# Patient Record
Sex: Female | Born: 1975
Health system: Southern US, Community
[De-identification: ages and names within clinical notes are randomized; demographics above are authoritative.]

## PROBLEM LIST (undated history)

## (undated) DIAGNOSIS — H539 Unspecified visual disturbance: Secondary | ICD-10-CM

## (undated) DIAGNOSIS — F419 Anxiety disorder, unspecified: Secondary | ICD-10-CM

## (undated) DIAGNOSIS — R519 Headache, unspecified: Secondary | ICD-10-CM

## (undated) DIAGNOSIS — G032 Benign recurrent meningitis [Mollaret]: Secondary | ICD-10-CM

## (undated) DIAGNOSIS — T7840XA Allergy, unspecified, initial encounter: Secondary | ICD-10-CM

## (undated) DIAGNOSIS — Z87898 Personal history of other specified conditions: Secondary | ICD-10-CM

## (undated) DIAGNOSIS — G8929 Other chronic pain: Secondary | ICD-10-CM

## (undated) DIAGNOSIS — S022XXA Fracture of nasal bones, initial encounter for closed fracture: Secondary | ICD-10-CM

## (undated) DIAGNOSIS — R51 Headache: Principal | ICD-10-CM

## (undated) DIAGNOSIS — D649 Anemia, unspecified: Secondary | ICD-10-CM

## (undated) DIAGNOSIS — A879 Viral meningitis, unspecified: Secondary | ICD-10-CM

## (undated) DIAGNOSIS — N83209 Unspecified ovarian cyst, unspecified side: Secondary | ICD-10-CM

## (undated) DIAGNOSIS — M1990 Unspecified osteoarthritis, unspecified site: Secondary | ICD-10-CM

## (undated) HISTORY — DX: Headache, unspecified: R51.9

## (undated) HISTORY — DX: Fracture of nasal bones, initial encounter for closed fracture: S02.2XXA

## (undated) HISTORY — DX: Unspecified osteoarthritis, unspecified site: M19.90

## (undated) HISTORY — DX: Unspecified ovarian cyst, unspecified side: N83.209

## (undated) HISTORY — PX: MOUTH SURGERY: SHX715

## (undated) HISTORY — PX: UTERINE ARTERY EMBOLIZATION: SHX2629

## (undated) HISTORY — DX: Anemia, unspecified: D64.9

## (undated) HISTORY — DX: Viral meningitis, unspecified: A87.9

## (undated) HISTORY — DX: Other chronic pain: G89.29

## (undated) HISTORY — PX: FOOT SURGERY: SHX648

## (undated) HISTORY — DX: Allergy, unspecified, initial encounter: T78.40XA

## (undated) HISTORY — DX: Anxiety disorder, unspecified: F41.9

## (undated) HISTORY — DX: Personal history of other specified conditions: Z87.898

## (undated) HISTORY — PX: SPINE SURGERY: SHX786

## (undated) HISTORY — DX: Headache: R51

## (undated) HISTORY — DX: Unspecified visual disturbance: H53.9

---
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
## 1996-04-03 HISTORY — PX: LAPAROSCOPY FOR ECTOPIC PREGNANCY: SUR765

## 1998-09-06 ENCOUNTER — Emergency Department (HOSPITAL_COMMUNITY): Admission: EM | Admit: 1998-09-06 | Discharge: 1998-09-06 | Payer: Self-pay | Admitting: Emergency Medicine

## 1998-12-08 ENCOUNTER — Emergency Department (HOSPITAL_COMMUNITY): Admission: EM | Admit: 1998-12-08 | Discharge: 1998-12-08 | Payer: Self-pay | Admitting: Emergency Medicine

## 1998-12-08 ENCOUNTER — Encounter: Payer: Self-pay | Admitting: Emergency Medicine

## 2000-01-18 ENCOUNTER — Other Ambulatory Visit: Admission: RE | Admit: 2000-01-18 | Discharge: 2000-01-18 | Payer: Self-pay | Admitting: *Deleted

## 2000-01-18 ENCOUNTER — Other Ambulatory Visit: Admission: RE | Admit: 2000-01-18 | Discharge: 2000-01-18 | Payer: Self-pay | Admitting: Family Medicine

## 2000-02-10 ENCOUNTER — Other Ambulatory Visit: Admission: RE | Admit: 2000-02-10 | Discharge: 2000-02-10 | Payer: Self-pay | Admitting: Family Medicine

## 2000-09-28 ENCOUNTER — Inpatient Hospital Stay (HOSPITAL_COMMUNITY): Admission: EM | Admit: 2000-09-28 | Discharge: 2000-10-03 | Payer: Self-pay | Admitting: Emergency Medicine

## 2000-09-28 ENCOUNTER — Encounter: Payer: Self-pay | Admitting: Emergency Medicine

## 2001-01-29 ENCOUNTER — Other Ambulatory Visit: Admission: RE | Admit: 2001-01-29 | Discharge: 2001-01-29 | Payer: Self-pay | Admitting: *Deleted

## 2001-05-26 ENCOUNTER — Emergency Department (HOSPITAL_COMMUNITY): Admission: EM | Admit: 2001-05-26 | Discharge: 2001-05-26 | Payer: Self-pay | Admitting: Emergency Medicine

## 2001-05-26 ENCOUNTER — Encounter: Payer: Self-pay | Admitting: Emergency Medicine

## 2002-09-02 ENCOUNTER — Other Ambulatory Visit: Admission: RE | Admit: 2002-09-02 | Discharge: 2002-09-02 | Payer: Self-pay | Admitting: *Deleted

## 2003-11-22 ENCOUNTER — Emergency Department (HOSPITAL_COMMUNITY): Admission: EM | Admit: 2003-11-22 | Discharge: 2003-11-22 | Payer: Self-pay | Admitting: Emergency Medicine

## 2003-12-15 ENCOUNTER — Other Ambulatory Visit: Admission: RE | Admit: 2003-12-15 | Discharge: 2003-12-15 | Payer: Self-pay | Admitting: *Deleted

## 2004-03-21 ENCOUNTER — Other Ambulatory Visit: Admission: RE | Admit: 2004-03-21 | Discharge: 2004-03-21 | Payer: Self-pay | Admitting: *Deleted

## 2004-05-26 ENCOUNTER — Emergency Department (HOSPITAL_COMMUNITY): Admission: EM | Admit: 2004-05-26 | Discharge: 2004-05-26 | Payer: Self-pay | Admitting: Emergency Medicine

## 2004-10-24 ENCOUNTER — Other Ambulatory Visit: Admission: RE | Admit: 2004-10-24 | Discharge: 2004-10-24 | Payer: Self-pay | Admitting: *Deleted

## 2006-11-07 ENCOUNTER — Other Ambulatory Visit: Admission: RE | Admit: 2006-11-07 | Discharge: 2006-11-07 | Payer: Self-pay | Admitting: *Deleted

## 2006-12-06 ENCOUNTER — Inpatient Hospital Stay (HOSPITAL_COMMUNITY): Admission: AD | Admit: 2006-12-06 | Discharge: 2006-12-06 | Payer: Self-pay | Admitting: *Deleted

## 2006-12-09 ENCOUNTER — Inpatient Hospital Stay (HOSPITAL_COMMUNITY): Admission: AD | Admit: 2006-12-09 | Discharge: 2006-12-09 | Payer: Self-pay | Admitting: *Deleted

## 2006-12-12 ENCOUNTER — Inpatient Hospital Stay (HOSPITAL_COMMUNITY): Admission: AD | Admit: 2006-12-12 | Discharge: 2006-12-12 | Payer: Self-pay | Admitting: *Deleted

## 2006-12-18 ENCOUNTER — Inpatient Hospital Stay (HOSPITAL_COMMUNITY): Admission: AD | Admit: 2006-12-18 | Discharge: 2006-12-18 | Payer: Self-pay | Admitting: *Deleted

## 2006-12-25 ENCOUNTER — Inpatient Hospital Stay (HOSPITAL_COMMUNITY): Admission: AD | Admit: 2006-12-25 | Discharge: 2006-12-25 | Payer: Self-pay | Admitting: *Deleted

## 2007-01-01 ENCOUNTER — Inpatient Hospital Stay (HOSPITAL_COMMUNITY): Admission: AD | Admit: 2007-01-01 | Discharge: 2007-01-01 | Payer: Self-pay | Admitting: *Deleted

## 2007-01-08 ENCOUNTER — Inpatient Hospital Stay (HOSPITAL_COMMUNITY): Admission: AD | Admit: 2007-01-08 | Discharge: 2007-01-08 | Payer: Self-pay | Admitting: *Deleted

## 2007-01-15 ENCOUNTER — Inpatient Hospital Stay (HOSPITAL_COMMUNITY): Admission: AD | Admit: 2007-01-15 | Discharge: 2007-01-15 | Payer: Self-pay | Admitting: *Deleted

## 2007-11-25 ENCOUNTER — Other Ambulatory Visit: Admission: RE | Admit: 2007-11-25 | Discharge: 2007-11-25 | Payer: Self-pay | Admitting: Gynecology

## 2008-02-04 ENCOUNTER — Encounter: Admission: RE | Admit: 2008-02-04 | Discharge: 2008-02-04 | Payer: Self-pay | Admitting: Obstetrics and Gynecology

## 2008-02-07 ENCOUNTER — Encounter: Admission: RE | Admit: 2008-02-07 | Discharge: 2008-02-07 | Payer: Self-pay | Admitting: Interventional Radiology

## 2008-02-24 ENCOUNTER — Ambulatory Visit (HOSPITAL_COMMUNITY): Admission: RE | Admit: 2008-02-24 | Discharge: 2008-02-25 | Payer: Self-pay | Admitting: Interventional Radiology

## 2008-03-03 ENCOUNTER — Encounter: Admission: RE | Admit: 2008-03-03 | Discharge: 2008-03-03 | Payer: Self-pay | Admitting: Interventional Radiology

## 2008-07-24 ENCOUNTER — Encounter: Admission: RE | Admit: 2008-07-24 | Discharge: 2008-07-24 | Payer: Self-pay | Admitting: Gynecology

## 2008-09-01 ENCOUNTER — Encounter: Admission: RE | Admit: 2008-09-01 | Discharge: 2008-09-01 | Payer: Self-pay | Admitting: Obstetrics and Gynecology

## 2008-09-01 ENCOUNTER — Encounter: Admission: RE | Admit: 2008-09-01 | Discharge: 2008-09-01 | Payer: Self-pay | Admitting: Interventional Radiology

## 2008-09-27 ENCOUNTER — Inpatient Hospital Stay (HOSPITAL_COMMUNITY): Admission: EM | Admit: 2008-09-27 | Discharge: 2008-09-30 | Payer: Self-pay | Admitting: Emergency Medicine

## 2008-09-28 ENCOUNTER — Ambulatory Visit: Payer: Self-pay | Admitting: Infectious Disease

## 2008-10-09 ENCOUNTER — Encounter: Admission: RE | Admit: 2008-10-09 | Discharge: 2008-10-09 | Payer: Self-pay | Admitting: Family Medicine

## 2009-01-01 ENCOUNTER — Encounter: Admission: RE | Admit: 2009-01-01 | Discharge: 2009-01-01 | Payer: Self-pay | Admitting: Gynecology

## 2009-04-03 DIAGNOSIS — S022XXA Fracture of nasal bones, initial encounter for closed fracture: Secondary | ICD-10-CM

## 2009-04-03 HISTORY — DX: Fracture of nasal bones, initial encounter for closed fracture: S02.2XXA

## 2009-09-08 ENCOUNTER — Ambulatory Visit (HOSPITAL_COMMUNITY): Admission: RE | Admit: 2009-09-08 | Discharge: 2009-09-08 | Payer: Self-pay | Admitting: *Deleted

## 2009-09-17 ENCOUNTER — Encounter: Admission: RE | Admit: 2009-09-17 | Discharge: 2009-09-17 | Payer: Self-pay | Admitting: Family Medicine

## 2010-04-24 ENCOUNTER — Encounter: Payer: Self-pay | Admitting: Interventional Radiology

## 2010-05-13 IMAGING — CT CT ABDOMEN W/ CM
2 of 4 series · 17 of 46 positions shown, 19 images · IV contrast (CONTRAST)
Comparison: None

CT ABDOMEN

CLINICAL DATA: Right lower quadrant pain for 4 days, fever

CT ABDOMEN AND PELVIS WITH CONTRAST
TECHNIQUE: Multidetector CT imaging of the abdomen and pelvis was
performed using the standard protocol following bolus
administration of intravenous contrast.
Contrast: 100 ml Smnipaque-RBB

[Series 2: portal · axial · portal-venous · 0.68mm/px · z∈[+1070,+1424]mm · 14 of 79 slices shown, 16 images]
[im 4/79  soft-tissue]
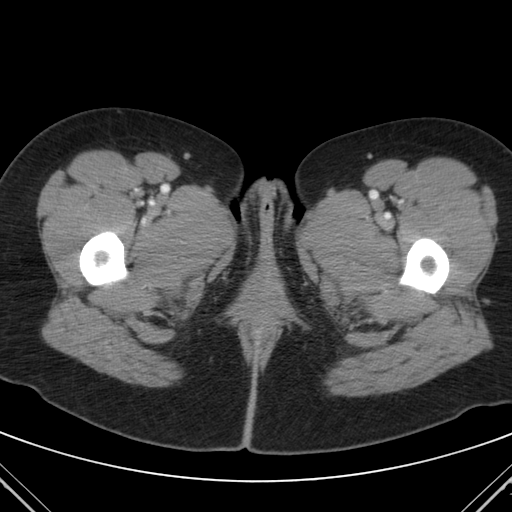
[im 4/79  bone]
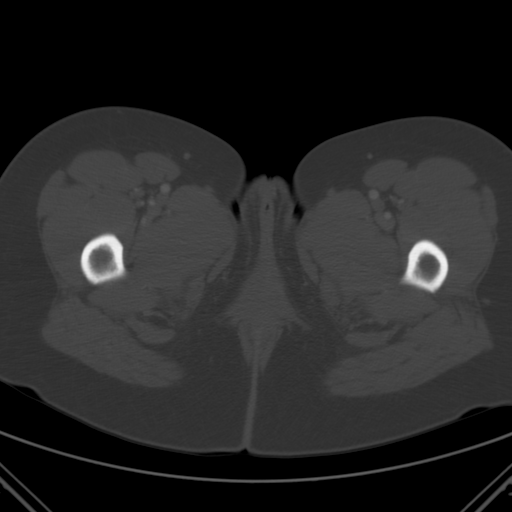
[im 10/79  soft-tissue]
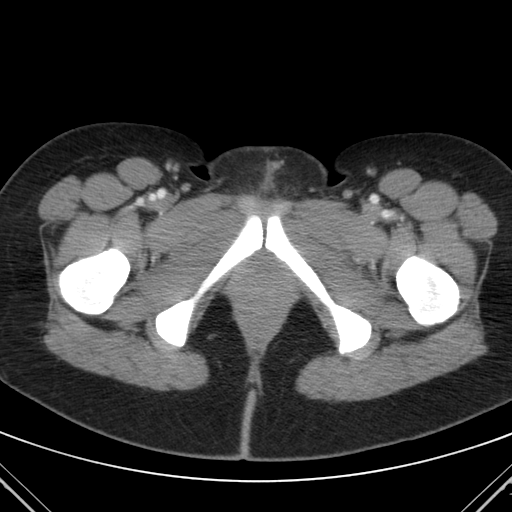
[im 16/79  soft-tissue]
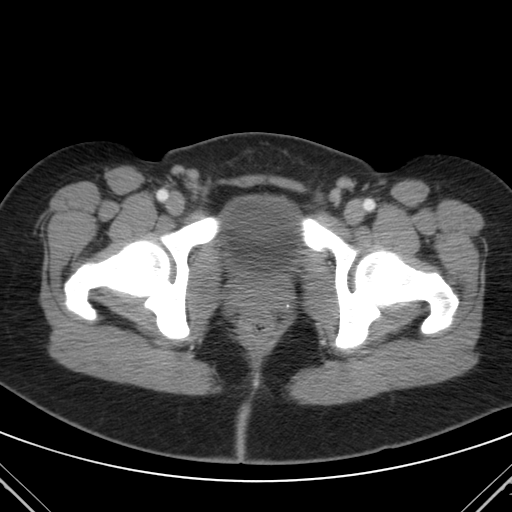
[im 22/79  soft-tissue]
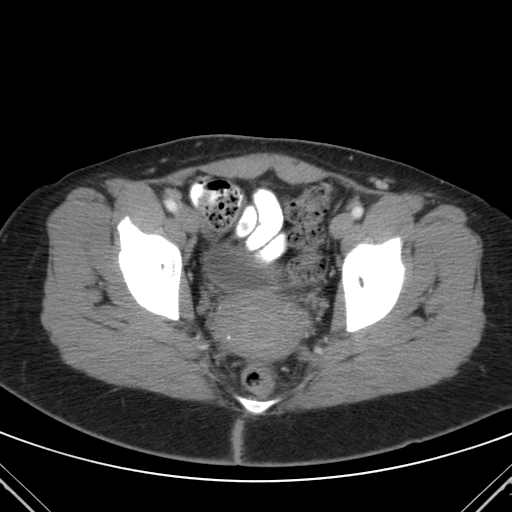
[im 25/79  soft-tissue]
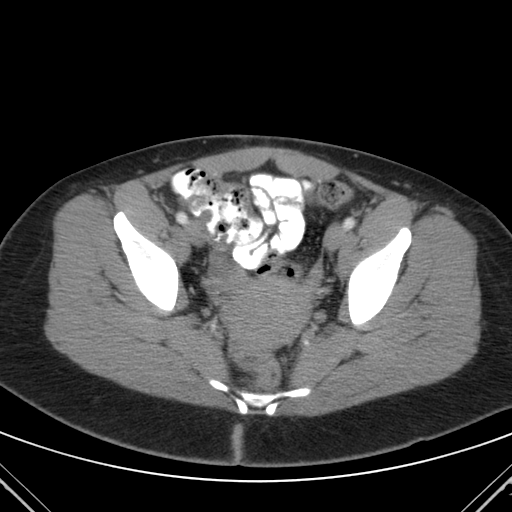
[im 32/79  soft-tissue]
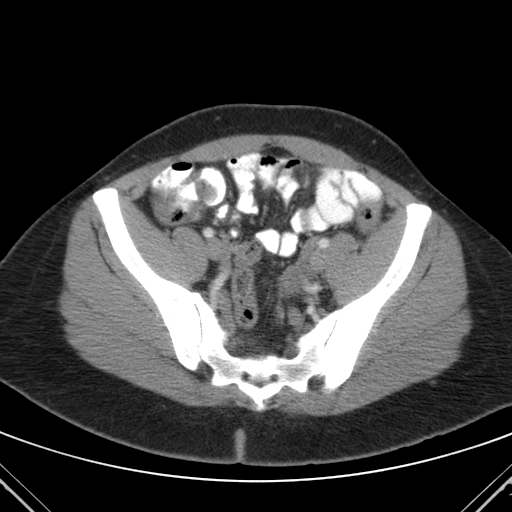
[im 38/79  soft-tissue]
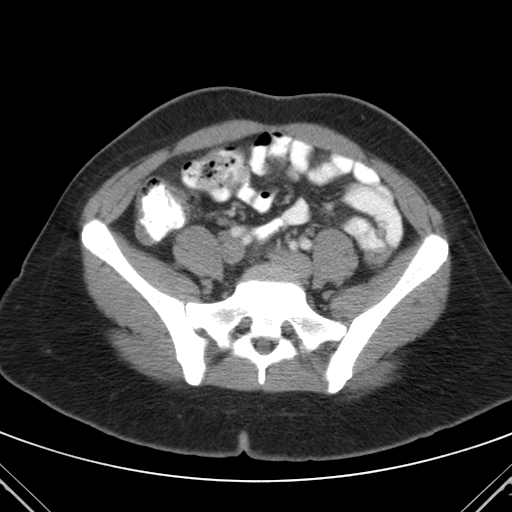
[im 41/79  soft-tissue]
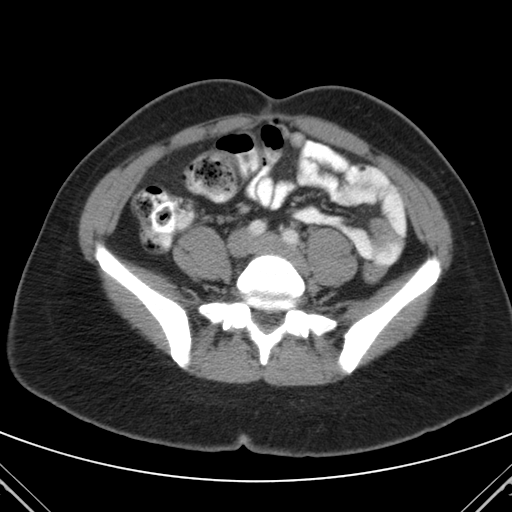
[im 47/79  soft-tissue]
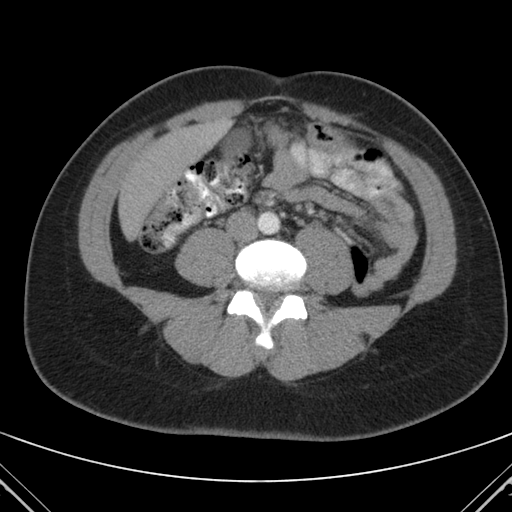
[im 47/79  bone]
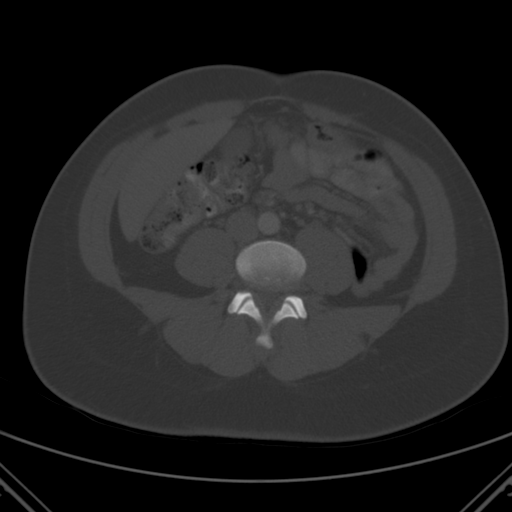
[im 54/79  soft-tissue]
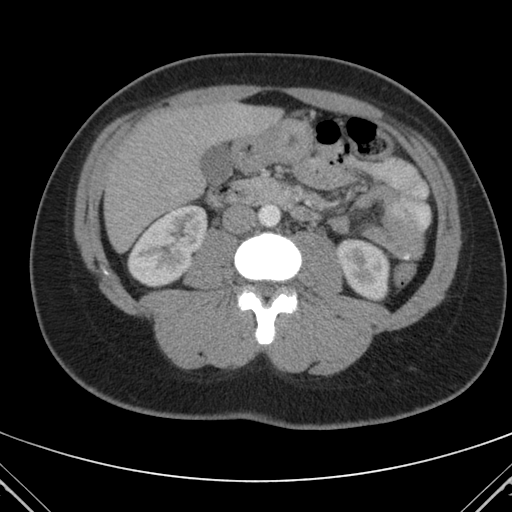
[im 60/79  soft-tissue]
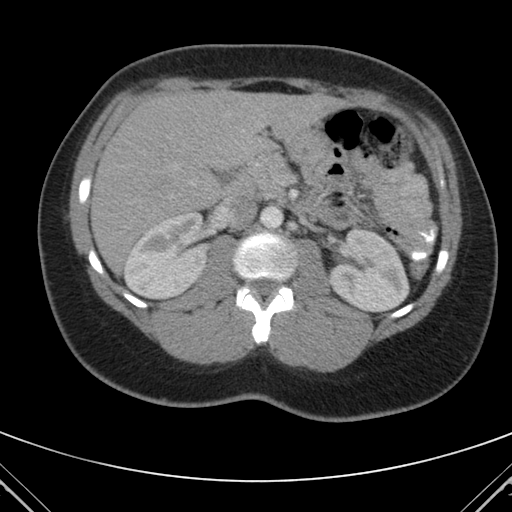
[im 63/79  soft-tissue]
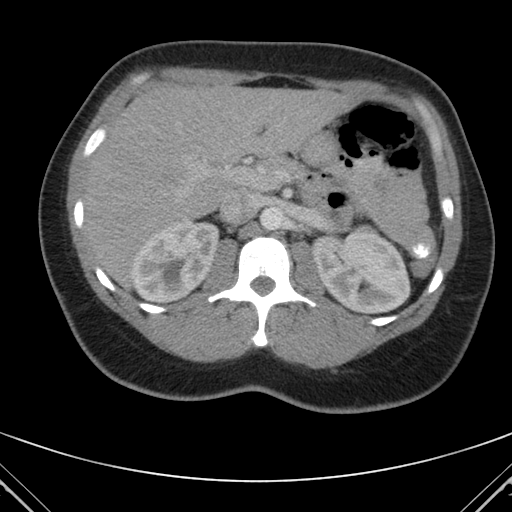
[im 69/79  soft-tissue]
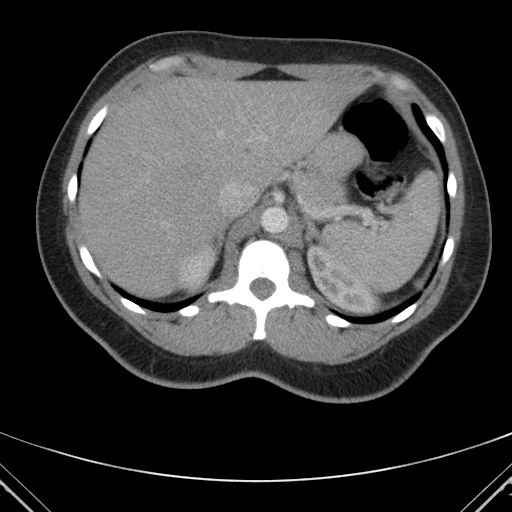
[im 75/79  soft-tissue]
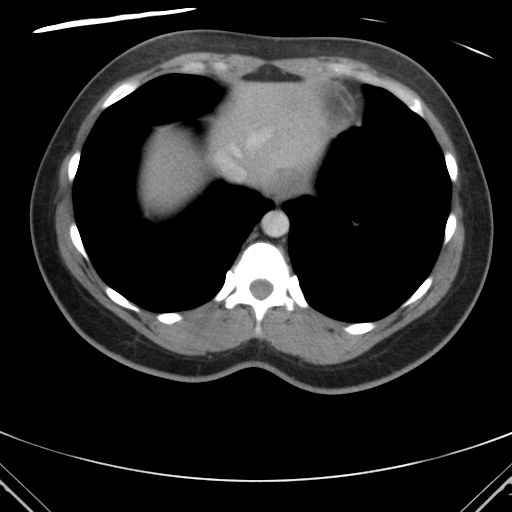

[coronals · coronal · 0.76mm/px · 3 of 83 slices shown]
[im 28/83  soft-tissue]
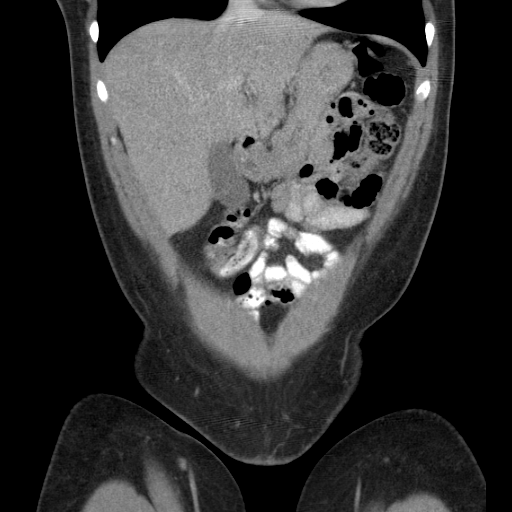
[im 37/83  soft-tissue]
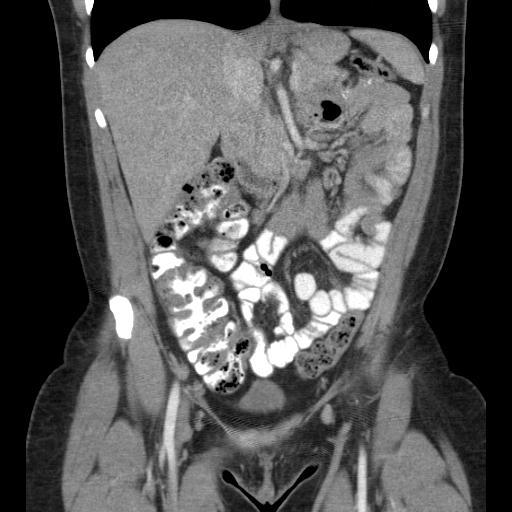
[im 46/83  soft-tissue]
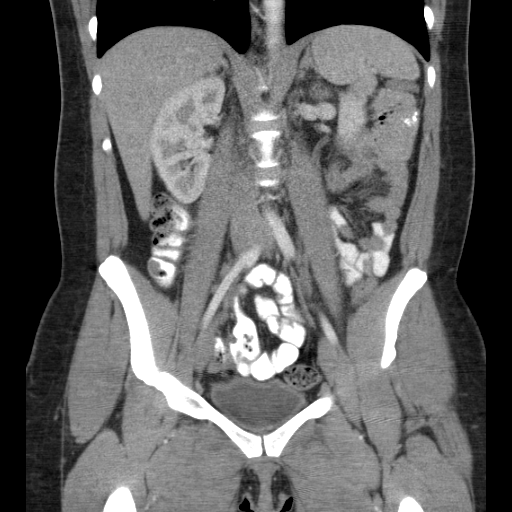

[17 of 46 positions shown; findings below may reference images not displayed]

FINDINGS: The lung bases are clear.  The liver enhances with no
focal abnormality and no ductal dilatation is seen.  No calcified
gallstones are noted.  The pancreas is normal in size and the
pancreatic duct is not dilated.  The adrenal glands and spleen
appear normal.  The kidneys enhance with no evidence of calculi,
mass, or hydronephrosis.  The abdominal aorta is normal in caliber.
The stomach is not distended and cannot be evaluated.
IMPRESSION: No significant abnormality on CT of the abdomen.

CT PELVIS
FINDINGS: The urinary bladder is unremarkable.  The uterus is
normal in size.  Small ovarian cysts are noted.  No free fluid is
seen within the pelvis.  The appendix fills with air and some
contrast and there is no CT evidence of appendicitis.  The terminal
ileum appears grossly normal. No bony abnormality is noted.
IMPRESSION: The appendix is well seen and appears normal.  The terminal ileum
is normal.  No acute abnormality is seen.  There are bilateral
ovarian cysts of no larger than 3 cm.

## 2010-07-11 LAB — URINE CULTURE
Colony Count: NO GROWTH
Culture: NO GROWTH

## 2010-07-11 LAB — CBC
HCT: 37.9 % (ref 36.0–46.0)
Hemoglobin: 11.8 g/dL — ABNORMAL LOW (ref 12.0–15.0)
Hemoglobin: 12.6 g/dL (ref 12.0–15.0)
MCHC: 33.2 g/dL (ref 30.0–36.0)
MCHC: 33.3 g/dL (ref 30.0–36.0)
MCV: 89.4 fL (ref 78.0–100.0)
MCV: 89.5 fL (ref 78.0–100.0)
Platelets: 286 10*3/uL (ref 150–400)
RBC: 3.97 MIL/uL (ref 3.87–5.11)
RBC: 4.23 MIL/uL (ref 3.87–5.11)
RDW: 12.9 % (ref 11.5–15.5)
RDW: 13 % (ref 11.5–15.5)
WBC: 6 10*3/uL (ref 4.0–10.5)

## 2010-07-11 LAB — CSF CELL COUNT WITH DIFFERENTIAL
Lymphs, CSF: 86 % — ABNORMAL HIGH (ref 40–80)
Lymphs, CSF: 87 % — ABNORMAL HIGH (ref 40–80)
Monocyte-Macrophage-Spinal Fluid: 12 % — ABNORMAL LOW (ref 15–45)
Monocyte-Macrophage-Spinal Fluid: 13 % — ABNORMAL LOW (ref 15–45)
RBC Count, CSF: 58 /mm3 — ABNORMAL HIGH
Segmented Neutrophils-CSF: 1 % (ref 0–6)
Tube #: 1
Tube #: 4

## 2010-07-11 LAB — POCT I-STAT, CHEM 8
BUN: 8 mg/dL (ref 6–23)
Calcium, Ion: 1.1 mmol/L — ABNORMAL LOW (ref 1.12–1.32)
Chloride: 107 mEq/L (ref 96–112)
Creatinine, Ser: 0.9 mg/dL (ref 0.4–1.2)
Glucose, Bld: 92 mg/dL (ref 70–99)
HCT: 39 % (ref 36.0–46.0)
Hemoglobin: 13.3 g/dL (ref 12.0–15.0)
Potassium: 4.2 mEq/L (ref 3.5–5.1)
Sodium: 138 mEq/L (ref 135–145)
TCO2: 23 mmol/L (ref 0–100)

## 2010-07-11 LAB — URINALYSIS, MICROSCOPIC ONLY
Glucose, UA: NEGATIVE mg/dL
Specific Gravity, Urine: 1.016 (ref 1.005–1.030)
Urobilinogen, UA: 1 mg/dL (ref 0.0–1.0)
pH: 6.5 (ref 5.0–8.0)

## 2010-07-11 LAB — DIFFERENTIAL
Basophils Absolute: 0.1 10*3/uL (ref 0.0–0.1)
Basophils Relative: 1 % (ref 0–1)
Eosinophils Absolute: 0 10*3/uL (ref 0.0–0.7)
Lymphocytes Relative: 12 % (ref 12–46)
Monocytes Absolute: 0.1 10*3/uL (ref 0.1–1.0)
Neutro Abs: 5.7 10*3/uL (ref 1.7–7.7)
Neutrophils Relative %: 85 % — ABNORMAL HIGH (ref 43–77)

## 2010-07-11 LAB — CSF CULTURE W GRAM STAIN

## 2010-07-11 LAB — CRYPTOCOCCAL ANTIGEN, CSF: Crypto Ag: NEGATIVE

## 2010-07-11 LAB — COMPREHENSIVE METABOLIC PANEL
ALT: 17 U/L (ref 0–35)
AST: 21 U/L (ref 0–37)
Albumin: 3.5 g/dL (ref 3.5–5.2)
Alkaline Phosphatase: 47 U/L (ref 39–117)
BUN: 5 mg/dL — ABNORMAL LOW (ref 6–23)
CO2: 23 mEq/L (ref 19–32)
Calcium: 8.5 mg/dL (ref 8.4–10.5)
Chloride: 108 mEq/L (ref 96–112)
Creatinine, Ser: 0.6 mg/dL (ref 0.4–1.2)
GFR calc Af Amer: >60 mL/min (ref >60)
GFR calc non Af Amer: >60 mL/min (ref >60)
Glucose, Bld: 135 mg/dL — ABNORMAL HIGH (ref 70–99)
Potassium: 4.1 mEq/L (ref 3.5–5.1)
Sodium: 137 mEq/L (ref 135–145)
Total Bilirubin: 1.1 mg/dL (ref 0.3–1.2)
Total Protein: 7.3 g/dL (ref 6.0–8.3)

## 2010-07-11 LAB — HSV PCR
HSV 2 , PCR: DETECTED
HSV, PCR: NOT DETECTED

## 2010-07-11 LAB — HIV ANTIBODY (ROUTINE TESTING W REFLEX): HIV: NONREACTIVE

## 2010-07-11 LAB — ROCKY MTN SPOTTED FVR AB, IGG-BLOOD: RMSF IgG: 0.74 IV

## 2010-07-11 LAB — AFB CULTURE WITH SMEAR (NOT AT ARMC): Acid Fast Smear: NONE SEEN

## 2010-07-11 LAB — ROCKY MTN SPOTTED FVR AB, IGM-BLOOD: RMSF IgM: 0.79 IV (ref 0.00–0.89)

## 2010-08-16 NOTE — Discharge Summary (Signed)
Angela Farrell, Angela Farrell               ACCOUNT NO.:  [id number]   MEDICAL RECORD NO.:  [id number]          PATIENT TYPE:  INP   LOCATION:  3039                         FACILITY:  [facility]   PHYSICIAN:  Hind I Elsaid, MD      DATE OF BIRTH:  [birth year]   DATE OF ADMISSION:  09/27/2008  DATE OF DISCHARGE:  09/30/2008                               DISCHARGE SUMMARY   DISCHARGE DIAGNOSES:  1. 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  2. History of fibroid.  3. History of pelvic inflammatory disease in the past.   DISCHARGE MEDICATIONS:  1. Valtrex 1 g p.o. t.i.d. for 11 days to complete 14 days of      treatment.  2. Ibuprofen 800 mg p.o. t.i.d. p.r.n.  3. Darvocet 1-2 tab p.o. q.6 h. p.r.n.   PROCEDURES:  1. CT head negative.  2. Status post lumbar puncture, which did show cell count of white      blood cells 370, RBC 58, lymphocyte of 87.  Total protein in CSF      104, glucose was 48.  Cryptococcal antigen was negative.  HIV      antibody was negative.  Culture of the CSF was negative, and [facility] spotted fever was negative.   HISTORY OF PRESENT ILLNESS:  This 35 year old African American female  [place] this weekend and coming back on  Saturday started to have headache, shaking chills, and eye pain, brought  to the emergency room, felt it could be secondary to meningitis, and the  patient had an LP, which did show lymphocytic meningitis.  The patient  had a history of viral meningitis in 2002.  The patient admitted with  recurrent meningitis, started on acute bacterial meningitis mainly  Rocephin and vancomycin, also started on Valtrex and doxycycline for  tick-borne disease.  Infectious Disease consulted for recurrent  meningitis, we are recommended to continue with Valtrex to cover her for  recurrent herpes meningitis.  At this time, we do not have the herpes  simplex report, and I did call myself the  lab, it will take 5-7 days.  The patient's symptoms improved.  The patient will be discharged with  Valtrex 1 g p.o. t.i.d. for a total of 14 days, and the patient asked to  follow up her report with her primary care.  The patient may need to  have a suppressive dose of Valtrex 500 mg p.o. daily if end up having  positive herpes simplex PCR.  At this time, we felt the patient is  medically stable to be discharged.  Follow with her primary care  physician next week.     Hind Bosie Helper, MD  Electronically Signed     Hind Bosie Helper, MD  Electronically Signed   HIE/MEDQ  D:  09/30/2008  T:  10/01/2008  Job:  [id number]

## 2010-08-16 NOTE — Consult Note (Signed)
Angela Farrell, Angela Farrell               ACCOUNT NO.:  [id number]   MEDICAL RECORD NO.:  [id number]          PATIENT TYPE:  INP   LOCATION:  3113                         FACILITY:  [facility]   PHYSICIAN:  Acey Lav, MD  DATE OF BIRTH:  12-May-1975   DATE OF CONSULTATION:  09/28/2008  DATE OF DISCHARGE:                                 CONSULTATION   REQUESTING PHYSICIAN:  Hind I Elsaid, MD with [facility].   REASON FOR INFECTIOUS DISEASE CONSULT:  Patient with lymphocytic  meningitis.   HISTORY OF PRESENT ILLNESS:  [name] is a 35 year old African  American lady with past medical history of viral meningitis diagnosed  at [facility] a number of years ago.  Of note, she did have a spinal  fluid culture done in February 2003 which failed to grow any organism.  I cannot find further data with regards to herpes simplex type PCRs or  other studies that were done on this spinal fluid back in 2003.  Nonetheless, she presented to the emergency room on the 27th due to  acute onset of fevers, shaking chills, photophobia and severe headaches  with neck stiffness.  In the emergency department apparently she had  been described as being lethargic.  She was however then given Versed  and sedation for her lumbar puncture, so when the hospitalist who  examined her saw her she seemed from the narcotics and anti-anxiety  medications.  She was febrile at 101.7 in the emergency room.  Her  lumbar puncture done before antibiotics were given showed a lymphocytic  pleocytosis with a cell count in tube #1 of 370 and in tube #4 of 565  with 87% lymphocytes in both tubes.  Red blood cells were 58 and 33 in  tubes #1 and #4 respectively.  Protein was 104, glucose was 48.  Her  simultaneous serum glucose was 92.  Her admission labs were notable for  normal cell count differential, platelets.  She had a spinal fluid  cryptococcal antigen done which was negative.  Her spinal fluid cultures  have been  negative to date.  She was placed on dexamethasone,  vancomycin, Rocephin, Acyclovir and doxicycline.  She was admitted to  the Neurosurgery intensive care unit.  She has been afebrile here in the  Neurosurgical ICU with a temperature maximum of 97.6 since admission.   The patient's medical history is significant only for fibroids and  uterine cysts.  She has tested negative for HIV in the past and been  tested for sexually transmitted diseases.  She does not know what the  cause of her aseptic meningitis was in 2003.  She was camping recently  in the mountains which was the reason why she was started on doxicycline  for possible [facility] spotted fever.  She does not note any tick  bites or has not had any rashes (although the latter finding is not  often found in patients with RMSF).   We are asked to assist and work up the patient with apparent aseptic  meningitis.  Of note, she is not known to be immunocompromised.  She has  not been ingesting unpasteurized cheeses or large amounts of hot dogs or  other processed meats (thus not putting herself at extremely high risk  for listerial meningitis).   PAST MEDICAL HISTORY:  1. Fibroids.  2. Prior history of aseptic meningitis.  3. Uterine cysts.  4. Pelvic inflammatory disease in the past.  5. Ectopic pregnancy.   PAST SURGICAL HISTORY:  She had an embolization for her bleeding from  her fibroids.   SOCIAL HISTORY:  No tobacco.  No illicit drug use.  She lives with her  boyfriend, has a child.   FAMILY HISTORY:  Noncontributory.   Also of note, the patient has had a prior laparotomy and bunionectomy.   REVIEW OF SYSTEMS:  As described in history of present illness.  Otherwise 10 point review of systems pertinent for nausea, headache,  photophobia, fevers, chills.  Otherwise negative on 10 point review of  systems.   PHYSICAL EXAMINATION:  Blood pressure 115/65, pulse 77, respirations 19,  pulse oximetry 100% on room  air.  Temperature maximum 97.6 since  admission to neurosurgical ICU.  GENERAL:  Quite pleasant lady in no acute distress.  HEENT:  Normocephalic, atraumatic.  Pupils equal, round, reactive to  light.  Sclerae anicteric.  Oropharynx clear.  CARDIOVASCULAR EXAM:  Regular rate, rhythm.  No murmurs, gallops or  rubs.  LUNGS:  Clear to auscultation bilaterally without wheezes, rhonchi or  rales.  ABDOMEN:  Soft, nondistended, nontender.  EXTREMITIES:  Without edema.  She has a few mosquito bites.  She does have some neck stiffness with flexing the neck.  Otherwise she  has a nonfocal neurological exam.   LABORATORY DATA:  1. CT of the head without contrast:  No bleed or acute intracranial      process.  2. Spinal fluid as described above.  3. CBC with differential today:  White count 6.0, hemoglobin 12.6,      platelets 286.  Comprehensive  metabolic panel:  Sodium 137,      chloride 108, bicarb 23, BUN and creatinine 5 and 0.6.  Glucose      135, bilirubin 1.1.  Transaminases normal.  4. Urinalysis showed a few bacteria, not clear why this was done since      she does not seem to have any symptoms consistent with urinary      tract infection.   IMPRESSION/RECOMMENDATIONS:  This is a 35 year old African American  female with past history of aseptic meningitis now with another  admission to the hospital with lymphocytic meningitis with most likely  cause being herpes simplex type 2.  Lymphocytic meningitis:  Most likely cause for this lymphocytic  meningitis is a viral meningitis with herpes simplex type 2 being the  most common cause in particular given her recurrent meningitis.  She had  meningitis in February 2003.  I would check a herpes simplex PCR on  spinal fluid.  She could have enterovirus although the management of  enterovirus will not be changed by diagnosing this so I would not get  the enterovirus PCR.  West Nile virus certainly could do this as well.  Again, this could  be done for epidemiological reasons but will not  affect her care so there is no reason to do this.  The patient is doing  well without having received antibiotics for Listeria.  I think Listeria  is less likely but it is a bacterial cause of lymphocytic meningitis.  I  do not think she has any evidence  of tuberculosis or fungal causes of  meningitis.  Those presentations are more subacute.  I would check her  for human immunodeficiency virus for healthcare screening purposes.   In the interim I am going to discontinue her Acyclovir and put her on  Valtrex 1 gram 3 times daily.  I would allow her to finish a 14 day  course of Valtrex presuming that this is herpes simplex type 2 or 1.  I  would stop her vancomycin, dexamethasone and Rocephin and as mentioned,  check herpes simplex PCR.  If she doing well, she could go home on  Valtrex 3 times daily as long as her laboratories are being followed up.  One could consider putting her on prophylactic Valtrex at 500 mg daily  to try to prevent recurrent meningitis.  This has not been well studied  or proven, but it certainly could be given to her to try and reduce  recurrent meningitis if in fact this is due to herpes simplex type 2  meningitis, otherwise known as Mollaret's meningitis.   Thank you for this consultation.  I will follow along closely.   ADDENDUM: HER HSV PCR ON CSF DID COME BACK POSITIVE FOR HSV 2      Acey Lav, MD  Electronically Signed     CV/MEDQ  D:  09/28/2008  T:  09/28/2008  Job:  [id number]

## 2010-08-16 NOTE — H&P (Signed)
NAMEAIMIE, WAGMAN NO.:  [id number]   MEDICAL RECORD NO.:  [id number]          PATIENT TYPE:  EMS   LOCATION:  MAJO                         FACILITY:  [facility]   PHYSICIAN:  Vania Rea, M.D. DATE OF BIRTH:  09/29/1975   DATE OF ADMISSION:  09/27/2008  DATE OF DISCHARGE:                              HISTORY & PHYSICAL   PRIMARY CARE PHYSICIAN:  Unassigned.   CHIEF COMPLAINT:  Headache, fever and chills.   HISTORY OF PRESENT ILLNESS:  This is a 35 year old obese African  American lady who denies any chronic medical problems, who went hiking  up into the mountains this weekend and coming back on Saturday started  to have a headache, shaking chills and eye pain.  She was brought to the  emergency room and was described as being lethargic by the emergency  room physician and there was a suspicion of meningitis, so the  hospitalist service was called to assist with management.  Despite the  patient being described as lethargic, the emergency room personnel say  they had the give her Versed to sedate her in order to get the lumbar  puncture done.  Consequently, the patient is currently now too sedated  to give a detailed personal history and the history is taken by  reviewing the records and talking with a friend who she has given  permission to give a history.  However, this limits the history.   The patient does have a past history of meningitis at a much earlier  age.  There is no history of working with young children or the very  elderly.  There is no history of a sore throat.  There is no history of  exposure to anybody with cough or acute or chronic illness.  On doing  mapping, there is no direct history of tick bites.   PAST MEDICAL HISTORY:  1. Past history of meningitis.  2. PID.  3. Ovarian cyst.  4. Anemia.  5. Ectopic pregnancy.   MEDICATIONS:  Iron sulfate and apparently Effexor.   ALLERGIES:  NO KNOWN DRUG ALLERGIES.   SOCIAL HISTORY:   Denies tobacco or illicit drug use.  She is an  occasional drinker.   FAMILY HISTORY:  Unable to obtain.   SURGICAL HISTORY:  1. Colposcopy.  2. Apparently laparotomy.  3. Bunionectomy.   REVIEW OF SYSTEMS:  Other than noted above unable to obtain.   PHYSICAL EXAMINATION:  GENERAL:  A drowsy and lethargic young African  American lady laying in bed.  VITAL SIGNS:  Temperature is 101.7 rectally, pulse is 84, respirations  18, blood pressure 129/72.  She is saturating 100% on 2 liters.  HEENT:  Her pupils are small, about 1-2 mm and they do appear to be  reactive.  She is not dehydrated.  NECK:  She has no cervical  lymphadenopathy or thyromegaly.  She does have neck stiffness.  CHEST:  Clear to auscultation bilaterally.  CARDIOVASCULAR:  Regular rhythm.  ABDOMEN:  Soft and nontender.  EXTREMITIES:  Without edema.  She has no bony joint deformity.  SKIN:  Free of rash or  ulcerations.  CENTRAL NERVOUS SYSTEM:  The exam is markedly limited, but there is no  obvious cranial nerve abnormality and although she is very lethargic and  unable to exert 100% effort, her strength is equal throughout.   LABORATORY DATA:  Her white count is 6.7, hemoglobin 11.8, MCV 89.4,  platelets 223.  She has 85% neutrophils.  Her absolute neutrophil count  is 5.7.  She does have vacuolated neutrophils on the smear.  An I-Stat  was done which showed a sodium of 138, potassium 4.2, chloride 107,  glucose 98, BUN 8, creatinine 0.9.  CF was done and tube #1 showed 370  white cells and 58 red cells.  Tube #4 showed 565 white cells and only 3  red cells, 1% neutrophils, 86% lymphocytes, 13% macrophages.  CSF  glucose was borderline low at 48 and CSF protein was elevated at 104.  No CT scan of the brain has been done.  No imaging studies have been  done.   ASSESSMENT:  1. Acute meningoencephalitis.  2. Chronic anemia.   PLAN:  Because of the acute onset despite lymphocytic pleocytosis, we  will go ahead and  treat for acute bacterial meningitis, but we will also  cover with acyclovir for herpes encephalitis and doxycycline for tick  born diseases.  Will send CSF for HSV-PCR, additionally, we will send  CSF for AFB smear and culture, as well as VDRL.  We will check an HIV  screen and we will check CSF cryptococcal antigen with this assistance  of infectious disease service for assistance with management.  Other  plans as per orders.      Vania Rea, M.D.  Electronically Signed     LC/MEDQ  D:  09/27/2008  T:  09/28/2008  Job:  [id number]   cc:   Fleeta Emmer, MD  Zadie Rhine, MD

## 2010-08-19 NOTE — Discharge Summary (Signed)
[place]  Patient:    [name], [name]                      MRN: [id number] Adm. Date:  [id number] Disc. Date: 10/03/00 Attending:  Jetty Duhamel T CC:         Vikki Ports, M.D.  Charles A. Tenny Craw, M.D.  Clovis Riley, N.P.   Discharge Summary  DATE OF BIRTH:  03-19-1976  DISCHARGE DIAGNOSES: 1. Viral meningitis. 2. Transient mild hydration. 3. Tobacco abuse. 4. Normocytic anemia.    a. Hemoglobin 10.0, mean corpuscular volume 84.    b. Work-up to be followed as an outpatient. 5. History of ectopic pregnancy in 1998.    a. Status post removal of fallopian tube.  DISCHARGE MEDICATIONS: 1. Ibuprofen 800 mg t.i.d. x 3 days and then p.r.n. 2. Darvocet-N 100 one tablet t.i.d. x 3 days and then as needed. 3. Pepcid 20 mg p.o. b.i.d. x 2 weeks.  DISCHARGE FOLLOW-UP AND DISPOSITION:  [name] is being discharged home. She will be followed by Clovis Riley, N.P., on Tuesday, October 09, 2000, in [facility].  On follow-up, smoking cessation is to be stressed and encouraged, so we recommend to decide about anemia work-up if the patients hemoglobin does not come back to normal range within two to three weeks.  CONSULTATIONS:  None.  PROCEDURES:  Lumbar puncture without complications.  DISCHARGE LABORATORY DATA:  Hemoglobin 10.7, MCV 84, WBC 5.4, platelets 285. Sodium 141, potassium 4.2, chloride 107, CO2 39, BUN 4, creatinine 0.7, glucose 93.  CSF analysis consistent with white cell count of 600, 94% neutrophils, and cultures and smears negative.  HISTORY OF PRESENT ILLNESS:  [name] is a very pleasant, 35 year old female admitted on September 28, 2000, in intractable headache, fever worrisome for viral meningitis.  Please see the admission H&P by Jetty Duhamel, M.D., for further details regarding the history of presentation, physical exam, and laboratory data.  HOSPITAL COURSE: #1 - VIRAL MENINGITIS:  Given the  clinical presentation, meningitis was worrisome.  A CT scan of the head was unremarkable.  A lumbar puncture was done under fluoroscopy by radiology.  The CSF analysis was consistent with classic viral meningitis.  The white cell count was elevated at 600 with 94% lymphocytes.  The patient was admitted to the hospital for close monitoring.  Supportive care was provided.  Due to the patients dehydration associated with decreased p.o. intake and fever, IV fluids were given to this patient.  With supportive care, the patients headache has started to improve very slowly, requiring a prolonged hospital stay.  On October 02, 2000, Darvocet was added three times a day around the clock.  By the next day, the patients headache was significantly improved.  By the time of discharge, the patient was afebrile and hemodynamically stable.  No evidence of meningitis was noted on the day of discharge.  #2 - NORMOCYTIC ANEMIA:  The admission hemoglobin was 12.0.  With IV hydration therapy, the patients hemoglobin came down to 10.0.  No evidence of bleeding was noticed.  No menorrhagia was described by Ms. Carmon.  The patient was started on Pepcid.  We recommend to follow the patients anemia as an outpatient before the work-up is required.  The patient was hemodynamically stable.  She did not have any evidence of bleeding throughout this hospital stay.  #3 - DEHYDRATION:  As described in problem #1, IV fluids were given to the patient for  supportive care and help with the patients dehydration.  At the time of discharge, no evidence of dehydration was noticed.  #4 - SMOKING:  Smoking cessation was addressed throughout this hospital stay. The patients primary care Clancy Mullarkey will continue encouraging smoking cessation.  [name] declined nicotine patch to help with withdrawal symptoms.  I spent about 36 minutes in the discharge process of this patient.  MEDICAL CONDITION AT THE TIME OF DISCHARGE:   Improved. DD:  10/03/00 TD:  10/03/00 Job: [id number]

## 2011-01-03 LAB — BASIC METABOLIC PANEL
Chloride: 107
GFR calc non Af Amer: >60
Glucose, Bld: 101 — ABNORMAL HIGH
Potassium: 3.8
Sodium: 137

## 2011-01-03 LAB — CBC
HCT: 34.4 — ABNORMAL LOW
Hemoglobin: 11.2 — ABNORMAL LOW
WBC: 5

## 2011-01-12 ENCOUNTER — Ambulatory Visit
Admission: RE | Admit: 2011-01-12 | Discharge: 2011-01-12 | Disposition: A | Discharge status: Home / Self Care | Payer: Self-pay | Source: Ambulatory Visit

## 2011-01-12 DIAGNOSIS — M25562 Pain in left knee: Secondary | ICD-10-CM

## 2011-01-12 DIAGNOSIS — M25561 Pain in right knee: Secondary | ICD-10-CM

## 2011-01-12 LAB — HCG, QUANTITATIVE, PREGNANCY
hCG, Beta Chain, Quant, S: 5 — ABNORMAL HIGH
hCG, Beta Chain, Quant, S: 137 — ABNORMAL HIGH
hCG, Beta Chain, Quant, S: 692 — ABNORMAL HIGH

## 2011-01-13 LAB — DIFFERENTIAL
Basophils Absolute: 0
Basophils Relative: 0
Eosinophils Absolute: 0.1
Lymphs Abs: 2
Monocytes Absolute: 0.8 — ABNORMAL HIGH
Neutro Abs: 5.4
Neutrophils Relative %: 65

## 2011-01-13 LAB — CREATININE, SERUM
Creatinine, Ser: 0.71
GFR calc Af Amer: >60

## 2011-01-13 LAB — CBC
MCHC: 33.9
Platelets: 336
RDW: 13.5

## 2011-01-13 LAB — HCG, QUANTITATIVE, PREGNANCY: hCG, Beta Chain, Quant, S: 1430 — ABNORMAL HIGH

## 2011-01-13 LAB — AST: AST: 20

## 2011-01-16 ENCOUNTER — Other Ambulatory Visit: Payer: Self-pay | Admitting: Occupational Medicine

## 2011-01-16 DIAGNOSIS — M25561 Pain in right knee: Secondary | ICD-10-CM

## 2011-04-18 ENCOUNTER — Encounter: Payer: Self-pay | Admitting: Family Medicine

## 2011-04-18 ENCOUNTER — Encounter (INDEPENDENT_AMBULATORY_CARE_PROVIDER_SITE_OTHER): Payer: 59 | Admitting: Family Medicine

## 2011-04-18 DIAGNOSIS — R519 Headache, unspecified: Secondary | ICD-10-CM

## 2011-04-18 DIAGNOSIS — Z0000 Encounter for general adult medical examination without abnormal findings: Secondary | ICD-10-CM

## 2011-04-18 DIAGNOSIS — J310 Chronic rhinitis: Secondary | ICD-10-CM | POA: Insufficient documentation

## 2011-04-18 DIAGNOSIS — R51 Headache: Secondary | ICD-10-CM

## 2011-04-18 DIAGNOSIS — E669 Obesity, unspecified: Secondary | ICD-10-CM | POA: Insufficient documentation

## 2011-06-21 ENCOUNTER — Ambulatory Visit: Payer: 59

## 2011-06-21 ENCOUNTER — Ambulatory Visit (INDEPENDENT_AMBULATORY_CARE_PROVIDER_SITE_OTHER): Payer: 59 | Admitting: Family Medicine

## 2011-06-21 VITALS — BP 140/83 | HR 91 | Temp 99.6°F | Resp 20 | Ht 63.5 in | Wt 214.0 lb

## 2011-06-21 DIAGNOSIS — M79645 Pain in left finger(s): Secondary | ICD-10-CM

## 2011-06-21 DIAGNOSIS — J301 Allergic rhinitis due to pollen: Secondary | ICD-10-CM

## 2011-06-21 DIAGNOSIS — M25549 Pain in joints of unspecified hand: Secondary | ICD-10-CM

## 2011-06-21 MED ORDER — AZELASTINE-FLUTICASONE 137-50 MCG/ACT NA SUSP: 1.0000 | Freq: Two times a day (BID) | NASAL | Status: DC | PRN

## 2011-06-21 MED ORDER — HYDROCODONE-ACETAMINOPHEN 5-325 MG PO TABS: 1.0000 | ORAL_TABLET | Freq: Four times a day (QID) | ORAL | Status: AC | PRN

## 2011-06-21 NOTE — Progress Notes (Signed)
  Subjective:    [name], female    DOB: 1975-09-29, 36 y.o.   MRN: [id number]  HPI Angela Farrell is a 36 y.o. female  L hand/thumb pain - over 1 month ago, toy hit knuckle of thumb,  Swelling next day.  Sore ever since. Swells at times Tx: ice. otc tylenol and ibuprofen - min relief.  No prior injury.  Ambidextrous, but write with R hand.  Allergies - chronic, with more congestion recently past few days. Usually clear, occasionally yellow. No fever,  Takes zyrtec otc.  Never used nasal spray.  GIS analyst - computer work/keyboard use.  Quit smoking last June with Chantix.  Review of Systems  HENT: Positive for congestion and rhinorrhea. Negative for hearing loss, ear pain and ear discharge.   Eyes: Positive for discharge.       Watery eyes.  Respiratory: Negative for cough, shortness of breath and wheezing.   Musculoskeletal: Positive for joint swelling and arthralgias.       Objective:   Physical Exam  Constitutional: She appears well-developed and well-nourished.  HENT:  Head: Normocephalic and atraumatic.  Nose: Mucosal edema present. No rhinorrhea or nasal deformity. Right sinus exhibits no maxillary sinus tenderness and no frontal sinus tenderness. Left sinus exhibits no maxillary sinus tenderness and no frontal sinus tenderness.  Eyes: Conjunctivae are normal. Pupils are equal, round, and reactive to light.  Cardiovascular: Normal rate, regular rhythm, normal heart sounds and intact distal pulses.   Pulmonary/Chest: Effort normal and breath sounds normal. She has no wheezes. She has no rales.  Musculoskeletal:       Hands: Skin: Skin is warm, dry and intact.    [facility] reading (PRIMARY) by  Dr. Neva Seat:  Small avulsion voalr thumb ip, with irregular area base of distal phalynx dorsally.    Assessment & Plan:  Angela Farrell is a 36 y.o. female 1. Pain of left thumb  DG Finger Thumb Left  2. Allergic rhinitis     L thumb possible subacute avulsion  injury.  Trial of splint x 1 week and XR overread.  May need hand pt vs ortho eval.  Tylenol otc prn, or if increased pain - lortab q6h prn.  Recheck 1 week.  Allergies - cont zyrtec, flonase ns prn ,and short term addition of Dymista NS - 1 per nost qd.  SED.  RTC precautions. If any fever, or worsening, rtc.

## 2011-06-21 NOTE — Patient Instructions (Signed)
Possible fracture of thumb joint - will splint short term until radiology reads x ray, and recheck in 1 week.  Tylenol as needed or if more severe pain, can take lortab up to every 6 hours.  Continue zyrtec for allergies, add dymista until symptoms improved.  Return to the clinic or go to the nearest emergency room if any of your symptoms worsen or new symptoms occur.

## 2011-06-24 ENCOUNTER — Other Ambulatory Visit: Payer: Self-pay | Admitting: *Deleted

## 2011-06-24 MED ORDER — FLUTICASONE PROPIONATE 50 MCG/ACT NA SUSP: 2.0000 | Freq: Every day | NASAL | Status: DC

## 2011-06-24 MED ORDER — AZELASTINE HCL 0.1 % NA SOLN: 2.0000 | Freq: Two times a day (BID) | NASAL | Status: DC

## 2011-06-27 ENCOUNTER — Encounter: Payer: Self-pay | Admitting: *Deleted

## 2011-06-27 ENCOUNTER — Ambulatory Visit: Payer: 59

## 2011-06-27 ENCOUNTER — Ambulatory Visit (INDEPENDENT_AMBULATORY_CARE_PROVIDER_SITE_OTHER): Payer: 59 | Admitting: Family Medicine

## 2011-06-27 VITALS — BP 136/78 | HR 112 | Temp 102.0°F | Resp 20 | Ht 63.5 in | Wt 213.2 lb

## 2011-06-27 DIAGNOSIS — R059 Cough, unspecified: Secondary | ICD-10-CM

## 2011-06-27 DIAGNOSIS — R05 Cough: Secondary | ICD-10-CM

## 2011-06-27 DIAGNOSIS — D649 Anemia, unspecified: Secondary | ICD-10-CM

## 2011-06-27 DIAGNOSIS — R0902 Hypoxemia: Secondary | ICD-10-CM

## 2011-06-27 DIAGNOSIS — R509 Fever, unspecified: Secondary | ICD-10-CM

## 2011-06-27 DIAGNOSIS — J9801 Acute bronchospasm: Secondary | ICD-10-CM

## 2011-06-27 LAB — POCT CBC
Hemoglobin: 11.6 g/dL — AB (ref 12.2–16.2)
Lymph, poc: 1.2 (ref 0.6–3.4)
MCH, POC: 26.7 pg — AB (ref 27–31.2)
MCHC: 31.3 g/dL — AB (ref 31.8–35.4)
MID (cbc): 0.5 (ref 0–0.9)
MPV: 9.4 fL (ref 0–99.8)
POC Granulocyte: 6.9 (ref 2–6.9)
POC LYMPH PERCENT: 14 %L (ref 10–50)
POC MID %: 5.4 %M (ref 0–12)
RDW, POC: 13.1 %
WBC: 8.6 10*3/uL (ref 4.6–10.2)

## 2011-06-27 MED ORDER — AZITHROMYCIN 500 MG PO TABS: 500.0000 mg | ORAL_TABLET | Freq: Every day | ORAL | Status: AC

## 2011-06-27 MED ORDER — ACETAMINOPHEN 325 MG PO TABS
500.0000 mg | ORAL_TABLET | Freq: Once | ORAL | Status: AC
  Administered 2011-06-27: 487.5 mg via ORAL

## 2011-06-27 MED ORDER — ALBUTEROL SULFATE HFA 108 (90 BASE) MCG/ACT IN AERS: 2.0000 | INHALATION_SPRAY | Freq: Four times a day (QID) | RESPIRATORY_TRACT | Status: DC | PRN

## 2011-06-27 MED ORDER — ALBUTEROL SULFATE (2.5 MG/3ML) 0.083% IN NEBU
2.5000 mg | INHALATION_SOLUTION | Freq: Once | RESPIRATORY_TRACT | Status: AC
  Administered 2011-06-27: 2.5 mg via RESPIRATORY_TRACT

## 2011-06-27 MED ORDER — PROMETHAZINE-DM 6.25-15 MG/5ML PO SYRP: ORAL_SOLUTION | ORAL | Status: DC

## 2011-06-27 NOTE — Progress Notes (Signed)
  Subjective:    [name], female    DOB: 1975-07-29, 36 y.o.   MRN: [id number]  HPI 36 yo AAF c/o 2-3d h/o cough and scratchy throat that became acutely worse today with body aches and fever.  Cough worsened and is painful.  She hasn't recently had any tylenol or advil.  She has felt minimal nausea.  Review of Systems     Objective:   Physical Exam  Nursing note and vitals reviewed. Constitutional: She is oriented to person, place, and time. She appears well-developed and well-nourished.       Covered in blanket.  Ill, but non-acute/non-toxic appearing  HENT:  Head: Normocephalic and atraumatic.  Mouth/Throat: Oropharynx is clear and moist. No oropharyngeal exudate.       TM B with fluid R>L.  Turbinates inflamed.  Neck: Normal range of motion. Neck supple.  Cardiovascular: Normal rate and regular rhythm.   Pulmonary/Chest: Effort normal. She has rales (L base).       Patient responded favorably to albuterol  Neurological: She is alert and oriented to person, place, and time.   [facility] reading (PRIMARY) by  Dr. Hal Hope as increased markings, no definite infiltrate.  Results for orders placed in visit on 06/27/11  POCT CBC      Component Value Range   WBC 8.6  4.6 - 10.2 (K/uL)   Lymph, poc 1.2  0.6 - 3.4    POC LYMPH PERCENT 14.0  10 - 50 (%L)   MID (cbc) 0.5  0 - 0.9    POC MID % 5.4  0 - 12 (%M)   POC Granulocyte 6.9  2 - 6.9    Granulocyte percent 80.6 (*) 37 - 80 (%G)   RBC 4.35  4.04 - 5.48 (M/uL)   Hemoglobin 11.6 (*) 12.2 - 16.2 (g/dL)   HCT, POC 40.9 (*) 81.1 - 47.9 (%)   MCV 85.4  80 - 97 (fL)   MCH, POC 26.7 (*) 27 - 31.2 (pg)   MCHC 31.3 (*) 31.8 - 35.4 (g/dL)   RDW, POC 91.4     Platelet Count, POC 302  142 - 424 (K/uL)   MPV 9.4  0 - 99.8 (fL)  POCT INFLUENZA A/B      Component Value Range   Influenza A, POC Negative     Influenza B, POC Negative          Assessment & Plan:  Cough and fever.  Cover for atypicals.  Zithromax,  albuterol, phenergan dm.  Recheck in 48hrs if not improving, sooner if worse.

## 2011-06-27 NOTE — Patient Instructions (Signed)
Fluids, rest

## 2011-06-29 ENCOUNTER — Telehealth: Payer: Self-pay

## 2011-06-29 NOTE — Telephone Encounter (Signed)
Pt called in for 2nd time he didn't understand why someone hasnt called him back and that it's been a couple hrs I Angela Farrell) advised him that he has to allow nurse or Dr 24-48 to return his call he stated pt is running a fever and there has been no change I Angela Farrell) stated that I was'nt able to give any type of medical info and that our office is open til 7pm and we reopen @ 730am

## 2011-06-29 NOTE — Telephone Encounter (Signed)
[name] STATES PT WAS TO CALL BACK IN 48 HRS, IF SHE HADN'T GOTTEN ANY BETTER AND SHE HASN'T. PLEASE CALL [phone]

## 2011-06-30 NOTE — Telephone Encounter (Signed)
Spoke with husband and he states pt feeling a little better today but I advised her to RTC if no better by tomorrow per Angela's note. The husband understood

## 2011-09-26 ENCOUNTER — Encounter (HOSPITAL_COMMUNITY): Payer: Self-pay | Admitting: Emergency Medicine

## 2011-09-26 ENCOUNTER — Emergency Department (HOSPITAL_COMMUNITY): Payer: 59

## 2011-09-26 ENCOUNTER — Inpatient Hospital Stay (HOSPITAL_COMMUNITY)
Admission: EM | Admit: 2011-09-26 | Discharge: 2011-09-29 | DRG: 076 | Disposition: A | Discharge status: Home / Self Care | Payer: 59 | Attending: Family Medicine | Admitting: Family Medicine

## 2011-09-26 DIAGNOSIS — E669 Obesity, unspecified: Secondary | ICD-10-CM | POA: Diagnosis present

## 2011-09-26 DIAGNOSIS — G8929 Other chronic pain: Secondary | ICD-10-CM | POA: Diagnosis present

## 2011-09-26 DIAGNOSIS — G039 Meningitis, unspecified: Secondary | ICD-10-CM

## 2011-09-26 DIAGNOSIS — G971 Other reaction to spinal and lumbar puncture: Secondary | ICD-10-CM

## 2011-09-26 DIAGNOSIS — B003 Herpesviral meningitis: Secondary | ICD-10-CM | POA: Diagnosis present

## 2011-09-26 DIAGNOSIS — J309 Allergic rhinitis, unspecified: Secondary | ICD-10-CM | POA: Diagnosis present

## 2011-09-26 DIAGNOSIS — A879 Viral meningitis, unspecified: Principal | ICD-10-CM | POA: Diagnosis present

## 2011-09-26 DIAGNOSIS — F411 Generalized anxiety disorder: Secondary | ICD-10-CM | POA: Diagnosis present

## 2011-09-26 DIAGNOSIS — R51 Headache: Secondary | ICD-10-CM | POA: Diagnosis present

## 2011-09-26 DIAGNOSIS — F329 Major depressive disorder, single episode, unspecified: Secondary | ICD-10-CM | POA: Diagnosis present

## 2011-09-26 DIAGNOSIS — F3289 Other specified depressive episodes: Secondary | ICD-10-CM | POA: Diagnosis present

## 2011-09-26 DIAGNOSIS — Z87891 Personal history of nicotine dependence: Secondary | ICD-10-CM

## 2011-09-26 LAB — CSF CELL COUNT WITH DIFFERENTIAL: Tube #: 3

## 2011-09-26 LAB — CBC
HCT: 36.3 % (ref 36.0–46.0)
Hemoglobin: 11.9 g/dL — ABNORMAL LOW (ref 12.0–15.0)
MCH: 27.8 pg (ref 26.0–34.0)
MCH: 28 pg (ref 26.0–34.0)
MCHC: 32.8 g/dL (ref 30.0–36.0)
MCHC: 32.8 g/dL (ref 30.0–36.0)
MCV: 84.8 fL (ref 78.0–100.0)
MCV: 85.4 fL (ref 78.0–100.0)
Platelets: 318 10*3/uL (ref 150–400)
RBC: 4.21 MIL/uL (ref 3.87–5.11)
RBC: 4.25 MIL/uL (ref 3.87–5.11)

## 2011-09-26 LAB — BASIC METABOLIC PANEL
CO2: 23 mEq/L (ref 19–32)
Calcium: 9 mg/dL (ref 8.4–10.5)
Creatinine, Ser: 0.69 mg/dL (ref 0.50–1.10)
GFR calc non Af Amer: >90 mL/min (ref >90)
Glucose, Bld: 110 mg/dL — ABNORMAL HIGH (ref 70–99)

## 2011-09-26 LAB — RPR: RPR Ser Ql: NONREACTIVE

## 2011-09-26 LAB — PROTEIN AND GLUCOSE, CSF: Glucose, CSF: 56 mg/dL (ref 43–76)

## 2011-09-26 LAB — HIV ANTIBODY (ROUTINE TESTING W REFLEX): HIV: NONREACTIVE

## 2011-09-26 MED ORDER — HYDROMORPHONE HCL PF 1 MG/ML IJ SOLN
1.0000 mg | INTRAMUSCULAR | Status: DC | PRN
  Administered 2011-09-26 – 2011-09-27 (×7): 1 mg via INTRAVENOUS
  Filled 2011-09-26 (×7): qty 1

## 2011-09-26 MED ORDER — METOCLOPRAMIDE HCL 5 MG/ML IJ SOLN
10.0000 mg | Freq: Once | INTRAMUSCULAR | Status: AC
  Administered 2011-09-26: 10 mg via INTRAVENOUS
  Filled 2011-09-26: qty 2

## 2011-09-26 MED ORDER — DEXTROSE 5 % IV SOLN
600.0000 mg | Freq: Once | INTRAVENOUS | Status: DC
  Administered 2011-09-26: 600 mg via INTRAVENOUS
  Filled 2011-09-26: qty 12

## 2011-09-26 MED ORDER — SODIUM CHLORIDE 0.9 % IV BOLUS (SEPSIS)
1000.0000 mL | Freq: Once | INTRAVENOUS | Status: AC
  Administered 2011-09-26: 1000 mL via INTRAVENOUS

## 2011-09-26 MED ORDER — DEXTROSE 5 % IV SOLN
2.0000 g | Freq: Two times a day (BID) | INTRAVENOUS | Status: DC
  Administered 2011-09-26 (×2): 2 g via INTRAVENOUS
  Filled 2011-09-26 (×4): qty 2

## 2011-09-26 MED ORDER — ACETAMINOPHEN 650 MG RE SUPP: 650.0000 mg | Freq: Four times a day (QID) | RECTAL | Status: DC | PRN

## 2011-09-26 MED ORDER — ENOXAPARIN SODIUM 40 MG/0.4ML ~~LOC~~ SOLN
40.0000 mg | SUBCUTANEOUS | Status: DC
  Administered 2011-09-26 – 2011-09-29 (×4): 40 mg via SUBCUTANEOUS
  Filled 2011-09-26 (×4): qty 0.4

## 2011-09-26 MED ORDER — ONDANSETRON HCL 4 MG/2ML IJ SOLN
4.0000 mg | Freq: Four times a day (QID) | INTRAMUSCULAR | Status: DC | PRN
  Administered 2011-09-28: 4 mg via INTRAVENOUS
  Filled 2011-09-26: qty 2

## 2011-09-26 MED ORDER — OXYCODONE-ACETAMINOPHEN 5-325 MG PO TABS
1.0000 | ORAL_TABLET | Freq: Four times a day (QID) | ORAL | Status: DC | PRN
  Administered 2011-09-26 – 2011-09-29 (×7): 1 via ORAL
  Filled 2011-09-26 (×7): qty 1

## 2011-09-26 MED ORDER — DIPHENHYDRAMINE HCL 50 MG/ML IJ SOLN
25.0000 mg | Freq: Once | INTRAMUSCULAR | Status: AC
  Administered 2011-09-26: 25 mg via INTRAVENOUS
  Filled 2011-09-26: qty 1

## 2011-09-26 MED ORDER — DEXTROSE 5 % IV SOLN
600.0000 mg | Freq: Three times a day (TID) | INTRAVENOUS | Status: DC
  Filled 2011-09-26 (×2): qty 12

## 2011-09-26 MED ORDER — SODIUM CHLORIDE 0.9 % IV SOLN
INTRAVENOUS | Status: DC
  Administered 2011-09-26: 14:00:00 via INTRAVENOUS

## 2011-09-26 MED ORDER — DEXTROSE 5 % IV SOLN
600.0000 mg | Freq: Three times a day (TID) | INTRAVENOUS | Status: DC
  Administered 2011-09-26 – 2011-09-27 (×2): 600 mg via INTRAVENOUS
  Filled 2011-09-26 (×4): qty 12

## 2011-09-26 MED ORDER — DEXTROSE 5 % IV SOLN
1.0000 g | Freq: Once | INTRAVENOUS | Status: AC
  Administered 2011-09-26: 1 g via INTRAVENOUS
  Filled 2011-09-26: qty 10

## 2011-09-26 MED ORDER — ACETAMINOPHEN 325 MG PO TABS
ORAL_TABLET | ORAL | Status: AC
  Administered 2011-09-26: 650 mg via ORAL
  Filled 2011-09-26: qty 2

## 2011-09-26 MED ORDER — ACETAMINOPHEN 325 MG PO TABS
650.0000 mg | ORAL_TABLET | Freq: Four times a day (QID) | ORAL | Status: DC | PRN
  Administered 2011-09-26: 650 mg via ORAL
  Administered 2011-09-26: 325 mg via ORAL
  Administered 2011-09-26: 650 mg via ORAL
  Administered 2011-09-27: 325 mg via ORAL
  Administered 2011-09-27 – 2011-09-28 (×2): 650 mg via ORAL
  Filled 2011-09-26: qty 1
  Filled 2011-09-26: qty 2
  Filled 2011-09-26: qty 1
  Filled 2011-09-26 (×2): qty 2

## 2011-09-26 MED ORDER — DEXAMETHASONE SODIUM PHOSPHATE 4 MG/ML IJ SOLN
10.0000 mg | Freq: Once | INTRAMUSCULAR | Status: AC
  Administered 2011-09-26: 10 mg via INTRAVENOUS
  Filled 2011-09-26: qty 3

## 2011-09-26 MED ORDER — ONDANSETRON HCL 4 MG PO TABS: 4.0000 mg | ORAL_TABLET | Freq: Four times a day (QID) | ORAL | Status: DC | PRN

## 2011-09-26 MED ORDER — VANCOMYCIN HCL IN DEXTROSE 1-5 GM/200ML-% IV SOLN
1000.0000 mg | Freq: Once | INTRAVENOUS | Status: AC
  Administered 2011-09-26: 1000 mg via INTRAVENOUS
  Filled 2011-09-26: qty 200

## 2011-09-26 NOTE — Consult Note (Addendum)
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 female with history of recurrent viral meningits presents to ED with 1 day history of fever, headache, nuchal rigidity. Underwent LP which found a lymphocytic pleocytosis with CSF WBC count of 590, 76 % lymphocyte, 2% segs,  elevated protein 178 and normal glucose at 56, and 11 RBC. She states that this is not has bad as in the past. She had some photophobia, but mainly pain behind her eyes, some mild nausea, and no vomiting. Her last episode was in 2010, where she had similar CSF profile, CSF + HSV 2 pcr. She was unaware that HSV-2 was identified since she has never had any cold sores, genital sores associated with herpes. She states that after having her LP and starting on medications she is feeling better. She still having some neck pain but improved. Pain medications is helping her headache. No longer having fevers.  She states that she had increase stress last week since her boyfriend suffered a heart attack. Roughly 17 days ago, they were camping in [place] where there were several bugs, unsure if had a tick bite. No noticeable rash.  Past Medical History  Diagnosis Date  . Anxiety   . Meningitis, viral At age 58 and at age 19  . Other and unspecified ovarian cysts 2011- pelvic CT    GYN- Dr. Chevis Pretty  . Anemia   . Depression   . Chronic headache disorder     s/p Meningitis x 2, tension- type plus allergic/sinus problems  . History of abnormal Pap smear   . Nasal fracture 2011    Allergies: No Known Allergies  Current antibiotics: Acyclovir ceftriaxone  MEDICATIONS:    . acyclovir  600 mg Intravenous Q8H  . cefTRIAXone (ROCEPHIN)  IV  1 g Intravenous Once  . cefTRIAXone (ROCEPHIN)  IV  2 g Intravenous Q12H  . dexamethasone  10 mg Intravenous Once  . diphenhydrAMINE  25 mg Intravenous Once  . enoxaparin  40 mg  Subcutaneous Q24H  . metoCLOPramide (REGLAN) injection  10 mg Intravenous Once  . sodium chloride  1,000 mL Intravenous Once  . vancomycin  1,000 mg Intravenous Once  . DISCONTD: acyclovir  600 mg Intravenous Once  . DISCONTD: acyclovir  600 mg Intravenous Q8H    History  Substance Use Topics  . Smoking status: Former Games developer  . Smokeless tobacco: Not on file  . Alcohol Use: Yes    Family History  Problem Relation Age of Onset  . Migraines Father     cluster headache  . Cancer Mother   . Diabetes      paternal relaitve  . Seizures      maternal relative  . Rheum arthritis      maternal relatives     Review of Systems  Constitutional: positive for fever x 1 day. But negative for:, chills, diaphoresis, activity change, appetite change, fatigue and unexpected weight change.  HENT: Negative for congestion, sore throat, rhinorrhea, sneezing, trouble swallowing and sinus pressure.  Eyes: Negative for photophobia and visual disturbance.  Respiratory: Negative for cough, chest tightness, shortness of breath, wheezing and stridor.  Cardiovascular: Negative for chest pain, palpitations and leg swelling.  Gastrointestinal: Negative for nausea, vomiting, abdominal pain, diarrhea, constipation, blood in stool, abdominal distention and anal bleeding.  Genitourinary: Negative for dysuria, hematuria, flank pain and difficulty urinating.  Musculoskeletal: Negative for myalgias, back pain, joint  swelling, arthralgias and gait problem.  Skin: Negative for color change, pallor, rash and wound.  Neurological: per hpi.  Hematological: Negative for adenopathy. Does not bruise/bleed easily.  Psychiatric/Behavioral: Negative for behavioral problems, confusion, sleep disturbance, dysphoric mood, decreased concentration and agitation.     OBJECTIVE: Temp:  [98 F (36.7 C)-98.5 F (36.9 C)] 98.5 F (36.9 C) (06/25 1508) Pulse Rate:  [82-103] 98  (06/25 1508) Resp:  [18] 18  (06/25 1508) BP:  (98-126)/(58-83) 108/63 mmHg (06/25 1508) SpO2:  [95 %-98 %] 97 % (06/25 1508)   General Appearance:    Alert, cooperative, no distress, appears stated age  Head:    Normocephalic, without obvious abnormality, atraumatic  Eyes:    PERRL, conjunctiva/corneas clear, EOM's intact, fundi    benign, both eyes        Throat:   Lips, mucosa, and tongue normal; teeth and gums normal  Neck:   Supple, symmetrical, trachea midline, no adenopathy;  Still mild nuchal rigidity     Lungs:     Clear to auscultation bilaterally, respirations unlabored      Heart:    Regular rate and rhythm, S1 and S2 normal, no murmur, rub   or gallop     Abdomen:     Soft, non-tender, bowel sounds active all four quadrants,    no masses, no organomegaly        Extremities:   Extremities normal, atraumatic, no cyanosis or edema  Pulses:   2+ and symmetric all extremities  Skin:   Skin color, texture, turgor normal, no rashes or lesions  Lymph nodes:   Cervical, supraclavicular, and axillary nodes normal  Neurologic:   CNII-XII intact, normal strength, sensation and reflexes    throughout   LABS: Results for orders placed during the hospital encounter of 09/26/11 (from the past 48 hour(s))  CBC     Status: Abnormal   Collection Time   09/26/11  1:15 AM      Component Value Range Comment   WBC 10.1  4.0 - 10.5 K/uL    RBC 4.21  3.87 - 5.11 MIL/uL    Hemoglobin 11.7 (*) 12.0 - 15.0 g/dL    HCT 09.8 (*) 11.9 - 46.0 %    MCV 84.8  78.0 - 100.0 fL    MCH 27.8  26.0 - 34.0 pg    MCHC 32.8  30.0 - 36.0 g/dL    RDW 14.7  82.9 - 56.2 %    Platelets 318  150 - 400 K/uL   BASIC METABOLIC PANEL     Status: Abnormal   Collection Time   09/26/11  1:15 AM      Component Value Range Comment   Sodium 134 (*) 135 - 145 mEq/L    Potassium 3.8  3.5 - 5.1 mEq/L    Chloride 100  96 - 112 mEq/L    CO2 23  19 - 32 mEq/L    Glucose, Bld 110 (*) 70 - 99 mg/dL    BUN 8  6 - 23 mg/dL    Creatinine, Ser 1.30  0.50 - 1.10 mg/dL     Calcium 9.0  8.4 - 10.5 mg/dL    GFR calc non Af Amer >90  >90 mL/min    GFR calc Af Amer >90  >90 mL/min   CSF CELL COUNT WITH DIFFERENTIAL     Status: Abnormal   Collection Time   09/26/11  3:10 AM      Component Value Range Comment  Tube # 3      Color, CSF COLORLESS  COLORLESS    Appearance, CSF CLEAR  CLEAR    Supernatant NOT INDICATED      RBC Count, CSF 11 (*) 0 /cu mm    WBC, CSF 590 (*) 0 - 5 /cu mm    Segmented Neutrophils-CSF 2  0 - 6 %    Lymphs, CSF 76  40 - 80 %    Monocyte-Macrophage-Spinal Fluid 22  15 - 45 %   CSF CULTURE     Status: Normal (Preliminary result)   Collection Time   09/26/11  3:10 AM      Component Value Range Comment   Specimen Description CSF      Special Requests NONE      Gram Stain        Value: CYTOSPIN WBC PRESENT,BOTH PMN AND MONONUCLEAR     NO ORGANISMS SEEN     Performed at [facility]   Culture NO GROWTH      Report Status PENDING     PROTEIN AND GLUCOSE, CSF     Status: Abnormal   Collection Time   09/26/11  3:10 AM      Component Value Range Comment   Glucose, CSF 56  43 - 76 mg/dL    Total  Protein, CSF 178 (*) 15 - 45 mg/dL   PATHOLOGIST SMEAR REVIEW     Status: Normal   Collection Time   09/26/11  3:10 AM      Component Value Range Comment   Tech Review Increased cells, predominately normal     GRAM STAIN     Status: Normal   Collection Time   09/26/11  3:12 AM      Component Value Range Comment   Specimen Description CSF      Special Requests NONE      Gram Stain        Value: NO ORGANISMS SEEN     WBC PRESENT,BOTH PMN AND MONONUCLEAR     CYTOSPUN   Report Status 09/26/2011 FINAL     HIV ANTIBODY (ROUTINE TESTING)     Status: Normal   Collection Time   09/26/11 10:27 AM      Component Value Range Comment   HIV NON REACTIVE  NON REACTIVE   CBC     Status: Abnormal   Collection Time   09/26/11  1:35 PM      Component Value Range Comment   WBC 9.9  4.0 - 10.5 K/uL    RBC 4.25  3.87 - 5.11 MIL/uL     Hemoglobin 11.9 (*) 12.0 - 15.0 g/dL    HCT 16.1  09.6 - 04.5 %    MCV 85.4  78.0 - 100.0 fL    MCH 28.0  26.0 - 34.0 pg    MCHC 32.8  30.0 - 36.0 g/dL    RDW 40.9  81.1 - 91.4 %    Platelets 201  150 - 400 K/uL DELTA CHECK NOTED  CREATININE, SERUM     Status: Normal   Collection Time   09/26/11  1:35 PM      Component Value Range Comment   Creatinine, Ser 0.58  0.50 - 1.10 mg/dL    GFR calc non Af Amer >90  >90 mL/min    GFR calc Af Amer >90  >90 mL/min     MICRO:  IMAGING: Ct Head Wo Contrast  09/26/2011  *RADIOLOGY REPORT*  Clinical Data: 36 year old female with headache  pain body aches fever.  History of meningitis several years ago.  CT HEAD WITHOUT CONTRAST  Technique:  Contiguous axial images were obtained from the base of the skull through the vertex without contrast.  Comparison: Head CT 09/28/2008.  Findings: Visualized paranasal sinuses and mastoids are clear. Visualized orbits and scalp soft tissues are within normal limits. No acute osseous abnormality identified.  The calf Cerebral volume is within normal limits for age.  No midline shift, ventriculomegaly, mass effect, evidence of mass lesion, intracranial hemorrhage or evidence of cortically based acute infarction.  Gray-white matter differentiation is within normal limits throughout the brain.  No suspicious intracranial vascular hyperdensity.  IMPRESSION: Stable and normal noncontrast CT appearance of the brain.  Original Report Authenticated By: Harley Hallmark, M.D.    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  - will see patient back in ID clinic in 4 wks to discuss if she wants to start antivirals suppressive therapy   Jebadiah Imperato B. Drue Second MD MPH [facility] [phone]

## 2011-09-26 NOTE — ED Provider Notes (Signed)
History     CSN: [id number]  Arrival date & time 09/26/11  0018   First MD Initiated Contact with Patient 09/26/11 0103      Chief Complaint  Patient presents with  . Muscle Pain    generalized body pain  . Headache  . Neck Pain    (Consider location/radiation/quality/duration/timing/severity/associated sxs/prior treatment) HPI History provided by patient. Generalized body aches with fever, headache and neck stiffness started today. Patient has remote history of viral meningitis but states her headache was not this bad. No recent illness otherwise. No tick bites or rash. Patient was camping over the weekend and believes she may have been bit by a mosquito. No syncope or seizures. No change in vision. Some photophobia. Headache is generalized and throbbing in moderate to severe. Symptoms worse with any movement. measured fever prior to arrival. Past Medical History  Diagnosis Date  . Anxiety   . Meningitis, viral At age 56 and at age 17  . Other and unspecified ovarian cysts 2011- pelvic CT    GYN- Dr. Chevis Pretty  . Anemia   . Depression   . Chronic headache disorder     s/p Meningitis x 2, tension- type plus allergic/sinus problems  . History of abnormal Pap smear   . Nasal fracture 2011    Past Surgical History  Procedure Date  . Foot surgery s/p 2 procedures  . Laparoscopy for ectopic pregnancy 1998    Fallopian tube removed  . Uterine artery embolization     Family History  Problem Relation Age of Onset  . Migraines Father     cluster headache  . Cancer Mother   . Diabetes      paternal relaitve  . Seizures      maternal relative  . Rheum arthritis      maternal relatives    History  Substance Use Topics  . Smoking status: Former Games developer  . Smokeless tobacco: Not on file  . Alcohol Use: Yes    OB History    Grav Para Term Preterm Abortions TAB SAB Ect Mult Living                  Review of Systems  Constitutional: Positive for chills. Negative for  fever.  HENT: Positive for neck pain and neck stiffness.   Eyes: Positive for photophobia. Negative for pain.  Respiratory: Negative for shortness of breath.   Cardiovascular: Negative for chest pain.  Gastrointestinal: Negative for abdominal pain.  Genitourinary: Negative for dysuria.  Musculoskeletal: Positive for back pain.  Skin: Negative for rash.  Neurological: Positive for headaches.  All other systems reviewed and are negative.    Allergies  Review of patient's allergies indicates no known allergies.  Home Medications   Current Outpatient Rx  Name Route Sig Dispense Refill  . MULTIVITAMIN PO Oral Take by mouth. Take 1 tablet daily with a meal.    . ORLISTAT 60 MG PO CAPS Oral Take 60 mg by mouth 3 (three) times daily with meals.      BP 100/67  Pulse 100  Temp 98.4 F (36.9 C) (Oral)  Resp 18  SpO2 97%  Physical Exam  Constitutional: She is oriented to person, place, and time. She appears well-developed and well-nourished.  HENT:  Head: Normocephalic and atraumatic.  Eyes: Conjunctivae and EOM are normal. Pupils are equal, round, and reactive to light.  Neck: Full passive range of motion without pain. No thyromegaly present.       Moderate nuchal rigidity  Cardiovascular: Normal rate, regular rhythm, S1 normal, S2 normal and intact distal pulses.   Pulmonary/Chest: Effort normal and breath sounds normal.  Abdominal: Soft. Bowel sounds are normal. There is no tenderness. There is no CVA tenderness.  Musculoskeletal: Normal range of motion.  Neurological: She is alert and oriented to person, place, and time. She has normal strength and normal reflexes. No cranial nerve deficit or sensory deficit. She displays a negative Romberg sign. GCS eye subscore is 4. GCS verbal subscore is 5. GCS motor subscore is 6.       Normal Gait  Skin: Skin is warm and dry. No rash noted. No cyanosis. Nails show no clubbing.  Psychiatric: She has a normal mood and affect. Her speech is  normal and behavior is normal.    ED Course  LUMBAR PUNCTURE Date/Time: 09/26/2011 3:06 AM Performed by: Sunnie Nielsen Authorized by: Sunnie Nielsen Consent: Verbal consent obtained. Risks and benefits: risks, benefits and alternatives were discussed Consent given by: patient Patient understanding: patient states understanding of the procedure being performed Patient consent: the patient's understanding of the procedure matches consent given Procedure consent: procedure consent matches procedure scheduled Required items: required blood products, implants, devices, and special equipment available Time out: Immediately prior to procedure a "time out" was called to verify the correct patient, procedure, equipment, support staff and site/side marked as required. Indications: evaluation for infection Anesthesia: local infiltration Local anesthetic: lidocaine 1% without epinephrine Anesthetic total: 2 ml Preparation: Patient was prepped and draped in the usual sterile fashion. Lumbar space: L4-L5 interspace Patient's position: sitting Needle gauge: 20 Needle type: spinal needle - Quincke tip Needle length: 1.5 in Number of attempts: 2 Fluid appearance: clear Tubes of fluid: 4 Total volume: 4 ml Post-procedure: site cleaned and adhesive bandage applied Patient tolerance: Patient tolerated the procedure well with no immediate complications.   (including critical care time)  Results for orders placed during the hospital encounter of 09/26/11  CBC      Component Value Range   WBC 10.1  4.0 - 10.5 K/uL   RBC 4.21  3.87 - 5.11 MIL/uL   Hemoglobin 11.7 (*) 12.0 - 15.0 g/dL   HCT 95.6 (*) 21.3 - 08.6 %   MCV 84.8  78.0 - 100.0 fL   MCH 27.8  26.0 - 34.0 pg   MCHC 32.8  30.0 - 36.0 g/dL   RDW 57.8  46.9 - 62.9 %   Platelets 318  150 - 400 K/uL  BASIC METABOLIC PANEL      Component Value Range   Sodium 134 (*) 135 - 145 mEq/L   Potassium 3.8  3.5 - 5.1 mEq/L   Chloride 100  96 - 112  mEq/L   CO2 23  19 - 32 mEq/L   Glucose, Bld 110 (*) 70 - 99 mg/dL   BUN 8  6 - 23 mg/dL   Creatinine, Ser 5.28  0.50 - 1.10 mg/dL   Calcium 9.0  8.4 - 41.3 mg/dL   GFR calc non Af Amer >90  >90 mL/min   GFR calc Af Amer >90  >90 mL/min  CSF CELL COUNT WITH DIFFERENTIAL      Component Value Range   Tube # 3     Color, CSF COLORLESS  COLORLESS   Appearance, CSF CLEAR  CLEAR   Supernatant NOT INDICATED     RBC Count, CSF 11 (*) 0 /cu mm   WBC, CSF 590 (*) 0 - 5 /cu mm   Segmented Neutrophils-CSF 2  0 - 6 %   Lymphs, CSF 76  40 - 80 %   Monocyte-Macrophage-Spinal Fluid 22  15 - 45 %  PROTEIN AND GLUCOSE, CSF      Component Value Range   Glucose, CSF 56  43 - 76 mg/dL   Total  Protein, CSF 161 (*) 15 - 45 mg/dL  GRAM STAIN      Component Value Range   Specimen Description CSF     Special Requests NONE     Gram Stain       Value: NO ORGANISMS SEEN     WBC PRESENT,BOTH PMN AND MONONUCLEAR     CYTOSPUN   Report Status 09/26/2011 FINAL     Ct Head Wo Contrast  09/26/2011  *RADIOLOGY REPORT*  Clinical Data: 36 year old female with headache pain body aches fever.  History of meningitis several years ago.  CT HEAD WITHOUT CONTRAST  Technique:  Contiguous axial images were obtained from the base of the skull through the vertex without contrast.  Comparison: Head CT 09/28/2008.  Findings: Visualized paranasal sinuses and mastoids are clear. Visualized orbits and scalp soft tissues are within normal limits. No acute osseous abnormality identified.  The calf Cerebral volume is within normal limits for age.  No midline shift, ventriculomegaly, mass effect, evidence of mass lesion, intracranial hemorrhage or evidence of cortically based acute infarction.  Gray-white matter differentiation is within normal limits throughout the brain.  No suspicious intracranial vascular hyperdensity.  IMPRESSION: Stable and normal noncontrast CT appearance of the brain.  Original Report Authenticated By: Ulla Potash  III, M.D.    IV fluids and IV headache cocktail. After LP, IV ABx initiated.  Medicine consult obtained. Case discussed with [facility] on-call who agrees to admission.  On recheck, headache improving, still has photophobia.    MDM   Fever headache and nuchal rigidity concern for meningitis. Plan medical admission. Labs reviewed as above. CSF culture pending.        Sunnie Nielsen, MD 09/26/11 [phone]

## 2011-09-26 NOTE — ED Notes (Signed)
Pt and friend reports she is complaining of generalized body aches and pain with headache and neck pain and fever 100.7 this PM

## 2011-09-26 NOTE — Progress Notes (Signed)
Initial review for inpatient status completed. 

## 2011-09-26 NOTE — ED Notes (Signed)
Dr Dierdre Highman aware of csf wbc count

## 2011-09-26 NOTE — ED Notes (Signed)
Patient transported to X-ray 

## 2011-09-26 NOTE — H&P (Signed)
Family Medicine Teaching [facility] Admission History and Physical  Patient name: Angela Farrell Medical record number: [id number] Date of birth: 1975/04/29 Age: 36 y.o. Gender: female  Primary Care Provider: No primary provider on file.  Chief Complaint: fever and headache History of Present Illness: Angela Farrell is a 37 y.o. year old female with history of viral meningitis x2 presenting with 1 day history of fever, headache, and neck pain.  Patient was in her usual state of good health until yesterday morning.  When she woke up she started experiencing diffuse body pain, worse in her hips and thighs.  She also developed a prominent headache located diffusely, worse with bending over.  Associated with photophobia. Reports neck pain, particularly with flexion.  Reports fever at home to 100.7.  Mild nausea and abdominal pain.  Some subjective weakness.  No rashes, shortness of breath, chest pain, vomiting, diarrhea, numbness/tingling.  Friend in the ED denies any confusion or disorientation.  Reports that these symptoms are the same as when she had viral meningitis in the past.  Did go camping recently.  No exposure to contaminated water, no reported tick bites.  ED Course: LP done.  Received ceftriaxone 1g x1, Vanc 1g x1, acyclovir 600mg  x1.  Also received headache cocktail.  Patient Active Problem List  Diagnosis  . Chronic headache disorder  . Obesity  . Allergic rhinitis  . Viral meningitis   Past Medical History: Past Medical History  Diagnosis Date  . Anxiety   . Meningitis, viral At age 60 and at age 56  . Other and unspecified ovarian cysts 2011- pelvic CT    GYN- Dr. Chevis Pretty  . Anemia   . Depression   . Chronic headache disorder     s/p Meningitis x 2, tension- type plus allergic/sinus problems  . History of abnormal Pap smear   . Nasal fracture 2011    Past Surgical History: Past Surgical History  Procedure Date  . Foot surgery s/p 2 procedures  . Laparoscopy for  ectopic pregnancy 1998    Fallopian tube removed  . Uterine artery embolization     Social History: History   Social History  . Marital Status: Single    Spouse Name: N/A    Number of Children: N/A  . Years of Education: N/A   Social History Main Topics  . Smoking status: Former Smoker; quit 1 year ago  . Smokeless tobacco: None  . Alcohol Use: Yes; occasional  . Drug Use: No  . Sexually Active: None   Other Topics Concern  . None   Social History Narrative  . None  Lives at home with 13 year old daughter who is currently at a summer camp.  Family History: Family History  Problem Relation Age of Onset  . Migraines Father     cluster headache  . Cancer Mother   . Diabetes      paternal relaitve  . Seizures      maternal relative  . Rheum arthritis      maternal relatives    Allergies: No Known Allergies  Current Facility-Administered Medications  Medication Dose Route Frequency Provider Last Rate Last Dose  . acyclovir (ZOVIRAX) 600 mg in dextrose 5 % 100 mL IVPB  600 mg Intravenous Once Sunnie Nielsen, MD      . cefTRIAXone (ROCEPHIN) 1 g in dextrose 5 % 50 mL IVPB  1 g Intravenous Once Sunnie Nielsen, MD   1 g at 09/26/11 0326  . dexamethasone (DECADRON) injection  10 mg  10 mg Intravenous Once Sunnie Nielsen, MD   10 mg at 09/26/11 0129  . diphenhydrAMINE (BENADRYL) injection 25 mg  25 mg Intravenous Once Sunnie Nielsen, MD   25 mg at 09/26/11 0129  . metoCLOPramide (REGLAN) injection 10 mg  10 mg Intravenous Once Sunnie Nielsen, MD   10 mg at 09/26/11 0128  . sodium chloride 0.9 % bolus 1,000 mL  1,000 mL Intravenous Once Sunnie Nielsen, MD   1,000 mL at 09/26/11 0129  . vancomycin (VANCOCIN) IVPB 1000 mg/200 mL premix  1,000 mg Intravenous Once Sunnie Nielsen, MD   1,000 mg at 09/26/11 1610   Current Outpatient Prescriptions  Medication Sig Dispense Refill  . Multiple Vitamins-Minerals (MULTIVITAMIN PO) Take by mouth. Take 1 tablet daily with a meal.      . orlistat (ALLI) 60  MG capsule Take 60 mg by mouth 3 (three) times daily with meals.       Review Of Systems: Per HPI with the following additions: none Otherwise 12 point review of systems was performed and was unremarkable.  Physical Exam: Pulse: 102  Blood Pressure: 121/69 RR: 18   O2: 97 on RA Temp: 98.4  General: alert, cooperative, appears stated age, mild distress and moderately obese HEENT: PERRLA, extra ocular movement intact, sclera clear, anicteric, oropharynx clear, no lesions and nuchal rigidity, particularly with flexion Heart: S1, S2 normal, no murmur, rub or gallop, regular rate and rhythm Lungs: clear to auscultation, no wheezes or rales and unlabored breathing Abdomen: +BS, mild diffuse tenderness., no rebound tenderness, no guarding or rigidity Extremities: extremities normal, atraumatic, no cyanosis or edema Skin:no rashes, no ecchymoses, no petechiae Neurology: normal without focal findings, mental status, speech normal, alert and oriented x3, PERLA and cranial nerves 2-12 intact  Labs and Imaging: Lab Results  Component Value Date/Time   NA 134* 09/26/2011  1:15 AM   K 3.8 09/26/2011  1:15 AM   CL 100 09/26/2011  1:15 AM   CO2 23 09/26/2011  1:15 AM   BUN 8 09/26/2011  1:15 AM   CREATININE 0.69 09/26/2011  1:15 AM   GLUCOSE 110* 09/26/2011  1:15 AM   Lab Results  Component Value Date   WBC 10.1 09/26/2011   HGB 11.7* 09/26/2011   HCT 35.7* 09/26/2011   MCV 84.8 09/26/2011   PLT 318 09/26/2011   CSF studies Glucose: 56 Protein: 178 RBC: 11 WBC: 590 Lymphs: 76 Neutrophils: 2 Mono: 22 Gram stain: no organisms Culture: pending  CT Head Stable and normal noncontrast CT appearance of the brain.  Assessment and Plan: Angela Farrell is a 36 y.o. year old female with history of viral meningitis x2 presenting with fever, headache, neck pain and CSF consistent with viral meningitis.  # Meningitis: History and exam consistent with meningitis.  Likely viral given CSF studies.  With  recent camping trip do need to be concerned about tick-bourne disease; however this is unlikely given no report of tick bite.  - f/u CSF culture and HSV pcr - continue meningitic doses of vanc and ceftriaxone until CSF cultures negative x48 hours - continue acyclovir to cover HSV - zofran prn for nausea - dilaudid 1mg  q3hr prn for pain    FEN/GI: regular diet; NS at 125cc/hr Prophylaxis: SQ lovenox Disposition: admit to inpatient  BOOTH, ERIN 09/26/2011, 5:43 AM   UPPER LEVEL ADDENDUM  I have seen and examined Basilio Cairo with Dr. Elwyn Reach and I agree with the above assessment/plan. I have reviewed all  available data and have made any necessary changes to the above H&P.  Jayant Kriz 09/26/2011, 7:11 AM

## 2011-09-26 NOTE — ED Notes (Signed)
Report from Kristi, RN

## 2011-09-26 NOTE — H&P (Signed)
I interviewed and examined this patient and discussed the care plan with Dr. Fara Boros and the [facility] team and agree with assessment and plan as documented in the admission note for today. She denies signs of CSF leak.  I believe we can stop the Vancomycin, lacking indicators of bacterial meningitis. The Ceftriaxone can be continued for the possibility of Lyme Disease. Consider an ID consult due to her recurrences of meningeal symptoms.     Shyrl Obi A. Sheffield Slider, MD Family Medicine Teaching Service Attending  09/26/2011 5:33 PM

## 2011-09-26 NOTE — ED Notes (Signed)
Antibiotics started, nad noted, abc intact.

## 2011-09-27 ENCOUNTER — Encounter (HOSPITAL_COMMUNITY): Payer: Self-pay | Admitting: *Deleted

## 2011-09-27 LAB — BASIC METABOLIC PANEL
BUN: 7 mg/dL (ref 6–23)
CO2: 20 mEq/L (ref 19–32)
Chloride: 104 mEq/L (ref 96–112)
GFR calc non Af Amer: >90 mL/min (ref >90)
Glucose, Bld: 115 mg/dL — ABNORMAL HIGH (ref 70–99)
Potassium: 4 mEq/L (ref 3.5–5.1)
Sodium: 135 mEq/L (ref 135–145)

## 2011-09-27 LAB — CBC
HCT: 33.1 % — ABNORMAL LOW (ref 36.0–46.0)
Hemoglobin: 10.8 g/dL — ABNORMAL LOW (ref 12.0–15.0)
MCH: 27.6 pg (ref 26.0–34.0)
MCV: 84.4 fL (ref 78.0–100.0)
RBC: 3.92 MIL/uL (ref 3.87–5.11)

## 2011-09-27 LAB — HERPES SIMPLEX VIRUS(HSV) DNA BY PCR
HSV 1 DNA: NOT DETECTED
HSV 2 DNA: DETECTED

## 2011-09-27 MED ORDER — KETOROLAC TROMETHAMINE 30 MG/ML IJ SOLN
30.0000 mg | Freq: Three times a day (TID) | INTRAMUSCULAR | Status: DC
  Administered 2011-09-27 – 2011-09-28 (×4): 30 mg via INTRAVENOUS
  Filled 2011-09-27 (×8): qty 1

## 2011-09-27 MED ORDER — CYCLOBENZAPRINE HCL 10 MG PO TABS
5.0000 mg | ORAL_TABLET | Freq: Three times a day (TID) | ORAL | Status: DC | PRN
  Administered 2011-09-27 – 2011-09-29 (×3): 5 mg via ORAL
  Filled 2011-09-27: qty 1
  Filled 2011-09-27 (×2): qty 2

## 2011-09-27 MED ORDER — VALACYCLOVIR HCL 500 MG PO TABS
1000.0000 mg | ORAL_TABLET | Freq: Three times a day (TID) | ORAL | Status: DC
  Administered 2011-09-27 – 2011-09-29 (×7): 1000 mg via ORAL
  Filled 2011-09-27 (×9): qty 2

## 2011-09-27 MED ORDER — HYDROMORPHONE HCL PF 1 MG/ML IJ SOLN
1.0000 mg | INTRAMUSCULAR | Status: DC | PRN
  Administered 2011-09-27 – 2011-09-29 (×9): 1 mg via INTRAVENOUS
  Filled 2011-09-27 (×10): qty 1

## 2011-09-27 NOTE — Care Management Note (Signed)
    Page 1 of 1   09/27/2011     2:54:38 PM   CARE MANAGEMENT NOTE 09/27/2011  Patient:  Angela Farrell, Angela Farrell   Account Number:  [id number]  Date Initiated:  09/27/2011  Documentation initiated by:  Onnie Boer  Subjective/Objective Assessment:   PT WAS ADMITTED WITH VIRAL MENEGITIS     Action/Plan:   PROGRESSION OF CARE AND DISCHARGE PLANNING   Anticipated DC Date:  09/29/2011   Anticipated DC Plan:  HOME/SELF CARE      DC Planning Services  CM consult      Choice offered to / List presented to:             Status of service:  In process, will continue to follow Medicare Important Message given?   (If response is "NO", the following Medicare IM given date fields will be blank) Date Medicare IM given:   Date Additional Medicare IM given:    Discharge Disposition:    Per UR Regulation:  Reviewed for med. necessity/level of care/duration of stay  If discussed at Long Length of Stay Meetings, dates discussed:    Comments:

## 2011-09-27 NOTE — Progress Notes (Signed)
I discussed the care plan with Dr. Konrad Dolores and the [facility] team and agree with assessment and plan as documented in the progress note for today.    Jeffrey Graefe A. Sheffield Slider, MD Family Medicine Teaching Service Attending  09/27/2011 5:25 PM

## 2011-09-27 NOTE — Progress Notes (Signed)
Family Medicine Teaching Service [facility] Progress Note  Patient name: Angela Farrell Medical record number: [id number] Date of birth: Apr 25, 1975 Age: 36 y.o. Gender: female    LOS: 1 day   Primary Care Provider: No primary provider on file.  Overnight Events:  Still w/ HA today but improving overall. Tolerating PO w/o N/V. Ambulating w/o difficulty  Objective: Vital signs in last 24 hours: Temp:  [97.8 F (36.6 C)-98.5 F (36.9 C)] 97.8 F (36.6 C) (06/26 0600) Pulse Rate:  [86-99] 86  (06/26 0600) Resp:  [18] 18  (06/26 0600) BP: (108-118)/(63-71) 118/67 mmHg (06/26 0600) SpO2:  [97 %-98 %] 98 % (06/26 0600) Weight:  [213 lb 8 oz (96.843 kg)-215 lb 9.8 oz (97.8 kg)] 215 lb 9.8 oz (97.8 kg) (06/26 0600)  Wt Readings from Last 3 Encounters:  09/27/11 215 lb 9.8 oz (97.8 kg)  06/27/11 213 lb 3.2 oz (96.707 kg)  06/21/11 214 lb (97.07 kg)     Current Facility-Administered Medications  Medication Dose Route Frequency Provider Last Rate Last Dose  . 0.9 %  sodium chloride infusion   Intravenous Continuous Phebe Colla, MD 125 mL/hr at 09/26/11 1344    . acetaminophen (TYLENOL) tablet 650 mg  650 mg Oral Q6H PRN Phebe Colla, MD   650 mg at 09/27/11 0053   Or  . acetaminophen (TYLENOL) suppository 650 mg  650 mg Rectal Q6H PRN Phebe Colla, MD      . enoxaparin (LOVENOX) injection 40 mg  40 mg Subcutaneous Q24H Phebe Colla, MD   40 mg at 09/26/11 1200  . HYDROmorphone (DILAUDID) injection 1 mg  1 mg Intravenous Q3H PRN Everrett Coombe, DO   1 mg at 09/27/11 4098  . ondansetron (ZOFRAN) tablet 4 mg  4 mg Oral Q6H PRN Phebe Colla, MD       Or  . ondansetron [facility]) injection 4 mg  4 mg Intravenous Q6H PRN Phebe Colla, MD      . oxyCODONE-acetaminophen (PERCOCET) 5-325 MG per tablet 1 tablet  1 tablet Oral Q6H PRN Everrett Coombe, DO   1 tablet at 09/27/11 [phone]  . valACYclovir (VALTREX) tablet 1,000 mg  1,000 mg Oral TID Judyann Munson, MD      . DISCONTD: acyclovir (ZOVIRAX) 600  mg in dextrose 5 % 100 mL IVPB  600 mg Intravenous Once Sunnie Nielsen, MD   600 mg at 09/26/11 1118  . DISCONTD: acyclovir (ZOVIRAX) 600 mg in dextrose 5 % 100 mL IVPB  600 mg Intravenous Q8H Phebe Colla, MD      . DISCONTD: acyclovir (ZOVIRAX) 600 mg in dextrose 5 % 100 mL IVPB  600 mg Intravenous Q8H Tobin Chad, MD   600 mg at 09/27/11 4782  . DISCONTD: cefTRIAXone (ROCEPHIN) 2 g in dextrose 5 % 50 mL IVPB  2 g Intravenous Q12H Phebe Colla, MD   2 g at 09/26/11 2245     PE: Gen: Mild Distress CV: RRR REs: normal effort Musc: nuchal rigidity w/ flexion of neck, side to side movement w/o pain Ext: 2+ pulses throughout, no edema Neuro: CN grossly intact, mentation clear, AAO3    Labs/Studies:   Lab 09/27/11 0655 09/26/11 1335 09/26/11 0115  NA 135 -- 134*  K 4.0 -- 3.8  CL 104 -- 100  CO2 20 -- 23  BUN 7 -- 8  CREATININE 0.61 0.58 0.69  LABGLOM -- -- --  GLUCOSE 115* -- --  CALCIUM 8.2* --  9.0     Lab 09/27/11 0655 09/26/11 1335 09/26/11 0115  WBC 12.0* 9.9 10.1  HGB 10.8* 11.9* 11.7*  HCT 33.1* 36.3 35.7*  PLT 226 201 318   RPR - neg HIV - neg    Assessment/Plan: SENG FOUTS is a 36 y.o. year old female with history of viral meningitis x2 presenting with fever, headache, neck pain and CSF consistent with viral meningitis.   Meningitis: Neck pain/rigidity/HA improving today. Some of HA symptoms possibly from LP. ID following, and appreciate recommendations. Likely viral given previous CSF studies and previous HSV-2 infection.  On acyclovir day 3/7 today - f/u CSF culture and HSV pcr  - Will add enterovirus panel to CSF - Will switch IV acyclovir to valtrex 1gm PO TID.  - DC CTX - zofran prn for nausea  - continue dilaudid 1mg  q3hr prn and percocet for pain. Will wean as pain improves.   FEN/GI: denies n/v. Tolerating PO - Continue NS at 125cc/hr   Prophylaxis: SQ lovenox   Disposition: Pending improvement. Potentially tomorrow  LOS  1  Signed: Shelly Flatten, MD Family Medicine Resident PGY-1 [phone] 09/27/2011 9:29 AM

## 2011-09-28 LAB — CBC WITH DIFFERENTIAL/PLATELET
Basophils Absolute: 0 10*3/uL (ref 0.0–0.1)
HCT: 31.8 % — ABNORMAL LOW (ref 36.0–46.0)
Lymphocytes Relative: 40 % (ref 12–46)
Lymphs Abs: 3.1 10*3/uL (ref 0.7–4.0)
Monocytes Absolute: 0.5 10*3/uL (ref 0.1–1.0)
Neutro Abs: 4 10*3/uL (ref 1.7–7.7)
RBC: 3.74 MIL/uL — ABNORMAL LOW (ref 3.87–5.11)
RDW: 13 % (ref 11.5–15.5)
WBC: 7.7 10*3/uL (ref 4.0–10.5)

## 2011-09-28 LAB — EHRLICHIA ANTIBODY PANEL: E chaffeensis (HGE) Ab, IgM: NEGATIVE

## 2011-09-28 LAB — ROCKY MTN SPOTTED FVR AB, IGG-BLOOD: RMSF IgG: 0.88 IV — ABNORMAL HIGH

## 2011-09-28 MED ORDER — CAFFEINE-SODIUM BENZOATE 125-125 MG/ML IJ SOLN: 500.0000 mg | Freq: Once | INTRAMUSCULAR | Status: DC

## 2011-09-28 NOTE — Progress Notes (Signed)
I interviewed and examined this patient and discussed the care plan with Dr. Konrad Dolores and the [facility] team and agree with assessment and plan as documented in the progress note for today. She is convinced that the headache is the same as she's had with the meningitis, and worsens with neck flexion rather than sitting up. We will try medication, and defer the blood patch for CSF leak.     Majour Frei A. Sheffield Slider, MD Family Medicine Teaching Service Attending  09/28/2011 4:05 PM

## 2011-09-28 NOTE — Progress Notes (Signed)
Family Medicine Teaching Service [facility] Progress Note  Patient name: Angela Farrell Medical record number: [id number] Date of birth: November 07, 1975 Age: 36 y.o. Gender: female    LOS: 2 days   Primary Care Provider: No primary provider on file.  Overnight Events:  NAEO. No change in HA today. Some improvement in neck mobility. Some nausea w/ HA  Objective: Vital signs in last 24 hours: Temp:  [97.8 F (36.6 C)-98.3 F (36.8 C)] 98.3 F (36.8 C) (06/27 0600) Pulse Rate:  [74-89] 74  (06/27 0600) Resp:  [18] 18  (06/27 0600) BP: (119-137)/(68-91) 124/79 mmHg (06/27 0600) SpO2:  [97 %-100 %] 97 % (06/27 0600) Weight:  [217 lb 6 oz (98.6 kg)] 217 lb 6 oz (98.6 kg) (06/27 0600)  Wt Readings from Last 3 Encounters:  09/28/11 217 lb 6 oz (98.6 kg)  06/27/11 213 lb 3.2 oz (96.707 kg)  06/21/11 214 lb (97.07 kg)     Current Facility-Administered Medications  Medication Dose Route Frequency Provider Last Rate Last Dose  . 0.9 %  sodium chloride infusion   Intravenous Continuous Phebe Colla, MD 125 mL/hr at 09/26/11 1344    . acetaminophen (TYLENOL) tablet 650 mg  650 mg Oral Q6H PRN Phebe Colla, MD   325 mg at 09/27/11 1305   Or  . acetaminophen (TYLENOL) suppository 650 mg  650 mg Rectal Q6H PRN Phebe Colla, MD      . cyclobenzaprine (FLEXERIL) tablet 5 mg  5 mg Oral TID PRN Ozella Rocks, MD   5 mg at 09/27/11 1500  . enoxaparin (LOVENOX) injection 40 mg  40 mg Subcutaneous Q24H Phebe Colla, MD   40 mg at 09/27/11 0930  . HYDROmorphone (DILAUDID) injection 1 mg  1 mg Intravenous Q2H PRN Ozella Rocks, MD   1 mg at 09/28/11 0443  . ketorolac (TORADOL) 30 MG/ML injection 30 mg  30 mg Intravenous Q8H Ozella Rocks, MD   30 mg at 09/27/11 2231  . ondansetron (ZOFRAN) tablet 4 mg  4 mg Oral Q6H PRN Phebe Colla, MD       Or  . ondansetron [facility]) injection 4 mg  4 mg Intravenous Q6H PRN Phebe Colla, MD      . oxyCODONE-acetaminophen (PERCOCET) 5-325 MG per tablet 1 tablet  1  tablet Oral Q6H PRN Everrett Coombe, DO   1 tablet at 09/27/11 2034  . valACYclovir (VALTREX) tablet 1,000 mg  1,000 mg Oral TID Judyann Munson, MD   1,000 mg at 09/27/11 2231  . DISCONTD: acyclovir (ZOVIRAX) 600 mg in dextrose 5 % 100 mL IVPB  600 mg Intravenous Q8H Tobin Chad, MD   600 mg at 09/27/11 4098  . DISCONTD: cefTRIAXone (ROCEPHIN) 2 g in dextrose 5 % 50 mL IVPB  2 g Intravenous Q12H Phebe Colla, MD   2 g at 09/26/11 2245  . DISCONTD: HYDROmorphone (DILAUDID) injection 1 mg  1 mg Intravenous Q3H PRN Everrett Coombe, DO   1 mg at 09/27/11 1305     PE: Gen: Mild Distress  CV: RRR, no mr/g/ REs: normal effort  CTAB Musc: nuchal rigidity w/ flexion of neck, side to side movement w/o pain  Ext: 2+ pulses throughout, no edema  Neuro: CN grossly intact, mentation clear, AAO3    Labs/Studies:   Lab 09/27/11 0655 09/26/11 1335 09/26/11 0115  NA 135 -- 134*  K 4.0 -- 3.8  CL 104 -- 100  CO2 20 -- 23  BUN 7 -- 8  CREATININE 0.61 0.58 0.69  LABGLOM -- -- --  GLUCOSE 115* -- --  CALCIUM 8.2* -- 9.0     Lab 09/27/11 0655 09/26/11 1335 09/26/11 0115  WBC 12.0* 9.9 10.1  HGB 10.8* 11.9* 11.7*  HCT 33.1* 36.3 35.7*  PLT 226 201 318    CSF CX: HSV2  RPR, HIV: NR   Assessment/Plan:  TRINITY HAUN is a 36 y.o. year old female with history of viral meningitis x2 presenting with fever, headache, neck pain and CSF consistent with viral meningitis, now confirmed as her 3rd bout of meningitis and confirmed second w/ HSV2.    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 Neck pain/rigidity/ improving today, but w/ persistent HA. Persistent HA likely from meningitis complicated w/ possible post LP HA. HA improves when prone.  ID following, and appreciate recommendations. On acyclovir day 4/7 today  - F./u enterovirus panel to CSF  - Continue Valtrex 1gm PO TID.  - zofran prn for nausea  - continue dilaudid 1mg  q2hr prn, toradol, and percocet for pain. Will  wean as pain improves.  - Continue Flexeril TID - Pt needs to rest in prone position as much as possible.  - Pt to stay prone for 24 hrs  - Potential blood patch if HA does not improve by tomorrow - Benzoate Caffeine infusion today for HA  FEN/GI: denies v. Tolerating PO, IV infiltrated. - Rinsert and SLIV   Prophylaxis: SQ lovenox   Disposition: Pending improvement. Potentially tomorrow     LOS 2  Signed: Shelly Flatten, MD Family Medicine Resident PGY-1 [phone] 09/28/2011 7:31 AM

## 2011-09-29 LAB — CSF CULTURE W GRAM STAIN: Culture: NO GROWTH

## 2011-09-29 MED ORDER — OXYCODONE-ACETAMINOPHEN 5-325 MG PO TABS: 1.0000 | ORAL_TABLET | Freq: Four times a day (QID) | ORAL | Status: AC | PRN

## 2011-09-29 MED ORDER — VALACYCLOVIR HCL 1 G PO TABS: 1000.0000 mg | ORAL_TABLET | Freq: Three times a day (TID) | ORAL | Status: DC

## 2011-09-29 MED ORDER — CYCLOBENZAPRINE HCL 5 MG PO TABS: 5.0000 mg | ORAL_TABLET | Freq: Three times a day (TID) | ORAL | Status: AC | PRN

## 2011-09-29 NOTE — Progress Notes (Signed)
Pt d/c instructions provided. Pt iv dc intact. Pt prescription provided. Pt under no s/s distress.

## 2011-09-29 NOTE — Discharge Summary (Signed)
Family Medicine Resident Discharge Summary  [name] [id number] 36 y.o. 1975-08-22  Admit date: 09/26/2011  Discharge date and time: 09/29/2011 12:41 PM  Admitting Physician: Tobin Chad, MD   Discharge Physician: Zachery Dauer MD  Admission Diagnoses: Meningitis [322.9] pain  Discharge Diagnoses: Mollaret's Meningitis  Admission Condition: poor  Discharged Condition: good  Indication for Admission: Meningitis  Hospital Course:  Angela Farrell is a 36 y.o. year old female with history of viral meningitis x2 presenting with fever, headache, neck pain and CSF consistent with viral meningitis  Meningitis: Pt w/ significant nuchal rigidity and HA on admission. Started on vancomycin, CTX, acyclovir. Further investigation and LP results led team to pursue a viral meningitis workup. ID consulted as this was pts third viral meningitis. Pt vanc and CTX was stopped and acyclovir was changed to PO Valtrex TID. Pt placed on IVF and given percocet and dilaudid for pain. Initially improved, however pt developed worsening HA approximately 18hrs after LP that was improved w/ being prone. Concern for LP HA and possible CSF leak. Pt given caffeine and ordered to rest prone. HA improved on 09/28/11 and pt changed to PO pain meds on 6/28 w/ good control of HA. Pt w/o any neurological deficits or AMS during admission or on DC. Pt to follow up with ID as outpt. Pt DC on oral valtrex for a total 7 day course.   Consults: ID  Significant Diagnostic Studies:   Lab 09/27/11 0655 09/26/11 1335 09/26/11 0115  NA 135 -- 134*  K 4.0 -- 3.8  CL 104 -- 100  CO2 20 -- 23  BUN 7 -- 8  CREATININE 0.61 0.58 0.69  LABGLOM -- -- --  GLUCOSE 115* -- --  CALCIUM 8.2* -- 9.0    Lab Results   Component  Value  Date    WBC  10.1  09/26/2011    HGB  11.7*  09/26/2011    HCT  35.7*  09/26/2011    MCV  84.8  09/26/2011    PLT  318  09/26/2011      Lab 09/28/11 1010 09/27/11 0655 09/26/11  1335  WBC 7.7 12.0* 9.9  HGB 10.4* 10.8* 11.9*  HCT 31.8* 33.1* 36.3  PLT 287 226 201  NEUTOPHILPCT 52 -- --  MONOPCT 6 -- --    CSF studies  Glucose: 56  Protein: 178  RBC: 11  WBC: 590  Lymphs: 76  Neutrophils: 2  Mono: 22  Gram stain: no organisms  Culture: HSV2  Ct Head Wo Contrast 09/26/2011  *RADIOLOGY REPORT*  Clinical Data: 36 year old female with headache pain body aches fever.  History of meningitis several years ago.  CT HEAD WITHOUT CONTRAST  Technique:  Contiguous axial images were obtained from the base of the skull through the vertex without contrast.  Comparison: Head CT 09/28/2008.  Findings: Visualized paranasal sinuses and mastoids are clear. Visualized orbits and scalp soft tissues are within normal limits. No acute osseous abnormality identified.  The calf Cerebral volume is within normal limits for age.  No midline shift, ventriculomegaly, mass effect, evidence of mass lesion, intracranial hemorrhage or evidence of cortically based acute infarction.  Gray-white matter differentiation is within normal limits throughout the brain.  No suspicious intracranial vascular hyperdensity.  IMPRESSION: Stable and normal noncontrast CT appearance of the brain.  Original Report Authenticated By: Harley Hallmark, M.D.   Discharge Exam: Gen: Mild Distress  CV: RRR, no mr/g/  REs: normal effort CTAB  Musc:  minimal nuchal rigidity w/ flexion of neck, side to side movement w/o pain  Ext: 2+ pulses throughout, no edema  Neuro: CN grossly intact, mentation clear, AAO3   Disposition: home  Patient Instructions: Medication List  As of 09/29/2011  1:09 PM   TAKE these medications         cyclobenzaprine 5 MG tablet   Commonly known as: FLEXERIL   Take 1 tablet (5 mg total) by mouth 3 (three) times daily as needed for muscle spasms.      MULTIVITAMIN PO   Take by mouth. Take 1 tablet daily with a meal.      orlistat 60 MG capsule   Commonly known as: ALLI   Take 60 mg by  mouth 3 (three) times daily with meals.      oxyCODONE-acetaminophen 5-325 MG per tablet   Commonly known as: PERCOCET   Take 1 tablet by mouth every 6 (six) hours as needed.      valACYclovir 1000 MG tablet   Commonly known as: VALTREX   Take 1 tablet (1,000 mg total) by mouth 3 (three) times daily.            Activity: activity as tolerated Diet: regular diet Wound Care: none needed  Follow-up with [facility] Follow-up with Dr. Drue Second of ID on 10/25/11 at 11:15  Follow-up Items: 1. Mollaret's meningitis 2. +/- antiviral suppressive therapy  Signed: Shelly Flatten, MD Family Medicine Resident PGY-1 [phone] 09/29/2011 1:09 PM

## 2011-09-29 NOTE — Discharge Instructions (Signed)
You were admitted due to a viral meningitis. 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 Please take all medications as prescribed. Please follow up with Dr. Drue Second on July 24th at 11:15. Please follow up with your doctor at [facility] sometime in the next week.      Viral Meningitis Meningitis is an inflammation of the fluid and membranes surrounding the spinal cord and brain. Viral meningitis is caused by a viral infection. It is important to know whether a virus or bacterium is causing your meningitis. Viral meningitis is generally less severe than bacterial meningitis. Aseptic meningitis is any meningitis not caused by a bacterial infection, including viral meningitis. Meningitis can also be caused by fungi, parasites, certain medicines, cancer, and autoimmune disorders. Uncomplicated meningitis usually resolves on its own in 7 to 10 days.However, there can be complicated cases that last much longer. People with lowered immune systems are at greater risk for poor outcomes from meningitis.It is important to get treatment as soon as possible to minimize the impact of a meningitis infection. Long-term complications could include seizures, hearing loss, weakness, paralysis, blindness, or cognitive impairment. CAUSES Most cases of meningitis are due to viruses. Viruses that cause meningitis include enteroviruses (such as polio and coxsackie viruses), herpes simplex virus, varicella zoster virus, mumps, and HIV. SYMPTOMS  Symptoms can develop over many hours. They may even take a few days to develop. Common symptoms of meningitis in people over the age of 2 years include:  High fever.   Headache.   Stiff neck.   Irritability.   Nausea and vomiting.   Fatigue.   Discomfort from exposure to light.   Discomfort from exposure to loud noise.   Trouble walking.   Altered mental status.   Seizures.  DIAGNOSIS  Early diagnosis and treatment are very important.  If you have symptoms, you should see your caregiver right away. Your evaluation may include lab tests of your blood and spinal fluid. A spinal fluid sample is taken through a procedure called a lumbar puncture or spinal tap. During the procedure, a needle is inserted into an area in the lower back. You may also have a CT scan of your brain as part of your evaluation.If there is suspicion of an infection or inflammation of the brain (encephalitis), an MRI scan may be done. TREATMENT   Antibiotics are not effective against a virus. Antiviral medicines may be used depending on the cause of your meningitis.   Treatment for viral meningitis focuses on reducing your symptoms. This may include rest, hydration, and medicines to reduce fever and pain.   Steroids may also be used if there is significant swelling of the brain.  PREVENTION  Vaccines can help prevent meningitis caused by polio, measles, and mumps.   Strict hand washing is effective in controlling the spread of many causes of meningitis.   Using barrier protection during sexual intercourse can prevent the spread of meningitis caused by viruses such as the herpes virus or HIV.   Protection from mosquitoes, especially for the very young and very old, can prevent some causes of meningitis.  HOME CARE INSTRUCTIONS  Rest.   Drink enough water and fluids to keep your urine clear or pale yellow.   Take all medicine as prescribed.   Practice good hygiene to prevent others from getting sick.   Follow up with your caregiver as directed.  SEEK IMMEDIATE MEDICAL CARE IF:  You develop dizziness.   You have a very fast heartbeat.  You have difficulty breathing.   You develop confusion.   You have seizures.   You have unstoppable nausea and vomiting.   You develop any worsening symptoms.  MAKE SURE YOU:  Understand these instructions.   Will watch your condition.   Will get help right away if you are not doing well or get  worse.  Document Released: 03/10/2002 Document Revised: 03/09/2011 Document Reviewed: 07/05/2010 [facility] Patient Information 2012 [place], [place].

## 2011-09-29 NOTE — Discharge Summary (Signed)
I interviewed and examined this patient and discussed the care plan with Dr. Konrad Dolores and the [facility] team and agree with assessment and plan as documented in the discharge note for today.    Aldora Perman A. Sheffield Slider, MD Family Medicine Teaching Service Attending  09/29/2011 1:50 PM

## 2011-10-12 ENCOUNTER — Ambulatory Visit (INDEPENDENT_AMBULATORY_CARE_PROVIDER_SITE_OTHER): Payer: 59 | Admitting: Family Medicine

## 2011-10-12 VITALS — BP 128/80 | HR 105 | Temp 97.1°F | Resp 18 | Ht 64.0 in | Wt 209.0 lb

## 2011-10-12 DIAGNOSIS — G4700 Insomnia, unspecified: Secondary | ICD-10-CM

## 2011-10-12 DIAGNOSIS — B003 Herpesviral meningitis: Secondary | ICD-10-CM

## 2011-10-12 MED ORDER — VALACYCLOVIR HCL 1 G PO TABS: 1000.0000 mg | ORAL_TABLET | Freq: Three times a day (TID) | ORAL | Status: DC

## 2011-10-12 MED ORDER — ALPRAZOLAM 0.5 MG PO TBDP: ORAL_TABLET | ORAL | Status: DC

## 2011-10-12 NOTE — Patient Instructions (Addendum)
Continue taking Valtrex as prescribed 3 times a day until you see the Infectious Disease Specialist.   Get OTC Vitamin D3  1000 IU and take 1 daily in addition to your multivitamin.   Insomnia Insomnia is frequent trouble falling and/or staying asleep. Insomnia can be a long term problem or a short term problem. Both are common. Insomnia can be a short term problem when the wakefulness is related to a certain stress or worry. Long term insomnia is often related to ongoing stress during waking hours and/or poor sleeping habits. Overtime, sleep deprivation itself can make the problem worse. Every little thing feels more severe because you are overtired and your ability to cope is decreased. CAUSES   Stress, anxiety, and depression.   Poor sleeping habits.   Distractions such as TV in the bedroom.   Naps close to bedtime.   Engaging in emotionally charged conversations before bed.   Technical reading before sleep.   Alcohol and other sedatives. They may make the problem worse. They can hurt normal sleep patterns and normal dream activity.   Stimulants such as caffeine for several hours prior to bedtime.   Pain syndromes and shortness of breath can cause insomnia.   Exercise late at night.   Changing time zones may cause sleeping problems (jet lag).  It is sometimes helpful to have someone observe your sleeping patterns. They should look for periods of not breathing during the night (sleep apnea). They should also look to see how long those periods last. If you live alone or observers are uncertain, you can also be observed at a sleep clinic where your sleep patterns will be professionally monitored. Sleep apnea requires a checkup and treatment. Give your caregivers your medical history. Give your caregivers observations your family has made about your sleep.  SYMPTOMS   Not feeling rested in the morning.   Anxiety and restlessness at bedtime.   Difficulty falling and staying asleep.    TREATMENT   Your caregiver may prescribe treatment for an underlying medical disorders. Your caregiver can give advice or help if you are using alcohol or other drugs for self-medication. Treatment of underlying problems will usually eliminate insomnia problems.   Medications can be prescribed for short time use. They are generally not recommended for lengthy use.   Over-the-counter sleep medicines are not recommended for lengthy use. They can be habit forming.   You can promote easier sleeping by making lifestyle changes such as:   Using relaxation techniques that help with breathing and reduce muscle tension.   Exercising earlier in the day.   Changing your diet and the time of your last meal. No night time snacks.   Establish a regular time to go to bed.   Counseling can help with stressful problems and worry.   Soothing music and white noise may be helpful if there are background noises you cannot remove.   Stop tedious detailed work at least one hour before bedtime.  HOME CARE INSTRUCTIONS   Keep a diary. Inform your caregiver about your progress. This includes any medication side effects. See your caregiver regularly. Take note of:   Times when you are asleep.   Times when you are awake during the night.   The quality of your sleep.   How you feel the next day.  This information will help your caregiver care for you.  Get out of bed if you are still awake after 15 minutes. Read or do some quiet activity. Keep the lights  down. Wait until you feel sleepy and go back to bed.   Keep regular sleeping and waking hours. Avoid naps.   Exercise regularly.   Avoid distractions at bedtime. Distractions include watching television or engaging in any intense or detailed activity like attempting to balance the household checkbook.   Develop a bedtime ritual. Keep a familiar routine of bathing, brushing your teeth, climbing into bed at the same time each night, listening to  soothing music. Routines increase the success of falling to sleep faster.   Use relaxation techniques. This can be using breathing and muscle tension release routines. It can also include visualizing peaceful scenes. You can also help control troubling or intruding thoughts by keeping your mind occupied with boring or repetitive thoughts like the old concept of counting sheep. You can make it more creative like imagining planting one beautiful flower after another in your backyard garden.   During your day, work to eliminate stress. When this is not possible use some of the previous suggestions to help reduce the anxiety that accompanies stressful situations.  MAKE SURE YOU:   Understand these instructions.   Will watch your condition.   Will get help right away if you are not doing well or get worse.  Document Released: 03/17/2000 Document Revised: 03/09/2011 Document Reviewed: 04/17/2007 [facility] Patient Information 2012 [place], [place].

## 2011-10-12 NOTE — Progress Notes (Signed)
  Subjective:    [name], female    DOB: 09/03/1975, 36 y.o.   MRN: [id number]  HPI   This 36 y.o. AA female was hospitalized late in June with HSV meningitis (her 3rd bout with meningitis);  1st time at age 77 and again in June 2010. She was discharged on Valtrex 1000 mg tid and has an  appointment with an Infectious Disease specialist before the end of June. She has finished taking the Valtrex  and is not certain why she was advised to follow-up with Primary Care. Her main concern is that she will  relapse; she has persistent right-sided HA and fatigue. She has chronic anemia ("since childhood") that  does not respond to iron and her daughter has Thalassemia.   Review of Systems  Constitutional: Positive for fatigue. Negative for fever and appetite change.  Eyes: Positive for photophobia. Negative for visual disturbance.  Neurological: Positive for headaches.       Right frontal- mild but constant  Psychiatric/Behavioral: Positive for disturbed wake/sleep cycle.       Objective:   Physical Exam  Nursing note and vitals reviewed. Constitutional: She is oriented to person, place, and time. She appears well-developed and well-nourished. No distress.  HENT:  Head: Normocephalic and atraumatic.  Right Ear: Hearing, tympanic membrane, external ear and ear canal normal. Tympanic membrane is not injected. No middle ear effusion.  Left Ear: Hearing, tympanic membrane, external ear and ear canal normal. Tympanic membrane is not injected.  No middle ear effusion.  Nose: Mucosal edema present. No rhinorrhea, sinus tenderness or nasal deformity. Right sinus exhibits no maxillary sinus tenderness and no frontal sinus tenderness. Left sinus exhibits no maxillary sinus tenderness and no frontal sinus tenderness.  Mouth/Throat: Uvula is midline and mucous membranes are normal. No uvula swelling. Posterior oropharyngeal erythema present. No oropharyngeal exudate.  Eyes: EOM are  normal. Pupils are equal, round, and reactive to light. Right conjunctiva is injected. Left conjunctiva is injected. No scleral icterus.  Neck: Normal range of motion. No thyromegaly present.  Cardiovascular: Normal heart sounds.  Exam reveals no gallop and no friction rub.   No murmur heard.      Sinus tachycardia  Pulmonary/Chest: Effort normal and breath sounds normal. No respiratory distress.  Lymphadenopathy:    She has no cervical adenopathy.  Neurological: She is alert and oriented to person, place, and time. No cranial nerve deficit. Coordination normal.  Skin: Skin is warm and dry. No rash noted.  Psychiatric: She has a normal mood and affect. Her behavior is normal. Thought content normal.          Assessment & Plan:   1. 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 Insomnia - pt is having difficulty with getting to sleep ans well as staying asleep due to anxiety RX: Alprazolam 0.5 mg  1/2 -1 tablet at bedtime for restful sleep.

## 2011-10-14 ENCOUNTER — Encounter: Payer: Self-pay | Admitting: Family Medicine

## 2011-10-14 DIAGNOSIS — D649 Anemia, unspecified: Secondary | ICD-10-CM | POA: Insufficient documentation

## 2011-10-25 ENCOUNTER — Ambulatory Visit (INDEPENDENT_AMBULATORY_CARE_PROVIDER_SITE_OTHER): Payer: 59 | Admitting: Internal Medicine

## 2011-10-25 ENCOUNTER — Encounter: Payer: Self-pay | Admitting: Internal Medicine

## 2011-10-25 VITALS — BP 130/81 | HR 97 | Temp 98.1°F | Wt 209.0 lb

## 2011-10-25 DIAGNOSIS — A779 Spotted fever, unspecified: Secondary | ICD-10-CM

## 2011-10-25 DIAGNOSIS — A770 Spotted fever due to Rickettsia rickettsii: Secondary | ICD-10-CM

## 2011-10-25 NOTE — Progress Notes (Signed)
INFECTIOUS DISEASES CLINIC  RFV: hospital follow up to mollaret's meningitis  Subjective:    [name], female    DOB: 1976/01/14, 36 y.o.   MRN: [id number]  HPI  Angela Farrell is a 36 y.o. female with history of recurrent viral meningits presents to ED with 1 day history of fever, headache, nuchal rigidity on 09/26/2011. Underwent LP which found a lymphocytic pleocytosis with CSF WBC count of 590, 76 % lymphocyte, 2% segs, elevated protein 178 and normal glucose at 56, and 11 RBC. She states that this is not has bad as in the past. She had some photophobia, but mainly pain behind her eyes, some mild nausea, and no vomiting.  Her csf culture was NGTD, but HSV 2 was detected in her CSF.   Her last episode was in 2010, where she had similar CSF profile, CSF + HSV 2 pcr. She was unaware that HSV-2 was identified since she has never had any cold sores, genital sores associated with herpes.    Roughly 17 days ago, they were camping in [place] where there were several bugs, unsure if had a tick bite. No noticeable rash. Her RMSF titers were checked where IgM negative, but IgG slightly elevated/equivocle range.  She states that she is fatigued, poor sleep. Takes small dose of xanax at bedtime, but does not work. Feels wiped out in the morning when she wakes up. She has occ. Headaches where she wonders if she is having meningitis again. Most of these headaches do have migraine component but she denies having nuchal rigidity or fevers.   She states that she does get anxious about a lot of things but does not feel that stressors are worse at this time.  Her pcp continued her on valtrex 1gm TID for suppression.  Current Outpatient Prescriptions on File Prior to Visit  Medication Sig Dispense Refill  . ALPRAZolam (NIRAVAM) 0.5 MG dissolvable tablet Take 1/2 to 1 tablet at bedtime for restful sleep.  20 tablet  0  . cyclobenzaprine (FLEXERIL) 5 MG tablet Take 5 mg by mouth 3 (three) times  daily as needed.      . Multiple Vitamins-Minerals (MULTIVITAMIN PO) Take by mouth. Take 1 tablet daily with a meal.      . orlistat (ALLI) 60 MG capsule Take 60 mg by mouth 3 (three) times daily with meals.      Marland Kitchen oxyCODONE-acetaminophen (PERCOCET) 10-325 MG per tablet Take 1 tablet by mouth every 4 (four) hours as needed.      . valACYclovir (VALTREX) 1000 MG tablet Take 1 tablet (1,000 mg total) by mouth 3 (three) times daily.  60 tablet  0   Active Ambulatory Problems    Diagnosis Date Noted  . Chronic headache disorder 04/18/2011  . Obesity 04/18/2011  . Allergic rhinitis 04/18/2011  . Viral meningitis 09/26/2011  . Chronic anemia 10/14/2011   Resolved Ambulatory Problems    Diagnosis Date Noted  . No Resolved Ambulatory Problems   Past Medical History  Diagnosis Date  . Anxiety   . Meningitis, viral At age 44 and at age 70  . Other and unspecified ovarian cysts 2011- pelvic CT  . Anemia   . Depression   . History of abnormal Pap smear   . Nasal fracture 2011   History  Substance Use Topics  . Smoking status: Former Games developer  . Smokeless tobacco: Not on file  . Alcohol Use: Yes  family history includes Cancer in her mother; Diabetes in  an unspecified family member; Migraines in her father; Rheum arthritis in an unspecified family member; and Seizures in an unspecified family member.  Review of Systems  10 point review of systems have been reviewed. Positive pertinents in hpi. Otherwse, negative    Objective:   Physical Exam BP 130/81  Pulse 97  Temp 98.1 F (36.7 C) (Oral)  Wt 209 lb (94.802 kg) Gen= z x o by 3 in NAD HEENT= [place]/AT, PERRLA, EOMI, no scleral icterus Neck = no nuchal rigiditiy Skin= no rash Psych = slight dysphoria     Assessment & Plan:  HSV-2 recurrent meningitis -can stop valtrex, no need for suppression at this time, not having occurrence frequent enough for need of daily suppression  Abn RMSF titers= will re-check IgG RMSF for  clarification. Patient does not appear ill to suggest active infection  dysthemia/ anxiety/depression = defer to pcp for evaluation and treatment

## 2011-10-26 ENCOUNTER — Other Ambulatory Visit: Payer: 59

## 2011-10-26 DIAGNOSIS — A770 Spotted fever due to Rickettsia rickettsii: Secondary | ICD-10-CM

## 2011-10-27 LAB — ROCKY MTN SPOTTED FVR AB, IGG-BLOOD: RMSF IgG: 0.81 IV — ABNORMAL HIGH

## 2011-11-01 ENCOUNTER — Ambulatory Visit (INDEPENDENT_AMBULATORY_CARE_PROVIDER_SITE_OTHER): Payer: 59 | Admitting: Emergency Medicine

## 2011-11-01 VITALS — BP 114/74 | HR 82 | Temp 98.1°F | Resp 18 | Ht 63.5 in | Wt 206.0 lb

## 2011-11-01 DIAGNOSIS — A879 Viral meningitis, unspecified: Secondary | ICD-10-CM

## 2011-11-01 DIAGNOSIS — G032 Benign recurrent meningitis [Mollaret]: Secondary | ICD-10-CM

## 2011-11-01 DIAGNOSIS — R51 Headache: Secondary | ICD-10-CM

## 2011-11-01 MED ORDER — OXYCODONE-ACETAMINOPHEN 10-325 MG PO TABS: 1.0000 | ORAL_TABLET | ORAL | Status: DC | PRN

## 2011-11-01 MED ORDER — VALACYCLOVIR HCL 1 G PO TABS: 1000.0000 mg | ORAL_TABLET | Freq: Three times a day (TID) | ORAL | Status: DC

## 2011-11-01 NOTE — Progress Notes (Signed)
Date:  11/01/2011   Name:  Angela Farrell   DOB:  04/21/1975   MRN:  [id number]  PCP:  No primary provider on file.    Chief Complaint: Headache   History of Present Illness:  Angela Farrell is a 36 y.o. very pleasant female patient who presents with the following:  History of mollaret's meningitis and treated in June for same with valtrex.  Today developed right frontoparietal headache that is associated with "some" stiff neck.  Says no fever or chills but otherwise this headache is more like her mollaret's headache symptoms.  Has photophobia.  Pain is not as severe as usual.  No neuro or visual symptoms.     Patient Active Problem List  Diagnosis  . Chronic headache disorder  . Obesity  . Allergic rhinitis  . Viral meningitis  . Chronic anemia    Past Medical History  Diagnosis Date  . Anxiety   . Meningitis, viral At age 42 and at age 28  . Other and unspecified ovarian cysts 2011- pelvic CT    GYN- Dr. Chevis Pretty  . Anemia   . Depression   . Chronic headache disorder     s/p Meningitis x 2, tension- type plus allergic/sinus problems  . History of abnormal Pap smear   . Nasal fracture 2011    Past Surgical History  Procedure Date  . Foot surgery s/p 2 procedures  . Laparoscopy for ectopic pregnancy 1998    Fallopian tube removed  . Uterine artery embolization     History  Substance Use Topics  . Smoking status: Former Games developer  . Smokeless tobacco: Not on file  . Alcohol Use: Yes    Family History  Problem Relation Age of Onset  . Migraines Father     cluster headache  . Cancer Mother   . Diabetes      paternal relaitve  . Seizures      maternal relative  . Rheum arthritis      maternal relatives    No Known Allergies  Medication list has been reviewed and updated.  Current Outpatient Prescriptions on File Prior to Visit  Medication Sig Dispense Refill  . Multiple Vitamins-Minerals (MULTIVITAMIN PO) Take by mouth. Take 1 tablet daily with a  meal.      . orlistat (ALLI) 60 MG capsule Take 60 mg by mouth 3 (three) times daily with meals.      . ALPRAZolam (NIRAVAM) 0.5 MG dissolvable tablet Take 1/2 to 1 tablet at bedtime for restful sleep.  20 tablet  0  . cyclobenzaprine (FLEXERIL) 5 MG tablet Take 5 mg by mouth 3 (three) times daily as needed.      Marland Kitchen oxyCODONE-acetaminophen (PERCOCET) 10-325 MG per tablet Take 1 tablet by mouth every 4 (four) hours as needed.      . valACYclovir (VALTREX) 1000 MG tablet Take 1 tablet (1,000 mg total) by mouth 3 (three) times daily.  60 tablet  0    Review of Systems:  As per HPI, otherwise negative.  I have reviewed the patient's medical history in detail and updated the computerized patient record.   Physical Examination: Filed Vitals:   11/01/11 1536  BP: 114/74  Pulse: 82  Temp: 98.1 F (36.7 C)  Resp: 18   Filed Vitals:   11/01/11 1536  Height: 5' 3.5" (1.613 m)  Weight: 206 lb (93.441 kg)   Body mass index is 35.92 kg/(m^2). Ideal Body Weight: Weight in (lb) to have BMI = 25: 143.1  GEN: WDWN, moderate distress, Non-toxic, A & O x 3 HEENT: Atraumatic, Normocephalic. Neck supple. No masses, No LAD. Ears and Nose: No external deformity. CV: RRR, No M/G/R. No JVD. No thrill. No extra heart sounds. PULM: CTA B, no wheezes, crackles, rhonchi. No retractions. No resp. distress. No accessory muscle use. ABD: S, NT, ND, +BS. No rebound. No HSM. EXTR: No c/c/e NEURO Normal gait.  PSYCH: Normally interactive. Conversant. Not depressed or anxious appearing.  Calm demeanor.    Assessment and Plan: Headache Possible recurrence viral meningitis Started on valtrex To follow up with ID tomorrow ER if worse tonight;  Carmelina Dane, MD

## 2012-01-18 ENCOUNTER — Ambulatory Visit (INDEPENDENT_AMBULATORY_CARE_PROVIDER_SITE_OTHER): Payer: 59 | Admitting: Family Medicine

## 2012-01-18 ENCOUNTER — Encounter: Payer: Self-pay | Admitting: Family Medicine

## 2012-01-18 VITALS — BP 114/80 | HR 77 | Temp 98.1°F | Resp 16 | Ht 63.75 in | Wt 204.0 lb

## 2012-01-18 DIAGNOSIS — F419 Anxiety disorder, unspecified: Secondary | ICD-10-CM

## 2012-01-18 DIAGNOSIS — R7309 Other abnormal glucose: Secondary | ICD-10-CM

## 2012-01-18 DIAGNOSIS — F411 Generalized anxiety disorder: Secondary | ICD-10-CM

## 2012-01-18 LAB — GLUCOSE, POCT (MANUAL RESULT ENTRY): POC Glucose: 112 mg/dl — AB (ref 70–99)

## 2012-01-18 MED ORDER — CITALOPRAM HYDROBROMIDE 20 MG PO TABS: ORAL_TABLET | ORAL | Status: DC

## 2012-01-18 MED ORDER — ALPRAZOLAM 0.5 MG PO TBDP: ORAL_TABLET | ORAL | Status: DC

## 2012-01-18 NOTE — Patient Instructions (Addendum)
Anxiety and Panic Attacks Your caregiver has informed you that you are having an anxiety or panic attack. There may be many forms of this. Most of the time these attacks come suddenly and without warning. They come at any time of day, including periods of sleep, and at any time of life. They may be strong and unexplained. Although panic attacks are very scary, they are physically harmless. Sometimes the cause of your anxiety is not known. Anxiety is a protective mechanism of the body in its fight or flight mechanism. Most of these perceived danger situations are actually nonphysical situations (such as anxiety over losing a job). CAUSES  The causes of an anxiety or panic attack are many. Panic attacks may occur in otherwise healthy people given a certain set of circumstances. There may be a genetic cause for panic attacks. Some medications may also have anxiety as a side effect. SYMPTOMS  Some of the most common feelings are:  Intense terror.  Dizziness, feeling faint.  Hot and cold flashes.  Fear of going crazy.  Feelings that nothing is real.  Sweating.  Shaking.  Chest pain or a fast heartbeat (palpitations).  Smothering, choking sensations.  Feelings of impending doom and that death is near.  Tingling of extremities, this may be from over-breathing.  Altered reality (derealization).  Being detached from yourself (depersonalization). Several symptoms can be present to make up anxiety or panic attacks. DIAGNOSIS  The evaluation by your caregiver will depend on the type of symptoms you are experiencing. The diagnosis of anxiety or panic attack is made when no physical illness can be determined to be a cause of the symptoms. TREATMENT  Treatment to prevent anxiety and panic attacks may include:  Avoidance of circumstances that cause anxiety.  Reassurance and relaxation.  Regular exercise.  Relaxation therapies, such as yoga.  Psychotherapy with a psychiatrist or  therapist.  Avoidance of caffeine, alcohol and illegal drugs.  Prescribed medication. SEEK IMMEDIATE MEDICAL CARE IF:   You experience panic attack symptoms that are different than your usual symptoms.  You have any worsening or concerning symptoms. Document Released: 03/20/2005 Document Revised: 06/12/2011 Document Reviewed: 07/22/2009 [facility] Patient Information 2013 [place], [place].    Depression You have signs of depression. This is a common problem. It can occur at any age. It is often hard to recognize. People can suffer from depression and still have moments of enjoyment. Depression interferes with your basic ability to function in life. It upsets your relationships, sleep, eating, and work habits. CAUSES  Depression is believed to be caused by an imbalance in brain chemicals. It may be triggered by an unpleasant event. Relationship crises, a death in the family, financial worries, retirement, or other stressors are normal causes of depression. Depression may also start for no known reason. Other factors that may play a part include medical illnesses, some medicines, genetics, and alcohol or drug abuse. SYMPTOMS   Feeling unhappy or worthless.   Long-lasting (chronic) tiredness or worn-out feeling.   Self-destructive thoughts and actions.   Not being able to sleep or sleeping too much.   Eating more than usual or not eating at all.   Headaches or feeling anxious.   Trouble concentrating or making decisions.   Unexplained physical problems and substance abuse.  TREATMENT  Depression usually gets better with treatment. This can include:  Antidepressant medicines. It can take weeks before the proper dose is achieved and benefits are reached.   Talking with a therapist, clergyperson, counselor, or friend.  These people can help you gain insight into your problem and regain control of your life.   Eating a good diet.   Getting regular physical exercise, such as walking  for 30 minutes every day.   Not abusing alcohol or drugs.  Treating depression often takes 6 months or longer. This length of treatment is needed to keep symptoms from returning. Call your caregiver and arrange for follow-up care as suggested. SEEK IMMEDIATE MEDICAL CARE IF:   You start to have thoughts of hurting yourself or others.   Call your local emergency services (911 in [place]   Go to your local medical emergency department.   Call the National Suicide Prevention Lifeline: 1-800-273-TALK 651-203-6443).  Document Released: 03/20/2005 Document Revised: 03/09/2011 Document Reviewed: 08/20/2009 [facility] Patient Information 2012 [place], [place].

## 2012-01-19 NOTE — Progress Notes (Signed)
S:  This  36 y.o. AA female is here to have form completed for work that indicates she has had labs completed in 2013. She reports ongoing anxiety and insomnia with work-elated stress; she works for the [employer] if [place] in [place]. She is engaged to be married to a man 20 years her senior; it is stable relationship. She has a teenage daughter; their relationship is good. She reports long hx of anxiety and depression ("crying spells for no reason"). She was treated with Effexor years ago but does not think it was effective. Alprazolam for sleep helps a little but it is hard for her to quiet her brain at bedtime.      She has a hx of HSV meningitis and has occasional headaches; she takes Valtrex and this gives her relief.     She is making time for regular physical activity.  ROS: As per HPI; no agitation, behavior problems, SI/HI or thoughts of self-harm.  O:  Filed Vitals:   01/18/12 0951  BP: 114/80  Pulse: 77  Temp: 98.1 F (36.7 C)  Resp: 16   GEN: In NAD; WN,WD. HEENT: [place]/AT; EOMI with injected conj and clear sclerae. Otherwise unremarkable. COR: RRR. LUNGS: Normal resp rate and effort. NEURO: A&O x 3; CNs intact. Mentation- Flat affect and difficulty sitting quietly (jiggles leg).   Results for orders placed in visit on 01/18/12  GLUCOSE, POCT (MANUAL RESULT ENTRY)      Component Value Range   POC Glucose 112 (*) 70 - 99 mg/dl     A/P: 1. Anxiety disorder associated with some depressive symptoms  Insomnia  citalopram (CELEXA) 20 MG tablet   1/2 tablet daily for 1st week or two then increase dose to 1 tablet daily   ALPRAZolam (NIRAVAM) 0.5 MG dissolvable tablet to be taken at bedtime to help with sleep  Hyperglycemia noted on past labs; pt advised about nutrition changes and encouraged to maintain regular exercise habits.  [employer] form completed for pt.

## 2012-02-13 ENCOUNTER — Ambulatory Visit (INDEPENDENT_AMBULATORY_CARE_PROVIDER_SITE_OTHER): Payer: 59 | Admitting: Internal Medicine

## 2012-02-13 VITALS — BP 125/81 | HR 89 | Temp 98.1°F | Resp 18 | Ht 65.0 in | Wt 199.0 lb

## 2012-02-13 DIAGNOSIS — J209 Acute bronchitis, unspecified: Secondary | ICD-10-CM

## 2012-02-13 DIAGNOSIS — J40 Bronchitis, not specified as acute or chronic: Secondary | ICD-10-CM

## 2012-02-13 DIAGNOSIS — R5381 Other malaise: Secondary | ICD-10-CM

## 2012-02-13 MED ORDER — AZITHROMYCIN 500 MG PO TABS: 500.0000 mg | ORAL_TABLET | Freq: Every day | ORAL | Status: DC

## 2012-02-13 MED ORDER — HYDROCODONE-ACETAMINOPHEN 7.5-325 MG/15ML PO SOLN: 15.0000 mL | Freq: Four times a day (QID) | ORAL | Status: DC | PRN

## 2012-02-13 NOTE — Patient Instructions (Addendum)

## 2012-02-13 NOTE — Progress Notes (Signed)
  Subjective:    [name], female    DOB: [birth year], 36 y.o.   MRN: [id number]  HPI Cough, fatigue, green sputum, chest titeness, wheezy. No hx of asthma, smoking. No sob   Review of Systems     Objective:   Physical Exam  Vitals reviewed. Constitutional: She is oriented to person, place, and time. No distress.  HENT:  Right Ear: External ear normal.  Left Ear: External ear normal.  Nose: Mucosal edema, rhinorrhea and sinus tenderness present. Right sinus exhibits no maxillary sinus tenderness and no frontal sinus tenderness. Left sinus exhibits no maxillary sinus tenderness and no frontal sinus tenderness.  Mouth/Throat: Oropharynx is clear and moist.  Eyes: EOM are normal.  Neck: Neck supple.  Cardiovascular: Normal rate, regular rhythm and normal heart sounds.   Pulmonary/Chest: Effort normal. She has wheezes.  Lymphadenopathy:    She has no cervical adenopathy.  Neurological: She is alert and oriented to person, place, and time. Coordination normal.  Psychiatric: She has a normal mood and affect.          Assessment & Plan:  Bronchitis

## 2012-03-19 ENCOUNTER — Encounter: Payer: Self-pay | Admitting: Family Medicine

## 2012-03-19 ENCOUNTER — Ambulatory Visit (INDEPENDENT_AMBULATORY_CARE_PROVIDER_SITE_OTHER): Payer: 59 | Admitting: Family Medicine

## 2012-03-19 VITALS — BP 120/86 | HR 77 | Temp 98.8°F | Resp 16 | Ht 64.0 in | Wt 198.8 lb

## 2012-03-19 DIAGNOSIS — F419 Anxiety disorder, unspecified: Secondary | ICD-10-CM

## 2012-03-19 DIAGNOSIS — R1084 Generalized abdominal pain: Secondary | ICD-10-CM

## 2012-03-19 DIAGNOSIS — R739 Hyperglycemia, unspecified: Secondary | ICD-10-CM

## 2012-03-19 DIAGNOSIS — R7309 Other abnormal glucose: Secondary | ICD-10-CM

## 2012-03-19 DIAGNOSIS — F411 Generalized anxiety disorder: Secondary | ICD-10-CM

## 2012-03-19 MED ORDER — OMEPRAZOLE 20 MG PO CPDR: 20.0000 mg | DELAYED_RELEASE_CAPSULE | Freq: Every day | ORAL | Status: DC

## 2012-03-19 MED ORDER — CITALOPRAM HYDROBROMIDE 20 MG PO TABS: ORAL_TABLET | ORAL | Status: DC

## 2012-03-19 NOTE — Progress Notes (Signed)
S: This 36 y.o. AA female has been treated for anxiety and depression; she is taking Citalopram 20 mg daily and feels better. She feels less anxious and able to cope with everyday stressors. Pt c/o intermittent, crampy flank pain and low back pain. Pt denies heartburn but has had some nausea. BMs seem normal. She has 1 episode of nocturnal pain. She has had GYN eval and was told that these symptoms are not related to her reproductive system. She denies food intolerances. Symptoms have not been accompanied by fever/ chills. She has not tried any OTC medication to alleviate symptoms.  ROS: As per HPI.  O:  Filed Vitals:   03/19/12 1541  BP: 120/86  Pulse: 77  Temp: 98.8 F (37.1 C)  Resp: 16   GEN: In NAD; WN,WD. HEENT: [place]/AT. EOMI w/ clear conj and nonicteric sclerae. Oroph clear w/ normal mucosa (no lesions). NECK: Supple w/o LAN or TMG. COR: RRR w/o m/g/r. No edema. LUNGS: CTA. No wheezes, rales or rhonchi.  ABD: Normal appearance; no distention. Epig and LUQ tenderness. No HSM or masses. No guarding or rebound. NEURO: A&O x 3; CNs intact. Nonfocal.  Results for orders placed in visit on 03/19/12  GLUCOSE, POCT (MANUAL RESULT ENTRY)      Component Value Range   POC Glucose 121 (*) 70 - 99 mg/dl  POCT GLYCOSYLATED HEMOGLOBIN (HGB A1C)      Component Value Range   Hemoglobin A1C 4.8      A/P:  1. Anxiety disorder - stable and improved on current medication citalopram (CELEXA) 20 MG tablet  2. Abdominal pain, generalized - suspect dyspepsia Trial Omeprazole 20 mg 1 tablet every AM before meal.  3. Hyperglycemia  Pt will work on improving nutrition and staying active. Better sleep hygiene may improve her overall well-being.   If no improvement, will check H.pylori IgG antibody. Have also advised minimizing gluten intake.

## 2012-03-19 NOTE — Patient Instructions (Addendum)
Indigestion  Indigestion is discomfort in the upper abdomen that is caused by underlying problems such as gastroesophageal reflux disease (GERD), ulcers, or gallbladder problems.   CAUSES   Indigestion can be caused by many things. Possible causes include:   Stomach acid in the esophagus.   Stomach infections, usually caused by the bacteria H. pylori.   Being overweight.   Hiatal hernia. This means part of the stomach pushes up through the diaphragm.   Overeating.   Emotional problems, such as stress, anxiety, or depression.   Poor nutrition.   Consuming too much alcohol, tobacco, or caffeine.   Consuming spicy foods, fats, peppermint, chocolate, tomato products, citrus, or fruit juices.   Medicines such as aspirin and other anti-inflammatory drugs, hormones, steroids, and thyroid medicines.   Gastroparesis. This is a condition in which the stomach does not empty properly.   Stomach cancer.   Pregnancy, due to an increase in hormone levels, a relaxation of muscles in the digestive tract, and pressure on the stomach from the growing fetus.  SYMPTOMS    Uncomfortable feeling of fullness after eating.   Pain or burning sensation in the upper abdomen.   Bloating.   Belching and gas.   Nausea and vomiting.   Acidic taste in the mouth.   Burning sensation in the chest (heartburn).  DIAGNOSIS   Your caregiver will review your medical history and perform a physical exam. Other tests, such as blood tests, stool tests, X-rays, and other imaging scans, may be done to check for more serious problems.  TREATMENT   Liquid antacids and other drugs may be given to block stomach acid secretion. Medicines that increase esophageal muscle tone may also be given to help reduce symptoms. If an infection is found, antibiotic medicine may be given.  HOME CARE INSTRUCTIONS   Avoid foods and drinks that make your symptoms worse, such as:   Caffeine or alcoholic drinks.   Chocolate.   Peppermint or mint  flavorings.   Garlic and onions.   Spicy foods.   Citrus fruits, such as oranges, lemons, or limes.   Tomato-based foods such as sauce, chili, salsa, and pizza.   Fried and fatty foods.   Avoid eating for the 3 hours prior to your bedtime.   Eat small, frequent meals instead of large meals.   Stop smoking if you smoke.   Maintain a healthy weight.   Wear loose-fitting clothing. Do not wear anything tight around your waist that causes pressure on your stomach.   Raise the head of your bed 4 to 8 inches with wood blocks to help you sleep. Extra pillows will not help.   Only take over-the-counter or prescription medicines as directed by your caregiver.   Do not take aspirin, ibuprofen, or other nonsteroidal anti-inflammatory drugs (NSAIDs).  SEEK IMMEDIATE MEDICAL CARE IF:    You are not better after 2 days.   You have chest pressure or pain that radiates up into your neck, arms, back, jaw, or upper abdomen.   You have difficulty swallowing.   You keep vomiting.   You have black or bloody stools.   You have a fever.   You have dizziness, fainting, difficulty breathing, or heavy sweating.   You have severe abdominal pain.   You lose weight without trying.  MAKE SURE YOU:   Understand these instructions.   Will watch your condition.   Will get help right away if you are not doing well or get worse.  Document Released: 04/27/2004 

## 2012-05-20 ENCOUNTER — Other Ambulatory Visit: Payer: Self-pay | Admitting: Family Medicine

## 2012-07-18 ENCOUNTER — Encounter: Payer: Self-pay | Admitting: Family Medicine

## 2012-09-26 ENCOUNTER — Ambulatory Visit (INDEPENDENT_AMBULATORY_CARE_PROVIDER_SITE_OTHER): Payer: 59 | Admitting: Family Medicine

## 2012-09-26 ENCOUNTER — Encounter: Payer: Self-pay | Admitting: Family Medicine

## 2012-09-26 VITALS — BP 116/76 | HR 86 | Temp 98.1°F | Resp 16 | Ht 63.5 in | Wt 210.0 lb

## 2012-09-26 DIAGNOSIS — R5383 Other fatigue: Secondary | ICD-10-CM

## 2012-09-26 DIAGNOSIS — K3189 Other diseases of stomach and duodenum: Secondary | ICD-10-CM

## 2012-09-26 DIAGNOSIS — F419 Anxiety disorder, unspecified: Secondary | ICD-10-CM

## 2012-09-26 DIAGNOSIS — F411 Generalized anxiety disorder: Secondary | ICD-10-CM

## 2012-09-26 DIAGNOSIS — J309 Allergic rhinitis, unspecified: Secondary | ICD-10-CM

## 2012-09-26 DIAGNOSIS — Z0000 Encounter for general adult medical examination without abnormal findings: Secondary | ICD-10-CM

## 2012-09-26 DIAGNOSIS — R1013 Epigastric pain: Secondary | ICD-10-CM

## 2012-09-26 DIAGNOSIS — K319 Disease of stomach and duodenum, unspecified: Secondary | ICD-10-CM

## 2012-09-26 DIAGNOSIS — R5381 Other malaise: Secondary | ICD-10-CM

## 2012-09-26 LAB — CBC WITH DIFFERENTIAL/PLATELET
Eosinophils Relative: 1 % (ref 0–5)
Hemoglobin: 12 g/dL (ref 12.0–15.0)
Lymphocytes Relative: 38 % (ref 12–46)
Lymphs Abs: 1.8 10*3/uL (ref 0.7–4.0)
MCV: 84.8 fL (ref 78.0–100.0)
Monocytes Relative: 6 % (ref 3–12)
Neutrophils Relative %: 55 % (ref 43–77)
Platelets: 345 10*3/uL (ref 150–400)
RBC: 4.15 MIL/uL (ref 3.87–5.11)
WBC: 4.9 10*3/uL (ref 4.0–10.5)

## 2012-09-26 LAB — COMPREHENSIVE METABOLIC PANEL
ALT: 19 U/L (ref 0–35)
Albumin: 4.7 g/dL (ref 3.5–5.2)
CO2: 23 mEq/L (ref 19–32)
Calcium: 9.1 mg/dL (ref 8.4–10.5)
Chloride: 105 mEq/L (ref 96–112)
Sodium: 135 mEq/L (ref 135–145)
Total Protein: 7.4 g/dL (ref 6.0–8.3)

## 2012-09-26 MED ORDER — CITALOPRAM HYDROBROMIDE 20 MG PO TABS: ORAL_TABLET | ORAL | Status: DC

## 2012-09-26 MED ORDER — ALPRAZOLAM 0.5 MG PO TBDP: ORAL_TABLET | ORAL | Status: DC

## 2012-09-26 MED ORDER — VALACYCLOVIR HCL 1 G PO TABS: 1000.0000 mg | ORAL_TABLET | Freq: Three times a day (TID) | ORAL | Status: DC

## 2012-09-26 NOTE — Patient Instructions (Addendum)
Keeping You Healthy  Get These Tests 1. Blood Pressure- Have your blood pressure checked once a year by your health care provider.  Normal blood pressure is 120/80. 2. Weight- Have your body mass index (BMI) calculated to screen for obesity.  BMI is measure of body fat based on height and weight.  You can also calculate your own BMI at https://www.west-esparza.com/. 3. Cholesterol- Have your cholesterol checked every 5 years starting at age 39 then yearly starting at age 39. 4. Chlamydia, HIV, and other sexually transmitted diseases- Get screened every year until age 39, then within three months of each new sexual provider. 5. Pap Smear- Every 1-3 years; discuss with your health care provider. 6. Mammogram- Every year starting at age 39  Take these medicines  Calcium with Vitamin D-Your body needs 1200 mg of Calcium each day and [phone] IU of Vitamin D daily.  Your body can only absorb 500 mg of Calcium at a time so Calcium must be taken in 2 or 3 divided doses throughout the day.  Multivitamin with folic acid- Once daily if it is possible for you to become pregnant.  Get these Immunizations  Gardasil-Series of three doses; prevents HPV related illness such as genital warts and cervical cancer.  Menactra-Single dose; prevents meningitis.  Tetanus shot- Every 10 years.  Flu shot-Every year.  Take these steps 1. Do not smoke-Your healthcare provider can help you quit.  For tips on how to quit go to www.smokefree.gov or call 1-800 QUITNOW. 2. Be physically active- Exercise 5 days a week for at least 30 minutes.  If you are not already physically active, start slow and gradually work up to 30 minutes of moderate physical activity.  Examples of moderate activity include walking briskly, dancing, swimming, bicycling, etc. 3. Breast Cancer- A self breast exam every month is important for early detection of breast cancer.  For more information and instruction on self breast exams, ask your  healthcare provider or [email or web address]. 4. Eat a healthy diet- Eat a variety of healthy foods such as fruits, vegetables, whole grains, low fat milk, low fat cheeses, yogurt, lean meats, poultry and fish, beans, nuts, tofu, etc.  For more information go to www. Thenutritionsource.org 5. Drink alcohol in moderation- Limit alcohol intake to one drink or less per day. Never drink and drive. 6. Depression- Your emotional health is as important as your physical health.  If you're feeling down or losing interest in things you normally enjoy please talk to your healthcare provider about being screened for depression. 7. Dental visit- Brush and floss your teeth twice daily; visit your dentist twice a year. 8. Eye doctor- Get an eye exam at least every 2 years. 9. Helmet use- Always wear a helmet when riding a bicycle, motorcycle, rollerblading or skateboarding. 10. Safe sex- If you may be exposed to sexually transmitted infections, use a condom. 11. Seat belts- Seat belts can save your live; always wear one. 12. Smoke/Carbon Monoxide detectors- These detectors need to be installed on the appropriate level of your home. Replace batteries at least once a year. 13. Skin cancer- When out in the sun please cover up and use sunscreen 15 SPF or higher. 14. Violence- If anyone is threatening or hurting you, please tell your healthcare provider.   Irritable Bowel Syndrome Irritable Bowel Syndrome (IBS) is caused by a disturbance of normal bowel function. Other terms used are spastic colon, mucous colitis, and irritable colon. It does not require surgery, nor does it lead to cancer.  There is no cure for IBS. But with proper diet, stress reduction, and medication, you will find that your problems (symptoms) will gradually disappear or improve. IBS is a common digestive disorder. It usually appears in late adolescence or early adulthood. Women develop it twice as often as men. CAUSES   After food has been digested and absorbed in the small intestine, waste material is moved into the colon (large intestine). In the colon, water and salts are absorbed from the undigested products coming from the small intestine. The remaining residue, or fecal material, is held for elimination. Under normal circumstances, gentle, rhythmic contractions on the bowel walls push the fecal material along the colon towards the rectum. In IBS, however, these contractions are irregular and poorly coordinated. The fecal material is either retained too long, resulting in constipation, or expelled too soon, producing diarrhea. SYMPTOMS  The most common symptom of IBS is pain. It is typically in the lower left side of the belly (abdomen). But it may occur anywhere in the abdomen. It can be felt as heartburn, backache, or even as a dull pain in the arms or shoulders. The pain comes from excessive bowel-muscle spasms and from the buildup of gas and fecal material in the colon. This pain:  Can range from sharp belly (abdominal) cramps to a dull, continuous ache.  Usually worsens soon after eating.  Is typically relieved by having a bowel movement or passing gas. Abdominal pain is usually accompanied by constipation. But it may also produce diarrhea. The diarrhea typically occurs right after a meal or upon arising in the morning. The stools are typically soft and watery. They are often flecked with secretions (mucus). Other symptoms of IBS include:  Bloating.  Loss of appetite.  Heartburn.  Feeling sick to your stomach (nausea).  Belching  Vomiting  Gas. IBS may also cause a number of symptoms that are unrelated to the digestive system:  Fatigue.  Headaches.  Anxiety  Shortness of breath  Difficulty in concentrating.  Dizziness. These symptoms tend to come and go. DIAGNOSIS  The symptoms of IBS closely mimic the symptoms of other, more serious digestive disorders. So your caregiver may wish  to perform a variety of additional tests to exclude these disorders. He/she wants to be certain of learning what is wrong (diagnosis). The nature and purpose of each test will be explained to you. TREATMENT A number of medications are available to help correct bowel function and/or relieve bowel spasms and abdominal pain. Among the drugs available are:  Mild, non-irritating laxatives for severe constipation and to help restore normal bowel habits.  Specific anti-diarrheal medications to treat severe or prolonged diarrhea.  Anti-spasmodic agents to relieve intestinal cramps.  Your caregiver may also decide to treat you with a mild tranquilizer or sedative during unusually stressful periods in your life. The important thing to remember is that if any drug is prescribed for you, make sure that you take it exactly as directed. Make sure that your caregiver knows how well it worked for you. HOME CARE INSTRUCTIONS   Avoid foods that are high in fat or oils. Some examples [id number] cream, butter, frankfurters, sausage, and other fatty meats.  Avoid foods that have a laxative effect, such as fruit, fruit juice, and dairy products.  Cut out carbonated drinks, chewing gum, and "gassy" foods, such as beans and cabbage. This may help relieve bloating and belching.  Bran taken with plenty of liquids may help relieve constipation.  Keep track of what foods seem  to trigger your symptoms.  Avoid emotionally charged situations or circumstances that produce anxiety.  Start or continue exercising.  Get plenty of rest and sleep. MAKE SURE YOU:   Understand these instructions.  Will watch your condition.  Will get help right away if you are not doing well or get worse. Document Released: 03/20/2005 Document Revised: 06/12/2011 Document Reviewed: 11/08/2007 [facility] Patient Information 2014 [place], [place].   Helicobacter Pylori Disease Often patients with stomach or duodenal ulcers not caused by  irritants, are infected with a germ. The germ is called helicobacter pylori (H. pylori). This bacterium lives on the surface of stomach and small bowel. It can cause redness, soreness and ulcers. Ulcers are a hole in the lining of your stomach or small bowel. Blood and special breath tests can detect if you are infected with H. pylori. Tests can be done on samples taken from the stomach if you have endoscopy. After treatment you may have tests to prove you are cured. These can be done about a month after you finish the treatment or as your caregiver suggests. Most infections can be cured with a combination of antibiotics. Antibiotics are medications which kill germs such as H. pylori. Anti-ulcer medicines which block stomach acid secretion may also be used. Treatment will be continued for the time your caregiver suggests. Call your caregiver if you need more information about H. pylori. Call also if your symptoms get worse during or after treatment. You will not need a special diet. Avoid:  Smoking.  Aspirin.  Ibuprofen.  Other anti-inflammatory drugs. Alcohol and spicy foods may also make your symptoms worse. The best advice is to avoid anything you find upsetting to your stomach. SEEK IMMEDIATE MEDICAL CARE IF:  You develop sharp, sudden, lasting stomach pain.  You have bloody vomit or vomit that looks like coffee grounds.  You have bloody or black stools.  You develop a lightheaded feeling, fainting, or become weak and sweaty. Document Released: 03/20/2005 Document Revised: 06/12/2011 Document Reviewed: 09/05/2006 [facility] Patient Information 2013 [place], [place].

## 2012-09-26 NOTE — Progress Notes (Signed)
Subjective:    [name], female    DOB: Aug 07, 1975, 37 y.o.   MRN: [id number]  HPI  This 37 y.o. AA female is here for CPE; PAP performed 09/19/12 at GYN office.  MMG 2013- normal; pt does do BSE.  Weight loss plan includes taking Hydroxycut 2x/day; she  reports weight loss= 6 pounds. She walks daily for 30-40 minutes.   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  This medication is effective for aborting most HAs.     Review of Systems  Constitutional: Positive for diaphoresis, activity change, appetite change, fatigue and unexpected weight change.       Pt has started weight loss program and OTC supplement.  HENT: Positive for congestion, neck pain, neck stiffness, postnasal drip and sinus pressure.        Takes OTC Claritin.  Eyes: Positive for itching and visual disturbance.  Respiratory: Negative.   Cardiovascular: Negative.   Gastrointestinal: Positive for abdominal pain, constipation and abdominal distention.       With all meals; has occasional diarrhea. Takes Prilosec daily which is mildly effective.  Endocrine: Negative.   Musculoskeletal: Positive for back pain, joint swelling and arthralgias.  Neurological: Positive for headaches. Negative for syncope, facial asymmetry, speech difficulty, weakness and numbness.  Psychiatric/Behavioral: Negative.        Objective:   Physical Exam  Nursing note and vitals reviewed. Constitutional: She is oriented to person, place, and time. Vital signs are normal. She appears well-developed and well-nourished. No distress.  HENT:  Head: Normocephalic and atraumatic.  Right Ear: Hearing and external ear normal. Tympanic membrane is scarred. Tympanic membrane is not injected, not erythematous and not bulging. No middle ear effusion.  Left Ear: Hearing, external ear and ear canal normal. Tympanic membrane is scarred. Tympanic membrane is not injected, not erythematous and not  bulging. A middle ear effusion is present.  Nose: Mucosal edema present. No rhinorrhea, nasal deformity or septal deviation. Right sinus exhibits no maxillary sinus tenderness and no frontal sinus tenderness. Left sinus exhibits no maxillary sinus tenderness and no frontal sinus tenderness.  Mouth/Throat: Uvula is midline and mucous membranes are normal. No oral lesions. Normal dentition. No dental caries. Posterior oropharyngeal erythema present. No oropharyngeal exudate.  Eyes: Conjunctivae, EOM and lids are normal. Pupils are equal, round, and reactive to light. No scleral icterus.  Fundoscopic exam:      The right eye shows no arteriolar narrowing, no AV nicking and no papilledema. The right eye shows red reflex.       The left eye shows no arteriolar narrowing, no AV nicking and no papilledema. The left eye shows red reflex.  Neck: Normal range of motion. Neck supple. No thyromegaly present.  Cardiovascular: Normal rate, regular rhythm and normal heart sounds.  Exam reveals no gallop and no friction rub.   No murmur heard. Pulmonary/Chest: Effort normal and breath sounds normal. No respiratory distress. She has no wheezes.  Abdominal: Soft. Normal appearance and bowel sounds are normal. She exhibits no distension and no mass. There is no hepatosplenomegaly. There is no tenderness. There is no guarding and no CVA tenderness.  Musculoskeletal: Normal range of motion. She exhibits no edema and no tenderness.  Lymphadenopathy:    She has no cervical adenopathy.  Neurological: She is alert and oriented to person, place, and time. She has normal strength and normal reflexes. She displays no atrophy. No cranial nerve deficit or sensory deficit. She exhibits  normal muscle tone. Coordination and gait normal.  Skin: Skin is warm and dry. No rash noted. No cyanosis or erythema. Nails show no clubbing.  Psychiatric: She has a normal mood and affect. Her behavior is normal. Judgment and thought content  normal.        Assessment & Plan:  Routine general medical examination at a health care facility - Plan: TSH, T4, Free  Other malaise and fatigue - Plan: CBC with Differential, Comprehensive metabolic panel, TSH, T4, Free  Dyspepsia and disorder of function of stomach - Plan: H. pylori antibody, IgG; continue Prilosec.  Allergic rhinitis- Take OTC antihistamine daily; get OTC Similisan allergy eye drops and use daily.  Anxiety disorder - Plan: citalopram (CELEXA) 20 MG tablet, ALPRAZolam (NIRAVAM) 0.5 MG dissolvable tablet, TSH, T4, Free, CANCELED: TSH + free T4   Meds ordered this encounter  Medications  . DICLOFENAC PO    Sig: Take by mouth.  . citalopram (CELEXA) 20 MG tablet    Sig: Take 1 tablet every AM for anxiety.    Dispense:  30 tablet    Refill:  5  . valACYclovir (VALTREX) 1000 MG tablet    Sig: Take 1 tablet (1,000 mg total) by mouth 3 (three) times daily.    Dispense:  60 tablet    Refill:  5    First dose at 4pm tonight  . ALPRAZolam (NIRAVAM) 0.5 MG dissolvable tablet    Sig: Take 1 tablet at bedtime for restful sleep.    Dispense:  30 tablet    Refill:  0

## 2012-09-27 LAB — H. PYLORI ANTIBODY, IGG: H Pylori IgG: 0.6 {ISR}

## 2012-10-01 NOTE — Progress Notes (Signed)
Quick Note:  Please notify pt that results are normal.   Provide pt with copy of labs. ______ 

## 2012-11-09 ENCOUNTER — Other Ambulatory Visit: Payer: Self-pay | Admitting: Family Medicine

## 2012-11-26 ENCOUNTER — Ambulatory Visit (INDEPENDENT_AMBULATORY_CARE_PROVIDER_SITE_OTHER): Payer: 59 | Admitting: Family Medicine

## 2012-11-26 ENCOUNTER — Encounter: Payer: Self-pay | Admitting: Family Medicine

## 2012-11-26 VITALS — BP 129/87 | HR 81 | Temp 98.0°F | Resp 16 | Ht 64.0 in | Wt 217.0 lb

## 2012-11-26 DIAGNOSIS — R5381 Other malaise: Secondary | ICD-10-CM

## 2012-11-26 DIAGNOSIS — R1013 Epigastric pain: Secondary | ICD-10-CM

## 2012-11-26 DIAGNOSIS — K3189 Other diseases of stomach and duodenum: Secondary | ICD-10-CM

## 2012-11-26 NOTE — Patient Instructions (Addendum)
Citalopram is associated with increased sleepiness; you may try taking this medication at bedtime or reducing the dose to 1 tablet alternating with 1/2 tablet every other day to see if that helps at all. I will contact you in a few days with the results of your labs.

## 2012-11-26 NOTE — Progress Notes (Signed)
S:  This 37 y.o. AA female returns for evaluation of chronic fatigue; she feels like she is not getting enough sleep. She sleeps well and has no problem going to sleep. Upon awakening in the morning, she goes through her routine and still feels like she could sleep a few more hours. Some mornings, she does lay down on the bed before getting dressed. On the weekends, she sleeps a lot. She still has some mild stomach discomfort but has not been febrile, had n/v/d or muscle weakness or pain. She has been going to Yoga classes twice a week and has some muscle soreness associated w/ that fitness activity.  Patient Active Problem List   Diagnosis Date Noted  . Chronic headache disorder 04/18/2011    Priority: Medium  . Anxiety disorder 01/18/2012  . Chronic anemia 10/14/2011  . Viral meningitis 09/26/2011  . Obesity 04/18/2011  . Allergic rhinitis 04/18/2011    Class: Chronic   PMHx, Soc Hx and Fam Hx reviewed. Medications reconciled.  ROS: As per HPI; otherwise noncontributory.  O: Filed Vitals:   11/26/12 1536  BP: 129/87  Pulse: 81  Temp: 98 F (36.7 C)  Resp: 16   GEN: In NAD; WN,WD. Not diaphoretic. HENT: [place]/AT; EOMI w/ injected conj and clear sclerae. Otherwise unremarkable. COR: RRR. No edema. LUNGS: Normal resp rate and effort. SKIN: W&D. Intact. No rashes, cyanosis or clubbing. MS: MAEs; no deformities or muscle atrophy. NEURO: A&O x 3; CNs intact. Motor function- 4+/5. Sensory intact. Gait normal.  A/P: Other malaise and fatigue - Consider medication side effect. Advised pt to take Citalopram at bedtime and try dose reduction by alternating 1 tab w/ 1/2 tab every other day. Plan: Vitamin D, 25-hydroxy, ANA  Dyspepsia

## 2012-11-28 ENCOUNTER — Ambulatory Visit: Payer: 59 | Admitting: Family Medicine

## 2012-11-28 NOTE — Progress Notes (Signed)
Quick Note:  Please contact pt and advise that the following labs are abnormal... Vitamin D level is still below normal. Daily dose needs to be 2000 IU daily in addition to sun exposure and Vitamin D-rich foods.  Copy to pt. ______

## 2012-11-29 ENCOUNTER — Encounter: Payer: Self-pay | Admitting: Radiology

## 2012-12-07 ENCOUNTER — Other Ambulatory Visit: Payer: Self-pay | Admitting: Family Medicine

## 2012-12-20 ENCOUNTER — Ambulatory Visit (INDEPENDENT_AMBULATORY_CARE_PROVIDER_SITE_OTHER): Payer: 59 | Admitting: Internal Medicine

## 2012-12-20 ENCOUNTER — Telehealth: Payer: Self-pay

## 2012-12-20 VITALS — BP 132/94 | HR 112 | Temp 98.4°F | Resp 19 | Ht 64.0 in | Wt 216.0 lb

## 2012-12-20 DIAGNOSIS — R1110 Vomiting, unspecified: Secondary | ICD-10-CM

## 2012-12-20 DIAGNOSIS — J329 Chronic sinusitis, unspecified: Secondary | ICD-10-CM

## 2012-12-20 DIAGNOSIS — J9801 Acute bronchospasm: Secondary | ICD-10-CM

## 2012-12-20 DIAGNOSIS — J209 Acute bronchitis, unspecified: Secondary | ICD-10-CM

## 2012-12-20 MED ORDER — ONDANSETRON HCL 8 MG PO TABS: 8.0000 mg | ORAL_TABLET | Freq: Three times a day (TID) | ORAL | Status: DC | PRN

## 2012-12-20 MED ORDER — ALBUTEROL SULFATE HFA 108 (90 BASE) MCG/ACT IN AERS: 2.0000 | INHALATION_SPRAY | Freq: Four times a day (QID) | RESPIRATORY_TRACT | Status: DC | PRN

## 2012-12-20 MED ORDER — AMOXICILLIN 500 MG PO CAPS: 1000.0000 mg | ORAL_CAPSULE | Freq: Two times a day (BID) | ORAL | Status: DC

## 2012-12-20 MED ORDER — HYDROCODONE-ACETAMINOPHEN 7.5-325 MG/15ML PO SOLN: 5.0000 mL | Freq: Four times a day (QID) | ORAL | Status: DC | PRN

## 2012-12-20 NOTE — Telephone Encounter (Signed)
Patient was seen today (12/20/2012) and states that she was supposed to have an inhaler sent to [facility] on [address] [phone]

## 2012-12-20 NOTE — Patient Instructions (Addendum)
Nausea and Vomiting Nausea means you feel sick to your stomach. Throwing up (vomiting) is a reflex where stomach contents come out of your mouth. HOME CARE   Take medicine as told by your doctor.  Do not force yourself to eat. However, you do need to drink fluids.  If you feel like eating, eat a normal diet as told by your doctor.  Eat rice, wheat, potatoes, bread, lean meats, yogurt, fruits, and vegetables.  Avoid high-fat foods.  Drink enough fluids to keep your pee (urine) clear or pale yellow.  Ask your doctor how to replace body fluid losses (rehydrate). Signs of body fluid loss (dehydration) include:  Feeling very thirsty.  Dry lips and mouth.  Feeling dizzy.  Dark pee.  Peeing less than normal.  Feeling confused.  Fast breathing or heart rate. GET HELP RIGHT AWAY IF:   You have blood in your throw up.  You have black or bloody poop (stool).  You have a bad headache or stiff neck.  You feel confused.  You have bad belly (abdominal) pain.  You have chest pain or trouble breathing.  You do not pee at least once every 8 hours.  You have cold, clammy skin.  You keep throwing up after 24 to 48 hours.  You have a fever. MAKE SURE YOU:   Understand these instructions.  Will watch your condition.  Will get help right away if you are not doing well or get worse. Document Released: 09/06/2007 Document Revised: 06/12/2011 Document Reviewed: 08/19/2010 [facility] Patient Information 2014 [place], [place]. Bronchitis Bronchitis is the body's way of reacting to injury and/or infection (inflammation) of the bronchi. Bronchi are the air tubes that extend from the windpipe into the lungs. If the inflammation becomes severe, it may cause shortness of breath. CAUSES  Inflammation may be caused by:  A virus.  Germs (bacteria).  Dust.  Allergens.  Pollutants and many other irritants. The cells lining the bronchial tree are covered with tiny hairs (cilia).  These constantly beat upward, away from the lungs, toward the mouth. This keeps the lungs free of pollutants. When these cells become too irritated and are unable to do their job, mucus begins to develop. This causes the characteristic cough of bronchitis. The cough clears the lungs when the cilia are unable to do their job. Without either of these protective mechanisms, the mucus would settle in the lungs. Then you would develop pneumonia. Smoking is a common cause of bronchitis and can contribute to pneumonia. Stopping this habit is the single most important thing you can do to help yourself. TREATMENT   Your caregiver may prescribe an antibiotic if the cough is caused by bacteria. Also, medicines that open up your airways make it easier to breathe. Your caregiver may also recommend or prescribe an expectorant. It will loosen the mucus to be coughed up. Only take over-the-counter or prescription medicines for pain, discomfort, or fever as directed by your caregiver.  Removing whatever causes the problem (smoking, for example) is critical to preventing the problem from getting worse.  Cough suppressants may be prescribed for relief of cough symptoms.  Inhaled medicines may be prescribed to help with symptoms now and to help prevent problems from returning.  For those with recurrent (chronic) bronchitis, there may be a need for steroid medicines. SEEK IMMEDIATE MEDICAL CARE IF:   During treatment, you develop more pus-like mucus (purulent sputum).  You have a fever.  Your baby is older than 3 months with a rectal temperature  of 102 F (38.9 C) or higher.  Your baby is 73 months old or younger with a rectal temperature of 100.4 F (38 C) or higher.  You become progressively more ill.  You have increased difficulty breathing, wheezing, or shortness of breath. It is necessary to seek immediate medical care if you are elderly or sick from any other disease. MAKE SURE YOU:   Understand these  instructions.  Will watch your condition.  Will get help right away if you are not doing well or get worse. Document Released: 03/20/2005 Document Revised: 06/12/2011 Document Reviewed: 01/28/2008 [facility] Patient Information 2014 [place], [place]. Sinusitis Sinusitis is redness, soreness, and swelling (inflammation) of the paranasal sinuses. Paranasal sinuses are air pockets within the bones of your face (beneath the eyes, the middle of the forehead, or above the eyes). In healthy paranasal sinuses, mucus is able to drain out, and air is able to circulate through them by way of your nose. However, when your paranasal sinuses are inflamed, mucus and air can become trapped. This can allow bacteria and other germs to grow and cause infection. Sinusitis can develop quickly and last only a short time (acute) or continue over a long period (chronic). Sinusitis that lasts for more than 12 weeks is considered chronic.  CAUSES  Causes of sinusitis include:  Allergies.  Structural abnormalities, such as displacement of the cartilage that separates your nostrils (deviated septum), which can decrease the air flow through your nose and sinuses and affect sinus drainage.  Functional abnormalities, such as when the small hairs (cilia) that line your sinuses and help remove mucus do not work properly or are not present. SYMPTOMS  Symptoms of acute and chronic sinusitis are the same. The primary symptoms are pain and pressure around the affected sinuses. Other symptoms include:  Upper toothache.  Earache.  Headache.  Bad breath.  Decreased sense of smell and taste.  A cough, which worsens when you are lying flat.  Fatigue.  Fever.  Thick drainage from your nose, which often is green and may contain pus (purulent).  Swelling and warmth over the affected sinuses. DIAGNOSIS  Your caregiver will perform a physical exam. During the exam, your caregiver may:  Look in your nose for signs of  abnormal growths in your nostrils (nasal polyps).  Tap over the affected sinus to check for signs of infection.  View the inside of your sinuses (endoscopy) with a special imaging device with a light attached (endoscope), which is inserted into your sinuses. If your caregiver suspects that you have chronic sinusitis, one or more of the following tests may be recommended:  Allergy tests.  Nasal culture A sample of mucus is taken from your nose and sent to a lab and screened for bacteria.  Nasal cytology A sample of mucus is taken from your nose and examined by your caregiver to determine if your sinusitis is related to an allergy. TREATMENT  Most cases of acute sinusitis are related to a viral infection and will resolve on their own within 10 days. Sometimes medicines are prescribed to help relieve symptoms (pain medicine, decongestants, nasal steroid sprays, or saline sprays).  However, for sinusitis related to a bacterial infection, your caregiver will prescribe antibiotic medicines. These are medicines that will help kill the bacteria causing the infection.  Rarely, sinusitis is caused by a fungal infection. In theses cases, your caregiver will prescribe antifungal medicine. For some cases of chronic sinusitis, surgery is needed. Generally, these are cases in which sinusitis recurs  more than 3 times per year, despite other treatments. HOME CARE INSTRUCTIONS   Drink plenty of water. Water helps thin the mucus so your sinuses can drain more easily.  Use a humidifier.  Inhale steam 3 to 4 times a day (for example, sit in the bathroom with the shower running).  Apply a warm, moist washcloth to your face 3 to 4 times a day, or as directed by your caregiver.  Use saline nasal sprays to help moisten and clean your sinuses.  Take over-the-counter or prescription medicines for pain, discomfort, or fever only as directed by your caregiver. SEEK IMMEDIATE MEDICAL CARE IF:  You have increasing  pain or severe headaches.  You have nausea, vomiting, or drowsiness.  You have swelling around your face.  You have vision problems.  You have a stiff neck.  You have difficulty breathing. MAKE SURE YOU:   Understand these instructions.  Will watch your condition.  Will get help right away if you are not doing well or get worse. Document Released: 03/20/2005 Document Revised: 06/12/2011 Document Reviewed: 04/04/2011 [facility] Patient Information 2014 [place], [place].

## 2012-12-20 NOTE — Progress Notes (Signed)
  Subjective:    [name], female    DOB: [birth year], 37 y.o.   MRN: [id number]  HPI Daughter sick with same illness. Cough, congestion, sinus pain, vomiting. Not dehydrated.   Review of Systems     Objective:   Physical Exam  Constitutional: She is oriented to person, place, and time. She appears well-developed and well-nourished.  HENT:  Right Ear: External ear normal.  Left Ear: External ear normal.  Nose: Mucosal edema, rhinorrhea and sinus tenderness present. Right sinus exhibits maxillary sinus tenderness and frontal sinus tenderness. Left sinus exhibits no maxillary sinus tenderness and no frontal sinus tenderness.  Mouth/Throat: Oropharynx is clear and moist.  Eyes: EOM are normal.  Neck: Normal range of motion. Neck supple. No thyromegaly present.  Cardiovascular: Normal rate.   Pulmonary/Chest: Effort normal. No respiratory distress. She has wheezes. She has no rales.  Musculoskeletal: Normal range of motion.  Lymphadenopathy:    She has no cervical adenopathy.  Neurological: She is alert and oriented to person, place, and time. She exhibits normal muscle tone. Coordination normal.  Psychiatric: She has a normal mood and affect.          Assessment & Plan:  Sinusitis/bronchitis/Vomiting

## 2013-03-19 ENCOUNTER — Telehealth: Payer: Self-pay

## 2013-03-19 NOTE — Telephone Encounter (Signed)
Called patient. She states she has viral meningitis which comes and goes, she indicates she is having headaches, started last Thursday, wants something for pain.

## 2013-03-19 NOTE — Telephone Encounter (Signed)
PT WOULD LIKE TO SPEAK WITH DR [facility] ABOUT VIRAL MENINGITIS. PLEASE CALL [phone]

## 2013-03-20 MED ORDER — HYDROCODONE-ACETAMINOPHEN 5-325 MG PO TABS: ORAL_TABLET | ORAL | Status: DC

## 2013-03-20 NOTE — Telephone Encounter (Signed)
I am familiar with this pt; I will print out Hydrocodone- APAP 5-325 mg tabs #30  1 tablet every 6-8 hours prn HA.  She can pick it up at [address] [facility].

## 2013-03-20 NOTE — Telephone Encounter (Signed)
Thanks patient advised.  

## 2013-05-07 ENCOUNTER — Other Ambulatory Visit: Payer: Self-pay | Admitting: Family Medicine

## 2013-05-07 NOTE — Telephone Encounter (Signed)
Pt needs OV for further refills.  

## 2013-06-08 ENCOUNTER — Other Ambulatory Visit: Payer: Self-pay | Admitting: Family Medicine

## 2013-06-09 ENCOUNTER — Other Ambulatory Visit: Payer: Self-pay | Admitting: Physician Assistant

## 2013-07-08 ENCOUNTER — Other Ambulatory Visit: Payer: Self-pay | Admitting: Physician Assistant

## 2013-07-09 ENCOUNTER — Other Ambulatory Visit: Payer: Self-pay | Admitting: Emergency Medicine

## 2013-07-09 ENCOUNTER — Emergency Department (HOSPITAL_COMMUNITY)
Admission: EM | Admit: 2013-07-09 | Discharge: 2013-07-09 | Disposition: A | Discharge status: Home / Self Care | Payer: 59 | Attending: Emergency Medicine | Admitting: Emergency Medicine

## 2013-07-09 ENCOUNTER — Ambulatory Visit (INDEPENDENT_AMBULATORY_CARE_PROVIDER_SITE_OTHER): Payer: 59 | Admitting: Emergency Medicine

## 2013-07-09 ENCOUNTER — Encounter (HOSPITAL_COMMUNITY): Payer: Self-pay | Admitting: Emergency Medicine

## 2013-07-09 VITALS — BP 132/68 | HR 119 | Temp 98.5°F | Resp 18 | Ht 63.0 in | Wt 215.0 lb

## 2013-07-09 DIAGNOSIS — Z862 Personal history of diseases of the blood and blood-forming organs and certain disorders involving the immune mechanism: Secondary | ICD-10-CM | POA: Insufficient documentation

## 2013-07-09 DIAGNOSIS — Z8619 Personal history of other infectious and parasitic diseases: Secondary | ICD-10-CM | POA: Insufficient documentation

## 2013-07-09 DIAGNOSIS — F3289 Other specified depressive episodes: Secondary | ICD-10-CM | POA: Insufficient documentation

## 2013-07-09 DIAGNOSIS — R112 Nausea with vomiting, unspecified: Secondary | ICD-10-CM | POA: Insufficient documentation

## 2013-07-09 DIAGNOSIS — R197 Diarrhea, unspecified: Secondary | ICD-10-CM | POA: Insufficient documentation

## 2013-07-09 DIAGNOSIS — F329 Major depressive disorder, single episode, unspecified: Secondary | ICD-10-CM | POA: Insufficient documentation

## 2013-07-09 DIAGNOSIS — R51 Headache: Secondary | ICD-10-CM | POA: Insufficient documentation

## 2013-07-09 DIAGNOSIS — IMO0001 Reserved for inherently not codable concepts without codable children: Secondary | ICD-10-CM | POA: Insufficient documentation

## 2013-07-09 DIAGNOSIS — R1084 Generalized abdominal pain: Secondary | ICD-10-CM | POA: Insufficient documentation

## 2013-07-09 DIAGNOSIS — G8929 Other chronic pain: Secondary | ICD-10-CM | POA: Insufficient documentation

## 2013-07-09 DIAGNOSIS — R000 Tachycardia, unspecified: Secondary | ICD-10-CM | POA: Insufficient documentation

## 2013-07-09 DIAGNOSIS — Z79899 Other long term (current) drug therapy: Secondary | ICD-10-CM | POA: Insufficient documentation

## 2013-07-09 DIAGNOSIS — M542 Cervicalgia: Secondary | ICD-10-CM | POA: Insufficient documentation

## 2013-07-09 DIAGNOSIS — M791 Myalgia, unspecified site: Secondary | ICD-10-CM

## 2013-07-09 DIAGNOSIS — R509 Fever, unspecified: Secondary | ICD-10-CM | POA: Insufficient documentation

## 2013-07-09 DIAGNOSIS — H53149 Visual discomfort, unspecified: Secondary | ICD-10-CM | POA: Insufficient documentation

## 2013-07-09 DIAGNOSIS — Z8742 Personal history of other diseases of the female genital tract: Secondary | ICD-10-CM | POA: Insufficient documentation

## 2013-07-09 DIAGNOSIS — Z87891 Personal history of nicotine dependence: Secondary | ICD-10-CM | POA: Insufficient documentation

## 2013-07-09 DIAGNOSIS — M2550 Pain in unspecified joint: Secondary | ICD-10-CM | POA: Insufficient documentation

## 2013-07-09 DIAGNOSIS — Z3202 Encounter for pregnancy test, result negative: Secondary | ICD-10-CM | POA: Insufficient documentation

## 2013-07-09 DIAGNOSIS — Z8781 Personal history of (healed) traumatic fracture: Secondary | ICD-10-CM | POA: Insufficient documentation

## 2013-07-09 DIAGNOSIS — F411 Generalized anxiety disorder: Secondary | ICD-10-CM | POA: Insufficient documentation

## 2013-07-09 DIAGNOSIS — Z8739 Personal history of other diseases of the musculoskeletal system and connective tissue: Secondary | ICD-10-CM | POA: Insufficient documentation

## 2013-07-09 DIAGNOSIS — R1110 Vomiting, unspecified: Secondary | ICD-10-CM

## 2013-07-09 DIAGNOSIS — H5710 Ocular pain, unspecified eye: Secondary | ICD-10-CM | POA: Insufficient documentation

## 2013-07-09 LAB — COMPREHENSIVE METABOLIC PANEL
ALK PHOS: 49 U/L (ref 39–117)
ALT: 30 U/L (ref 0–35)
ALT: 30 U/L (ref 0–35)
AST: 22 U/L (ref 0–37)
AST: 21 U/L (ref 0–37)
Albumin: 4.3 g/dL (ref 3.5–5.2)
Albumin: 3.9 g/dL (ref 3.5–5.2)
Alkaline Phosphatase: 43 U/L (ref 39–117)
BILIRUBIN TOTAL: 1.7 mg/dL — AB (ref 0.2–1.2)
BILIRUBIN TOTAL: 1.5 mg/dL — AB (ref 0.3–1.2)
BUN: 12 mg/dL (ref 6–23)
BUN: 11 mg/dL (ref 6–23)
CALCIUM: 8.8 mg/dL (ref 8.4–10.5)
CHLORIDE: 103 meq/L (ref 96–112)
CHLORIDE: 99 meq/L (ref 96–112)
CO2: 23 mEq/L (ref 19–32)
CO2: 22 mEq/L (ref 19–32)
Calcium: 8.6 mg/dL (ref 8.4–10.5)
Creat: 0.64 mg/dL (ref 0.50–1.10)
Creatinine, Ser: 0.72 mg/dL (ref 0.50–1.10)
GFR calc non Af Amer: >90 mL/min (ref >90)
GLUCOSE: 103 mg/dL — AB (ref 70–99)
Glucose, Bld: 109 mg/dL — ABNORMAL HIGH (ref 70–99)
POTASSIUM: 3.7 meq/L (ref 3.7–5.3)
Potassium: 4 mEq/L (ref 3.5–5.3)
Sodium: 136 mEq/L (ref 135–145)
Sodium: 135 mEq/L — ABNORMAL LOW (ref 137–147)
Total Protein: 6.9 g/dL (ref 6.0–8.3)
Total Protein: 7.4 g/dL (ref 6.0–8.3)

## 2013-07-09 LAB — URINALYSIS, ROUTINE W REFLEX MICROSCOPIC
Bilirubin Urine: NEGATIVE
GLUCOSE, UA: NEGATIVE mg/dL
Hgb urine dipstick: NEGATIVE
Ketones, ur: NEGATIVE mg/dL
Leukocytes, UA: NEGATIVE
NITRITE: NEGATIVE
PH: 7 (ref 5.0–8.0)
Protein, ur: NEGATIVE mg/dL
Specific Gravity, Urine: 1.025 (ref 1.005–1.030)
Urobilinogen, UA: 1 mg/dL (ref 0.0–1.0)

## 2013-07-09 LAB — CBC WITH DIFFERENTIAL/PLATELET
BASOS PCT: 0 % (ref 0–1)
Basophils Absolute: 0 10*3/uL (ref 0.0–0.1)
Eosinophils Absolute: 0 10*3/uL (ref 0.0–0.7)
Eosinophils Relative: 0 % (ref 0–5)
HCT: 32.4 % — ABNORMAL LOW (ref 36.0–46.0)
HEMOGLOBIN: 11 g/dL — AB (ref 12.0–15.0)
LYMPHS ABS: 0.5 10*3/uL — AB (ref 0.7–4.0)
Lymphocytes Relative: 12 % (ref 12–46)
MCH: 29.3 pg (ref 26.0–34.0)
MCHC: 34 g/dL (ref 30.0–36.0)
MCV: 86.2 fL (ref 78.0–100.0)
MONOS PCT: 5 % (ref 3–12)
Monocytes Absolute: 0.2 10*3/uL (ref 0.1–1.0)
NEUTROS PCT: 83 % — AB (ref 43–77)
Neutro Abs: 3.3 10*3/uL (ref 1.7–7.7)
PLATELETS: 264 10*3/uL (ref 150–400)
RBC: 3.76 MIL/uL — ABNORMAL LOW (ref 3.87–5.11)
RDW: 13.4 % (ref 11.5–15.5)
WBC: 4 10*3/uL (ref 4.0–10.5)

## 2013-07-09 LAB — POCT CBC
Granulocyte percent: 82.8 %G — AB (ref 37–80)
HCT, POC: 32.7 % — AB (ref 37.7–47.9)
Hemoglobin: 11.9 g/dL — AB (ref 12.2–16.2)
Lymph, poc: 0.6 (ref 0.6–3.4)
MCH, POC: 28.5 pg (ref 27–31.2)
MCHC: 31.2 g/dL — AB (ref 31.8–35.4)
MCV: 91.4 fL (ref 80–97)
MID (cbc): 0.2 (ref 0–0.9)
MPV: 9 fL (ref 0–99.8)
PLATELET COUNT, POC: 304 10*3/uL (ref 142–424)
POC Granulocyte: 3.9 (ref 2–6.9)
POC LYMPH %: 13.4 % (ref 10–50)
POC MID %: 3.8 % (ref 0–12)
RBC: 4.17 M/uL (ref 4.04–5.48)
RDW, POC: 13.8 %
WBC: 4.7 10*3/uL (ref 4.6–10.2)

## 2013-07-09 LAB — LIPASE, BLOOD: Lipase: 16 U/L (ref 11–59)

## 2013-07-09 LAB — POC URINE PREG, ED: PREG TEST UR: NEGATIVE

## 2013-07-09 MED ORDER — ONDANSETRON 4 MG PO TBDP
4.0000 mg | ORAL_TABLET | Freq: Once | ORAL | Status: AC
  Administered 2013-07-09: 4 mg via ORAL

## 2013-07-09 MED ORDER — HYDROMORPHONE HCL PF 1 MG/ML IJ SOLN
1.0000 mg | Freq: Once | INTRAMUSCULAR | Status: AC
  Administered 2013-07-09: 1 mg via INTRAVENOUS
  Filled 2013-07-09: qty 1

## 2013-07-09 MED ORDER — ONDANSETRON HCL 4 MG PO TABS: 4.0000 mg | ORAL_TABLET | Freq: Four times a day (QID) | ORAL | Status: DC

## 2013-07-09 MED ORDER — ONDANSETRON HCL 4 MG/2ML IJ SOLN
4.0000 mg | Freq: Once | INTRAMUSCULAR | Status: AC
  Administered 2013-07-09: 4 mg via INTRAVENOUS
  Filled 2013-07-09: qty 2

## 2013-07-09 MED ORDER — OXYCODONE-ACETAMINOPHEN 5-325 MG PO TABS: 1.0000 | ORAL_TABLET | Freq: Four times a day (QID) | ORAL | Status: DC | PRN

## 2013-07-09 NOTE — ED Notes (Signed)
Per EMS: pt from urgent care, c/o n/v/d started last night. Pt has slight fever 100.8 tympanic. Pt has 22 in right hand 8 mg zofran, nausea is better. 800 ml NS.

## 2013-07-09 NOTE — ED Provider Notes (Signed)
CSN: [id number]     Arrival date & time 07/09/13  1342 History   First MD Initiated Contact with Patient 07/09/13 1504     Chief Complaint  Patient presents with  . Nausea  . Emesis  . Diarrhea     (Consider location/radiation/quality/duration/timing/severity/associated sxs/prior Treatment) HPI Comments: Patient is a 38 year old female with history of viral meningitis, anemia, depression, headaches, arthritis who presents today with myalgias, arthralgias, nausea, vomiting, abdominal pain, and diarrhea. She reports all her symptoms began yesterday and have waxed and waned. Her skin is a burning sensation and is sensitive to touch. She has a sharp pain in her abdomen diffusely. Her headache has been gradually worsening and is worse in the front of her head. She has associated pain with EOMs and photophobia. She has been febrile. Last took tylenol this morning. She denies any sick contacts or suspicious food intake, but a family member reports they were [place] a [place] where he found a tick on him.  The history is provided by the patient. No language interpreter was used.    Past Medical History  Diagnosis Date  . Anxiety   . Meningitis, viral At age 4 and at age 79  . Other and unspecified ovarian cysts 2011- pelvic CT    GYN- Dr. Chevis Pretty  . Anemia   . Depression   . Chronic headache disorder     s/p Meningitis x 2, tension- type plus allergic/sinus problems  . History of abnormal Pap smear   . Nasal fracture 2011  . Arthritis    Past Surgical History  Procedure Laterality Date  . Foot surgery  s/p 2 procedures  . Laparoscopy for ectopic pregnancy  1998    Fallopian tube removed  . Uterine artery embolization     Family History  Problem Relation Age of Onset  . Migraines Father     cluster headache  . Cancer Mother   . Diabetes      paternal relaitve  . Seizures      maternal relative  . Rheum arthritis      maternal relatives  . Stroke Maternal Grandmother   . Diabetes  Paternal Grandmother    History  Substance Use Topics  . Smoking status: Former Games developer  . Smokeless tobacco: Not on file  . Alcohol Use: Yes   OB History   Grav Para Term Preterm Abortions TAB SAB Ect Mult Living                 Review of Systems  Constitutional: Positive for fever and chills.  Eyes: Positive for photophobia and pain.  Respiratory: Negative for shortness of breath.   Cardiovascular: Negative for chest pain.  Gastrointestinal: Positive for nausea, vomiting, abdominal pain and diarrhea.  Musculoskeletal: Positive for arthralgias, myalgias and neck pain.  Skin: Negative for rash.  Neurological: Positive for headaches.  All other systems reviewed and are negative.     Allergies  Review of patient's allergies indicates no known allergies.  Home Medications   Current Outpatient Rx  Name  Route  Sig  Dispense  Refill  . etonogestrel (NEXPLANON) 68 MG IMPL implant   Subcutaneous   Inject 1 each into the skin once.         . valACYclovir (VALTREX) 500 MG tablet   Oral   Take 500 mg by mouth 2 (two) times daily.         . citalopram (CELEXA) 20 MG tablet   Oral   Take  1 tablet (20 mg total) by mouth daily. PATIENT NEEDS MED REFILL CHECK UP FOR ADDITIONAL REFILLS   30 tablet   0   . omeprazole (PRILOSEC) 20 MG capsule   Oral   Take 1 capsule (20 mg total) by mouth daily. PATIENT NEEDS MED REFILL CHECK UP FOR ADDITIONAL REFILLS   30 capsule   0    BP 159/90  Pulse 99  Temp(Src) 98.7 F (37.1 C) (Oral)  Resp 20  SpO2 97% Physical Exam  Nursing note and vitals reviewed. Constitutional: She is oriented to person, place, and time. She appears well-developed and well-nourished. She appears distressed.  Patient rolling around on bed in pain  HENT:  Head: Normocephalic and atraumatic.  Right Ear: Tympanic membrane, external ear and ear canal normal.  Left Ear: Tympanic membrane, external ear and ear canal normal.  Nose: Nose normal.   Mouth/Throat: Uvula is midline and oropharynx is clear and moist.  Eyes: Conjunctivae and EOM are normal. Pupils are equal, round, and reactive to light.  Neck: Normal range of motion. No rigidity.  I do not appreciated any nuchal rigidity or meningeal signs as per urgent care note  Cardiovascular: Regular rhythm, normal heart sounds, intact distal pulses and normal pulses.  Tachycardia present.   Pulmonary/Chest: Effort normal and breath sounds normal. No stridor. No respiratory distress. She has no wheezes. She has no rales.  Abdominal: Soft. She exhibits no distension. There is generalized tenderness. There is no rigidity, no rebound and no guarding.  Musculoskeletal: Normal range of motion.  Neurological: She is alert and oriented to person, place, and time. She has normal strength. Coordination and gait normal.  Finger nose finger normal. Rapid alternating movements normal. Grip strength 5/5 bilaterally.   Skin: Skin is warm and dry. No rash noted. She is not diaphoretic. No erythema.  Psychiatric: She has a normal mood and affect. Her behavior is normal.    ED Course  Procedures (including critical care time) Labs Review Labs Reviewed  COMPREHENSIVE METABOLIC PANEL - Abnormal; Notable for the following:    Sodium 135 (*)    Glucose, Bld 103 (*)    Total Bilirubin 1.5 (*)    All other components within normal limits  CBC WITH DIFFERENTIAL - Abnormal; Notable for the following:    RBC 3.76 (*)    Hemoglobin 11.0 (*)    HCT 32.4 (*)    Neutrophils Relative % 83 (*)    Lymphs Abs 0.5 (*)    All other components within normal limits  URINE CULTURE  LIPASE, BLOOD  URINALYSIS, ROUTINE W REFLEX MICROSCOPIC  CBC WITH DIFFERENTIAL  ROCKY MTN SPOTTED FVR AB, IGM-BLOOD  POC URINE PREG, ED   Imaging Review No results found.   EKG Interpretation None      MDM   Final diagnoses:  Generalized muscle ache  Nausea vomiting and diarrhea   Patient presents to ED with  generalized pain, nausea, vomiting, and diarrhea since yesterday. N/v/d have seemed to improve. Pain is better after IVF and pain medications. Labs are unremarkable. RMSF antigen was sent given recent tick exposure, but my clinical suspicion for RMSF is low and I do not feel patient needs to be started on treatment currently. Additionally the patient has no nuchal rigidity or meningeal signs that I can appreciate on exam as [facility]. I do not feel the patient has or is developing a meningitis. Dr. Rubin PayorPickering evaluated patient and agrees with plan. Return instructions given. Vital signs stable  for discharge. Patient / Family / Caregiver informed of clinical course, understand medical decision-making process, and agree with plan.    Mora Bellman, PA-C 07/10/13 1152

## 2013-07-09 NOTE — Progress Notes (Signed)
  This chart was scribed for Angela Farrell by Tana ConchStephen Methvin, ED Scribe. This patient was seen in room 1 and the patient's care was started at 11:50 AM .  Subjective:    Patient ID: Angela CairoSheila R Farrell, female    DOB: 1976-02-14, 38 y.o.   MRN: [id number]  HPI  HPI Comments: Angela CairoSheila R Farrell is a 38 y.o. female who [facility] with constant n/v/d, "it comes in waves" and began last night at around 11:30 PM. She reports going to lunch yesterday and feeling really "bloated" after eating a [place] burger. Pt reports associated cramping and neck pain. Pt reports 4 episodes of vomiting and around 12 episodes of diarrhea since last night. She reports her last episode of vomiting occurred before dawn". Pt reports chills. Pt denies hematochezia or melena. Pt reports that "this does not feel like meningitis", her neck pain is due to vomitting. Pt has not been around anyone with similar symptoms.    Pt has NKA  Pt reports several episodes of meningitis      Review of Systems  Constitutional: Positive for chills.  Gastrointestinal: Positive for nausea, vomiting, abdominal pain and diarrhea. Negative for blood in stool.  Musculoskeletal: Positive for neck pain and neck stiffness.       Objective:   Physical Exam  Constitutional: She is oriented to person, place, and time. She appears ill.  Neck: Rigidity present.  Abdominal: There is tenderness.  Tenderness right upper abdomen and mid abdomen  Musculoskeletal:  nucal rigidity   Neurological: She is alert and oriented to person, place, and time.          Assessment & Plan:   Filed Vitals:   07/09/13 1136  BP: 132/68  Pulse: 119  Temp:   Resp:     Results for orders placed in visit on 07/09/13  POCT CBC      Result Value Ref Range   WBC 4.7  4.6 - 10.2 K/uL   Lymph, poc 0.6  0.6 - 3.4   POC LYMPH PERCENT 13.4  10 - 50 %L   MID (cbc) 0.2  0 - 0.9   POC MID % 3.8  0 - 12 %M   POC Granulocyte 3.9  2 - 6.9   Granulocyte  percent 82.8 (*) 37 - 80 %G   RBC 4.17  4.04 - 5.48 M/uL   Hemoglobin 11.9 (*) 12.2 - 16.2 g/dL   HCT, POC 40.932.7 (*) 81.137.7 - 47.9 %   MCV 91.4  80 - 97 fL   MCH, POC 28.5  27 - 31.2 pg   MCHC 31.2 (*) 31.8 - 35.4 g/dL   RDW, POC 91.413.8     Platelet Count, POC 304  142 - 424 K/uL   MPV 9.0  0 - 99.8 fL       11:59 AM-Discussed treatment plan which includes labs with pt at bedside and pt agreed to plan.  Patient continues to have episodes of nausea associated with crampy abdominal pain. Patient sent to the ER for further evaluation I personally performed the services described in this documentation, which was scribed in my presence. The recorded information has been reviewed and is accurate.

## 2013-07-09 NOTE — ED Notes (Signed)
Bed: WA07 Expected date:  Expected time:  Means of arrival:  Comments: EMS 

## 2013-07-09 NOTE — Discharge Instructions (Signed)
Viral Infections °A virus is a type of germ. Viruses can cause: °· Minor sore throats. °· Aches and pains. °· Headaches. °· Runny nose. °· Rashes. °· Watery eyes. °· Tiredness. °· Coughs. °· Loss of appetite. °· Feeling sick to your stomach (nausea). °· Throwing up (vomiting). °· Watery poop (diarrhea). °HOME CARE  °· Only take medicines as told by your doctor. °· Drink enough water and fluids to keep your pee (urine) clear or pale yellow. Sports drinks are a good choice. °· Get plenty of rest and eat healthy. Soups and broths with crackers or rice are fine. °GET HELP RIGHT AWAY IF:  °· You have a very bad headache. °· You have shortness of breath. °· You have chest pain or neck pain. °· You have an unusual rash. °· You cannot stop throwing up. °· You have watery poop that does not stop. °· You cannot keep fluids down. °· You or your child has a temperature by mouth above 102° F (38.9° C), not controlled by medicine. °· Your baby is older than 3 months with a rectal temperature of 102° F (38.9° C) or higher. °· Your baby is 3 months old or younger with a rectal temperature of 100.4° F (38° C) or higher. °MAKE SURE YOU:  °· Understand these instructions. °· Will watch this condition. °· Will get help right away if you are not doing well or get worse. °Document Released: 03/02/2008 Document Revised: 06/12/2011 Document Reviewed: 07/26/2010 °[employer]® Patient Information ©2014 [employer]. ° °

## 2013-07-10 LAB — URINE CULTURE
Colony Count: NO GROWTH
Culture: NO GROWTH

## 2013-07-10 LAB — BILIRUBIN, FRACTIONATED(TOT/DIR/INDIR)
BILIRUBIN DIRECT: 0.3 mg/dL (ref 0.0–0.3)
BILIRUBIN TOTAL: 1.7 mg/dL — AB (ref 0.2–1.2)
Indirect Bilirubin: 1.4 mg/dL — ABNORMAL HIGH (ref 0.2–1.2)

## 2013-07-11 LAB — ROCKY MTN SPOTTED FVR AB, IGM-BLOOD: RMSF IGM: 0.38 IV (ref 0.00–0.89)

## 2013-07-14 NOTE — ED Provider Notes (Signed)
Medical screening examination/treatment/procedure(s) were conducted as a shared visit with non-physician practitioner(s) and myself.  I personally evaluated the patient during the encounter.   EKG Interpretation None     Patient with headache, nausea vomiting diarrhea. History of viral meningitis, but no meningeal signs. Has recent for the fever no nausea vomiting diarrhea. Feels somewhat better after treatment will be discharged home.  Juliet RudeNathan R. Rubin PayorPickering, MD 07/14/13 [id number]

## 2013-08-01 ENCOUNTER — Ambulatory Visit (INDEPENDENT_AMBULATORY_CARE_PROVIDER_SITE_OTHER): Payer: 59 | Admitting: Family Medicine

## 2013-08-01 VITALS — BP 126/70 | HR 97 | Temp 98.4°F | Resp 18 | Ht 64.5 in | Wt 221.0 lb

## 2013-08-01 DIAGNOSIS — J069 Acute upper respiratory infection, unspecified: Secondary | ICD-10-CM

## 2013-08-01 DIAGNOSIS — J029 Acute pharyngitis, unspecified: Secondary | ICD-10-CM

## 2013-08-01 MED ORDER — MAGIC MOUTHWASH W/LIDOCAINE: 5.0000 mL | Freq: Four times a day (QID) | ORAL | Status: DC | PRN

## 2013-08-01 NOTE — Progress Notes (Addendum)
Subjective:    [name], female    DOB: April 22, 1975, 38 y.o.   MRN: [id number]  Cough Associated symptoms include ear pain, rhinorrhea and a sore throat. Pertinent negatives include no chest pain, chills, fever, rash, shortness of breath or wheezing.  Sore Throat  Associated symptoms include congestion, coughing, ear pain and trouble swallowing. Pertinent negatives include no diarrhea, shortness of breath or vomiting.  Otalgia  Associated symptoms include coughing, rhinorrhea and a sore throat. Pertinent negatives include no diarrhea, rash or vomiting.   Chief Complaint  Patient presents with  . Cough    x2 days yellowish mucous  . Sore Throat  . Otalgia   This chart was scribed for Trinna PostJeffery Anahlia Iseminger, MD by Andrew Auaven Small, ED Scribe. This patient was seen in room 8 and the patient's care was started at 4:57 PM.  HPI Comments: Angela Farrell is a 38 y.o. female who presents to the [facility] complaining of constant worsening sore throat onset 2 days with associated painful productive cough consisting of yellow sputum, nasal congestion, rhinorrhea, trouble swallowing and bilateral ear pain behind the ears. She reports symptoms began with sore dry throat which progressed to cough, ear pain and congestion within 2 days. She reports taking tylenol cold, alka seltzer plus, cough drops and throat spray without relief to symptoms. Pt denies fever, chills, CP,  and SOB. Pt denies rash.  Pt reports sick contact of sick 40 y.o daughter who had cold symptoms 1 week ago. She reports daughter was not taken to the doctor. She reports being able to keep fluid in system.   Past Medical History  Diagnosis Date  . Anxiety   . Meningitis, viral At age 38 and at age 38  . Other and unspecified ovarian cysts 2011- pelvic CT    GYN- Dr. Chevis PrettyMezer  . Anemia   . Depression   . Chronic headache disorder     s/p Meningitis x 2, tension- type plus allergic/sinus problems  .  History of abnormal Pap smear   . Nasal fracture 2011  . Arthritis    No Known Allergies Prior to Admission medications   Medication Sig Start Date End Date Taking? Authorizing Provider  citalopram (CELEXA) 20 MG tablet Take 1 tablet (20 mg total) by mouth daily. PATIENT NEEDS MED REFILL CHECK UP FOR ADDITIONAL REFILLS   Yes Heather M Marte, PA-C  etonogestrel (NEXPLANON) 68 MG IMPL implant Inject 1 each into the skin once.   Yes Historical Provider, MD  omeprazole (PRILOSEC) 20 MG capsule Take 1 capsule (20 mg total) by mouth daily. PATIENT NEEDS MED REFILL CHECK UP FOR ADDITIONAL REFILLS   Yes Heather M Marte, PA-C  ondansetron (ZOFRAN) 4 MG tablet Take 1 tablet (4 mg total) by mouth every 6 (six) hours. 07/09/13   Mora BellmanHannah S Merrell, PA-C  oxyCODONE-acetaminophen (PERCOCET/ROXICET) 5-325 MG per tablet Take 1-2 tablets by mouth every 6 (six) hours as needed for severe pain. 07/09/13   Mora BellmanHannah S Merrell, PA-C  valACYclovir (VALTREX) 500 MG tablet Take 500 mg by mouth 2 (two) times daily.    Historical Provider, MD   Review of Systems  Constitutional: Negative for fever, chills and appetite change.  HENT: Positive for congestion, ear pain, rhinorrhea, sore throat and trouble swallowing.   Respiratory: Positive for cough. Negative for chest tightness, shortness of breath and wheezing.   Cardiovascular: Negative for chest pain.  Gastrointestinal: Negative for nausea, vomiting and diarrhea.  Skin: Negative for  rash.      Objective:   Physical Exam  Vitals reviewed. Constitutional: She is oriented to person, place, and time. She appears well-developed and well-nourished. No distress.  HENT:  Head: Normocephalic and atraumatic.  Right Ear: Hearing, tympanic membrane, external ear and ear canal normal.  Left Ear: Hearing, tympanic membrane, external ear and ear canal normal.  Nose: Nose normal.  Mouth/Throat: Oropharynx is clear and moist. No oropharyngeal exudate, posterior oropharyngeal edema,  posterior oropharyngeal erythema or tonsillar abscesses.  Eyes: Conjunctivae and EOM are normal. Pupils are equal, round, and reactive to light.  Neck: Neck supple.  Cardiovascular: Normal rate, regular rhythm, normal heart sounds and intact distal pulses.   No murmur heard. Pulmonary/Chest: Effort normal and breath sounds normal. No respiratory distress. She has no wheezes. She has no rhonchi.  Lymphadenopathy:    She has no cervical adenopathy.  Neurological: She is alert and oriented to person, place, and time.  Skin: Skin is warm and dry. No rash noted.  Psychiatric: She has a normal mood and affect. Her behavior is normal.      Assessment & Plan:    1. Acute upper respiratory infections of unspecified site   2. Sore throat    Sx care discussed for likely viral syndrome. Deferred strep testing based on [facility] criteria, but if any worsening of sx's, or fever - rtc. Sample - 15ml of Delsym given, magic mouthwash discussed. Other sx care discussed and rtc precautions.    Meds ordered this encounter  Medications  . Alum & Mag Hydroxide-Simeth (MAGIC MOUTHWASH W/LIDOCAINE) SOLN    Sig: Take 5 mLs by mouth 4 (four) times daily as needed for mouth pain.    Dispense:  120 mL    Refill:  0    Ok to substitute ingredients per pharmacy usual "magic mouthwash" prep.   Patient Instructions  Saline nasal spray if needed, throat lozenges and fluids as discussed. Magic mouthwash if needed and Delsym OR mucinex DM over the counter.  Return to the clinic or go to the nearest emergency room if any of your symptoms worsen or new symptoms occur.  Upper Respiratory Infection, Adult An upper respiratory infection (URI) is also sometimes known as the common cold. The upper respiratory tract includes the nose, sinuses, throat, trachea, and bronchi. Bronchi are the airways leading to the lungs. Most people improve within 1 week, but symptoms can last up to 2 weeks. A residual cough may last even longer.    CAUSES Many different viruses can infect the tissues lining the upper respiratory tract. The tissues become irritated and inflamed and often become very moist. Mucus production is also common. A cold is contagious. You can easily spread the virus to others by oral contact. This includes kissing, sharing a glass, coughing, or sneezing. Touching your mouth or nose and then touching a surface, which is then touched by another person, can also spread the virus. SYMPTOMS  Symptoms typically develop 1 to 3 days after you come in contact with a cold virus. Symptoms vary from person to person. They may include:  Runny nose.  Sneezing.  Nasal congestion.  Sinus irritation.  Sore throat.  Loss of voice (laryngitis).  Cough.  Fatigue.  Muscle aches.  Loss of appetite.  Headache.  Low-grade fever. DIAGNOSIS  You might diagnose your own cold based on familiar symptoms, since most people get a cold 2 to 3 times a year. Your caregiver can confirm this based on your exam. Most importantly, your  caregiver can check that your symptoms are not due to another disease such as strep throat, sinusitis, pneumonia, asthma, or epiglottitis. Blood tests, throat tests, and X-rays are not necessary to diagnose a common cold, but they may sometimes be helpful in excluding other more serious diseases. Your caregiver will decide if any further tests are required. RISKS AND COMPLICATIONS  You may be at risk for a more severe case of the common cold if you smoke cigarettes, have chronic heart disease (such as heart failure) or lung disease (such as asthma), or if you have a weakened immune system. The very young and very old are also at risk for more serious infections. Bacterial sinusitis, middle ear infections, and bacterial pneumonia can complicate the common cold. The common cold can worsen asthma and chronic obstructive pulmonary disease (COPD). Sometimes, these complications can require emergency medical care  and may be life-threatening. PREVENTION  The best way to protect against getting a cold is to practice good hygiene. Avoid oral or hand contact with people with cold symptoms. Wash your hands often if contact occurs. There is no clear evidence that vitamin C, vitamin E, echinacea, or exercise reduces the chance of developing a cold. However, it is always recommended to get plenty of rest and practice good nutrition. TREATMENT  Treatment is directed at relieving symptoms. There is no cure. Antibiotics are not effective, because the infection is caused by a virus, not by bacteria. Treatment may include:  Increased fluid intake. Sports drinks offer valuable electrolytes, sugars, and fluids.  Breathing heated mist or steam (vaporizer or shower).  Eating chicken soup or other clear broths, and maintaining good nutrition.  Getting plenty of rest.  Using gargles or lozenges for comfort.  Controlling fevers with ibuprofen or acetaminophen as directed by your caregiver.  Increasing usage of your inhaler if you have asthma. Zinc gel and zinc lozenges, taken in the first 24 hours of the common cold, can shorten the duration and lessen the severity of symptoms. Pain medicines may help with fever, muscle aches, and throat pain. A variety of non-prescription medicines are available to treat congestion and runny nose. Your caregiver can make recommendations and may suggest nasal or lung inhalers for other symptoms.  HOME CARE INSTRUCTIONS   Only take over-the-counter or prescription medicines for pain, discomfort, or fever as directed by your caregiver.  Use a warm mist humidifier or inhale steam from a shower to increase air moisture. This may keep secretions moist and make it easier to breathe.  Drink enough water and fluids to keep your urine clear or pale yellow.  Rest as needed.  Return to work when your temperature has returned to normal or as your caregiver advises. You may need to stay home  longer to avoid infecting others. You can also use a face mask and careful hand washing to prevent spread of the virus. SEEK MEDICAL CARE IF:   After the first few days, you feel you are getting worse rather than better.  You need your caregiver's advice about medicines to control symptoms.  You develop chills, worsening shortness of breath, or brown or red sputum. These may be signs of pneumonia.  You develop yellow or brown nasal discharge or pain in the face, especially when you bend forward. These may be signs of sinusitis.  You develop a fever, swollen neck glands, pain with swallowing, or white areas in the back of your throat. These may be signs of strep throat. SEEK IMMEDIATE MEDICAL CARE IF:  You have a fever.  You develop severe or persistent headache, ear pain, sinus pain, or chest pain.  You develop wheezing, a prolonged cough, cough up blood, or have a change in your usual mucus (if you have chronic lung disease).  You develop sore muscles or a stiff neck. Document Released: 09/13/2000 Document Revised: 06/12/2011 Document Reviewed: 07/22/2010 [facility] Patient Information 2014 [place], [place]. Sore Throat A sore throat is pain, burning, irritation, or scratchiness of the throat. There is often pain or tenderness when swallowing or talking. A sore throat may be accompanied by other symptoms, such as coughing, sneezing, fever, and swollen neck glands. A sore throat is often the first sign of another sickness, such as a cold, flu, strep throat, or mononucleosis (commonly known as mono). Most sore throats go away without medical treatment. CAUSES  The most common causes of a sore throat include:  A viral infection, such as a cold, flu, or mono.  A bacterial infection, such as strep throat, tonsillitis, or whooping cough.  Seasonal allergies.  Dryness in the air.  Irritants, such as smoke or pollution.  Gastroesophageal reflux disease (GERD). HOME CARE INSTRUCTIONS    Only take over-the-counter medicines as directed by your caregiver.  Drink enough fluids to keep your urine clear or pale yellow.  Rest as needed.  Try using throat sprays, lozenges, or sucking on hard candy to ease any pain (if older than 4 years or as directed).  Sip warm liquids, such as broth, herbal tea, or warm water with honey to relieve pain temporarily. You may also eat or drink cold or frozen liquids such as frozen ice pops.  Gargle with salt water (mix 1 tsp salt with 8 oz of water).  Do not smoke and avoid secondhand smoke.  Put a cool-mist humidifier in your bedroom at night to moisten the air. You can also turn on a hot shower and sit in the bathroom with the door closed for 5 10 minutes. SEEK IMMEDIATE MEDICAL CARE IF:  You have difficulty breathing.  You are unable to swallow fluids, soft foods, or your saliva.  You have increased swelling in the throat.  Your sore throat does not get better in 7 days.  You have nausea and vomiting.  You have a fever or persistent symptoms for more than 2 3 days.  You have a fever and your symptoms suddenly get worse. MAKE SURE YOU:   Understand these instructions.  Will watch your condition.  Will get help right away if you are not doing well or get worse. Document Released: 04/27/2004 Document Revised: 03/06/2012 Document Reviewed: 11/26/2011 [facility]esExitCare Patient Information 2014 [place], [place].       I personally performed the services described in this documentation, which was scribed in my presence. The recorded information has been reviewed and considered, and addended by me as needed.

## 2013-08-01 NOTE — Patient Instructions (Signed)
Saline nasal spray if needed, throat lozenges and fluids as discussed. Magic mouthwash if needed and Delsym OR mucinex DM over the counter.  Return to the clinic or go to the nearest emergency room if any of your symptoms worsen or new symptoms occur.  Upper Respiratory Infection, Adult An upper respiratory infection (URI) is also sometimes known as the common cold. The upper respiratory tract includes the nose, sinuses, throat, trachea, and bronchi. Bronchi are the airways leading to the lungs. Most people improve within 1 week, but symptoms can last up to 2 weeks. A residual cough may last even longer.  CAUSES Many different viruses can infect the tissues lining the upper respiratory tract. The tissues become irritated and inflamed and often become very moist. Mucus production is also common. A cold is contagious. You can easily spread the virus to others by oral contact. This includes kissing, sharing a glass, coughing, or sneezing. Touching your mouth or nose and then touching a surface, which is then touched by another person, can also spread the virus. SYMPTOMS  Symptoms typically develop 1 to 3 days after you come in contact with a cold virus. Symptoms vary from person to person. They may include:  Runny nose.  Sneezing.  Nasal congestion.  Sinus irritation.  Sore throat.  Loss of voice (laryngitis).  Cough.  Fatigue.  Muscle aches.  Loss of appetite.  Headache.  Low-grade fever. DIAGNOSIS  You might diagnose your own cold based on familiar symptoms, since most people get a cold 2 to 3 times a year. Your caregiver can confirm this based on your exam. Most importantly, your caregiver can check that your symptoms are not due to another disease such as strep throat, sinusitis, pneumonia, asthma, or epiglottitis. Blood tests, throat tests, and X-rays are not necessary to diagnose a common cold, but they may sometimes be helpful in excluding other more serious diseases. Your  caregiver will decide if any further tests are required. RISKS AND COMPLICATIONS  You may be at risk for a more severe case of the common cold if you smoke cigarettes, have chronic heart disease (such as heart failure) or lung disease (such as asthma), or if you have a weakened immune system. The very young and very old are also at risk for more serious infections. Bacterial sinusitis, middle ear infections, and bacterial pneumonia can complicate the common cold. The common cold can worsen asthma and chronic obstructive pulmonary disease (COPD). Sometimes, these complications can require emergency medical care and may be life-threatening. PREVENTION  The best way to protect against getting a cold is to practice good hygiene. Avoid oral or hand contact with people with cold symptoms. Wash your hands often if contact occurs. There is no clear evidence that vitamin C, vitamin E, echinacea, or exercise reduces the chance of developing a cold. However, it is always recommended to get plenty of rest and practice good nutrition. TREATMENT  Treatment is directed at relieving symptoms. There is no cure. Antibiotics are not effective, because the infection is caused by a virus, not by bacteria. Treatment may include:  Increased fluid intake. Sports drinks offer valuable electrolytes, sugars, and fluids.  Breathing heated mist or steam (vaporizer or shower).  Eating chicken soup or other clear broths, and maintaining good nutrition.  Getting plenty of rest.  Using gargles or lozenges for comfort.  Controlling fevers with ibuprofen or acetaminophen as directed by your caregiver.  Increasing usage of your inhaler if you have asthma. Zinc gel and zinc lozenges,  taken in the first 24 hours of the common cold, can shorten the duration and lessen the severity of symptoms. Pain medicines may help with fever, muscle aches, and throat pain. A variety of non-prescription medicines are available to treat congestion  and runny nose. Your caregiver can make recommendations and may suggest nasal or lung inhalers for other symptoms.  HOME CARE INSTRUCTIONS   Only take over-the-counter or prescription medicines for pain, discomfort, or fever as directed by your caregiver.  Use a warm mist humidifier or inhale steam from a shower to increase air moisture. This may keep secretions moist and make it easier to breathe.  Drink enough water and fluids to keep your urine clear or pale yellow.  Rest as needed.  Return to work when your temperature has returned to normal or as your caregiver advises. You may need to stay home longer to avoid infecting others. You can also use a face mask and careful hand washing to prevent spread of the virus. SEEK MEDICAL CARE IF:   After the first few days, you feel you are getting worse rather than better.  You need your caregiver's advice about medicines to control symptoms.  You develop chills, worsening shortness of breath, or brown or red sputum. These may be signs of pneumonia.  You develop yellow or brown nasal discharge or pain in the face, especially when you bend forward. These may be signs of sinusitis.  You develop a fever, swollen neck glands, pain with swallowing, or white areas in the back of your throat. These may be signs of strep throat. SEEK IMMEDIATE MEDICAL CARE IF:   You have a fever.  You develop severe or persistent headache, ear pain, sinus pain, or chest pain.  You develop wheezing, a prolonged cough, cough up blood, or have a change in your usual mucus (if you have chronic lung disease).  You develop sore muscles or a stiff neck. Document Released: 09/13/2000 Document Revised: 06/12/2011 Document Reviewed: 07/22/2010 [facility]Exit[facility] Patient Information 2014 [place]Care, [place]. Sore Throat A sore throat is pain, burning, irritation, or scratchiness of the throat. There is often pain or tenderness when swallowing or talking. A sore throat may be accompanied  by other symptoms, such as coughing, sneezing, fever, and swollen neck glands. A sore throat is often the first sign of another sickness, such as a cold, flu, strep throat, or mononucleosis (commonly known as mono). Most sore throats go away without medical treatment. CAUSES  The most common causes of a sore throat include:  A viral infection, such as a cold, flu, or mono.  A bacterial infection, such as strep throat, tonsillitis, or whooping cough.  Seasonal allergies.  Dryness in the air.  Irritants, such as smoke or pollution.  Gastroesophageal reflux disease (GERD). HOME CARE INSTRUCTIONS   Only take over-the-counter medicines as directed by your caregiver.  Drink enough fluids to keep your urine clear or pale yellow.  Rest as needed.  Try using throat sprays, lozenges, or sucking on hard candy to ease any pain (if older than 4 years or as directed).  Sip warm liquids, such as broth, herbal tea, or warm water with honey to relieve pain temporarily. You may also eat or drink cold or frozen liquids such as frozen ice pops.  Gargle with salt water (mix 1 tsp salt with 8 oz of water).  Do not smoke and avoid secondhand smoke.  Put a cool-mist humidifier in your bedroom at night to moisten the air. You can also turn  on a hot shower and sit in the bathroom with the door closed for 5 10 minutes. SEEK IMMEDIATE MEDICAL CARE IF:  You have difficulty breathing.  You are unable to swallow fluids, soft foods, or your saliva.  You have increased swelling in the throat.  Your sore throat does not get better in 7 days.  You have nausea and vomiting.  You have a fever or persistent symptoms for more than 2 3 days.  You have a fever and your symptoms suddenly get worse. MAKE SURE YOU:   Understand these instructions.  Will watch your condition.  Will get help right away if you are not doing well or get worse. Document Released: 04/27/2004 Document Revised: 03/06/2012 Document  Reviewed: 11/26/2011 [facility] Patient Information 2014 [place], [place].

## 2013-08-11 ENCOUNTER — Other Ambulatory Visit: Payer: Self-pay | Admitting: Physician Assistant

## 2013-09-09 ENCOUNTER — Other Ambulatory Visit: Payer: Self-pay | Admitting: Family Medicine

## 2013-09-17 ENCOUNTER — Other Ambulatory Visit: Payer: Self-pay | Admitting: Physician Assistant

## 2013-10-27 ENCOUNTER — Ambulatory Visit (INDEPENDENT_AMBULATORY_CARE_PROVIDER_SITE_OTHER): Payer: 59 | Admitting: Family Medicine

## 2013-10-27 VITALS — BP 136/90 | HR 92 | Temp 98.1°F | Resp 18 | Ht 64.5 in | Wt 219.0 lb

## 2013-10-27 DIAGNOSIS — R109 Unspecified abdominal pain: Secondary | ICD-10-CM

## 2013-10-27 DIAGNOSIS — M546 Pain in thoracic spine: Secondary | ICD-10-CM

## 2013-10-27 MED ORDER — HYDROCODONE-ACETAMINOPHEN 5-325 MG PO TABS: 1.0000 | ORAL_TABLET | Freq: Four times a day (QID) | ORAL | Status: DC | PRN

## 2013-10-27 NOTE — Patient Instructions (Signed)

## 2013-10-27 NOTE — Progress Notes (Signed)
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 She's had the same pain in the past. Comes on spontaneously after doing even mild work. She's not had this in quite some time.  Yesterday she was watering her lawn when it began. She describes the pain as sharp and radiating along the lower rib from her back around the left upper quadrant. It is not associated with any skin tenderness or rash.  Objective: No acute distress HEENT: Unremarkable Neck: Supple no adenopathy Chest: Clear, mildly tender along the left 11th rib  Heart: Regular no murmur Abdomen: Soft nontender  Left flank pain - Plan: HYDROcodone-acetaminophen (NORCO) 5-325 MG per tablet  Left-sided thoracic back pain - Plan: HYDROcodone-acetaminophen (NORCO) 5-325 MG per tablet  Signed, Elvina SidleKurt Jenisis Harmsen, MD

## 2013-11-04 ENCOUNTER — Telehealth: Payer: Self-pay

## 2013-11-04 NOTE — Telephone Encounter (Signed)
Pt saw Dr. Elbert EwingsL 10/27/13 for back pain and spasms. She was prescribed pain medicine but wants to know if she can be prescribed something specifically for the spasms.

## 2013-11-05 MED ORDER — METHOCARBAMOL 500 MG PO TABS: 500.0000 mg | ORAL_TABLET | Freq: Four times a day (QID) | ORAL | Status: DC | PRN

## 2013-11-05 NOTE — Telephone Encounter (Signed)
Pt would like to try Robaxin. Can we call in a script to her pharmacy for her? Flexeril did not work for her in the past.

## 2013-11-05 NOTE — Telephone Encounter (Signed)
rx for robaxin sent to pharmacy

## 2013-11-05 NOTE — Telephone Encounter (Signed)
Pt advised.

## 2013-11-08 ENCOUNTER — Other Ambulatory Visit: Payer: Self-pay | Admitting: Family Medicine

## 2013-11-17 ENCOUNTER — Other Ambulatory Visit: Payer: Self-pay | Admitting: Physician Assistant

## 2013-11-18 NOTE — Telephone Encounter (Signed)
Dr L, see notes from 11/04/13. Do you want to give pt RFs? Is quantity of #60 ok or do you want #120 to cover max dose?

## 2013-11-28 ENCOUNTER — Telehealth: Payer: Self-pay

## 2013-11-28 ENCOUNTER — Encounter: Payer: Self-pay | Admitting: Family Medicine

## 2013-11-28 ENCOUNTER — Ambulatory Visit (INDEPENDENT_AMBULATORY_CARE_PROVIDER_SITE_OTHER): Payer: 59 | Admitting: Family Medicine

## 2013-11-28 VITALS — BP 130/86 | HR 92 | Temp 98.6°F | Resp 16 | Ht 64.0 in | Wt 219.6 lb

## 2013-11-28 DIAGNOSIS — F411 Generalized anxiety disorder: Secondary | ICD-10-CM

## 2013-11-28 DIAGNOSIS — M549 Dorsalgia, unspecified: Secondary | ICD-10-CM

## 2013-11-28 DIAGNOSIS — Z0000 Encounter for general adult medical examination without abnormal findings: Secondary | ICD-10-CM

## 2013-11-28 DIAGNOSIS — F413 Other mixed anxiety disorders: Secondary | ICD-10-CM

## 2013-11-28 DIAGNOSIS — R51 Headache: Secondary | ICD-10-CM

## 2013-11-28 DIAGNOSIS — R519 Headache, unspecified: Secondary | ICD-10-CM

## 2013-11-28 DIAGNOSIS — M5489 Other dorsalgia: Secondary | ICD-10-CM

## 2013-11-28 LAB — CBC WITH DIFFERENTIAL/PLATELET
Basophils Absolute: 0 10*3/uL (ref 0.0–0.1)
Basophils Relative: 0 % (ref 0–1)
Eosinophils Absolute: 0.1 10*3/uL (ref 0.0–0.7)
Eosinophils Relative: 1 % (ref 0–5)
HEMATOCRIT: 34.7 % — AB (ref 36.0–46.0)
HEMOGLOBIN: 11.6 g/dL — AB (ref 12.0–15.0)
LYMPHS ABS: 2.5 10*3/uL (ref 0.7–4.0)
LYMPHS PCT: 41 % (ref 12–46)
MCH: 27.9 pg (ref 26.0–34.0)
MCHC: 33.4 g/dL (ref 30.0–36.0)
MCV: 83.4 fL (ref 78.0–100.0)
MONO ABS: 0.5 10*3/uL (ref 0.1–1.0)
MONOS PCT: 8 % (ref 3–12)
NEUTROS PCT: 50 % (ref 43–77)
Neutro Abs: 3.1 10*3/uL (ref 1.7–7.7)
Platelets: 372 10*3/uL (ref 150–400)
RBC: 4.16 MIL/uL (ref 3.87–5.11)
RDW: 13.7 % (ref 11.5–15.5)
WBC: 6.1 10*3/uL (ref 4.0–10.5)

## 2013-11-28 LAB — COMPLETE METABOLIC PANEL WITH GFR
ALK PHOS: 55 U/L (ref 39–117)
ALT: 28 U/L (ref 0–35)
AST: 24 U/L (ref 0–37)
Albumin: 4.5 g/dL (ref 3.5–5.2)
BILIRUBIN TOTAL: 1 mg/dL (ref 0.2–1.2)
BUN: 11 mg/dL (ref 6–23)
CO2: 24 mEq/L (ref 19–32)
CREATININE: 0.67 mg/dL (ref 0.50–1.10)
Calcium: 9.4 mg/dL (ref 8.4–10.5)
Chloride: 105 mEq/L (ref 96–112)
GLUCOSE: 91 mg/dL (ref 70–99)
Potassium: 4.4 mEq/L (ref 3.5–5.3)
SODIUM: 137 meq/L (ref 135–145)
TOTAL PROTEIN: 7.5 g/dL (ref 6.0–8.3)

## 2013-11-28 LAB — LIPID PANEL
Cholesterol: 218 mg/dL — ABNORMAL HIGH (ref 0–200)
HDL: 51 mg/dL (ref >39)
LDL CALC: 145 mg/dL — AB (ref 0–99)
TRIGLYCERIDES: 112 mg/dL (ref <150)
Total CHOL/HDL Ratio: 4.3 Ratio
VLDL: 22 mg/dL (ref 0–40)

## 2013-11-28 MED ORDER — VALACYCLOVIR HCL 500 MG PO TABS: 500.0000 mg | ORAL_TABLET | Freq: Two times a day (BID) | ORAL | Status: DC

## 2013-11-28 MED ORDER — HYDROCODONE-ACETAMINOPHEN 10-325 MG PO TABS: ORAL_TABLET | ORAL | Status: DC

## 2013-11-28 MED ORDER — CITALOPRAM HYDROBROMIDE 20 MG PO TABS: 20.0000 mg | ORAL_TABLET | Freq: Every day | ORAL | Status: DC

## 2013-11-28 NOTE — Progress Notes (Signed)
Subjective:    [name], female    DOB: 03-01-1976, 38 y.o.   MRN: [id number]  HPI  This 38 y.o. AA female is here for CPE; she has a chronic HA disorder as a result of recurrent bouts of HSV meningitis. Daily Valtrex and prn hydrocodone-APAP control HAs.  She also has myalgias and chronic back discomfort; muscle relaxant is effective. Pt has depression and anxiety w/ increased symptoms since she ran out of Citalopram > 2 months ago.  HCM: PAP/ MMG- per Dr. Corwin Levins office.           IMM- Current.   Patient Active Problem List   Diagnosis Date Noted  . Chronic headache disorder 04/18/2011    Priority: Medium  . Anxiety disorder 01/18/2012  . Chronic anemia 10/14/2011  . Viral meningitis 09/26/2011  . Obesity 04/18/2011  . Allergic rhinitis 04/18/2011    Class: Chronic    Prior to Admission medications   Medication Sig Start Date End Date Taking? Authorizing Provider  citalopram (CELEXA) 20 MG tablet Take 1 tablet (20 mg total) by mouth daily.   Yes Maurice March, MD  etonogestrel (NEXPLANON) 68 MG IMPL implant Inject 1 each into the skin once.   Yes Historical Provider, MD  methocarbamol (ROBAXIN) 500 MG tablet TAKE 1 TABLET BY MOUTH 4 (FOUR) TIMES DAILY AS NEEDED FOR MUSCLE SPASMS. 11/19/13  Yes Elvina Sidle, MD  omeprazole (PRILOSEC) 20 MG capsule TAKE ONE CAPSULE BY MOUTH EVERY DAY 11/08/13  Yes Heather M Marte, PA-C  valACYclovir (VALTREX) 500 MG tablet Take 1 tablet (500 mg total) by mouth 2 (two) times daily.   Yes Maurice March, MD    History   Social History  . Marital Status: Single    Spouse Name: N/A    Number of Children: N/A  . Years of Education: N/A   Occupational History  . GIS Analyst    Social History Main Topics  . Smoking status: Current Some Day Smoker  . Smokeless tobacco: Not on file  . Alcohol Use: Yes  . Drug Use: No  . Sexual Activity: Not on file   Other Topics Concern  . Not on file   Social History  Narrative  . No narrative on file    Family History  Problem Relation Age of Onset  . Migraines Father     cluster headache  . Hyperlipidemia Father   . Hypertension Father   . Cancer Mother   . Diabetes      paternal relaitve  . Seizures      maternal relative  . Rheum arthritis      maternal relatives  . Stroke Maternal Grandmother   . Diabetes Paternal Grandmother      Review of Systems  Constitutional: Positive for diaphoresis.  HENT: Positive for postnasal drip, rhinorrhea and sinus pressure.   Eyes: Positive for photophobia and redness.  Respiratory: Negative.   Cardiovascular: Negative.   Gastrointestinal: Negative.   Endocrine: Negative.   Genitourinary: Negative.   Musculoskeletal: Positive for arthralgias, back pain, joint swelling, myalgias, neck pain and neck stiffness.  Skin: Negative.   Allergic/Immunologic: Positive for environmental allergies.  Neurological: Positive for headaches.  Hematological: Negative.   Psychiatric/Behavioral: Positive for sleep disturbance and dysphoric mood. Negative for suicidal ideas, confusion, self-injury and agitation. The patient is nervous/anxious.        Depression and anxiety date back to childhood. Counseling tried but pt did not feel a connection w/ counselor.  Objective:   Physical Exam  Nursing note and vitals reviewed. Constitutional: She is oriented to person, place, and time. Vital signs are normal. She appears well-developed and well-nourished. No distress.  HENT:  Head: Normocephalic and atraumatic.  Right Ear: Hearing, tympanic membrane, external ear and ear canal normal.  Left Ear: Hearing, tympanic membrane, external ear and ear canal normal.  Nose: Nose normal. No mucosal edema, nasal deformity or septal deviation.  Mouth/Throat: Uvula is midline, oropharynx is clear and moist and mucous membranes are normal. No oral lesions. Normal dentition. No dental caries.  Eyes: EOM and lids are normal. Pupils  are equal, round, and reactive to light. Right conjunctiva is injected. Right conjunctiva has no hemorrhage. Left conjunctiva is injected. Left conjunctiva has no hemorrhage. No scleral icterus.  Fundoscopic exam:      The right eye shows no papilledema. The right eye shows red reflex.       The left eye shows no papilledema. The left eye shows red reflex.  Neck: Trachea normal, normal range of motion, full passive range of motion without pain and phonation normal. Neck supple. No spinous process tenderness and no muscular tenderness present. No mass and no thyromegaly present.  Cardiovascular: Normal rate, regular rhythm, S1 normal, S2 normal, normal heart sounds and normal pulses.   No extrasystoles are present. PMI is not displaced.  Exam reveals no gallop and no friction rub.   No murmur heard. Pulmonary/Chest: Effort normal and breath sounds normal. No respiratory distress. She has no decreased breath sounds. She has no wheezes.  Breast exam deferred.  Abdominal: Soft. Normal appearance and bowel sounds are normal. She exhibits no distension, no pulsatile midline mass and no mass. There is no hepatosplenomegaly. There is no tenderness. There is no guarding and no CVA tenderness.  Genitourinary:  Deferred.  Musculoskeletal:       Cervical back: Normal.       Thoracic back: She exhibits tenderness and spasm. She exhibits no bony tenderness, no swelling and no deformity.       Lumbar back: She exhibits tenderness and spasm. She exhibits no deformity and no pain.  Remainder of exam unremarkable.  Lymphadenopathy:       Head (right side): No submental, no submandibular, no tonsillar, no preauricular, no posterior auricular and no occipital adenopathy present.       Head (left side): No submental, no submandibular, no tonsillar, no preauricular, no posterior auricular and no occipital adenopathy present.    She has no cervical adenopathy.       Right: No inguinal and no supraclavicular adenopathy  present.       Left: No inguinal and no supraclavicular adenopathy present.  Neurological: She is alert and oriented to person, place, and time. She has normal strength. She displays no atrophy and no tremor. No cranial nerve deficit or sensory deficit. She exhibits normal muscle tone. Coordination and gait normal.  Reflex Scores:      Tricep reflexes are 1+ on the right side and 1+ on the left side.      Bicep reflexes are 1+ on the right side and 1+ on the left side.      Brachioradialis reflexes are 1+ on the right side and 1+ on the left side.      Patellar reflexes are 1+ on the right side and 1+ on the left side. Skin: Skin is warm, dry and intact. No ecchymosis, no lesion and no rash noted. She is not diaphoretic. No cyanosis or  erythema. Nails show no clubbing.  Psychiatric: Her speech is normal and behavior is normal. Judgment and thought content normal. Her mood appears not anxious. Her affect is not angry, not labile and not inappropriate. Cognition and memory are normal. She exhibits a depressed mood.  Flat affect but pt does engage in humorous conversation. PHQ-9 score= 12.       Assessment & Plan:  Routine general medical examination at a health care facility - Plan: Vit D  25 hydroxy (rtn osteoporosis monitoring), COMPLETE METABOLIC PANEL WITH GFR, CBC with Differential, Lipid panel  Other mixed anxiety disorders- Resume Citalopram 20 mg 1 tablet daily; also advised to get OTC supplements (L- methylfolate 7.5 mg  1 tablet daily); Fish Oil or Flax Seed daily can help reduce depressive symptoms.  Chronic nonintractable headache, unspecified headache type - Plan: Vit D  25 hydroxy (rtn osteoporosis monitoring), COMPLETE METABOLIC PANEL WITH GFR, CBC with Differential, Lipid panel  Vertebrogenic pain- Continue muscle relaxant and HC-APAP prn.   Meds ordered this encounter  Medications  . citalopram (CELEXA) 20 MG tablet    Sig: Take 1 tablet (20 mg total) by mouth daily.     Dispense:  30 tablet    Refill:  11  . valACYclovir (VALTREX) 500 MG tablet    Sig: Take 1 tablet (500 mg total) by mouth 2 (two) times daily.    Dispense:  60 tablet    Refill:  11  . HYDROcodone-acetaminophen (NORCO) 10-325 MG per tablet    Sig: Take 1 tablet every 6-8 hours prn headache or pain.    Dispense:  40 tablet    Refill:  0

## 2013-11-28 NOTE — Patient Instructions (Addendum)
FAX number for [facility] [address] is [phone].  The supplement to help with depression: L- methylfolate 7.5 mg  1 tablet daily. See if you can find this at [facility].  Fish Oil 1200 mg or Flax Seed Oil 1000-1200 mg daily can help reduce joint aches and pains as well as decrease depressive symptoms.   Keeping You Healthy  Get These Tests 1. Blood Pressure- Have your blood pressure checked once a year by your health care provider.  Normal blood pressure is 120/80. 2. Weight- Have your body mass index (BMI) calculated to screen for obesity.  BMI is measure of body fat based on height and weight.  You can also calculate your own BMI at https://www.west-esparza.com/. 3. Cholesterol- Have your cholesterol checked every 5 years starting at age 50 then yearly starting at age 78. 4. Chlamydia, HIV, and other sexually transmitted diseases- Get screened every year until age 48, then within three months of each new sexual provider. 5. Pap Smear- Every 1-3 years; discuss with your health care provider. 6. Mammogram- Every year starting at age 36  Take these medicines  Calcium with Vitamin D-Your body needs 1200 mg of Calcium each day and [phone] IU of Vitamin D daily.  Your body can only absorb 500 mg of Calcium at a time so Calcium must be taken in 2 or 3 divided doses throughout the day.  Multivitamin with folic acid- Once daily if it is possible for you to become pregnant.  Get these Immunizations  Gardasil-Series of three doses; prevents HPV related illness such as genital warts and cervical cancer.  Menactra-Single dose; prevents meningitis.  Tetanus shot- Every 10 years.  Flu shot-Every year.  Take these steps 1. Do not smoke-Your healthcare provider can help you quit.  For tips on how to quit go to www.smokefree.gov or call 1-800 QUITNOW. 2. Be physically active- Exercise 5 days a week for at least 30 minutes.  If you are not already physically active, start slow and gradually work  up to 30 minutes of moderate physical activity.  Examples of moderate activity include walking briskly, dancing, swimming, bicycling, etc. 3. Breast Cancer- A self breast exam every month is important for early detection of breast cancer.  For more information and instruction on self breast exams, ask your healthcare provider or [email or web address]. 4. Eat a healthy diet- Eat a variety of healthy foods such as fruits, vegetables, whole grains, low fat milk, low fat cheeses, yogurt, lean meats, poultry and fish, beans, nuts, tofu, etc.  For more information go to www. [employer]utritionsource.org 5. Drink alcohol in moderation- Limit alcohol intake to one drink or less per day. Never drink and drive. 6. Depression- Your emotional health is as important as your physical health.  If you're feeling down or losing interest in things you normally enjoy please talk to your healthcare provider about being screened for depression. 7. Dental visit- Brush and floss your teeth twice daily; visit your dentist twice a year. 8. Eye doctor- Get an eye exam at least every 2 years. 9. Helmet use- Always wear a helmet when riding a bicycle, motorcycle, rollerblading or skateboarding. 10. Safe sex- If you may be exposed to sexually transmitted infections, use a condom. 11. Seat belts- Seat belts can save your live; always wear one. 12. Smoke/Carbon Monoxide detectors- These detectors need to be installed on the appropriate level of your home. Replace batteries at least once a year. 13. Skin cancer- When out in the sun please cover up and use  sunscreen 15 SPF or higher. 14. Violence- If anyone is threatening or hurting you, please tell your healthcare provider.         Mediterranean Diet  Why follow it? Research shows.   Those who follow the Mediterranean diet have a reduced risk of heart disease    The diet is associated with a reduced incidence of Parkinson's and Alzheimer's diseases   People  following the diet may have longer life expectancies and lower rates of chronic diseases    The Dietary Guidelines for Americans recommends the Mediterranean diet as an eating plan to promote health and prevent disease  What Is the Mediterranean Diet?    Healthy eating plan based on typical foods and recipes of Mediterranean-style cooking   The diet is primarily a plant based diet; these foods should make up a majority of meals   Starches - Plant based foods should make up a majority of meals - They are an important sources of vitamins, minerals, energy, antioxidants, and fiber - Choose whole grains, foods high in fiber and minimally processed items  - Typical grain sources include wheat, oats, barley, corn, brown rice, bulgar, farro, millet, polenta, couscous  - Various types of beans include chickpeas, lentils, fava beans, black beans, white beans   Fruits  Veggies - Large quantities of antioxidant rich fruits & veggies; 6 or more servings  - Vegetables can be eaten raw or lightly drizzled with oil and cooked  - Vegetables common to the traditional Mediterranean Diet include: artichokes, arugula, beets, broccoli, brussel sprouts, cabbage, carrots, celery, collard greens, cucumbers, eggplant, kale, leeks, lemons, lettuce, mushrooms, okra, onions, peas, peppers, potatoes, pumpkin, radishes, rutabaga, shallots, spinach, sweet potatoes, turnips, zucchini - Fruits common to the Mediterranean Diet include: apples, apricots, avocados, cherries, clementines, dates, figs, grapefruits, grapes, melons, nectarines, oranges, peaches, pears, pomegranates, strawberries, tangerines  Fats - Replace butter and margarine with healthy oils, such as olive oil, canola oil, and tahini  - Limit nuts to no more than a handful a day  - Nuts include walnuts, almonds, pecans, pistachios, pine nuts  - Limit or avoid candied, honey roasted or heavily salted nuts - Olives are central to the [employer] - can be eaten  whole or used in a variety of dishes   Meats Protein - Limiting red meat: no more than a few times a month - When eating red meat: choose lean cuts and keep the portion to the size of deck of cards - Eggs: approx. 0 to 4 times a week  - Fish and lean poultry: at least 2 a week  - Healthy protein sources include, chicken, [place], lean beef, lamb - Increase intake of seafood such as tuna, salmon, trout, mackerel, shrimp, scallops - Avoid or limit high fat processed meats such as sausage and bacon  Dairy - Include moderate amounts of low fat dairy products  - Focus on healthy dairy such as fat free yogurt, skim milk, low or reduced fat cheese - Limit dairy products higher in fat such as whole or 2% milk, cheese, ice cream  Alcohol - Moderate amounts of red wine is ok  - No more than 5 oz daily for women (all ages) and men older than age 73  - No more than 10 oz of wine daily for men younger than 36  Other - Limit sweets and other desserts  - Use herbs and spices instead of salt to flavor foods  - Herbs and spices common to the traditional Mediterranean Diet  include: basil, bay leaves, chives, cloves, cumin, fennel, garlic, lavender, marjoram, mint, oregano, parsley, pepper, rosemary, sage, savory, sumac, tarragon, thyme   It's not just a diet, it's a lifestyle:    The Mediterranean diet includes lifestyle factors typical of those in the region    Foods, drinks and meals are best eaten with others and savored   Daily physical activity is important for overall good health   This could be strenuous exercise like running and aerobics   This could also be more leisurely activities such as walking, housework, yard-work, or taking the stairs   Moderation is the key; a balanced and healthy diet accommodates most foods and drinks   Consider portion sizes and frequency of consumption of certain foods   Meal Ideas & Options:    Breakfast:  o Whole wheat toast or whole wheat English muffins with peanut  butter & hard boiled egg o Steel cut oats topped with apples & cinnamon and skim milk  o Fresh fruit: banana, strawberries, melon, berries, peaches  o Smoothies: strawberries, bananas, greek yogurt, peanut butter o Low fat greek yogurt with blueberries and granola  o Egg white omelet with spinach and mushrooms o Breakfast couscous: whole wheat couscous, apricots, skim milk, cranberries    Sandwiches:  o Hummus and grilled vegetables (peppers, zucchini, squash) on whole wheat bread   o Grilled chicken on whole wheat pita with lettuce, tomatoes, cucumbers or tzatziki  o Tuna salad on whole wheat bread: tuna salad made with greek yogurt, olives, red peppers, capers, green onions o Garlic rosemary lamb pita: lamb sauted with garlic, rosemary, salt & pepper; add lettuce, cucumber, greek yogurt to pita - flavor with lemon juice and black pepper    Seafood:  o Mediterranean grilled salmon, seasoned with garlic, basil, parsley, lemon juice and black pepper o Shrimp, lemon, and spinach whole-grain pasta salad made with low fat greek yogurt  o Seared scallops with lemon orzo  o Seared tuna steaks seasoned salt, pepper, coriander topped with tomato mixture of olives, tomatoes, olive oil, minced garlic, parsley, green onions and cappers    Meats:  o Herbed greek chicken salad with kalamata olives, cucumber, feta  o Red bell peppers stuffed with spinach, bulgur, lean ground beef (or lentils) & topped with feta   o Kebabs: skewers of chicken, tomatoes, onions, zucchini, squash  o [place] burgers: made with red onions, mint, dill, lemon juice, feta cheese topped with roasted red peppers   Vegetarian o Cucumber salad: cucumbers, artichoke hearts, celery, red onion, feta cheese, tossed in olive oil & lemon juice  o Hummus and whole grain pita points with a greek salad (lettuce, tomato, feta, olives, cucumbers, red onion) o Lentil soup with celery, carrots made with vegetable broth, garlic, salt and pepper   o Tabouli salad: parsley, bulgur, mint, scallions, cucumbers, tomato, radishes, lemon juice, olive oil, salt and pepper. o

## 2013-11-28 NOTE — Telephone Encounter (Signed)
Pt saw dr Audria Nine today and was going to wait on blood work because she had another appt today and is calling back to state that the liver enzyme test was not done at other appt and would like it added to todays labs if possible  Best number [phone]

## 2013-11-29 LAB — VITAMIN D 25 HYDROXY (VIT D DEFICIENCY, FRACTURES): Vit D, 25-Hydroxy: 25 ng/mL — ABNORMAL LOW (ref 30–89)

## 2013-11-29 NOTE — Telephone Encounter (Signed)
Liver enzymes were checked. Dr. Audria Nine please review labs

## 2013-11-30 NOTE — Telephone Encounter (Signed)
Labs have been reviewed and released to pt along with comments. Thanks.

## 2013-12-04 ENCOUNTER — Telehealth: Payer: Self-pay

## 2013-12-04 MED ORDER — L-METHYLFOLATE-B6-B12 3-35-2 MG PO TABS: 1.0000 | ORAL_TABLET | Freq: Two times a day (BID) | ORAL | Status: DC

## 2013-12-04 NOTE — Telephone Encounter (Signed)
I recommended this supplement to pt; I will prescribe it. Thanks.

## 2013-12-04 NOTE — Telephone Encounter (Signed)
Pharm reqs Rx for L-methyl - B6-B12. Dr Audria Nine, I don't see that you have Rxd this for pt before. Do you want to?

## 2013-12-05 ENCOUNTER — Other Ambulatory Visit: Payer: Self-pay

## 2013-12-05 MED ORDER — CITALOPRAM HYDROBROMIDE 20 MG PO TABS: 20.0000 mg | ORAL_TABLET | Freq: Every day | ORAL | Status: DC

## 2013-12-05 MED ORDER — OMEPRAZOLE 20 MG PO CPDR: DELAYED_RELEASE_CAPSULE | ORAL | Status: DC

## 2013-12-05 MED ORDER — VALACYCLOVIR HCL 500 MG PO TABS: 500.0000 mg | ORAL_TABLET | Freq: Two times a day (BID) | ORAL | Status: DC

## 2013-12-05 NOTE — Telephone Encounter (Signed)
reqs from [facility] for valtrex, citalopram and omeprazole. I have recent the Rxs that were sent to local pharm at [facility] on 11/28/13 for first two Rxs. Dr Audria Nine, I don't see GERD discussed at [facility]. OK to RF?

## 2013-12-05 NOTE — Telephone Encounter (Signed)
Omeprazole refilled.

## 2014-01-16 ENCOUNTER — Other Ambulatory Visit: Payer: Self-pay

## 2014-01-27 ENCOUNTER — Ambulatory Visit (INDEPENDENT_AMBULATORY_CARE_PROVIDER_SITE_OTHER): Payer: 59 | Admitting: Family Medicine

## 2014-01-27 VITALS — BP 134/84 | HR 92 | Temp 98.2°F | Resp 18 | Ht 64.0 in | Wt 227.2 lb

## 2014-01-27 DIAGNOSIS — M62830 Muscle spasm of back: Secondary | ICD-10-CM

## 2014-01-27 MED ORDER — KETOROLAC TROMETHAMINE 60 MG/2ML IM SOLN
60.0000 mg | Freq: Once | INTRAMUSCULAR | Status: AC
  Administered 2014-01-27: 60 mg via INTRAMUSCULAR

## 2014-01-27 MED ORDER — PREDNISONE 20 MG PO TABS: 40.0000 mg | ORAL_TABLET | Freq: Every day | ORAL | Status: DC

## 2014-01-27 MED ORDER — HYDROCODONE-ACETAMINOPHEN 10-325 MG PO TABS: ORAL_TABLET | ORAL | Status: DC

## 2014-01-27 NOTE — Progress Notes (Signed)
Patient ID: Angela CairoSheila R Farrell, female   DOB: Dec 19, 1975, 38 y.o.   MRN: [id number]  This chart was scribed for Elvina SidleKurt Jammi Morrissette, MD by Charline BillsEssence Howell, ED Scribe. The patient was seen in room 12. Patient's care was started at 12:39 PM.  Patient ID: Angela CairoSheila R Farrell MRN: [id number], DOB: Dec 19, 1975, 38 y.o. Date of Encounter: 01/27/2014, 12:39 PM  Primary Physician: No PCP Per Patient  Chief Complaint  Patient presents with  . Back Pain    back spasm--last week since wed or thurs--left side of the back--under the shoulder to the waist--    HPI: 38 y.o. year old female with history below presents with intermittent L sided lower back pain onset 4 days ago. She reports associated SOB due to severity of pain. Pain is exacerbated with taking deep breaths and talking. Pt denies injury or heavy lifting. She reports similar spasms in the past that resolved with Flexeril and Vicodin. Pt has tried Norco and Robaxin with temporary relief. She has seen a chiropractor in the past for neck pain with relief.   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   Past Medical History  Diagnosis Date  . Anxiety   . Meningitis, viral At age 38 and at age 38  . Other and unspecified ovarian cysts 2011- pelvic CT    GYN- Dr. Chevis PrettyMezer  . Anemia   . Depression   . Chronic headache disorder     s/p Meningitis x 2, tension- type plus allergic/sinus problems  . History of abnormal Pap smear   . Nasal fracture 2011  . Arthritis   . Allergy      Home Meds: Prior to Admission medications   Medication Sig Start Date End Date Taking? Authorizing Provider  citalopram (CELEXA) 20 MG tablet Take 1 tablet (20 mg total) by mouth daily. 12/05/13  Yes Maurice MarchBarbara B McPherson, MD  etonogestrel (NEXPLANON) 68 MG IMPL implant Inject 1 each into the skin once.   Yes Historical Provider, MD  HYDROcodone-acetaminophen (NORCO) 10-325 MG per tablet Take 1 tablet every 6-8 hours prn headache or pain. 11/28/13  Yes Maurice MarchBarbara B McPherson, MD    methocarbamol (ROBAXIN) 500 MG tablet TAKE 1 TABLET BY MOUTH 4 (FOUR) TIMES DAILY AS NEEDED FOR MUSCLE SPASMS. 11/19/13  Yes Elvina SidleKurt Yadiel Aubry, MD  omeprazole (PRILOSEC) 20 MG capsule TAKE ONE CAPSULE BY MOUTH EVERY DAY 12/05/13  Yes Maurice MarchBarbara B McPherson, MD  valACYclovir (VALTREX) 500 MG tablet Take 1 tablet (500 mg total) by mouth 2 (two) times daily. 12/05/13  Yes Maurice MarchBarbara B McPherson, MD  l-methylfolate-B6-B12 [facility](METANX) 3-35-2 MG TABS Take 1 tablet by mouth 2 (two) times daily. 12/04/13   Maurice MarchBarbara B McPherson, MD    Allergies: No Known Allergies  History   Social History  . Marital Status: Single    Spouse Name: N/A    Number of Children: N/A  . Years of Education: N/A   Occupational History  . GIS Analyst    Social History Main Topics  . Smoking status: Current Some Day Smoker  . Smokeless tobacco: Not on file  . Alcohol Use: Yes  . Drug Use: No  . Sexual Activity: Not on file   Other Topics Concern  . Not on file   Social History Narrative  . No narrative on file     Review of Systems: Constitutional: negative for chills, fever, night sweats, weight changes, or fatigue  HEENT: negative for vision changes, hearing loss, congestion, rhinorrhea, ST, epistaxis, or sinus  pressure Cardiovascular: negative for chest pain or palpitations Respiratory: negative for hemoptysis, wheezing, or cough, +shortness of breath Abdominal: negative for abdominal pain, nausea, vomiting, diarrhea, or constipation Msk: +back pain Dermatological: negative for rash Neurologic: negative for headache, dizziness, or syncope All other systems reviewed and are otherwise negative with the exception to those above and in the HPI.  Physical Exam: Triage Vitals: Blood pressure 134/84, pulse 92, temperature 98.2 F (36.8 C), temperature source Oral, resp. rate 18, height 5\' 4"  (1.626 m), weight 227 lb 3.2 oz (103.057 kg), SpO2 100.00%., Body mass index is 38.98 kg/(m^2). General: Well developed, well nourished,  in no acute distress. Head: Normocephalic, atraumatic, eyes without discharge, sclera non-icteric, nares are without discharge. Bilateral auditory canals clear, TM's are without perforation, pearly grey and translucent with reflective cone of light bilaterally. Oral cavity moist, posterior pharynx without exudate, erythema, peritonsillar abscess, or post nasal drip.  Neck: Supple. No thyromegaly. Full ROM. No lymphadenopathy. Lungs: Clear bilaterally to auscultation without wheezes, rales, or rhonchi. Breathing is unlabored. Heart: RRR with S1 S2. No murmurs, rubs, or gallops appreciated. Abdomen: Soft, non-tender, non-distended with normoactive bowel sounds. No hepatomegaly. No rebound/guarding. No obvious abdominal masses. Msk:  Strength and tone normal for age. Tender over L mid ribs. No scoliosis. Extremities/Skin: Warm and dry. No clubbing or cyanosis. No edema. No rashes or suspicious lesions. Neuro: Alert and oriented X 3. Moves all extremities spontaneously. Gait is normal. CNII-XII grossly in tact. Psych:  Responds to questions appropriately with a normal affect.    ASSESSMENT AND PLAN:  38 y.o. year old female with  1. Back spasm   Back spasm - Plan: ketorolac (TORADOL) injection 60 mg, predniSONE (DELTASONE) 20 MG tablet, HYDROcodone-acetaminophen (NORCO) 10-325 MG per tablet   I personally performed the services described in this documentation, which was scribed in my presence. The recorded information has been reviewed and is accurate.  Signed, Elvina SidleKurt Jaedin Trumbo, MD 01/27/2014 12:39 PM

## 2014-06-15 ENCOUNTER — Ambulatory Visit (INDEPENDENT_AMBULATORY_CARE_PROVIDER_SITE_OTHER): Payer: Self-pay | Admitting: Family Medicine

## 2014-06-15 ENCOUNTER — Encounter: Payer: Self-pay | Admitting: Family Medicine

## 2014-06-15 VITALS — BP 130/80 | HR 91 | Temp 98.7°F | Resp 16 | Ht 64.0 in | Wt 214.8 lb

## 2014-06-15 DIAGNOSIS — M62830 Muscle spasm of back: Secondary | ICD-10-CM | POA: Diagnosis not present

## 2014-06-15 DIAGNOSIS — G8929 Other chronic pain: Secondary | ICD-10-CM

## 2014-06-15 DIAGNOSIS — R51 Headache: Secondary | ICD-10-CM

## 2014-06-15 DIAGNOSIS — L600 Ingrowing nail: Secondary | ICD-10-CM | POA: Diagnosis not present

## 2014-06-15 DIAGNOSIS — M79674 Pain in right toe(s): Secondary | ICD-10-CM

## 2014-06-15 DIAGNOSIS — R519 Headache, unspecified: Secondary | ICD-10-CM

## 2014-06-15 MED ORDER — HYDROCODONE-ACETAMINOPHEN 10-325 MG PO TABS: ORAL_TABLET | ORAL | Status: DC

## 2014-06-15 MED ORDER — CEPHALEXIN 500 MG PO CAPS: 500.0000 mg | ORAL_CAPSULE | Freq: Three times a day (TID) | ORAL | Status: DC

## 2014-06-15 NOTE — Patient Instructions (Signed)
INGROWN TOENAIL . Keep area clean, dry and bandaged for 24 hours. . After 24 hours, remove outer bandage and leave yellow gauze in place. Angela Farrell. Soak toe/foot in warm soapy water for 5-10 minutes, once daily for 5 days. Rebandage toe after each cleaning. . Continue soaks until yellow gauze falls off. . Notify the office if you experience any of the following signs of infection: Swelling, redness, pus drainage, streaking, fever > 101.0 F   Ingrown Toenail An ingrown toenail occurs when the sharp edge of your toenail grows into the skin. Causes of ingrown toenails include toenails clipped too far back or poorly fitting shoes. Activities involving sudden stops (basketball, tennis) causing "toe jamming" may lead to an ingrown nail. HOME CARE INSTRUCTIONS   Soak the whole foot in warm soapy water for 20 minutes, 3 times per day.  You may lift the edge of the nail away from the sore skin by wedging a small piece of cotton under the corner of the nail. Be careful not to dig (traumatize) and cause more injury to the area.  Wear shoes that fit well. While the ingrown nail is causing problems, sandals may be beneficial.  Trim your toenails regularly and carefully. Cut your toenails straight across, not in a curve. This will prevent injury to the skin at the corners of the toenail.  Keep your feet clean and dry.  Crutches may be helpful early in treatment if walking is painful.  Antibiotics, if prescribed, should be taken as directed.  Return for a wound check in 2 days or as directed.  Only take over-the-counter or prescription medicines for pain, discomfort, or fever as directed by your caregiver. SEEK IMMEDIATE MEDICAL CARE IF:   You have a fever.  You have increasing pain, redness, swelling, or heat at the wound site.  Your toe is not better in 7 days. If conservative treatment is not successful, surgical removal of a portion or all of the nail may be necessary. MAKE SURE YOU:    Understand these instructions.  Will watch your condition.  Will get help right away if you are not doing well or get worse. Document Released: 03/17/2000 Document Revised: 06/12/2011 Document Reviewed: 03/11/2008 [facility] Patient Information 2015 [place]. This information is not intended to replace advice given to you by your health care provider. Make sure you discuss any questions you have with your health care provider.

## 2014-06-15 NOTE — Progress Notes (Signed)
Subjective:    [name], female    DOB: [birth year], 39 y.o.   MRN: [id number]  06/15/2014  toe pain and Medication Refill   HPI This 39 y.o. female presents for evaluation of toe pain onset yesterday.  No injury.  Toe is very tender and swollen today.  S/p recent toenail trimming by podiatrist; pt concerned that recent trimming of nails is contributing to current toe pain.  No bruising.  No fever/chills/sweats. Pain with wearing shoe.  No drainage.  No n/t of toe. No similar pain in the past.    Chronic headaches: requesting refill of hydrocodone.  Takes hydrocodone sparingly for chronic headaches since suffering with meningitis at age 44.  No recent worsening of headaches. No new features to headaches.   Review of Systems  Constitutional: Negative for fever, chills, diaphoresis and fatigue.  Musculoskeletal: Positive for arthralgias.  Skin: Positive for color change. Negative for pallor, rash and wound.  Neurological: Positive for headaches. Negative for dizziness, tremors, seizures, syncope, facial asymmetry, speech difficulty, weakness and numbness.    Past Medical History  Diagnosis Date  . Anxiety   . Meningitis, viral At age 47 and at age 54  . Other and unspecified ovarian cysts 2011- pelvic CT    GYN- Dr. Chevis Pretty  . Anemia   . Depression   . Chronic headache disorder     s/p Meningitis x 2, tension- type plus allergic/sinus problems  . History of abnormal Pap smear   . Nasal fracture 2011  . Arthritis   . Allergy    Past Surgical History  Procedure Laterality Date  . Foot surgery  s/p 2 procedures  . Laparoscopy for ectopic pregnancy  1998    Fallopian tube removed  . Uterine artery embolization     No Known Allergies      Objective:    BP 130/80 mmHg  Pulse 91  Temp(Src) 98.7 F (37.1 C) (Oral)  Resp 16  Ht  (1.626 m)  Wt 214 lb 12.8 oz (97.433 kg)  BMI 36.85 kg/m2  SpO2 98% Physical Exam  Constitutional: She is oriented to  person, place, and time. She appears well-developed and well-nourished. No distress.  HENT:  Head: Normocephalic and atraumatic.  Eyes: Conjunctivae are normal. Pupils are equal, round, and reactive to light.  Neck: Normal range of motion. Neck supple.  Cardiovascular: Normal rate, regular rhythm and normal heart sounds.  Exam reveals no gallop and no friction rub.   No murmur heard. Pulmonary/Chest: Effort normal and breath sounds normal. She has no wheezes. She has no rales.  Musculoskeletal:  R first toe: +TTP distal lateral toe; no IP joint swelling or warmth; no pain with movement of first IP joint on R.  Neurological: She is alert and oriented to person, place, and time. No cranial nerve deficit. She exhibits normal muscle tone. Coordination normal.  Skin: She is not diaphoretic.  R first toe:  +swelling and mild erythema with warmth along lateral edige of distal toe with tenderness at distal aspect of lateral nail edge.  No fluctuants; +induration.  No streaking.    Psychiatric: She has a normal mood and affect. Her behavior is normal.  Nursing note and vitals reviewed.  PROCEDURE NOTE:  VERBAL CONSENT OBTAINED; 5 CC OF LIDOCAINE 2% PLAIN ADMINISTERED VIA DIGITAL BLOCK TO R FIRST TOE; STERILE PREP PERFORMED; LATERAL 1/3 OF NAIL RESECTED; PT TOLERATED PROCEDURE WELL; VASELINE GAUZE APPLIED TO NAILBED. TOE BANDAGED.     Assessment &  Plan:   1. Ingrown right big toenail   2. Great toe pain, right   3. Chronic nonintractable headache, unspecified headache type   4. Back spasm     1. R first toe pain: New. Secondary to ingrown toenail.  Rx for Hydrocodone provided. 2.  R first toe ingrown toenail: New.  S/p partial nail resection in office. Rx for Keflex provided; local wound care reviewed.  RTC immediately for worsening pain, development of fever, increasing swelling, or concerns. 3. Chronic headaches after meningitis: stable; refill of Hydrocodone provided.   Meds ordered this  encounter  Medications  . HYDROcodone-acetaminophen (NORCO) 10-325 MG per tablet    Sig: Take 1 tablet every 6-8 hours prn headache or pain.    Dispense:  20 tablet    Refill:  0  . cephALEXin (KEFLEX) 500 MG capsule    Sig: Take 1 capsule (500 mg total) by mouth 3 (three) times daily.    Dispense:  21 capsule    Refill:  0    No Follow-up on file.     Kyonna Frier Paulita FujitaMartin Lorris Carducci, M.D. [facility]  [place] [address] [place], [place]  [id number] [phone] phone [phone] fax

## 2014-08-18 ENCOUNTER — Ambulatory Visit (INDEPENDENT_AMBULATORY_CARE_PROVIDER_SITE_OTHER): Payer: 59 | Admitting: Family Medicine

## 2014-08-18 ENCOUNTER — Other Ambulatory Visit: Payer: Self-pay | Admitting: Family Medicine

## 2014-08-18 ENCOUNTER — Ambulatory Visit (HOSPITAL_BASED_OUTPATIENT_CLINIC_OR_DEPARTMENT_OTHER)
Admission: RE | Admit: 2014-08-18 | Discharge: 2014-08-18 | Disposition: A | Discharge status: Home / Self Care | Payer: 59 | Source: Ambulatory Visit | Attending: Family Medicine | Admitting: Family Medicine

## 2014-08-18 VITALS — BP 144/90 | HR 95 | Temp 98.1°F | Resp 16 | Ht 64.0 in | Wt 214.0 lb

## 2014-08-18 DIAGNOSIS — R1031 Right lower quadrant pain: Secondary | ICD-10-CM | POA: Insufficient documentation

## 2014-08-18 DIAGNOSIS — R509 Fever, unspecified: Secondary | ICD-10-CM | POA: Diagnosis not present

## 2014-08-18 DIAGNOSIS — R102 Pelvic and perineal pain: Secondary | ICD-10-CM | POA: Insufficient documentation

## 2014-08-18 DIAGNOSIS — R05 Cough: Secondary | ICD-10-CM

## 2014-08-18 DIAGNOSIS — R1032 Left lower quadrant pain: Secondary | ICD-10-CM

## 2014-08-18 DIAGNOSIS — R059 Cough, unspecified: Secondary | ICD-10-CM

## 2014-08-18 LAB — POCT URINALYSIS DIPSTICK
Bilirubin, UA: NEGATIVE
Blood, UA: NEGATIVE
Glucose, UA: NEGATIVE
Ketones, UA: NEGATIVE
LEUKOCYTES UA: NEGATIVE
NITRITE UA: NEGATIVE
PH UA: 8
PROTEIN UA: NEGATIVE
Spec Grav, UA: 1.01
UROBILINOGEN UA: 0.2

## 2014-08-18 LAB — COMPREHENSIVE METABOLIC PANEL
ALT: 46 U/L — ABNORMAL HIGH (ref 0–35)
AST: 30 U/L (ref 0–37)
Albumin: 3.9 g/dL (ref 3.5–5.2)
Alkaline Phosphatase: 63 U/L (ref 39–117)
BUN: 5 mg/dL — AB (ref 6–23)
CALCIUM: 8.8 mg/dL (ref 8.4–10.5)
CO2: 22 mEq/L (ref 19–32)
CREATININE: 0.6 mg/dL (ref 0.50–1.10)
Chloride: 103 mEq/L (ref 96–112)
Glucose, Bld: 89 mg/dL (ref 70–99)
Potassium: 4.2 mEq/L (ref 3.5–5.3)
Sodium: 136 mEq/L (ref 135–145)
Total Bilirubin: 0.8 mg/dL (ref 0.2–1.2)
Total Protein: 7 g/dL (ref 6.0–8.3)

## 2014-08-18 LAB — POCT UA - MICROSCOPIC ONLY
Bacteria, U Microscopic: NEGATIVE
Casts, Ur, LPF, POC: NEGATIVE
Crystals, Ur, HPF, POC: NEGATIVE
Epithelial cells, urine per micros: NEGATIVE
Mucus, UA: NEGATIVE
WBC, Ur, HPF, POC: NEGATIVE
Yeast, UA: NEGATIVE

## 2014-08-18 LAB — POCT CBC
GRANULOCYTE PERCENT: 67.3 % (ref 37–80)
HEMATOCRIT: 36.2 % — AB (ref 37.7–47.9)
Hemoglobin: 11.5 g/dL — AB (ref 12.2–16.2)
Lymph, poc: 2.6 (ref 0.6–3.4)
MCH, POC: 27.5 pg (ref 27–31.2)
MCHC: 31.8 g/dL (ref 31.8–35.4)
MCV: 86.5 fL (ref 80–97)
MID (cbc): 0.5 (ref 0–0.9)
MPV: 7.7 fL (ref 0–99.8)
PLATELET COUNT, POC: 373 10*3/uL (ref 142–424)
POC Granulocyte: 6.5 (ref 2–6.9)
POC LYMPH PERCENT: 27.6 %L (ref 10–50)
POC MID %: 5.1 %M (ref 0–12)
RBC: 4.18 M/uL (ref 4.04–5.48)
RDW, POC: 13.5 %
WBC: 9.6 10*3/uL (ref 4.6–10.2)

## 2014-08-18 LAB — POCT INFLUENZA A/B
INFLUENZA B, POC: NEGATIVE
Influenza A, POC: NEGATIVE

## 2014-08-18 LAB — POCT URINE PREGNANCY: Preg Test, Ur: NEGATIVE

## 2014-08-18 MED ORDER — HYDROCODONE-HOMATROPINE 5-1.5 MG/5ML PO SYRP: 5.0000 mL | ORAL_SOLUTION | Freq: Three times a day (TID) | ORAL | Status: DC | PRN

## 2014-08-18 NOTE — Patient Instructions (Signed)
After your ultrasound is done you can go on home and I will give you a call.    Please go to [facility] at 5:30 for your ultrasound- register in the ER as an outpatient (you do not need [facility]  [facility] ?   Address: [address], [place], [place] [address]  [phone]  If your ultrasound does not reveal the reason for your pain and you continue to have pain, please see us first thing in the morning or go to the ER overnight if you are getting worse   I will be in touch with the rest of your labs as well

## 2014-08-18 NOTE — Progress Notes (Signed)
[facility] [address], [place] [address] [phone]  Date:  08/18/2014   Name:  Angela Farrell   DOB:  [birth year]   MRN:  [id number]  PCP:  Dow AdolphMCPHERSON,BARBARA, MD    Chief Complaint: Abdominal Pain; Cough; Nasal Congestion; Fever; and Chills   History of Present Illness:  Angela Farrell is a 39 y.o. very pleasant female patient who presents with the following:  Today is Tuesday. On Friday she became ill rather suddenly.  Thursday night she noted a bit of a cough.  Friday am she noted a dry and scratchy throat, cough, fever and chills. She has noted bodyaches.  She has also noted some RLQ tenderness over the last 2 days- she notes this more with cough or urination.  She last had a fever Friday evening/ Saturday am.  Her temp was about 102.  No more recent fevers She has not noted any other urinary symptoms- no dysuria, frequency or hematuria No vomiting except from cough.   Appetite has been ok- she is still eating normally At home they have tried some cold preps, tylenol She notes a history of viral meningitis and did start her valtrex a couple of days ago in case this had come back.  However this does not seem like viral meningitis sx she had in the past She has had ovarian cysts before  nexplanon placed about 18 months ago Patient Active Problem List   Diagnosis Date Noted  . Anxiety disorder 01/18/2012  . Chronic anemia 10/14/2011  . Viral meningitis 09/26/2011  . Chronic headache disorder 04/18/2011  . Obesity 04/18/2011  . Allergic rhinitis 04/18/2011    Class: Chronic    Past Medical History  Diagnosis Date  . Anxiety   . Meningitis, viral At age 39 and at age 39  . Other and unspecified ovarian cysts 2011- pelvic CT    GYN- Dr. Chevis PrettyMezer  . Anemia   . Depression   . Chronic headache disorder     s/p Meningitis x 2, tension- type plus allergic/sinus problems  . History of abnormal Pap smear   . Nasal fracture 2011  . Arthritis   .  Allergy     Past Surgical History  Procedure Laterality Date  . Foot surgery  s/p 2 procedures  . Laparoscopy for ectopic pregnancy  1998    Fallopian tube removed  . Uterine artery embolization      History  Substance Use Topics  . Smoking status: Current Some Day Smoker  . Smokeless tobacco: Not on file  . Alcohol Use: Yes    Family History  Problem Relation Age of Onset  . Migraines Father     cluster headache  . Hyperlipidemia Father   . Hypertension Father   . Cancer Mother   . Diabetes      paternal relaitve  . Seizures      maternal relative  . Rheum arthritis      maternal relatives  . Stroke Maternal Grandmother   . Diabetes Paternal Grandmother     No Known Allergies  Medication list has been reviewed and updated.  Current Outpatient Prescriptions on File Prior to Visit  Medication Sig Dispense Refill  . etonogestrel (NEXPLANON) 68 MG IMPL implant Inject 1 each into the skin once.    Marland Kitchen. HYDROcodone-acetaminophen (NORCO) 10-325 MG per tablet Take 1 tablet every 6-8 hours prn headache or pain. 20 tablet 0  . l-methylfolate-B6-B12 (METANX) 3-35-2 MG TABS Take 1 tablet  by mouth 2 (two) times daily. 60 tablet 5  . omeprazole (PRILOSEC) 20 MG capsule TAKE ONE CAPSULE BY MOUTH EVERY DAY 90 capsule 3  . valACYclovir (VALTREX) 500 MG tablet Take 1 tablet (500 mg total) by mouth 2 (two) times daily. 180 tablet 3  . cephALEXin (KEFLEX) 500 MG capsule Take 1 capsule (500 mg total) by mouth 3 (three) times daily. (Patient not taking: Reported on 08/18/2014) 21 capsule 0  . citalopram (CELEXA) 20 MG tablet Take 1 tablet (20 mg total) by mouth daily. (Patient not taking: Reported on 08/18/2014) 90 tablet 3  . methocarbamol (ROBAXIN) 500 MG tablet TAKE 1 TABLET BY MOUTH 4 (FOUR) TIMES DAILY AS NEEDED FOR MUSCLE SPASMS. (Patient not taking: Reported on 08/18/2014) 60 tablet 5  . predniSONE (DELTASONE) 20 MG tablet Take 2 tablets (40 mg total) by mouth daily. (Patient not taking:  Reported on 06/15/2014) 10 tablet 0   No current facility-administered medications on file prior to visit.    Review of Systems:  As per HPI- otherwise negative.   Physical Examination: Filed Vitals:   08/18/14 1419  BP: 144/90  Pulse: 95  Temp: 98.1 F (36.7 C)  Resp: 16   Filed Vitals:   08/18/14 1419  Height:  (1.626 m)  Weight: 214 lb (97.07 kg)   Body mass index is 36.72 kg/(m^2). Ideal Body Weight: Weight in (lb) to have BMI = 25: 145.3  GEN: WDWN, NAD, Non-toxic, A & O x 3, lying down in room, does not appear to feel well HEENT: Atraumatic, Normocephalic. Neck supple. No masses, No LAD.  Bilateral TM wnl, oropharynx normal.  PEERL,EOMI.  No meningismus Ears and Nose: No external deformity. CV: RRR, No M/G/R. No JVD. No thrill. No extra heart sounds. PULM: CTA B, no wheezes, crackles, rhonchi. No retractions. No resp. distress. No accessory muscle use. ABD: S, ND, +BS. No rebound. No HSM.  She has mild RLQ tenderness on exam EXTR: No c/c/e NEURO Normal gait.  PSYCH: Normally interactive. Conversant. Not depressed or anxious appearing.  Calm demeanor.  Pelvic: normal, no vaginal lesions or discharge. Uterus normal, no CMT, no adnexal tendereness or masses   Results for orders placed or performed in visit on 08/18/14  POCT UA - Microscopic Only  Result Value Ref Range   WBC, Ur, HPF, POC neg    RBC, urine, microscopic 0-3    Bacteria, U Microscopic neg    Mucus, UA neg    Epithelial cells, urine per micros neg    Crystals, Ur, HPF, POC neg    Casts, Ur, LPF, POC neg    Yeast, UA neg   POCT urinalysis dipstick  Result Value Ref Range   Color, UA yellow    Clarity, UA clear    Glucose, UA neg    Bilirubin, UA neg    Ketones, UA neg    Spec Grav, UA 1.010    Blood, UA neg    pH, UA 8.0    Protein, UA neg    Urobilinogen, UA 0.2    Nitrite, UA neg    Leukocytes, UA Negative   POCT urine pregnancy  Result Value Ref Range   Preg Test, Ur Negative    POCT CBC  Result Value Ref Range   WBC 9.6 4.6 - 10.2 K/uL   Lymph, poc 2.6 0.6 - 3.4   POC LYMPH PERCENT 27.6 10 - 50 %L   MID (cbc) 0.5 0 - 0.9   POC MID % 5.1  0 - 12 %M   POC Granulocyte 6.5 2 - 6.9   Granulocyte percent 67.3 37 - 80 %G   RBC 4.18 4.04 - 5.48 M/uL   Hemoglobin 11.5 (A) 12.2 - 16.2 g/dL   HCT, POC 40.936.2 (A) 81.137.7 - 47.9 %   MCV 86.5 80 - 97 fL   MCH, POC 27.5 27 - 31.2 pg   MCHC 31.8 31.8 - 35.4 g/dL   RDW, POC 91.413.5 %   Platelet Count, POC 373 142 - 424 K/uL   MPV 7.7 0 - 99.8 fL  POCT Influenza A/B  Result Value Ref Range   Influenza A, POC Negative    Influenza B, POC Negative     Assessment and Plan: Other specified fever  Cough - Plan: POCT CBC, POCT Influenza A/B, HYDROcodone-homatropine (HYCODAN) 5-1.5 MG/5ML syrup  Right lower quadrant pain - Plan: POCT UA - Microscopic Only, POCT urinalysis dipstick, POCT urine pregnancy, Urine culture, Comprehensive metabolic panel, US Pelvis Complete  Here today with recent illness and fever, and also RLQ pain of 2 days duration which may be a separate issue.   Reassured that at this time she does not appear to have the flu or other acute dangerous illness.  Cough syrup to use as needed for her cough, symptomatic tx for her URI sx Discussed DDX of RLQ pain with pt and her husband. Appendicitis is always something to consider.  Offered US to look for ovarian cyst vs CT scan.  Explained that untreated appendicitis can lea to rupture and other complications.  At this time she chooses to do an US as she more suspects a recurrent cyst.  Plan for close follow-up if sx persist   Signed Abbe AmsterdamJessica Mandy Fitzwater, MD  Called to let her know that US showed a right ovarian cyst.  This is the likely cause of her pain.  Will give her a call tomorrow with formal report

## 2014-08-20 LAB — URINE CULTURE
COLONY COUNT: NO GROWTH
ORGANISM ID, BACTERIA: NO GROWTH

## 2014-08-25 ENCOUNTER — Encounter: Payer: Self-pay | Admitting: Family Medicine

## 2014-10-13 ENCOUNTER — Other Ambulatory Visit: Payer: Self-pay | Admitting: Family Medicine

## 2014-10-14 ENCOUNTER — Telehealth: Payer: Self-pay | Admitting: Family Medicine

## 2014-10-14 ENCOUNTER — Ambulatory Visit (INDEPENDENT_AMBULATORY_CARE_PROVIDER_SITE_OTHER): Payer: Commercial Managed Care - HMO

## 2014-10-14 ENCOUNTER — Ambulatory Visit (INDEPENDENT_AMBULATORY_CARE_PROVIDER_SITE_OTHER): Payer: Commercial Managed Care - HMO | Admitting: Family Medicine

## 2014-10-14 VITALS — BP 142/84 | HR 100 | Temp 97.8°F | Resp 18 | Ht 63.0 in | Wt 213.8 lb

## 2014-10-14 DIAGNOSIS — M546 Pain in thoracic spine: Secondary | ICD-10-CM | POA: Diagnosis not present

## 2014-10-14 DIAGNOSIS — M549 Dorsalgia, unspecified: Secondary | ICD-10-CM

## 2014-10-14 DIAGNOSIS — Z8661 Personal history of infections of the central nervous system: Secondary | ICD-10-CM

## 2014-10-14 DIAGNOSIS — R109 Unspecified abdominal pain: Secondary | ICD-10-CM | POA: Diagnosis not present

## 2014-10-14 DIAGNOSIS — B3749 Other urogenital candidiasis: Secondary | ICD-10-CM

## 2014-10-14 LAB — COMPLETE METABOLIC PANEL WITH GFR
ALT: 202 U/L — ABNORMAL HIGH (ref 0–35)
AST: 364 U/L — ABNORMAL HIGH (ref 0–37)
Albumin: 4.1 g/dL (ref 3.5–5.2)
Alkaline Phosphatase: 64 U/L (ref 39–117)
BUN: 6 mg/dL (ref 6–23)
Creat: 0.62 mg/dL (ref 0.50–1.10)
GFR, Est African American: >89 mL/min
GFR, Est Non African American: >89 mL/min
Glucose, Bld: 95 mg/dL (ref 70–99)
Sodium: 136 mEq/L (ref 135–145)
Total Bilirubin: 1.5 mg/dL — ABNORMAL HIGH (ref 0.2–1.2)
Total Protein: 6.9 g/dL (ref 6.0–8.3)

## 2014-10-14 LAB — POCT URINALYSIS DIPSTICK
Glucose, UA: NEGATIVE
Ketones, UA: NEGATIVE
Leukocytes, UA: NEGATIVE
Nitrite, UA: NEGATIVE
Protein, UA: NEGATIVE
Spec Grav, UA: 1.015
Urobilinogen, UA: 0.2
pH, UA: 7

## 2014-10-14 LAB — POCT CBC
Granulocyte percent: 74.9 %G (ref 37–80)
HCT, POC: 37 % — AB (ref 37.7–47.9)
Hemoglobin: 12.1 g/dL — AB (ref 12.2–16.2)
Lymph, poc: 1.6 (ref 0.6–3.4)
MCH, POC: 27.8 pg (ref 27–31.2)
MCHC: 32.8 g/dL (ref 31.8–35.4)
MCV: 84.8 fL (ref 80–97)
MID (cbc): 0.2 (ref 0–0.9)
MPV: 7.9 fL (ref 0–99.8)
POC Granulocyte: 5.2 (ref 2–6.9)
POC LYMPH PERCENT: 22.9 % (ref 10–50)
POC MID %: 2.2 %M (ref 0–12)
Platelet Count, POC: 313 10*3/uL (ref 142–424)
RBC: 4.36 M/uL (ref 4.04–5.48)
RDW, POC: 13.5 %
WBC: 7 10*3/uL (ref 4.6–10.2)

## 2014-10-14 LAB — POCT UA - MICROSCOPIC ONLY
Casts, Ur, LPF, POC: NEGATIVE
Crystals, Ur, HPF, POC: NEGATIVE
Mucus, UA: NEGATIVE
Yeast, UA: POSITIVE

## 2014-10-14 LAB — COMPLETE METABOLIC PANEL WITHOUT GFR
CO2: 22 meq/L (ref 19–32)
Calcium: 9 mg/dL (ref 8.4–10.5)
Chloride: 103 meq/L (ref 96–112)
Potassium: 4.3 meq/L (ref 3.5–5.3)

## 2014-10-14 MED ORDER — TIZANIDINE HCL 4 MG PO TABS: 4.0000 mg | ORAL_TABLET | Freq: Four times a day (QID) | ORAL | Status: DC | PRN

## 2014-10-14 MED ORDER — KETOROLAC TROMETHAMINE 60 MG/2ML IM SOLN
60.0000 mg | Freq: Once | INTRAMUSCULAR | Status: AC
  Administered 2014-10-14: 60 mg via INTRAMUSCULAR

## 2014-10-14 MED ORDER — PREDNISONE 20 MG PO TABS: ORAL_TABLET | ORAL | Status: DC

## 2014-10-14 MED ORDER — OXYCODONE-ACETAMINOPHEN 10-325 MG PO TABS: 1.0000 | ORAL_TABLET | Freq: Four times a day (QID) | ORAL | Status: DC | PRN

## 2014-10-14 MED ORDER — OXYCODONE HCL 5 MG PO TABS: 5.0000 mg | ORAL_TABLET | Freq: Four times a day (QID) | ORAL | Status: DC | PRN

## 2014-10-14 MED ORDER — ALPRAZOLAM 0.25 MG PO TABS
0.2500 mg | ORAL_TABLET | Freq: Once | ORAL | Status: AC
Start: 2014-10-14 — End: 2014-10-14
  Administered 2014-10-14: 0.25 mg via ORAL

## 2014-10-14 MED ORDER — DIAZEPAM 5 MG PO TABS: 5.0000 mg | ORAL_TABLET | Freq: Two times a day (BID) | ORAL | Status: DC | PRN

## 2014-10-14 MED ORDER — FLUCONAZOLE 150 MG PO TABS: 150.0000 mg | ORAL_TABLET | Freq: Once | ORAL | Status: DC

## 2014-10-14 NOTE — Progress Notes (Signed)
 Chief Complaint:  Chief Complaint  Patient presents with  . Spasms    in the back area x3 days;   . Shoulder Pain    in the left shoulder blade area as well  . Back Pain    HPI: Angela Farrell is a 39 y.o. female who reports to [facility] today complaining of four-day history of left upper back pain which started about the same time she started doing many some exercises where she was doing some rowing motion. She has a prior history of back pain it feels similar but is more intense and has never hurt this much. It is sharp, 10 out of 10 pain. She's tried over-the-counter medication also older prescription of Robaxin without any relief.  She has not been able to sleep. She does have a history of meningitis in the past. She can't put her neck down because it stretches the back muscles and she hurts. However she denies any headaches, photophobia, nausea vomiting abdominal pain any dysuria any rashes. This does not feel like her meningitis.  Past Medical History  Diagnosis Date  . Anxiety   . Meningitis, viral At age 12 and at age 38  . Other and unspecified ovarian cysts 2011- pelvic CT    GYN- Dr. Chevis Pretty  . Anemia   . Depression   . Chronic headache disorder     s/p Meningitis x 2, tension- type plus allergic/sinus problems  . History of abnormal Pap smear   . Nasal fracture 2011  . Arthritis   . Allergy    Past Surgical History  Procedure Laterality Date  . Foot surgery  s/p 2 procedures  . Laparoscopy for ectopic pregnancy  1998    Fallopian tube removed  . Uterine artery embolization     History   Social History  . Marital Status: Single    Spouse Name: N/A  . Number of Children: N/A  . Years of Education: N/A   Occupational History  . GIS Analyst    Social History Main Topics  . Smoking status: Current Some Day Smoker  . Smokeless tobacco: Not on file  . Alcohol Use: Yes  . Drug Use: No  . Sexual Activity: Not on file   Other Topics Concern  . None    Social History Narrative   Family History  Problem Relation Age of Onset  . Migraines Father     cluster headache  . Hyperlipidemia Father   . Hypertension Father   . Cancer Mother   . Diabetes      paternal relaitve  . Seizures      maternal relative  . Rheum arthritis      maternal relatives  . Stroke Maternal Grandmother   . Diabetes Paternal Grandmother    No Known Allergies Prior to Admission medications   Medication Sig Start Date End Date Taking? Authorizing Provider  etonogestrel (NEXPLANON) 68 MG IMPL implant Inject 1 each into the skin once.   Yes Historical Provider, MD  HYDROcodone-acetaminophen (NORCO) 10-325 MG per tablet Take 1 tablet every 6-8 hours prn headache or pain. Patient not taking: Reported on 10/14/2014 06/15/14   Ethelda Chick, MD  HYDROcodone-homatropine [facility]) 5-1.5 MG/5ML syrup Take 5 mLs by mouth every 8 (eight) hours as needed for cough. Patient not taking: Reported on 10/14/2014 08/18/14   Pearline Cables, MD     ROS: The patient denies fevers, chills, night sweats, unintentional weight loss, chest pain, palpitations, wheezing, dyspnea on  exertion, nausea, vomiting, abdominal pain, dysuria, hematuria, melena, numbness, weakness, or tingling.   All other systems have been reviewed and were otherwise negative with the exception of those mentioned in the HPI and as above.    PHYSICAL EXAM: Filed Vitals:   10/14/14 0908  BP: 142/84  Pulse: 100  Temp: 97.8 F (36.6 C)  Resp: 18   Body mass index is 37.88 kg/(m^2).   General: Alert, no acute distress HEENT:  Normocephalic, atraumatic, oropharynx patent. EOMI, PERRLA, fundoscopic exam normal. TM normal.  Cardiovascular:  Regular rate and rhythm, no rubs murmurs or gallops.  No Carotid bruits, radial pulse intact. No pedal edema.  Respiratory: Clear to auscultation bilaterally.  No wheezes, rales, or rhonchi.  No cyanosis, no use of accessory musculature Abdominal: No organomegaly,  abdomen is soft and non-tender, positive bowel sounds. No masses. Skin: No rashes. Neurologic: Facial musculature symmetric. Psychiatric: Patient acts appropriately throughout our interaction. Lymphatic: No cervical or submandibular lymphadenopathy Musculoskeletal: Gait intact. No edema, tenderness Able to flex her neck but with some pain, tender along left upper thoracic   LABS: Results for orders placed or performed in visit on 10/14/14  POCT CBC  Result Value Ref Range   WBC 7.0 4.6 - 10.2 K/uL   Lymph, poc 1.6 0.6 - 3.4   POC LYMPH PERCENT 22.9 10 - 50 %L   MID (cbc) 0.2 0 - 0.9   POC MID % 2.2 0 - 12 %M   POC Granulocyte 5.2 2 - 6.9   Granulocyte percent 74.9 37 - 80 %G   RBC 4.36 4.04 - 5.48 M/uL   Hemoglobin 12.1 (A) 12.2 - 16.2 g/dL   HCT, POC 16.1 (A) 09.6 - 47.9 %   MCV 84.8 80 - 97 fL   MCH, POC 27.8 27 - 31.2 pg   MCHC 32.8 31.8 - 35.4 g/dL   RDW, POC 04.5 %   Platelet Count, POC 313.0 142 - 424 K/uL   MPV 7.9 0 - 99.8 fL  POCT UA - Microscopic Only  Result Value Ref Range   WBC, Ur, HPF, POC 0-3    RBC, urine, microscopic 2-4    Bacteria, U Microscopic Trace    Mucus, UA Negative    Epithelial cells, urine per micros 0-3    Crystals, Ur, HPF, POC Negative    Casts, Ur, LPF, POC Negative    Yeast, UA positive    Hyphae Yeast    POCT urinalysis dipstick  Result Value Ref Range   Color, UA Amber    Clarity, UA Turbid    Glucose, UA Negative    Bilirubin, UA Small    Ketones, UA Negative    Spec Grav, UA 1.015    Blood, UA Small    pH, UA 7.0    Protein, UA Negative    Urobilinogen, UA 0.2    Nitrite, UA Negative    Leukocytes, UA Negative Negative     EKG/XRAY:   Primary read interpreted by Dr. Conley Rolls at [facility]. No appreciable fractures or dislocation No appreciable obstruction, renal stones   ASSESSMENT/PLAN: Encounter Diagnoses  Name Primary?  Marland Kitchen Upper back pain Yes  . History of meningitis   . Left flank pain   . Candida UTI    She was  given a 60 mg injection of Toradol without relief, also alprazolam 0.25 mg 1 dose. She felt that her pain was the same. Rx percocet, zanaflex, prednisone, if no improvement then may use valium instead of zanaflex  Fu prn , they have an ortho appt tomorrow so will keep it prn  Precautions given for meningitis Rx Diflucan for yeast UTI    Gross sideeffects, risk and benefits, and alternatives of medications d/w patient. Patient is aware that all medications have potential sideeffects and we are unable to predict every sideeffect or drug-drug interaction that may occur.    DO  10/14/2014 12:51 PM

## 2014-10-14 NOTE — Telephone Encounter (Signed)
Spoke with patient regarding labs, she needs to stop tylenol, will give her rx for oxycodone without tylenol. She has been taking 650 mg tylenol 4-6 times a day for last 4 days. Need to recheck in 2 days CMP. Discontinue pecocet

## 2014-10-16 ENCOUNTER — Ambulatory Visit (INDEPENDENT_AMBULATORY_CARE_PROVIDER_SITE_OTHER): Payer: Commercial Managed Care - HMO | Admitting: Physician Assistant

## 2014-10-16 VITALS — BP 132/76 | HR 88 | Temp 98.3°F | Resp 18 | Ht 64.5 in | Wt 216.0 lb

## 2014-10-16 DIAGNOSIS — R7989 Other specified abnormal findings of blood chemistry: Secondary | ICD-10-CM | POA: Diagnosis not present

## 2014-10-16 DIAGNOSIS — R945 Abnormal results of liver function studies: Principal | ICD-10-CM

## 2014-10-16 LAB — POCT URINALYSIS DIPSTICK
Bilirubin, UA: NEGATIVE
Blood, UA: NEGATIVE
GLUCOSE UA: NEGATIVE
KETONES UA: NEGATIVE
LEUKOCYTES UA: NEGATIVE
Nitrite, UA: NEGATIVE
PROTEIN UA: NEGATIVE
Spec Grav, UA: 1.01
Urobilinogen, UA: 0.2
pH, UA: 7

## 2014-10-16 LAB — POCT CBC
Granulocyte percent: 73.2 %G (ref 37–80)
HEMATOCRIT: 36.3 % — AB (ref 37.7–47.9)
HEMOGLOBIN: 11.8 g/dL — AB (ref 12.2–16.2)
Lymph, poc: 1.7 (ref 0.6–3.4)
MCH: 27.8 pg (ref 27–31.2)
MCHC: 32.5 g/dL (ref 31.8–35.4)
MCV: 85.7 fL (ref 80–97)
MID (CBC): 0.2 (ref 0–0.9)
MPV: 7.7 fL (ref 0–99.8)
PLATELET COUNT, POC: 320 10*3/uL (ref 142–424)
POC GRANULOCYTE: 5.3 (ref 2–6.9)
POC LYMPH PERCENT: 23.6 %L (ref 10–50)
POC MID %: 3.2 % (ref 0–12)
RBC: 4.23 M/uL (ref 4.04–5.48)
RDW, POC: 13.8 %
WBC: 7.3 10*3/uL (ref 4.6–10.2)

## 2014-10-16 LAB — POCT UA - MICROSCOPIC ONLY
Casts, Ur, LPF, POC: NEGATIVE
Crystals, Ur, HPF, POC: NEGATIVE
MUCUS UA: NEGATIVE
RBC, urine, microscopic: NEGATIVE
YEAST UA: NEGATIVE

## 2014-10-16 NOTE — Progress Notes (Signed)
[name], female    DOB: [birth year], 39 y.o.   MRN: [id number]  PCP: Dow Adolph, MD  Subjective:   Chief Complaint  Patient presents with  . Back Pain    spasms, x 1 week    HPI Presents for evaluation of elevated LFTs noted during evaluation of LEFT back and chest wall spasms.  She was seen 7/13 for an episode of upper back pain and spasms, worse than she'd previously experienced. She had recently started a new rowing type exercise and thought that may have triggered the pain. Symptoms were unrelieved with OTC medications and some Robaxin she had left from a previous prescription. Pain was 10/10 and preventing her from sleeping.  Her medical history is somewhat complicated due to Mollaret's meningitis.  She was given a dose of Toradol IM and prescribed oxycodone, Zanaflex and prednisone. Yeast was noted in her urine, and was given Diflucan. CMET revealed markedly elevated LFTs.  She saw ortho this morning for evaluation and is scheduled to have an MRI of the T-spine next week. Valium added.  Since she had no improvement with the treatment prescribed on 7/13, she was asked to return today for repeat labs.    Review of Systems  Constitutional: Negative for fever, chills and diaphoresis.  Eyes: Negative for photophobia and visual disturbance.  Respiratory: Negative for cough, chest tightness, shortness of breath and wheezing.   Cardiovascular: Negative for chest pain, palpitations and leg swelling.  Gastrointestinal: Negative for nausea and vomiting.  Genitourinary: Negative for dysuria, urgency, frequency and hematuria.  Musculoskeletal: Positive for myalgias, back pain and neck pain.       The entire LEFT side of her chest and upper back are severely painful, and she has pain in the LEFT neck with flexion.  Skin: Negative for rash and wound.  Psychiatric/Behavioral: Positive for sleep disturbance (due to pain).       Patient Active Problem List   Diagnosis Date Noted  . Anxiety disorder 01/18/2012  . Chronic anemia 10/14/2011  . Viral meningitis 09/26/2011  . Chronic headache disorder 04/18/2011  . Obesity 04/18/2011  . Allergic rhinitis 04/18/2011    Class: Chronic     Prior to Admission medications   Medication Sig Start Date End Date Taking? Authorizing Provider  diazepam (VALIUM) 5 MG tablet Take 1 tablet (5 mg total) by mouth every 12 (twelve) hours as needed for anxiety or muscle spasms (only take if zanaflex does not work. Do not take with zanaflex, take valium or zanaflex but not both.). 10/14/14  Yes Thao P Le, DO  etonogestrel (NEXPLANON) 68 MG IMPL implant Inject 1 each into the skin once.   Yes Historical Provider, MD  GABAPENTIN PO Take by mouth.   Yes Historical Provider, MD  oxyCODONE (OXY IR/ROXICODONE) 5 MG immediate release tablet Take 1 tablet (5 mg total) by mouth every 6 (six) hours as needed for severe pain. 10/14/14  Yes Thao P Le, DO  predniSONE (DELTASONE) 20 MG tablet Take 1 tab po daily, last 2 days take 1/2 tab po daily for taper 10/14/14  Yes Thao P Le, DO     No Known Allergies     Objective:  Physical Exam  Constitutional: She is oriented to person, place, and time. She appears well-developed and well-nourished. No distress.  BP 132/76 mmHg  Pulse 88  Temp(Src) 98.3 F (36.8 C)  Resp 18  Ht 5' 4.5" (1.638 m)  Wt 216 lb (97.977 kg)  BMI 36.52 kg/m2  SpO2 98%   Eyes: Conjunctivae are normal. No scleral icterus.  Neck: No thyromegaly present.  Cardiovascular: Normal rate, regular rhythm, normal heart sounds and intact distal pulses.   Pulmonary/Chest: Effort normal and breath sounds normal.  Lymphadenopathy:    She has no cervical adenopathy.  Neurological: She is alert and oriented to person, place, and time.  Skin: Skin is warm and dry.  Psychiatric: She has a normal mood and affect. Her behavior is normal.       Results for orders placed or performed in visit on 10/16/14  POCT  CBC  Result Value Ref Range   WBC 7.3 4.6 - 10.2 K/uL   Lymph, poc 1.7 0.6 - 3.4   POC LYMPH PERCENT 23.6 10 - 50 %L   MID (cbc) 0.2 0 - 0.9   POC MID % 3.2 0 - 12 %M   POC Granulocyte 5.3 2 - 6.9   Granulocyte percent 73.2 37 - 80 %G   RBC 4.23 4.04 - 5.48 M/uL   Hemoglobin 11.8 (A) 12.2 - 16.2 g/dL   HCT, POC 16.136.3 (A) 09.637.7 - 47.9 %   MCV 85.7 80 - 97 fL   MCH, POC 27.8 27 - 31.2 pg   MCHC 32.5 31.8 - 35.4 g/dL   RDW, POC 04.513.8 %   Platelet Count, POC 320 142 - 424 K/uL   MPV 7.7 0 - 99.8 fL  POCT UA - Microscopic Only  Result Value Ref Range   WBC, Ur, HPF, POC 0-2    RBC, urine, microscopic neg    Bacteria, U Microscopic few    Mucus, UA neg    Epithelial cells, urine per micros 0-2    Crystals, Ur, HPF, POC neg    Casts, Ur, LPF, POC neg    Yeast, UA neg   POCT urinalysis dipstick  Result Value Ref Range   Color, UA yellow    Clarity, UA clear    Glucose, UA neg    Bilirubin, UA neg    Ketones, UA neg    Spec Grav, UA 1.010    Blood, UA neg    pH, UA 7.0    Protein, UA neg    Urobilinogen, UA 0.2    Nitrite, UA neg    Leukocytes, UA Negative Negative      Assessment & Plan:   1. Elevated LFTs CBC and UA with micro are normal today. Await LFTs. Continue oral hydration. OK to increase Valium dose to 7.5 mg Q 6 hours, then 10 mg if needed. Continue oxycodone and complete prednisone taper. To ED if she develops nausea, photophobia, neck stiffness, HA. - POCT CBC - Hepatic function panel - POCT UA - Microscopic Only - POCT urinalysis dipstick   Fernande Brashelle S. Johnanna Bakke, PA-C Physician Assistant-Certified [facility]

## 2014-10-16 NOTE — Patient Instructions (Addendum)
Increase the valium from 5 mg to 7.5 mg (1.5 tablets) then to 10 mg (2 tablets) if needed. Increase the frequency to every 6 hours as needed. Use the heating pad. Drink lots of water. Proceed with the MRI ordered by orthopedics.

## 2014-10-17 ENCOUNTER — Telehealth: Payer: Self-pay | Admitting: Physician Assistant

## 2014-10-17 LAB — HEPATIC FUNCTION PANEL
ALBUMIN: 4.2 g/dL (ref 3.5–5.2)
ALT: 99 U/L — AB (ref 0–35)
AST: 45 U/L — ABNORMAL HIGH (ref 0–37)
Alkaline Phosphatase: 51 U/L (ref 39–117)
Bilirubin, Direct: 0.1 mg/dL (ref 0.0–0.3)
Indirect Bilirubin: 0.8 mg/dL (ref 0.2–1.2)
Total Bilirubin: 0.9 mg/dL (ref 0.2–1.2)
Total Protein: 7 g/dL (ref 6.0–8.3)

## 2014-10-17 NOTE — Telephone Encounter (Signed)
Left message on VM that LFTs much improved. Proceed with current treatment and planned MRI.

## 2014-10-19 ENCOUNTER — Other Ambulatory Visit: Payer: Self-pay | Admitting: Family Medicine

## 2014-10-19 DIAGNOSIS — M546 Pain in thoracic spine: Secondary | ICD-10-CM

## 2014-10-20 ENCOUNTER — Other Ambulatory Visit: Payer: Self-pay | Admitting: Family Medicine

## 2014-10-20 DIAGNOSIS — M791 Myalgia, unspecified site: Secondary | ICD-10-CM

## 2014-10-20 MED ORDER — DIAZEPAM 10 MG PO TABS
ORAL_TABLET | ORAL | Status: DC
Start: 2014-10-20 — End: 2014-10-20

## 2014-10-20 MED ORDER — PREDNISONE 20 MG PO TABS: ORAL_TABLET | ORAL | Status: DC

## 2014-10-20 MED ORDER — OXYCODONE HCL 5 MG PO TABS: 5.0000 mg | ORAL_TABLET | Freq: Four times a day (QID) | ORAL | Status: DC | PRN

## 2014-10-20 MED ORDER — DIAZEPAM 10 MG PO TABS: ORAL_TABLET | ORAL | Status: DC

## 2014-10-22 ENCOUNTER — Ambulatory Visit
Admission: RE | Admit: 2014-10-22 | Discharge: 2014-10-22 | Disposition: A | Discharge status: Home / Self Care | Payer: 59 | Source: Ambulatory Visit | Attending: Family Medicine | Admitting: Family Medicine

## 2014-10-22 DIAGNOSIS — M546 Pain in thoracic spine: Secondary | ICD-10-CM

## 2014-10-25 ENCOUNTER — Other Ambulatory Visit: Payer: Self-pay | Admitting: Family Medicine

## 2014-10-25 DIAGNOSIS — R748 Abnormal levels of other serum enzymes: Secondary | ICD-10-CM

## 2014-10-27 ENCOUNTER — Telehealth: Payer: Self-pay

## 2014-10-27 NOTE — Telephone Encounter (Signed)
Pt would like a refill on metanx

## 2014-10-27 NOTE — Telephone Encounter (Signed)
Spoke with pt, she would like something that is equivalent to Metanx since it is so expensive.  l-methylfolate-B6-B12 (METANX) 3-35-2 MG TABS [[id number]] DISCONTINUED      Order Details    Dose: 1 tablet Route: Oral Frequency: 2 times daily   Dispense Quantity:  60 tablet Refills:  5 Fills Remaining:  5          Sig: Take 1 tablet by mouth 2 (two) times daily.   Patient not taking: Reported on 10/14/2014

## 2014-10-28 ENCOUNTER — Other Ambulatory Visit: Payer: Self-pay | Admitting: Family Medicine

## 2014-10-28 NOTE — Telephone Encounter (Signed)
Left message for pt to call back  °

## 2014-10-28 NOTE — Telephone Encounter (Signed)
Can you tell her to just take a super B complex tablet by [employer] daily, it should have all the things that are in her metanx ( folic acid, b 6 and b12 ). How is she doing with her back?

## 2014-10-28 NOTE — Telephone Encounter (Signed)
Spoke with pt, advised message from Dr. Conley Rolls. She states she is not doing better with her back. She is actually home from work today.

## 2014-10-30 NOTE — Telephone Encounter (Signed)
Spoke with patinet, she is doing ok, not great,still onlast of prednisone taper , taking valium, combo of valium, percocet, and zanaflex helps, going to PT; she would like to consider Lidoderm patch, if she can get sample from PT then will try it otherwise will call me back.

## 2014-11-02 ENCOUNTER — Encounter: Payer: Self-pay | Admitting: Family Medicine

## 2014-11-02 ENCOUNTER — Ambulatory Visit (INDEPENDENT_AMBULATORY_CARE_PROVIDER_SITE_OTHER): Payer: Commercial Managed Care - HMO | Admitting: Family Medicine

## 2014-11-02 VITALS — BP 138/80 | HR 98 | Temp 98.1°F | Resp 16 | Ht 65.0 in | Wt 231.2 lb

## 2014-11-02 DIAGNOSIS — M25552 Pain in left hip: Secondary | ICD-10-CM | POA: Diagnosis not present

## 2014-11-02 DIAGNOSIS — F413 Other mixed anxiety disorders: Secondary | ICD-10-CM | POA: Diagnosis not present

## 2014-11-02 DIAGNOSIS — M791 Myalgia, unspecified site: Secondary | ICD-10-CM

## 2014-11-02 DIAGNOSIS — F32A Depression, unspecified: Secondary | ICD-10-CM

## 2014-11-02 DIAGNOSIS — M546 Pain in thoracic spine: Secondary | ICD-10-CM | POA: Diagnosis not present

## 2014-11-02 DIAGNOSIS — M545 Low back pain: Secondary | ICD-10-CM | POA: Diagnosis not present

## 2014-11-02 DIAGNOSIS — M25551 Pain in right hip: Secondary | ICD-10-CM

## 2014-11-02 DIAGNOSIS — M792 Neuralgia and neuritis, unspecified: Secondary | ICD-10-CM | POA: Diagnosis not present

## 2014-11-02 DIAGNOSIS — F329 Major depressive disorder, single episode, unspecified: Secondary | ICD-10-CM

## 2014-11-02 DIAGNOSIS — M62830 Muscle spasm of back: Secondary | ICD-10-CM | POA: Diagnosis not present

## 2014-11-02 DIAGNOSIS — M549 Dorsalgia, unspecified: Secondary | ICD-10-CM

## 2014-11-02 DIAGNOSIS — Z8661 Personal history of infections of the central nervous system: Secondary | ICD-10-CM | POA: Diagnosis not present

## 2014-11-02 MED ORDER — OXYCODONE HCL 5 MG PO TABS: 5.0000 mg | ORAL_TABLET | Freq: Three times a day (TID) | ORAL | Status: DC | PRN

## 2014-11-02 MED ORDER — LIDOCAINE 5 % EX PTCH: 1.0000 | MEDICATED_PATCH | CUTANEOUS | Status: DC

## 2014-11-02 MED ORDER — DULOXETINE HCL 30 MG PO CPEP: 30.0000 mg | ORAL_CAPSULE | Freq: Every day | ORAL | Status: DC

## 2014-11-02 MED ORDER — LIDOCAINE 4 % EX PTCH: 1.0000 | MEDICATED_PATCH | Freq: Two times a day (BID) | CUTANEOUS | Status: DC | PRN

## 2014-11-02 NOTE — Progress Notes (Signed)
Chief Complaint:  Chief Complaint  Patient presents with  . Hip Pain    x 1 week  . Foot Swelling    x today    HPI: Angela Farrell is a 39 y.o. female who reports to [facility] today complaining of not doing better in her hips worse than before in last 1 week,left worse than right, but really in both, somewhat better in her thoracic area. She is having hip pain where she can hardly walk as much yesterday. Hard for her to get up.  She denies any numbness weakness or tingling. She states that the combination of the oxycodone, zanafelx , and valium helps. She does not think the gabapentin helps. She was getting better and then worse. She is on the last dose of the prednisone taper, this will be the third taper since she saw Dr Althea Charon about 5 weeks ago.  She has had some swelling in her legs bilaterally. She has tried PT, TENS, and also acupunture, she state the PT makes it worse sometimes and not better. She has never had back pain this long. She denies any rashes.  Thoracic spine MRI shows DJD She gets anxious about her back pain since it does not get better and then she spirals and thinks this may make it worse. She was previously rx celexa for depression but never took it. Denies CP, SOB, DOE There is no immediate  family history of  Rheumatoid arthritis , this was asked eventhough checked off in hx    Past Medical History  Diagnosis Date  . Anxiety   . Meningitis, viral At age 50 and at age 60  . Other and unspecified ovarian cysts 2011- pelvic CT    GYN- Dr. Chevis Pretty  . Anemia   . Depression   . Chronic headache disorder     s/p Meningitis x 2, tension- type plus allergic/sinus problems  . History of abnormal Pap smear   . Nasal fracture 2011  . Arthritis   . Allergy    Past Surgical History  Procedure Laterality Date  . Foot surgery  s/p 2 procedures  . Laparoscopy for ectopic pregnancy  1998    Fallopian tube removed  . Uterine artery embolization     History    Social History  . Marital Status: Married    Spouse Name: N/A  . Number of Children: N/A  . Years of Education: N/A   Occupational History  . GIS Analyst    Social History Main Topics  . Smoking status: Former Games developer  . Smokeless tobacco: Not on file  . Alcohol Use: 0.0 oz/week    0 Standard drinks or equivalent per week  . Drug Use: No  . Sexual Activity: Not on file   Other Topics Concern  . None   Social History Narrative   Family History  Problem Relation Age of Onset  . Migraines Father     cluster headache  . Hyperlipidemia Father   . Hypertension Father   . Cancer Mother   . Diabetes      paternal relaitve  . Seizures      maternal relative  . Rheum arthritis      maternal relatives  . Stroke Maternal Grandmother   . Diabetes Paternal Grandmother    No Known Allergies Prior to Admission medications   Medication Sig Start Date End Date Taking? Authorizing Provider  diazepam (VALIUM) 10 MG tablet 1/2-1 tab po q12 hrs prn 10/20/14  Yes  Thao P Le, DO  etonogestrel (NEXPLANON) 68 MG IMPL implant Inject 1 each into the skin once.   Yes Historical Provider, MD  oxyCODONE (OXY IR/ROXICODONE) 5 MG immediate release tablet Take 1 tablet (5 mg total) by mouth every 8 (eight) hours as needed for severe pain. 11/02/14  Yes Thao P Le, DO  DULoxetine (CYMBALTA) 30 MG capsule Take 1 capsule (30 mg total) by mouth daily. 11/02/14   Thao P Le, DO  lidocaine (LIDODERM) 5 % Place 1 patch onto the skin daily. Remove & Discard patch within 12 hours or as directed by MD 11/02/14   Thao P Le, DO     ROS: The patient denies fevers, chills, night sweats, unintentional weight loss, chest pain, palpitations, wheezing, dyspnea on exertion, nausea, vomiting, abdominal pain, dysuria, hematuria, melena, numbness, weakness, or tingling.   All other systems have been reviewed and were otherwise negative with the exception of those mentioned in the HPI and as above.    PHYSICAL EXAM: Filed  Vitals:   11/02/14 1538  BP: 138/80  Pulse: 98  Temp: 98.1 F (36.7 C)  Resp: 16   Body mass index is 38.47 kg/(m^2).   General: Alert, no acute distress HEENT:  Normocephalic, atraumatic, oropharynx patent. EOMI, PERRLA Cardiovascular:  Regular rate and rhythm, no rubs murmurs or gallops.  No Carotid bruits, radial pulse intact. Minimal bilateral pedal edema.  Respiratory: Clear to auscultation bilaterally.  No wheezes, rales, or rhonchi.  No cyanosis, no use of accessory musculature Abdominal: No organomegaly, abdomen is soft and non-tender, positive bowel sounds. No masses. Skin: No rashes. Neurologic: Facial musculature symmetric. Psychiatric: Patient acts appropriately throughout our interaction. Lymphatic: No cervical or submandibular lymphadenopathy Musculoskeletal: Gait minimally antalgic. No edema, tenderness + paramsk tenderness upper thoracic between scapular area and midline She has limited ROM upper back in AROM 5/5 strength, 2/2 DTRs No saddle anesthesia Straight leg negative She is able to bend when she is sitting Hip exam limited due to pain, she is able to move it but she states it hurts her to do all ROM, she can due full AROM but again states there is pain She has no arthropathies or nodules on her fingers indicative of any Rheumatoid Arthritis    LABS: Results for orders placed or performed in visit on 10/16/14  Hepatic function panel  Result Value Ref Range   Total Bilirubin 0.9 0.2 - 1.2 mg/dL   Bilirubin, Direct 0.1 0.0 - 0.3 mg/dL   Indirect Bilirubin 0.8 0.2 - 1.2 mg/dL   Alkaline Phosphatase 51 39 - 117 U/L   AST 45 (H) 0 - 37 U/L   ALT 99 (H) 0 - 35 U/L   Total Protein 7.0 6.0 - 8.3 g/dL   Albumin 4.2 3.5 - 5.2 g/dL  POCT CBC  Result Value Ref Range   WBC 7.3 4.6 - 10.2 K/uL   Lymph, poc 1.7 0.6 - 3.4   POC LYMPH PERCENT 23.6 10 - 50 %L   MID (cbc) 0.2 0 - 0.9   POC MID % 3.2 0 - 12 %M   POC Granulocyte 5.3 2 - 6.9   Granulocyte percent  73.2 37 - 80 %G   RBC 4.23 4.04 - 5.48 M/uL   Hemoglobin 11.8 (A) 12.2 - 16.2 g/dL   HCT, POC 16.1 (A) 09.6 - 47.9 %   MCV 85.7 80 - 97 fL   MCH, POC 27.8 27 - 31.2 pg   MCHC 32.5 31.8 - 35.4 g/dL  RDW, POC 13.8 %   Platelet Count, POC 320 142 - 424 K/uL   MPV 7.7 0 - 99.8 fL  POCT UA - Microscopic Only  Result Value Ref Range   WBC, Ur, HPF, POC 0-2    RBC, urine, microscopic neg    Bacteria, U Microscopic few    Mucus, UA neg    Epithelial cells, urine per micros 0-2    Crystals, Ur, HPF, POC neg    Casts, Ur, LPF, POC neg    Yeast, UA neg   POCT urinalysis dipstick  Result Value Ref Range   Color, UA yellow    Clarity, UA clear    Glucose, UA neg    Bilirubin, UA neg    Ketones, UA neg    Spec Grav, UA 1.010    Blood, UA neg    pH, UA 7.0    Protein, UA neg    Urobilinogen, UA 0.2    Nitrite, UA neg    Leukocytes, UA Negative Negative     EKG/XRAY:   Primary read interpreted by Dr. Conley Rolls at [facility].   ASSESSMENT/PLAN: Encounter Diagnoses  Name Primary?  Marland Kitchen Upper back pain Yes  . History of meningitis   . Back spasm   . Bilateral hip pain   . Low back pain without sciatica, unspecified back pain laterality   . Neuropathic pain   . Muscle pain   . Depression    39 y/o obese AA female with PMH of anxiety/depression, chronic back pain and also hip pain, viral meningitis who is here for acute on chronic hip pain exacerbaed by her recent upper back pain that is not completely resolved. The pain can be moderate to severe.  Lidoderm patch 5%, hx of viral herpes meningitis per patient Consider Cymbalta, I have given her a prescription for it, she needs to read SE profile, I think this will help  With depression/anxiety in the setting of pain. If she has fibromyalgia pain then may be helpful. She is to take the valium prn but the cymbalta will take time to work Refill oxycodone, I prefer her not to take tylenol so she will take this only as needed and will taper off  oxycodone Wean off valium and oxycodone as discussed, she will take oxycodone only as needed and is currently only taking it BID prn , she will refill her valium 5 mg and take it prn, cont with Zanaflex prn  Stop gabapentin Refer physical med and rehab If no improvement then refer to Rheumatology Elevate legs, see if her swelling decreases with more ambulation , elevation and being off prednisone Fu prn    Gross sideeffects, risk and benefits, and alternatives of medications d/w patient. Patient is aware that all medications have potential sideeffects and we are unable to predict every sideeffect or drug-drug interaction that may occur.  Thao Le DO  11/02/2014 5:19 PM

## 2014-11-02 NOTE — Patient Instructions (Signed)
Duloxetine delayed-release capsules What is this medicine? DULOXETINE (doo LOX e teen) is used to treat depression, anxiety, and different types of chronic pain. This medicine may be used for other purposes; ask your health care provider or pharmacist if you have questions. COMMON BRAND NAME(S): Cymbalta What should I tell my health care provider before I take this medicine? They need to know if you have any of these conditions: -bipolar disorder or a family history of bipolar disorder -glaucoma -kidney disease -liver disease -suicidal thoughts or a previous suicide attempt -taken medicines called MAOIs like Carbex, Eldepryl, Marplan, Nardil, and Parnate within 14 days -an unusual reaction to duloxetine, other medicines, foods, dyes, or preservatives -pregnant or trying to get pregnant -breast-feeding How should I use this medicine? Take this medicine by mouth with a glass of water. Follow the directions on the prescription label. Do not cut, crush or chew this medicine. You can take this medicine with or without food. Take your medicine at regular intervals. Do not take your medicine more often than directed. Do not stop taking this medicine suddenly except upon the advice of your doctor. Stopping this medicine too quickly may cause serious side effects or your condition may worsen. A special MedGuide will be given to you by the pharmacist with each prescription and refill. Be sure to read this information carefully each time. Talk to your pediatrician regarding the use of this medicine in children. While this drug may be prescribed for children as young as 7 years of age for selected conditions, precautions do apply. Overdosage: If you think you have taken too much of this medicine contact a poison control center or emergency room at once. NOTE: This medicine is only for you. Do not share this medicine with others. What if I miss a dose? If you miss a dose, take it as soon as you can. If it  is almost time for your next dose, take only that dose. Do not take double or extra doses. What may interact with this medicine? Do not take this medicine with any of the following medications: -certain diet drugs like dexfenfluramine, fenfluramine -desvenlafaxine -linezolid -MAOIs like Azilect, Carbex, Eldepryl, Marplan, Nardil, and Parnate -methylene blue (intravenous) -milnacipran -thioridazine -venlafaxine This medicine may also interact with the following medications: -alcohol -aspirin and aspirin-like medicines -certain antibiotics like ciprofloxacin and enoxacin -certain medicines for blood pressure, heart disease, irregular heart beat -certain medicines for depression, anxiety, or psychotic disturbances -certain medicines for migraine headache like almotriptan, eletriptan, frovatriptan, naratriptan, rizatriptan, sumatriptan, zolmitriptan -certain medicines that treat or prevent blood clots like warfarin, enoxaparin, and dalteparin -cimetidine -fentanyl -lithium -NSAIDS, medicines for pain and inflammation, like ibuprofen or naproxen -phentermine -procarbazine -sibutramine -St. John's wort -theophylline -tramadol -tryptophan This list may not describe all possible interactions. Give your health care provider a list of all the medicines, herbs, non-prescription drugs, or dietary supplements you use. Also tell them if you smoke, drink alcohol, or use illegal drugs. Some items may interact with your medicine. What should I watch for while using this medicine? Tell your doctor if your symptoms do not get better or if they get worse. Visit your doctor or health care professional for regular checks on your progress. Because it may take several weeks to see the full effects of this medicine, it is important to continue your treatment as prescribed by your doctor. Patients and their families should watch out for new or worsening thoughts of suicide or depression. Also watch out for  sudden changes in   feelings such as feeling anxious, agitated, panicky, irritable, hostile, aggressive, impulsive, severely restless, overly excited and hyperactive, or not being able to sleep. If this happens, especially at the beginning of treatment or after a change in dose, call your health care professional. You may get drowsy or dizzy. Do not drive, use machinery, or do anything that needs mental alertness until you know how this medicine affects you. Do not stand or sit up quickly, especially if you are an older patient. This reduces the risk of dizzy or fainting spells. Alcohol may interfere with the effect of this medicine. Avoid alcoholic drinks. This medicine can cause an increase in blood pressure. This medicine can also cause a sudden drop in your blood pressure, which may make you feel faint and increase the chance of a fall. These effects are most common when you first start the medicine or when the dose is increased, or during use of other medicines that can cause a sudden drop in blood pressure. Check with your doctor for instructions on monitoring your blood pressure while taking this medicine. Your mouth may get dry. Chewing sugarless gum or sucking hard candy, and drinking plenty of water may help. Contact your doctor if the problem does not go away or is severe. What side effects may I notice from receiving this medicine? Side effects that you should report to your doctor or health care professional as soon as possible: -allergic reactions like skin rash, itching or hives, swelling of the face, lips, or tongue -changes in blood pressure -confusion -dark urine -dizziness -fast talking and excited feelings or actions that are out of control -fast, irregular heartbeat -fever -general ill feeling or flu-like symptoms -hallucination, loss of contact with reality -light-colored stools -loss of balance or coordination -redness, blistering, peeling or loosening of the skin, including  inside the mouth -right upper belly pain -seizures -suicidal thoughts or other mood changes -trouble concentrating -trouble passing urine or change in the amount of urine -unusual bleeding or bruising -unusually weak or tired -yellowing of the eyes or skin Side effects that usually do not require medical attention (report to your doctor or health care professional if they continue or are bothersome): -blurred vision -change in appetite -change in sex drive or performance -headache -increased sweating -nausea This list may not describe all possible side effects. Call your doctor for medical advice about side effects. You may report side effects to FDA at 1-800-FDA-1088. Where should I keep my medicine? Keep out of the reach of children. Store at room temperature between 15 and 30 degrees C (59 and 86 degrees F). Throw away any unused medicine after the expiration date. NOTE: This sheet is a summary. It may not cover all possible information. If you have questions about this medicine, talk to your doctor, pharmacist, or health care provider.  2015, [employer]vier/[employer] Standard. (2013-03-11 14:20:31)  

## 2014-11-03 ENCOUNTER — Other Ambulatory Visit: Payer: Self-pay | Admitting: Family Medicine

## 2014-11-03 DIAGNOSIS — M25552 Pain in left hip: Secondary | ICD-10-CM

## 2014-11-03 DIAGNOSIS — Z8661 Personal history of infections of the central nervous system: Secondary | ICD-10-CM | POA: Insufficient documentation

## 2014-11-03 DIAGNOSIS — M25551 Pain in right hip: Secondary | ICD-10-CM | POA: Insufficient documentation

## 2014-11-03 DIAGNOSIS — F32A Depression, unspecified: Secondary | ICD-10-CM | POA: Insufficient documentation

## 2014-11-03 DIAGNOSIS — F329 Major depressive disorder, single episode, unspecified: Secondary | ICD-10-CM | POA: Insufficient documentation

## 2014-11-04 ENCOUNTER — Other Ambulatory Visit: Payer: Self-pay

## 2014-11-04 MED ORDER — LIDOCAINE 5 % EX PTCH: 1.0000 | MEDICATED_PATCH | CUTANEOUS | Status: DC

## 2014-11-04 NOTE — Telephone Encounter (Signed)
Pharm faxed notice that patches only come in 5%, not in 4% that was on their fax of Rx, BUT THE  RX SENT IN BY DR LE WAS FOR THE 5% TO BEGIN WITH.  Resent Rx for the 5% lidocaine patches.

## 2014-11-05 ENCOUNTER — Telehealth: Payer: Self-pay | Admitting: Family Medicine

## 2014-11-05 ENCOUNTER — Other Ambulatory Visit: Payer: Self-pay | Admitting: Family Medicine

## 2014-11-05 DIAGNOSIS — R945 Abnormal results of liver function studies: Principal | ICD-10-CM

## 2014-11-05 DIAGNOSIS — R7989 Other specified abnormal findings of blood chemistry: Secondary | ICD-10-CM

## 2014-11-05 NOTE — Telephone Encounter (Signed)
Spoke with Velna Hatchet and Jesusita Oka last night so this is a late entry. She was having to take some extra hydrocodone for HAs and back pain  since decreasing her valium and also taking less oxycodone. We had put her on oxycodone because she was taking too much tylenol and liver enzymes were elevated. She took the cymbalta and had vomitted. She is stillhaving back and hip pain. I told them it would be better for her to taper down on the oxycodone and keep the valium at 10 mg TID prn  Since she did get some releif with that. She can also add the zanaflex and space it out. I have referred her to pain management but that may take 4 weeks or more and she has ben referred to rheumatology based on my conversation with Dr Althea Charon. We will fast forward the process. I have added a CMP to the blood work thatw as drawn to make sure her liver enzymes are back to normal and then will consider changing oxycodone to norco as we step down her narcotic use. LM to call me back and see how she is doing today.

## 2014-11-06 ENCOUNTER — Telehealth: Payer: Self-pay | Admitting: Family Medicine

## 2014-11-06 LAB — COMPREHENSIVE METABOLIC PANEL
ALK PHOS: 42 U/L (ref 33–115)
ALT: 23 U/L (ref 6–29)
AST: 19 U/L (ref 10–30)
Albumin: 4 g/dL (ref 3.6–5.1)
BILIRUBIN TOTAL: 0.9 mg/dL (ref 0.2–1.2)
BUN: 5 mg/dL — ABNORMAL LOW (ref 7–25)
CALCIUM: 8.9 mg/dL (ref 8.6–10.2)
CO2: 26 mmol/L (ref 20–31)
CREATININE: 0.63 mg/dL (ref 0.50–1.10)
Chloride: 102 mmol/L (ref 98–110)
Glucose, Bld: 83 mg/dL (ref 65–99)
Potassium: 4.5 mmol/L (ref 3.5–5.3)
Sodium: 136 mmol/L (ref 135–146)
TOTAL PROTEIN: 6.4 g/dL (ref 6.1–8.1)

## 2014-11-06 NOTE — Telephone Encounter (Signed)
Liver enzymes completely back to normal. I spoke with Freda Munro and since her back pain is improved and most her pain is in the hips bialterally , she is ok with transitioning to norco now. Advise not to take extra tylenol with this. She will stop oxycodone. She is taking owen doses of valium 5 mg prn, she is also taking cymbalta. She is tolerating the cymbalta much better , will cont with this. She has been referred to Rheumatology and to pain managmeent. At this time will taper her down on pain meds in strength and also frequency. SHe is agreeable to do this until she sees pain management and rheumatology, nl ck, abnormal CRP, nl ESR.

## 2014-11-07 ENCOUNTER — Other Ambulatory Visit: Payer: Self-pay | Admitting: Family Medicine

## 2014-11-07 MED ORDER — HYDROCODONE-ACETAMINOPHEN 10-325 MG PO TABS: 1.0000 | ORAL_TABLET | Freq: Three times a day (TID) | ORAL | Status: DC | PRN

## 2014-11-12 ENCOUNTER — Other Ambulatory Visit: Payer: Self-pay | Admitting: Family Medicine

## 2014-11-12 DIAGNOSIS — M545 Low back pain, unspecified: Secondary | ICD-10-CM

## 2014-11-12 DIAGNOSIS — M25559 Pain in unspecified hip: Secondary | ICD-10-CM

## 2014-11-13 ENCOUNTER — Telehealth: Payer: Self-pay

## 2014-11-13 NOTE — Telephone Encounter (Signed)
Requires clinical review per [facility] case # [id number] with Physician to physician. Please contact [facility] at [phone] option #3. [facility] ID #[id number] Group# [id number] CPT: Code 24401 Diagnosis codes: M54.5, M25.559

## 2014-11-13 NOTE — Telephone Encounter (Signed)
Her authorization number is [id number] Expiration 12/28/2014

## 2014-11-27 ENCOUNTER — Ambulatory Visit
Admission: RE | Admit: 2014-11-27 | Discharge: 2014-11-27 | Disposition: A | Discharge status: Home / Self Care | Payer: Commercial Managed Care - HMO | Source: Ambulatory Visit | Attending: Family Medicine | Admitting: Family Medicine

## 2014-11-27 DIAGNOSIS — M545 Low back pain, unspecified: Secondary | ICD-10-CM

## 2014-11-27 DIAGNOSIS — M25559 Pain in unspecified hip: Secondary | ICD-10-CM

## 2014-12-01 ENCOUNTER — Other Ambulatory Visit: Payer: Self-pay | Admitting: Family Medicine

## 2014-12-01 DIAGNOSIS — M25552 Pain in left hip: Secondary | ICD-10-CM

## 2014-12-01 DIAGNOSIS — M549 Dorsalgia, unspecified: Secondary | ICD-10-CM

## 2014-12-04 ENCOUNTER — Telehealth: Payer: Self-pay | Admitting: Family Medicine

## 2014-12-04 DIAGNOSIS — T300 Burn of unspecified body region, unspecified degree: Secondary | ICD-10-CM

## 2014-12-04 MED ORDER — SILVER SULFADIAZINE 1 % EX CREA
1.0000 | TOPICAL_CREAM | Freq: Two times a day (BID) | CUTANEOUS | Status: DC
Start: 2014-12-04 — End: 2015-01-07

## 2014-12-04 NOTE — Telephone Encounter (Signed)
LM about referral to spine ortho, she has read the mychart release of  Her L spine MRI, I will let ortho determine if pain is stemming from low back disc herniations and stenosis or if truly in hip.

## 2014-12-04 NOTE — Telephone Encounter (Signed)
Slight burn from heating pad, would like something for it. NO pus or fever or chills.

## 2014-12-16 ENCOUNTER — Ambulatory Visit (INDEPENDENT_AMBULATORY_CARE_PROVIDER_SITE_OTHER): Payer: Commercial Managed Care - HMO | Admitting: Family Medicine

## 2014-12-16 ENCOUNTER — Encounter: Payer: Self-pay | Admitting: Family Medicine

## 2014-12-16 VITALS — BP 125/95 | HR 90 | Temp 98.3°F | Resp 16 | Ht 63.75 in | Wt 208.4 lb

## 2014-12-16 DIAGNOSIS — G4700 Insomnia, unspecified: Secondary | ICD-10-CM

## 2014-12-16 DIAGNOSIS — Z130 Encounter for screening for diseases of the blood and blood-forming organs and certain disorders involving the immune mechanism: Secondary | ICD-10-CM

## 2014-12-16 DIAGNOSIS — R519 Headache, unspecified: Secondary | ICD-10-CM

## 2014-12-16 DIAGNOSIS — Z13220 Encounter for screening for lipoid disorders: Secondary | ICD-10-CM | POA: Diagnosis not present

## 2014-12-16 DIAGNOSIS — R51 Headache: Secondary | ICD-10-CM | POA: Diagnosis not present

## 2014-12-16 DIAGNOSIS — R740 Nonspecific elevation of levels of transaminase and lactic acid dehydrogenase [LDH]: Secondary | ICD-10-CM | POA: Diagnosis not present

## 2014-12-16 DIAGNOSIS — T300 Burn of unspecified body region, unspecified degree: Secondary | ICD-10-CM

## 2014-12-16 DIAGNOSIS — Z23 Encounter for immunization: Secondary | ICD-10-CM | POA: Diagnosis not present

## 2014-12-16 DIAGNOSIS — Z131 Encounter for screening for diabetes mellitus: Secondary | ICD-10-CM | POA: Diagnosis not present

## 2014-12-16 DIAGNOSIS — Z1329 Encounter for screening for other suspected endocrine disorder: Secondary | ICD-10-CM | POA: Diagnosis not present

## 2014-12-16 DIAGNOSIS — J302 Other seasonal allergic rhinitis: Secondary | ICD-10-CM

## 2014-12-16 DIAGNOSIS — G8929 Other chronic pain: Secondary | ICD-10-CM

## 2014-12-16 DIAGNOSIS — Z0000 Encounter for general adult medical examination without abnormal findings: Secondary | ICD-10-CM

## 2014-12-16 DIAGNOSIS — R7401 Elevation of levels of liver transaminase levels: Secondary | ICD-10-CM

## 2014-12-16 LAB — HEPATIC FUNCTION PANEL
ALBUMIN: 4.3 g/dL (ref 3.6–5.1)
ALK PHOS: 39 U/L (ref 33–115)
ALT: 22 U/L (ref 6–29)
AST: 22 U/L (ref 10–30)
Bilirubin, Direct: 0.2 mg/dL (ref <=0.2)
Indirect Bilirubin: 0.7 mg/dL (ref 0.2–1.2)
TOTAL PROTEIN: 7 g/dL (ref 6.1–8.1)
Total Bilirubin: 0.9 mg/dL (ref 0.2–1.2)

## 2014-12-16 LAB — LIPID PANEL
CHOL/HDL RATIO: 3.7 ratio (ref <=5.0)
CHOLESTEROL: 175 mg/dL (ref 125–200)
HDL: 47 mg/dL (ref >=46)
LDL Cholesterol: 101 mg/dL (ref <130)
TRIGLYCERIDES: 136 mg/dL (ref <150)
VLDL: 27 mg/dL (ref <30)

## 2014-12-16 LAB — CBC
HCT: 36.5 % (ref 36.0–46.0)
Hemoglobin: 11.8 g/dL — ABNORMAL LOW (ref 12.0–15.0)
MCH: 28.1 pg (ref 26.0–34.0)
MCHC: 32.3 g/dL (ref 30.0–36.0)
MCV: 86.9 fL (ref 78.0–100.0)
MPV: 10.5 fL (ref 8.6–12.4)
PLATELETS: 328 10*3/uL (ref 150–400)
RBC: 4.2 MIL/uL (ref 3.87–5.11)
RDW: 13.4 % (ref 11.5–15.5)
WBC: 6.6 10*3/uL (ref 4.0–10.5)

## 2014-12-16 LAB — TSH: TSH: 1.306 u[IU]/mL (ref 0.350–4.500)

## 2014-12-16 LAB — HEMOGLOBIN A1C
Hgb A1c MFr Bld: 5.3 % (ref <5.7)
Mean Plasma Glucose: 105 mg/dL (ref <117)

## 2014-12-16 MED ORDER — HYDROCODONE-ACETAMINOPHEN 10-325 MG PO TABS: 1.0000 | ORAL_TABLET | Freq: Three times a day (TID) | ORAL | Status: DC | PRN

## 2014-12-16 MED ORDER — RAMELTEON 8 MG PO TABS: 8.0000 mg | ORAL_TABLET | Freq: Every day | ORAL | Status: DC

## 2014-12-16 MED ORDER — FLUTICASONE PROPIONATE 50 MCG/ACT NA SUSP: 2.0000 | Freq: Every day | NASAL | Status: DC

## 2014-12-16 NOTE — Patient Instructions (Addendum)
It was great to see you today!  I will be in touch with your labs Please be sure to get your flu shot this fall  Try the rozerem at bedtime for sleep- remember this will make you sleepy Consider doing the steroid injections for your hip- these may help you You can use the hydrocodone as needed;  however remember this is a habit forming medication and is a narcotic.  Use it sparingly   I will refer you to the headache specialist to see if they may be able to help you  Td Vaccine (Tetanus and Diphtheria): What You Need to Know 1. Why get vaccinated? Tetanus  and diphtheria are very serious diseases. They are rare in the [place] today, but people who do become infected often have severe complications. Td vaccine is used to protect adolescents and adults from both of these diseases. Both tetanus and diphtheria are infections caused by bacteria. Diphtheria spreads from person to person through coughing or sneezing. Tetanus-causing bacteria enter the body through cuts, scratches, or wounds. TETANUS (Lockjaw) causes painful muscle tightening and stiffness, usually all over the body.  It can lead to tightening of muscles in the head and neck so you can't open your mouth, swallow, or sometimes even breathe. Tetanus kills about 1 out of every 5 people who are infected. DIPHTHERIA can cause a thick coating to form in the back of the throat.  It can lead to breathing problems, paralysis, heart failure, and death. Before vaccines, the [place] saw as many as 200,000 cases a year of diphtheria and hundreds of cases of tetanus. Since vaccination began, cases of both diseases have dropped by about 99%. 2. Td vaccine Td vaccine can protect adolescents and adults from tetanus and diphtheria. Td is usually given as a booster dose every 10 years but it can also be given earlier after a severe and dirty wound or burn. Your doctor can give you more information. Td may safely be given at the same time  as other vaccines. 3. Some people should not get this vaccine  If you ever had a life-threatening allergic reaction after a dose of any tetanus or diphtheria containing vaccine, OR if you have a severe allergy to any part of this vaccine, you should not get Td. Tell your doctor if you have any severe allergies.  Talk to your doctor if you:  have epilepsy or another nervous system problem,  had severe pain or swelling after any vaccine containing diphtheria or tetanus,  ever had Guillain Barr Syndrome (GBS),  aren't feeling well on the day the shot is scheduled. 4. Risks of a vaccine reaction With a vaccine, like any medicine, there is a chance of side effects. These are usually mild and go away on their own. Serious side effects are also possible, but are very rare. Most people who get Td vaccine do not have any problems with it. Mild Problems  following Td (Did not interfere with activities)  Pain where the shot was given (about 8 people in 10)  Redness or swelling where the shot was given (about 1 person in 3)  Mild fever (about 1 person in 15)  Headache or Tiredness (uncommon) Moderate Problems following Td (Interfered with activities, but did not require medical attention)  Fever over 102F (rare) Severe Problems  following Td (Unable to perform usual activities; required medical attention)  Swelling, severe pain, bleeding and/or redness in the arm where the shot was given (rare). Problems that could happen  after any vaccine:  Brief fainting spells can happen after any medical procedure, including vaccination. Sitting or lying down for about 15 minutes can help prevent fainting, and injuries caused by a fall. Tell your doctor if you feel dizzy, or have vision changes or ringing in the ears.  Severe shoulder pain and reduced range of motion in the arm where a shot was given can happen, very rarely, after a vaccination.  Severe allergic reactions from a vaccine are very  rare, estimated at less than 1 in a million doses. If one were to occur, it would usually be within a few minutes to a few hours after the vaccination. 5. What if there is a serious reaction? What should I look for?  Look for anything that concerns you, such as signs of a severe allergic reaction, very high fever, or behavior changes. Signs of a severe allergic reaction can include hives, swelling of the face and throat, difficulty breathing, a fast heartbeat, dizziness, and weakness. These would usually start a few minutes to a few hours after the vaccination. What should I do?  If you think it is a severe allergic reaction or other emergency that can't wait, call 9-1-1 or get the person to the nearest hospital. Otherwise, call your doctor.  Afterward, the reaction should be reported to the Vaccine Adverse Event Reporting System (VAERS). Your doctor might file this report, or you can do it yourself through the VAERS web site at www.vaers.LAgents.no, or by calling 1-907-691-0215. VAERS is only for reporting reactions. They do not give medical advice. 6. The National Vaccine Injury Compensation Program The [employer] Vaccine Injury Compensation Program (VICP) is a federal program that was created to compensate people who may have been injured by certain vaccines. Persons who believe they may have been injured by a vaccine can learn about the program and about filing a claim by calling 1-(848)542-3539 or visiting the VICP website at SpiritualWord.at. 7. How can I learn more?  Ask your doctor.  Contact your local or state health department.  Contact the Centers for Disease Control and Prevention (CDC):  Call 602 650 2670 (1-800-CDC-INFO)  Visit CDC's website at PicCapture.uy CDC Td Vaccine Interim VIS (05/07/12) Document Released: 01/15/2006 Document Revised: 08/04/2013 Document Reviewed: 07/02/2013 [facility] Patient Information 2015 [place]. This information is not  intended to replace advice given to you by your health care provider. Make sure you discuss any questions you have with your health care provider.

## 2014-12-16 NOTE — Progress Notes (Signed)
[facility] [address], [place] [address] [phone]  Date:  12/16/2014   Name:  Angela Farrell   DOB:  20-Dec-1975   MRN:  [id number]  PCP:  Dow Adolph, MD    Chief Complaint: Annual Exam   History of Present Illness:  Angela Farrell is a 39 y.o. very pleasant female patient who presents with the following:  Here today for a PE and to establish care, although I actually saw her for a sick visit in May.    She also had elevated FLTs over the summer but this is now resolved. This was due to taking too much tylenol by mistake.  CMP in August- looked good She is still taking some tylenol with hydrocodone for headaches  She is fasting today for labs  She gets her flu shots at work- defer today  Her pap and breast exams are done by her OBG- she has nexplanon in place  She has some chronic AR sx- she uses zyrtec OTC, however this does not seem to help. She still has a lot of PND and burning in her throat.  She has noted some dificulty with her concentration, and she has trouble sleeping. She tends to wake up a lot, and has a hard time getting to sleep.  She thinks that she may snore.  She does take cymbalta- this controls her mood pretty well. Her anxiety has been better recently  She is taking hydrocodone for her hip pain and also for her headaches. She has a history of chronic headaches due to history of meningitis and takes this as needed. She has severe HA maybe once a month; however she will have a more typical headache maybe once a week.   She saw Dumonski 2 days ago- he rx her flexeril to use as needed for her hip pain. They are doing PT and home exercise. They discussed steroid injections but she was not sure about doing this- she is a little afraid of theses  She also burned her left buttock on a heating pad a couple of weeks ago- this is healing.  Her tetanus needs a booster as it was nearly 10 years ago now- tdap in 2007  BP Readings from  Last 3 Encounters:  12/16/14 144/95  11/02/14 138/80  10/16/14 132/76   Pulse Readings from Last 3 Encounters:  12/16/14 44  11/02/14 98  10/16/14 88     Patient Active Problem List   Diagnosis Date Noted  . History of meningitis 11/03/2014  . Bilateral hip pain 11/03/2014  . Depression 11/03/2014  . Anxiety disorder 01/18/2012  . Chronic anemia 10/14/2011  . Viral meningitis 09/26/2011  . Chronic headache disorder 04/18/2011  . Obesity 04/18/2011  . Allergic rhinitis 04/18/2011    Class: Chronic    Past Medical History  Diagnosis Date  . Anxiety   . Meningitis, viral At age 21 and at age 74  . Other and unspecified ovarian cysts 2011- pelvic CT    GYN- Dr. Chevis Pretty  . Anemia   . Depression   . Chronic headache disorder     s/p Meningitis x 2, tension- type plus allergic/sinus problems  . History of abnormal Pap smear   . Nasal fracture 2011  . Arthritis   . Allergy     Past Surgical History  Procedure Laterality Date  . Foot surgery  s/p 2 procedures  . Laparoscopy for ectopic pregnancy  1998    Fallopian tube removed  .  Uterine artery embolization      Social History  Substance Use Topics  . Smoking status: Former Games developer  . Smokeless tobacco: None  . Alcohol Use: 0.0 oz/week    0 Standard drinks or equivalent per week    Family History  Problem Relation Age of Onset  . Migraines Father     cluster headache  . Hyperlipidemia Father   . Hypertension Father   . Cancer Mother   . Diabetes      paternal relaitve  . Seizures      maternal relative  . Rheum arthritis      maternal relatives  . Stroke Maternal Grandmother   . Diabetes Paternal Grandmother     No Known Allergies  Medication list has been reviewed and updated.  Current Outpatient Prescriptions on File Prior to Visit  Medication Sig Dispense Refill  . DULoxetine (CYMBALTA) 30 MG capsule Take 1 capsule (30 mg total) by mouth daily. 30 capsule 3  . etonogestrel (NEXPLANON) 68 MG  IMPL implant Inject 1 each into the skin once.    Marland Kitchen HYDROcodone-acetaminophen (NORCO) 10-325 MG per tablet Take 1 tablet by mouth every 8 (eight) hours as needed for severe pain (do not use extra tylenol with this, take stool softener). 30 tablet 0  . lidocaine (LIDODERM) 5 % Place 1 patch onto the skin daily. Remove & Discard patch within 12 hours or as directed by MD 30 patch 1  . silver sulfADIAZINE (SILVADENE) 1 % cream Apply 1 application topically 2 (two) times daily. With nonadhesive dressing. 25 g 0   No current facility-administered medications on file prior to visit.    Review of Systems:  As per HPI- otherwise negative.   Physical Examination: Filed Vitals:   12/16/14 0859  BP: 144/95  Pulse: 44  Temp: 98.3 F (36.8 C)  Resp: 16   Filed Vitals:   12/16/14 0859  Height: 5' 3.75" (1.619 m)  Weight: 208 lb 6.4 oz (94.53 kg)   Body mass index is 36.06 kg/(m^2). Ideal Body Weight: Weight in (lb) to have BMI = 25: 144.2  GEN: WDWN, NAD, Non-toxic, A & O x 3, obese, looks well HEENT: Atraumatic, Normocephalic. Neck supple. No masses, No LAD.  Bilateral TM wnl, oropharynx normal.  PEERL,EOMI.   Ears and Nose: No external deformity. CV: RRR, No M/G/R. No JVD. No thrill. No extra heart sounds. PULM: CTA B, no wheezes, crackles, rhonchi. No retractions. No resp. distress. No accessory muscle use. ABD: S, NT, ND. No rebound. No HSM. EXTR: No c/c/e NEURO Normal gait.  PSYCH: Normally interactive. Conversant. Not depressed or anxious appearing.  Calm demeanor.  2nd/ mild 3rd degree burn on left buttock- this is healing well  Most recent controlled substances on her list are oxycodone 8/1 and vicodin 8/6, both from our Dr. Conley Rolls Assessment and Plan: Physical exam  Screening for hyperlipidemia - Plan: Lipid panel  Screening for hypothyroidism - Plan: TSH  Screening for diabetes mellitus - Plan: Hemoglobin A1c  Screening for deficiency anemia - Plan: CBC  Other seasonal  allergic rhinitis - Plan: fluticasone (FLONASE) 50 MCG/ACT nasal spray  Insomnia - Plan: ramelteon (ROZEREM) 8 MG tablet  Chronic nonintractable headache, unspecified headache type - Plan: HYDROcodone-acetaminophen (NORCO) 10-325 MG per tablet, AMB referral to headache clinic  Transaminitis - Plan: Hepatic Function Panel  Burn - Plan: Td vaccine greater than or equal to 7yo preservative free IM   Will check her LFTs today Other labs pending Try some flonase  for her AR as she is only using oral antihistamines Try rozerem for sleep- she has some success with OTC melatonin so this may also be helpful Refilled her norco but advised her that this can be habit forming, will refer to [facility] tdap due to burn   Fill out form for her insurance company with labs  Signed Abbe Amsterdam, MD

## 2014-12-25 ENCOUNTER — Ambulatory Visit (INDEPENDENT_AMBULATORY_CARE_PROVIDER_SITE_OTHER): Payer: Commercial Managed Care - HMO | Admitting: Diagnostic Neuroimaging

## 2014-12-25 ENCOUNTER — Encounter: Payer: Self-pay | Admitting: Diagnostic Neuroimaging

## 2014-12-25 VITALS — BP 154/103 | HR 87 | Ht 63.0 in | Wt 213.0 lb

## 2014-12-25 DIAGNOSIS — R519 Headache, unspecified: Secondary | ICD-10-CM

## 2014-12-25 DIAGNOSIS — G4719 Other hypersomnia: Secondary | ICD-10-CM

## 2014-12-25 DIAGNOSIS — G032 Benign recurrent meningitis [Mollaret]: Secondary | ICD-10-CM | POA: Diagnosis not present

## 2014-12-25 DIAGNOSIS — R0683 Snoring: Secondary | ICD-10-CM

## 2014-12-25 DIAGNOSIS — R51 Headache: Secondary | ICD-10-CM

## 2014-12-25 MED ORDER — RIZATRIPTAN BENZOATE 10 MG PO TBDP: 10.0000 mg | ORAL_TABLET | ORAL | Status: DC | PRN

## 2014-12-25 MED ORDER — TOPIRAMATE 50 MG PO TABS: 50.0000 mg | ORAL_TABLET | Freq: Two times a day (BID) | ORAL | Status: DC

## 2014-12-25 NOTE — Progress Notes (Signed)
[facility]  PATIENT: Angela Farrell DOB: [birth year]  REFERRING CLINICIAN: Copeland HISTORY FROM: patient and husband  REASON FOR VISIT: new consult    HISTORICAL  CHIEF COMPLAINT:  Chief Complaint  Patient presents with  . Referral    internal headaches    HISTORY OF PRESENT ILLNESS:   39 year old ambidextrous female here for evaluation of headaches. 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  Patient has history of headaches starting in childhood around 39 years old. She describes frontal, eye pressure, throbbing severe headaches with neck pain, photophobia, phonophobia and occasionally nausea. Headaches can last hours to days at a time. Triggers include stress. Over the years headaches varied in frequency and severity. More recently headaches have been occurring roughly 4-6 days per month. Patient has family history of cluster headache in her father. Patient herself has never been diagnosed with migraine headaches or tried migraine medications.  Patient had been hospitalized several times for headache, light sensitivity, neck pain, fevers, with testing and lumbar puncture results demonstrating viral/aseptic meningitis. 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 On the basis of this patient was diagnosed with recurrent meningitis (Mollaret's) in 2013. Since that time she has been prescribed valacyclovir 500 mg twice a day to be taken during times of recurrence of symptoms. She uses this 6-8 months out of the year, 3-4 days at a time. This has helped keep some of her symptoms under control. Her last hospitalization was in 2013 for meningitis.  More recently, last night patient had recurrence of headache. She had some nausea and vomiting last night. This morning and during office visit patient is suffering with severe headache, sensitivity to light, neck pain. Patient states that her current symptoms feel slightly similar  to her prior meningitis attacks but not as severe. Patient has not started valacyclovir yet for this attack.    REVIEW OF SYSTEMS: Full 14 system review of systems performed and notable only for memory loss confusion headache insomnia sleepiness snoring anxiety not asleep aching muscles joint pain flushing anemia.  ALLERGIES: No Known Allergies  HOME MEDICATIONS: Outpatient Prescriptions Prior to Visit  Medication Sig Dispense Refill  . cyclobenzaprine (FLEXERIL) 10 MG tablet Take 10 mg by mouth 3 (three) times daily as needed for muscle spasms.    . DULoxetine (CYMBALTA) 30 MG capsule Take 1 capsule (30 mg total) by mouth daily. 30 capsule 3  . etonogestrel (NEXPLANON) 68 MG IMPL implant Inject 1 each into the skin once.    . fluticasone (FLONASE) 50 MCG/ACT nasal spray Place 2 sprays into both nostrils daily. 16 g 6  . HYDROcodone-acetaminophen (NORCO) 10-325 MG per tablet Take 1 tablet by mouth every 8 (eight) hours as needed for severe pain (do not use extra tylenol with this, take stool softener). 30 tablet 0  . lidocaine (LIDODERM) 5 % Place 1 patch onto the skin daily. Remove & Discard patch within 12 hours or as directed by MD 30 patch 1  . ramelteon (ROZEREM) 8 MG tablet Take 1 tablet (8 mg total) by mouth at bedtime. Use as needed for sleep 30 tablet 3  . silver sulfADIAZINE (SILVADENE) 1 % cream Apply 1 application topically 2 (two) times daily. With nonadhesive dressing. 25 g 0  . valACYclovir (VALTREX) 500 MG tablet Take 500 mg by mouth 2 (two) times daily.     No facility-administered medications prior to visit.    PAST MEDICAL HISTORY: Past Medical History  Diagnosis Date  . Anxiety   . Meningitis, viral At age 29 and at age 79  . Other and unspecified ovarian cysts 2011- pelvic CT    GYN- Dr. Chevis Pretty  . Anemia   . Chronic headache disorder     s/p Meningitis x 2, tension- type plus allergic/sinus problems  . History of abnormal Pap smear   . Nasal fracture 2011  .  Arthritis   . Allergy   . Vision abnormalities     PAST SURGICAL HISTORY: Past Surgical History  Procedure Laterality Date  . Foot surgery  s/p 2 procedures  . Laparoscopy for ectopic pregnancy  1998    Fallopian tube removed  . Uterine artery embolization      FAMILY HISTORY: Family History  Problem Relation Age of Onset  . Migraines Father     cluster headache  . Hyperlipidemia Father   . Hypertension Father   . Cancer Mother   . Diabetes      paternal relaitve  . Seizures      maternal relative  . Rheum arthritis      maternal relatives  . Stroke Maternal Grandmother   . Diabetes Paternal Grandmother     SOCIAL HISTORY:  Social History   Social History  . Marital Status: Married    Spouse Name: N/A  . Number of Children: N/A  . Years of Education: N/A   Occupational History  . GIS Analyst    Social History Main Topics  . Smoking status: Former Games developer  . Smokeless tobacco: Not on file  . Alcohol Use: 0.0 oz/week    0 Standard drinks or equivalent per week  . Drug Use: No  . Sexual Activity: Not on file   Other Topics Concern  . Not on file   Social History Narrative     PHYSICAL EXAM  GENERAL EXAM/CONSTITUTIONAL: Vitals:  Filed Vitals:   12/25/14 1008 12/25/14 1021 12/25/14 1022  BP: 133/100 133/93 154/103  Pulse: 83 80 87  Height:  (1.6 m)    Weight: 213 lb (96.616 kg)       Body mass index is 37.74 kg/(m^2).  Visual Acuity Screening   Right eye Left eye Both eyes  Without correction:     With correction: 20/40 20/40      Patient is UNCOMFORTABLE APPEARING; well developed, nourished and groomed; SLIGHT NECK STIFFNESS  CARDIOVASCULAR:  Examination of carotid arteries is normal; no carotid bruits  Regular rate and rhythm, no murmurs  Examination of peripheral vascular system by observation and palpation is normal  EYES:  Ophthalmoscopic exam of optic discs and posterior segments is normal; no papilledema or  hemorrhages  MUSCULOSKELETAL:  Gait, strength, tone, movements noted in Neurologic exam below  NEUROLOGIC: MENTAL STATUS:  No flowsheet data found.  awake, alert, oriented to person, place and time  recent and remote memory intact  normal attention and concentration  language fluent, comprehension intact, naming intact,   fund of knowledge appropriate  CRANIAL NERVE:   2nd - no papilledema on fundoscopic exam  2nd, 3rd, 4th, 6th - pupils equal and reactive to light, visual fields full to confrontation, extraocular muscles intact, no nystagmus  5th - facial sensation symmetric  7th - facial strength symmetric  8th - hearing intact  9th - palate elevates symmetrically, uvula midline  11th - shoulder shrug symmetric  12th - tongue protrusion midline  MOTOR:   normal bulk and tone, full strength in the BUE, BLE  SENSORY:  normal and symmetric to light touch, pinprick, temperature, vibration  COORDINATION:   finger-nose-finger, fine finger movements normal  REFLEXES:   deep tendon reflexes present and symmetric  GAIT/STATION:   narrow based gait; able to walk tandem; romberg is negative    DIAGNOSTIC DATA (LABS, IMAGING, TESTING) - I reviewed patient records, labs, notes, testing and imaging myself where available.  Lab Results  Component Value Date   WBC 6.6 12/16/2014   HGB 11.8* 12/16/2014   HCT 36.5 12/16/2014   MCV 86.9 12/16/2014   PLT 328 12/16/2014      Component Value Date/Time   NA 136 11/03/2014 1146   K 4.5 11/03/2014 1146   CL 102 11/03/2014 1146   CO2 26 11/03/2014 1146   GLUCOSE 83 11/03/2014 1146   BUN 5* 11/03/2014 1146   CREATININE 0.63 11/03/2014 1146   CREATININE 0.72 07/09/2013 1615   CALCIUM 8.9 11/03/2014 1146   PROT 7.0 12/16/2014 0932   ALBUMIN 4.3 12/16/2014 0932   AST 22 12/16/2014 0932   ALT 22 12/16/2014 0932   ALKPHOS 39 12/16/2014 0932   BILITOT 0.9 12/16/2014 0932   GFRNONAA >89 10/14/2014 0944    GFRNONAA >90 07/09/2013 1615   GFRAA >89 10/14/2014 0944   GFRAA >90 07/09/2013 1615   Lab Results  Component Value Date   CHOL 175 12/16/2014   HDL 47 12/16/2014   LDLCALC 101 12/16/2014   TRIG 136 12/16/2014   CHOLHDL 3.7 12/16/2014   Lab Results  Component Value Date   HGBA1C 5.3 12/16/2014   No results found for: [id number] Lab Results  Component Value Date   TSH 1.306 12/16/2014    10/22/14 MRI thoracic spine [I reviewed images myself and agree with interpretation. -VRP]  - Multilevel mild disc degeneration without definitive impingement. - Multilevel mild to moderate left foraminal stenoses are noted above.  11/27/14 MRI lumbar spine [I reviewed images myself and agree with interpretation. -VRP]  - Disc bulging and facet hypertrophy L4-5 with mild to moderate spinal stenosis. - Large central disc protrusion L5-S1 with possible extruded disc fragments. There is severe spinal stenosis with compression of the thecal sac and S1 nerve roots bilaterally.   09/26/14 CT head [I reviewed images myself and agree with interpretation. -VRP]  - Stable and normal noncontrast CT appearance of the brain.     ASSESSMENT AND PLAN  39 y.o. year old female here with history of recurrent aseptic/viral meningitis since 2008-2013, also with history of migraine type headaches since age 65 years old, never officially diagnosed. Also with severe migraine attack since last night.   Ddx:  New onset headache - Plan: MR Brain W Wo Contrast, Ambulatory referral to Sleep Studies  Mollaret's meningitis - Plan: MR Brain W Wo Contrast, Ambulatory referral to Sleep Studies  Snoring - Plan: Ambulatory referral to Sleep Studies  Excessive daytime sleepiness - Plan: Ambulatory referral to Sleep Studies     PLAN:  - MRI brain to rule out secondary causes of HA - referral to sleep study (obesity, snoring, daytime fatigue, HA) - migraine cocktail infusion today (depacon, compazine, toradol) - TPX  + rizatriptan prn  Orders Placed This Encounter  Procedures  . MR Brain W Wo Contrast  . Ambulatory referral to Sleep Studies   Meds ordered this encounter  Medications  . topiramate (TOPAMAX) 50 MG tablet    Sig: Take 1 tablet (50 mg total) by mouth 2 (two) times daily.    Dispense:  60 tablet    Refill:  12  . rizatriptan (MAXALT-MLT) 10 MG disintegrating tablet    Sig: Take 1 tablet (10 mg total) by mouth as needed for migraine. May repeat in 2 hours if needed    Dispense:  9 tablet    Refill:  11   Return in about 6 weeks (around 02/05/2015).    Suanne Marker, MD 12/25/2014, 11:10 AM Certified in Neurology, Neurophysiology and Neuroimaging  [facility] [address], [address] [place], [place] [address] [phone]

## 2014-12-25 NOTE — Patient Instructions (Addendum)
Thank you for coming to see [place] at [facility]. I hope we have been able to provide you high quality care today.  You may receive a patient satisfaction survey over the next few weeks. We would appreciate your feedback and comments so that we may continue to improve ourselves and the health of our patients.  - I will check MRI brain and refer you for a sleep study  - try topiramate $RemoveBefore'50mg'[id number]$  at bedtime x 2 weeks; then increase to twice a day  - use rizatriptan as needed for breakthrough headaches; may repeat x 1 after 2 hours; max 2 per day or 8 per month  - use zofran as needed for nausea   ~~~~~~~~~~~~~~~~~~~~~~~~~~~~~~~~~~~~~~~~~~~~~~~~~~~~~~~~~~~~~~~~~ DR. PENUMALLI'S BRIEF GUIDE TO HAPPY AND HEALTHY LIVING These are some of my general health and wellness recommendations. Some of them may apply to you better than others. Please use common sense as you try these suggestions and feel free to ask me any questions.   ACTIVITY/FITNESS Mental, social, emotional and physical stimulation are very important for brain and body health. Try learning a new activity (arts, music, language, sports, games).  Keep moving your body to the best of your abilities. You can do this at home, the gym, community center, park or anywhere you like. Consider the app Sworkit to help get started. Fitness trackers such as smart-watches, smart-phones or Fitbits can help as well.   NUTRITION Eat more plants: colorful vegetables, nuts, seeds and berries.  Eat less sugar, salt, preservatives and processed foods.  Avoid toxins such as cigarettes and alcohol.  Drink water when you are thirsty. Warm water with a slice of lemon is an excellent morning drink to start the day.  Consider reviewing these nutrition summaries at the website [employer]: WindowBlog.ch   RELAXATION Consider practicing mindfulness meditation or other relaxation techniques such as deep  breathing, prayer, yoga, tai chi, massage. Consider the app Headspace to help get started.   SLEEP Try to get at least 7-8+ hours sleep per day. Regular exercise and reduced caffeine will help you sleep better.   PLANNING Prepare estate planning, living will, healthcare POA documents. Sometimes this is best planned with the help of an attorney. Theconversationproject.org and agingwithdignity.org are excellent resources.

## 2015-01-07 ENCOUNTER — Encounter: Payer: Self-pay | Admitting: Neurology

## 2015-01-07 ENCOUNTER — Ambulatory Visit (INDEPENDENT_AMBULATORY_CARE_PROVIDER_SITE_OTHER): Payer: Commercial Managed Care - HMO | Admitting: Neurology

## 2015-01-07 VITALS — BP 128/78 | HR 90 | Resp 16 | Ht 63.0 in | Wt 208.0 lb

## 2015-01-07 DIAGNOSIS — G4719 Other hypersomnia: Secondary | ICD-10-CM

## 2015-01-07 DIAGNOSIS — R519 Headache, unspecified: Secondary | ICD-10-CM

## 2015-01-07 DIAGNOSIS — R51 Headache: Secondary | ICD-10-CM | POA: Diagnosis not present

## 2015-01-07 DIAGNOSIS — R351 Nocturia: Secondary | ICD-10-CM | POA: Diagnosis not present

## 2015-01-07 DIAGNOSIS — R0683 Snoring: Secondary | ICD-10-CM | POA: Diagnosis not present

## 2015-01-07 DIAGNOSIS — E669 Obesity, unspecified: Secondary | ICD-10-CM | POA: Diagnosis not present

## 2015-01-07 NOTE — Patient Instructions (Signed)

## 2015-01-07 NOTE — Progress Notes (Signed)
Subjective:    [name] is a 39 y.o. female.  HPI     Angela Foley, MD, PhD [facility] [address], [address] [address] [place], [place] [address]  Dear Satira Sark,   I saw your patient, Angela Farrell, upon your kind request in my clinic today for initial consultation of her sleep disorder, in particular, concern for underlying obstructive sleep apnea. The patient is unaccompanied today. As you know, Angela Farrell is a 39 year old right-handed woman with an underlying medical history of hypertension, Mollaret's meningitis, anxiety, anemia, arthritis, allergies, and obesity, who reports snoring and excessive daytime somnolence. I reviewed your office note from 12/25/2014. She has trouble staying asleep and going to sleep. This has been going on for years. She has recently started Rozerem 8 mg each night per primary care physician. She has a history of low back pain and takes Flexeril. She has morning headaches, to 3 times per week. She has been able to lose some weight and attributes some of this to being on Topamax for migraine prevention. Morning headaches are often dull achy and bifrontal. She has nocturia once per night. Bedtime is around 11 PM to midnight generally speaking and it may take her 1-2 hours to fall asleep. Rise time is 6:30 to 7. She has a TV in the bedroom and typically has it on at night prior to falling asleep. She does admit to smoking cigarettes occasionally. She drinks alcohol occasionally, she drinks coffee 12 ounce in the morning and some sweet tea at times and typically no sodas. She has no family history of obstructive sleep apnea but her father snores loudly. She denies any restless leg symptoms but is a restless sleeper with a lot of tossing and turning at night. She is not able to sleep on her back because of discomfort and sciatica type pain on the left. She is not sure if she twitches her legs in her sleep. She lives at home with her  husband. She works full-time and works at [employer]. She has 1 grown daughter, age 68, in college at [employer]. Her Epworth sleepiness score is 11 out of 24 today, her fatigue score is 42 out of 63.  Her Past Medical History Is Significant For: Past Medical History  Diagnosis Date  . Anxiety   . Meningitis, viral At age 27 and at age 34  . Other and unspecified ovarian cysts 2011- pelvic CT    GYN- Dr. Chevis Pretty  . Anemia   . Chronic headache disorder     s/p Meningitis x 2, tension- type plus allergic/sinus problems  . History of abnormal Pap smear   . Nasal fracture 2011  . Arthritis   . Allergy   . Vision abnormalities     Her Past Surgical History Is Significant For: Past Surgical History  Procedure Laterality Date  . Foot surgery  s/p 2 procedures  . Laparoscopy for ectopic pregnancy  1998    Fallopian tube removed  . Uterine artery embolization    . Mouth surgery      Her Family History Is Significant For: Family History  Problem Relation Age of Onset  . Migraines Father     cluster headache  . Hyperlipidemia Father   . Hypertension Father   . Cancer Mother   . Diabetes      paternal relaitve  . Seizures      maternal relative  . Rheum arthritis      maternal relatives  .  Stroke Maternal Grandmother   . Diabetes Paternal Grandmother     Her Social History Is Significant For: Social History   Social History  . Marital Status: Married    Spouse Name: N/A  . Number of Children: N/A  . Years of Education: N/A   Occupational History  . GIS Analyst    Social History Main Topics  . Smoking status: Former Games developer  . Smokeless tobacco: None  . Alcohol Use: 0.0 oz/week    0 Standard drinks or equivalent per week  . Drug Use: No  . Sexual Activity: Not Asked   Other Topics Concern  . None   Social History Narrative   Drinks about 12-16oz of coffee a day, occasionally drinks a tea     Her Allergies Are:  No Known Allergies:   Her Current Medications  Are:  Outpatient Encounter Prescriptions as of 01/07/2015  Medication Sig  . cyclobenzaprine (FLEXERIL) 10 MG tablet Take 10 mg by mouth 3 (three) times daily as needed for muscle spasms.  . DULoxetine (CYMBALTA) 30 MG capsule Take 1 capsule (30 mg total) by mouth daily.  Marland Kitchen etonogestrel (NEXPLANON) 68 MG IMPL implant Inject 1 each into the skin once.  . fluticasone (FLONASE) 50 MCG/ACT nasal spray Place 2 sprays into both nostrils daily.  Marland Kitchen HYDROcodone-acetaminophen (NORCO) 10-325 MG per tablet Take 1 tablet by mouth every 8 (eight) hours as needed for severe pain (do not use extra tylenol with this, take stool softener).  Marland Kitchen lidocaine (LIDODERM) 5 % Place 1 patch onto the skin daily. Remove & Discard patch within 12 hours or as directed by MD  . Magnesium Salicylate 325 MG TABS Take by mouth.  . ramelteon (ROZEREM) 8 MG tablet Take 1 tablet (8 mg total) by mouth at bedtime. Use as needed for sleep  . rizatriptan (MAXALT-MLT) 10 MG disintegrating tablet Take 1 tablet (10 mg total) by mouth as needed for migraine. May repeat in 2 hours if needed  . topiramate (TOPAMAX) 50 MG tablet Take 1 tablet (50 mg total) by mouth 2 (two) times daily.  . valACYclovir (VALTREX) 500 MG tablet Take 500 mg by mouth 2 (two) times daily.  . [DISCONTINUED] silver sulfADIAZINE (SILVADENE) 1 % cream Apply 1 application topically 2 (two) times daily. With nonadhesive dressing.   No facility-administered encounter medications on file as of 01/07/2015.  :  Review of Systems:  Out of a complete 14 point review of systems, all are reviewed and negative with the exception of these symptoms as listed below:   Review of Systems  Constitutional: Positive for fatigue.  Eyes:       Blurred vision   Endocrine: Positive for polydipsia.       Feeling hot, feeling cold  Allergic/Immunologic: Positive for environmental allergies.  Neurological: Positive for headaches.       Memory loss, trouble falling and staying asleep,  snoring, wakes up feeling tired, morning headaches, denies taking naps.   Psychiatric/Behavioral:       Too much sleep, not enough sleep, decreased energy     Objective:  Neurologic Exam  Physical Exam Physical Examination:   Filed Vitals:   01/07/15 1309  BP: 128/78  Pulse: 90  Resp: 16    General Examination: The patient is a very pleasant 39 y.o. female in no acute distress. She appears well-developed and well-nourished and well groomed. She is obese.  HEENT: Normocephalic, atraumatic, pupils are equal, round and reactive to light and accommodation. Funduscopic exam is normal with  sharp disc margins noted. Extraocular tracking is good without limitation to gaze excursion or nystagmus noted. Normal smooth pursuit is noted. Hearing is grossly intact. Tympanic membranes are clear bilaterally. Face is symmetric with normal facial animation and normal facial sensation. Speech is clear with no dysarthria noted. There is no hypophonia. There is no lip, neck/head, jaw or voice tremor. Neck is supple with full range of passive and active motion. There are no carotid bruits on auscultation. Oropharynx exam reveals: mild mouth dryness, good dental hygiene and mild airway crowding, due to narrow airway entry and slightly wider tongue. Mallampati is class II. Tongue protrudes centrally and palate elevates symmetrically. Tonsils are small. Neck size is 17 inches. She has a Mild overbite. Nasal inspection reveals no significant nasal mucosal bogginess or redness and no septal deviation.   Chest: Clear to auscultation without wheezing, rhonchi or crackles noted.  Heart: S1+S2+0, regular and normal without murmurs, rubs or gallops noted.   Abdomen: Soft, non-tender and non-distended with normal bowel sounds appreciated on auscultation.  Extremities: There is no pitting edema in the distal lower extremities bilaterally. Pedal pulses are intact.  Skin: Warm and dry without trophic changes noted. There  are no varicose veins.  Musculoskeletal: exam reveals no obvious joint deformities, tenderness or joint swelling or erythema.   Neurologically:  Mental status: The patient is awake, alert and oriented in all 4 spheres. Her immediate and remote memory, attention, language skills and fund of knowledge are appropriate. There is no evidence of aphasia, agnosia, apraxia or anomia. Speech is clear with normal prosody and enunciation. Thought process is linear. Mood is normal and affect is normal.  Cranial nerves II - XII are as described above under HEENT exam. In addition: shoulder shrug is normal with equal shoulder height noted. Motor exam: Normal bulk, strength and tone is noted. There is no drift, tremor or rebound. Romberg is negative. Reflexes are 2+ throughout. Babinski: Toes are flexor bilaterally. Fine motor skills and coordination: intact with normal finger taps, normal hand movements, normal rapid alternating patting, normal foot taps and normal foot agility.  Cerebellar testing: No dysmetria or intention tremor on finger to nose testing. Heel to shin is unremarkable bilaterally. There is no truncal or gait ataxia.  Sensory exam: intact to light touch, pinprick, vibration, temperature sense in the upper and lower extremities.  Gait, station and balance: She stands easily. No veering to one side is noted. No leaning to one side is noted. Posture is age-appropriate and stance is narrow based. Gait shows normal stride length and normal pace. No problems turning are noted. She turns en bloc. Tandem walk is unremarkable, slightly slow.   Assessment and Plan:   In summary, KAIDEN DARDIS is a very pleasant 39 y.o.-year old female with an underlying medical history of hypertension, Mollaret's meningitis, anxiety, anemia, arthritis, allergies, and obesity, who reports snoring and excessive daytime somnolence. Her history and physical exam are indeed concerning for obstructive sleep apnea (OSA). I had a  long chat with the patient about my findings and the diagnosis of OSA, its prognosis and treatment options. We talked about medical treatments, surgical interventions and non-pharmacological approaches. I explained in particular the risks and ramifications of untreated moderate to severe OSA, especially with respect to developing cardiovascular disease down the Road, including congestive heart failure, difficult to treat hypertension, cardiac arrhythmias, or stroke. Even type 2 diabetes has, in part, been linked to untreated OSA. Symptoms of untreated OSA include daytime sleepiness, memory problems, mood  irritability and mood disorder such as depression and anxiety, lack of energy, as well as recurrent headaches, especially morning headaches. We talked about trying to maintain a healthy lifestyle in general, as well as the importance of weight control. I encouraged the patient to eat healthy, exercise daily and keep well hydrated, to keep a scheduled bedtime and wake time routine, to not skip any meals and eat healthy snacks in between meals. I advised the patient not to drive when feeling sleepy. I recommended the following at this time: sleep study with potential positive airway pressure titration. (We will score hypopneas at 4% and split the sleep study into diagnostic and treatment portion, if the estimated. 2 hour AHI is >20/h).   I explained the sleep test procedure to the patient and also outlined possible surgical and non-surgical treatment options of OSA, including the use of a custom-made dental device (which would require a referral to a specialist dentist or oral surgeon), upper airway surgical options, such as pillar implants, radiofrequency surgery, tongue base surgery, and UPPP (which would involve a referral to an ENT surgeon). Rarely, jaw surgery such as mandibular advancement may be considered.  I also explained the CPAP treatment option to the patient, who indicated that she would be willing  to try CPAP if the need arises. I explained the importance of being compliant with PAP treatment, not only for insurance purposes but primarily to improve Her symptoms, and for the patient's long term health benefit, including to reduce Her cardiovascular risks. I answered all her questions today and the patient was in agreement. I would like to see her back after the sleep study is completed and encouraged her to call with any interim questions, concerns, problems or updates.   Thank you very much for allowing me to participate in the care of this nice patient. If I can be of any further assistance to you please do not hesitate to talk to me.  Sincerely,   Angela Foley, MD, PhD

## 2015-01-20 ENCOUNTER — Ambulatory Visit (INDEPENDENT_AMBULATORY_CARE_PROVIDER_SITE_OTHER): Payer: Commercial Managed Care - HMO

## 2015-01-20 DIAGNOSIS — R51 Headache: Secondary | ICD-10-CM | POA: Diagnosis not present

## 2015-01-20 DIAGNOSIS — R519 Headache, unspecified: Secondary | ICD-10-CM

## 2015-01-20 DIAGNOSIS — G032 Benign recurrent meningitis [Mollaret]: Secondary | ICD-10-CM | POA: Diagnosis not present

## 2015-01-20 MED ORDER — GADOPENTETATE DIMEGLUMINE 469.01 MG/ML IV SOLN: 15.0000 mL | Freq: Once | INTRAVENOUS | Status: DC | PRN

## 2015-01-26 ENCOUNTER — Telehealth: Payer: Self-pay | Admitting: *Deleted

## 2015-01-26 NOTE — Telephone Encounter (Signed)
Spoke with patient and informed her, per Dr Marjory LiesPenumalli her MRI results are normal. Reminded her of her FU 02/08/15. She verbalized understanding, appreciation.

## 2015-02-08 ENCOUNTER — Ambulatory Visit: Payer: Commercial Managed Care - HMO | Admitting: Diagnostic Neuroimaging

## 2015-02-09 HISTORY — PX: BACK SURGERY: SHX140

## 2015-03-01 ENCOUNTER — Other Ambulatory Visit: Payer: Self-pay | Admitting: Family Medicine

## 2015-03-02 ENCOUNTER — Ambulatory Visit (INDEPENDENT_AMBULATORY_CARE_PROVIDER_SITE_OTHER): Payer: Commercial Managed Care - HMO | Admitting: Neurology

## 2015-03-02 DIAGNOSIS — G4720 Circadian rhythm sleep disorder, unspecified type: Secondary | ICD-10-CM

## 2015-03-02 DIAGNOSIS — G4710 Hypersomnia, unspecified: Secondary | ICD-10-CM

## 2015-03-03 ENCOUNTER — Telehealth: Payer: Self-pay | Admitting: Neurology

## 2015-03-03 NOTE — Telephone Encounter (Signed)
Patient referred by Dr. Marjory LiesPenumalli, seen by me on 01/07/15, diagnostic PSG on 03/02/15.   Please call and notify the patient that the recent sleep study did not show any significant obstructive sleep apnea. Please inform patient that I would like to go over the details of the study during a follow up appointment. Arrange a followup appointment. Also, route or fax report to PCP and referring MD, if other than PCP.  Once you have spoken to patient, you can close this encounter.   Thanks,  Huston FoleySaima Marce Charlesworth, MD, PhD [facility])

## 2015-03-03 NOTE — Telephone Encounter (Signed)
I spoke to patient and she is aware of results. I will send report to PCP and appt was made for Monday.

## 2015-03-03 NOTE — Sleep Study (Signed)
Please see the scanned sleep study interpretation located in the procedure tab in the chart view section.  

## 2015-03-08 ENCOUNTER — Ambulatory Visit (INDEPENDENT_AMBULATORY_CARE_PROVIDER_SITE_OTHER): Payer: Commercial Managed Care - HMO | Admitting: Neurology

## 2015-03-08 ENCOUNTER — Encounter: Payer: Self-pay | Admitting: Neurology

## 2015-03-08 VITALS — BP 136/80 | HR 82 | Resp 18 | Ht 63.0 in | Wt 218.0 lb

## 2015-03-08 DIAGNOSIS — G479 Sleep disorder, unspecified: Secondary | ICD-10-CM | POA: Diagnosis not present

## 2015-03-08 NOTE — Patient Instructions (Addendum)
Your sleep study did not show any significant underlying organic sleep disorder.  Please remember to try to maintain good sleep hygiene, which means: Keep a regular sleep and wake schedule, try not to exercise or have a meal within 2 hours of your bedtime, try to keep your bedroom conducive for sleep, that is, cool and dark, without light distractors such as an illuminated alarm clock, and refrain from watching TV right before sleep or in the middle of the night and do not keep the TV or radio on during the night. Also, try not to use or play on electronic devices at bedtime, such as your cell phone, tablet PC or laptop. If you like to read at bedtime on an electronic device, try to dim the background light as much as possible. Do not eat in the middle of the night.    You can try Melatonin at night for sleep: take 3 to 5 mg one to 2 hours before your bedtime.

## 2015-03-08 NOTE — Progress Notes (Signed)
Subjective:    [name] is a 39 y.o. female.  HPI     Interim history:   [name] is a 39 year old right-handed woman with an underlying medical history of hypertension, Mollaret's meningitis, anxiety, anemia, arthritis, allergies, and obesity, who presents for follow-up consultation of her sleep disorder, after her recent sleep study. The patient is accompanied by her husband today. I first met her on 01/07/2015 at the request of Dr.  Leta Baptist, at which time the patient reported snoring and excessive daytime somnolence. I invited her back for sleep study. She had baseline sleep study on 03/02/2015, and I went over her test results with her in detail today. Her sleep efficiency was normal at 94.8% with a normal sleep latency, and wake after sleep onset of 22.5 minutes with minimal sleep fragmentation noted, arousal index was normal, she had an increased percentage of stage II sleep and absence of slow-wave sleep and REM sleep. She had no significant PLMS, EKG or EEG changes. She had minimal intermittent snoring. Total AHI was normal at 1.1 per hour, average oxygen saturation 93%, nadir was 88%. Time below 90% saturation was less than 2 minutes.  Today, 03/08/2015: She reports no new sleep-related issues, but she does report residual left-sided sciatica type symptoms. She had low back surgery on 02/09/2015. She had a repeat MRI and a follow-up appointment today to discuss findings. She has a follow-up appointment with Dr. Leta Baptist later this month. She feels that she has trouble maintaining sleep. She has been on Rozerem per primary care physician. She has residual low back pain.  Previously:   01/07/2015: She reports snoring and excessive daytime somnolence. I reviewed your office note from 12/25/2014. She has trouble staying asleep and going to sleep. This has been going on for years. She has recently started Rozerem 8 mg each night per primary care physician. She has a history  of low back pain and takes Flexeril. She has morning headaches, to 3 times per week. She has been able to lose some weight and attributes some of this to being on Topamax for migraine prevention. Morning headaches are often dull achy and bifrontal. She has nocturia once per night. Bedtime is around 11 PM to midnight generally speaking and it may take her 1-2 hours to fall asleep. Rise time is 6:30 to 7. She has a TV in the bedroom and typically has it on at night prior to falling asleep. She does admit to smoking cigarettes occasionally. She drinks alcohol occasionally, she drinks coffee 12 ounce in the morning and some sweet tea at times and typically no sodas. She has no family history of obstructive sleep apnea but her father snores loudly. She denies any restless leg symptoms but is a restless sleeper with a lot of tossing and turning at night. She is not able to sleep on her back because of discomfort and sciatica type pain on the left. She is not sure if she twitches her legs in her sleep. She lives at home with her husband. She works full-time and works at [employer]. She has 1 grown daughter, age 56, in college at [employer]. Her Epworth sleepiness score is 11 out of 24 today, her fatigue score is 42 out of 63.   Her Past Medical History Is Significant For: Past Medical History  Diagnosis Date  . Anxiety   . Meningitis, viral At age 57 and at age 83  . Other and unspecified ovarian cysts 2011- pelvic CT  GYN- Dr. Carren Rang  . Anemia   . Chronic headache disorder     s/p Meningitis x 2, tension- type plus allergic/sinus problems  . History of abnormal Pap smear   . Nasal fracture 2011  . Arthritis   . Allergy   . Vision abnormalities     Her Past Surgical History Is Significant For: Past Surgical History  Procedure Laterality Date  . Foot surgery  s/p 2 procedures  . Laparoscopy for ectopic pregnancy  1998    Fallopian tube removed  . Uterine artery embolization    . Mouth surgery       Her Family History Is Significant For: Family History  Problem Relation Age of Onset  . Migraines Father     cluster headache  . Hyperlipidemia Father   . Hypertension Father   . Cancer Mother   . Diabetes      paternal relaitve  . Seizures      maternal relative  . Rheum arthritis      maternal relatives  . Stroke Maternal Grandmother   . Diabetes Paternal Grandmother     Her Social History Is Significant For: Social History   Social History  . Marital Status: Married    Spouse Name: N/A  . Number of Children: N/A  . Years of Education: N/A   Occupational History  . GIS Analyst    Social History Main Topics  . Smoking status: Former Research scientist (life sciences)  . Smokeless tobacco: None  . Alcohol Use: 0.0 oz/week    0 Standard drinks or equivalent per week  . Drug Use: No  . Sexual Activity: Not Asked   Other Topics Concern  . None   Social History Narrative   Drinks about 12-16oz of coffee a day, occasionally drinks a tea     Her Allergies Are:  No Known Allergies:   Her Current Medications Are:  Outpatient Encounter Prescriptions as of 03/08/2015  Medication Sig  . cyclobenzaprine (FLEXERIL) 10 MG tablet Take 10 mg by mouth 3 (three) times daily as needed for muscle spasms.  . diazepam (VALIUM) 5 MG tablet TAKE 1 TABLET BY MOUTH EVERY 6 HOURS AS NEEDED FOR MUSCLE SPASM  . DULoxetine (CYMBALTA) 30 MG capsule TAKE ONE CAPSULE BY MOUTH EVERY DAY "NO MORE REFILLS WITHOUT OFFICE VISIT"  . etonogestrel (NEXPLANON) 68 MG IMPL implant Inject 1 each into the skin once.  . fluticasone (FLONASE) 50 MCG/ACT nasal spray Place 2 sprays into both nostrils daily.  Marland Kitchen HYDROcodone-acetaminophen (NORCO) 10-325 MG per tablet Take 1 tablet by mouth every 8 (eight) hours as needed for severe pain (do not use extra tylenol with this, take stool softener).  Marland Kitchen LYRICA 75 MG capsule TAKE ONE CAPSULE BY MOUTH TWICE A DAY AS NEEDED  . Magnesium Salicylate 818 MG TABS Take by mouth.  .  oxyCODONE-acetaminophen (PERCOCET) 10-325 MG tablet TAKE 1 TABLET BY MOUTH 2 TO 3 TIMES A DAY  . ramelteon (ROZEREM) 8 MG tablet Take 1 tablet (8 mg total) by mouth at bedtime. Use as needed for sleep  . rizatriptan (MAXALT-MLT) 10 MG disintegrating tablet Take 1 tablet (10 mg total) by mouth as needed for migraine. May repeat in 2 hours if needed  . topiramate (TOPAMAX) 50 MG tablet Take 1 tablet (50 mg total) by mouth 2 (two) times daily.  . valACYclovir (VALTREX) 500 MG tablet Take 500 mg by mouth 2 (two) times daily.  . [DISCONTINUED] lidocaine (LIDODERM) 5 % Place 1 patch onto the skin daily. Remove &  Discard patch within 12 hours or as directed by MD   Facility-Administered Encounter Medications as of 03/08/2015  Medication  . gadopentetate dimeglumine (MAGNEVIST) injection 15 mL  :  Review of Systems:  Out of a complete 14 point review of systems, all are reviewed and negative with the exception of these symptoms as listed below:   Review of Systems  Neurological:       Patient is here to discuss sleep study and has no new concerns.     Objective:  Neurologic Exam  Physical Exam Physical Examination:   Filed Vitals:   03/08/15 0945  BP: 136/80  Pulse: 82  Resp: 18    General Examination: The patient is a very pleasant 39 y.o. female in no acute distress. She appears well-developed and well-nourished and well groomed. She is obese.  HEENT: Normocephalic, atraumatic, pupils are equal, round and reactive to light and accommodation. Funduscopic exam is normal with sharp disc margins noted. Extraocular tracking is good without limitation to gaze excursion or nystagmus noted. Normal smooth pursuit is noted. Hearing is grossly intact. Face is symmetric with normal facial animation and normal facial sensation. Speech is clear with no dysarthria noted. There is no hypophonia. There is no lip, neck/head, jaw or voice tremor. Neck is supple with full range of passive and active motion.  There are no carotid bruits on auscultation. Oropharynx exam reveals: moderate mouth dryness, good dental hygiene and mild airway crowding, due to narrow airway entry and slightly wider tongue. Mallampati is class II. Tongue protrudes centrally and palate elevates symmetrically. Tonsils are small. She has a Mild overbite.   Chest: Clear to auscultation without wheezing, rhonchi or crackles noted.  Heart: S1+S2+0, regular and normal without murmurs, rubs or gallops noted.   Abdomen: Soft, non-tender and non-distended with normal bowel sounds appreciated on auscultation.  Extremities: There is no pitting edema in the distal lower extremities bilaterally. Pedal pulses are intact.  Skin: Warm and dry without trophic changes noted. There are no varicose veins.  Musculoskeletal: exam reveals no obvious joint deformities, tenderness or joint swelling or erythema, but she reports LBP.   Neurologically:  Mental status: The patient is awake, alert and oriented in all 4 spheres. Her immediate and remote memory, attention, language skills and fund of knowledge are appropriate. There is no evidence of aphasia, agnosia, apraxia or anomia. Speech is clear with normal prosody and enunciation. Thought process is linear. Mood is normal and affect is normal.  Cranial nerves II - XII are as described above under HEENT exam. In addition: shoulder shrug is normal with equal shoulder height noted. Motor exam: Normal bulk, strength and tone is noted. There is no drift, tremor or rebound. Romberg is negative. Reflexes are 2+ throughout. Fine motor skills and coordination: intact. Sensory exam: intact to light touch in the upper and lower extremities.  Gait, station and balance: She stands slowly. She walks cautiously. She turns slowly.    Assessment and Plan:   In summary, TRACI GAFFORD is a very pleasant 39 y.o.-year old female with an underlying medical history of hypertension, Mollaret's meningitis, anxiety,  anemia, arthritis, allergies, obesity and recent lumbar spine surgery on 02/09/2015, who presents for follow-up consultation after her recent baseline sleep study. She had a diagnostic polysomnogram on 03/02/2015 and I went over her test results with her and her husband in detail today. Unfortunately, she did not achieve any slow-wave sleep or REM sleep. She had no significant sleep disordered breathing and no significant  PLMs. I talked to her about maintaining good sleep hygiene today. I suggested she try melatonin for sleep but reminded her has to be taken on a scheduled basis, about 1-2 hours before her projected bedtime. Her physical exam is stable with the exception of low back pain reported and slow and cautious gait. At this juncture, she is advised to follow-up with Dr. Leta Baptist as planned.  I spent 15 minutes in total face-to-face time with the patient, more than 50% of which was spent in counseling and coordination of care, reviewing test results, reviewing medication and discussing or reviewing the diagnosis of sleep disturbance, its prognosis and treatment options.

## 2015-03-10 HISTORY — PX: OTHER SURGICAL HISTORY: SHX169

## 2015-03-15 ENCOUNTER — Other Ambulatory Visit: Payer: Self-pay | Admitting: Family Medicine

## 2015-03-23 ENCOUNTER — Encounter: Payer: Self-pay | Admitting: Diagnostic Neuroimaging

## 2015-03-23 ENCOUNTER — Ambulatory Visit (INDEPENDENT_AMBULATORY_CARE_PROVIDER_SITE_OTHER): Payer: Commercial Managed Care - HMO | Admitting: Diagnostic Neuroimaging

## 2015-03-23 VITALS — BP 127/80 | HR 88 | Ht 63.0 in | Wt 213.0 lb

## 2015-03-23 DIAGNOSIS — G43009 Migraine without aura, not intractable, without status migrainosus: Secondary | ICD-10-CM | POA: Diagnosis not present

## 2015-03-23 DIAGNOSIS — G032 Benign recurrent meningitis [Mollaret]: Secondary | ICD-10-CM | POA: Diagnosis not present

## 2015-03-23 MED ORDER — TOPIRAMATE 50 MG PO TABS: 50.0000 mg | ORAL_TABLET | Freq: Two times a day (BID) | ORAL | Status: DC

## 2015-03-23 MED ORDER — RIZATRIPTAN BENZOATE 10 MG PO TBDP: 10.0000 mg | ORAL_TABLET | ORAL | Status: DC | PRN

## 2015-03-23 NOTE — Progress Notes (Signed)
[facility]  PATIENT: Angela EconomySheila C Farrell DOB: 1975/06/01  REFERRING CLINICIAN: Cop[place] HISTORY FROM: patient and husband  REASON FOR VISIT: follow up   HISTORICAL  CHIEF COMPLAINT:  Chief Complaint  Patient presents with  . Headache    rm 6, husband - [name], " bad headaches recently, Maxalt helpful after 2nd dose"  . Follow-up    3 month    HISTORY OF PRESENT ILLNESS:   UPDATE 03/23/15: Since last visit, was doing better with TPX + rizatriptan; then had back surgery Feb 09, 2015; then Dec 2016 having more HA.  PRIOR HPI (12/25/14): 39 year old ambidextrous female here for evaluation of headaches. 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 Patient has history of headaches starting in childhood around 39 years old. She describes frontal, eye pressure, throbbing severe headaches with neck pain, photophobia, phonophobia and occasionally nausea. Headaches can last hours to days at a time. Triggers include stress. Over the years headaches varied in frequency and severity. More recently headaches have been occurring roughly 4-6 days per month. Patient has family history of cluster headache in her father. Patient herself has never been diagnosed with migraine headaches or tried migraine medications. Patient had been hospitalized several times for headache, light sensitivity, neck pain, fevers, with testing and lumbar puncture results demonstrating viral/aseptic meningitis. 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 On the basis of this patient was diagnosed with recurrent meningitis (Mollaret's) in 2013. Since that time she has been prescribed valacyclovir 500 mg twice a day to be taken during times of recurrence of symptoms. She uses this 6-8 months out of the year, 3-4 days at a time. This has helped keep some of her symptoms under control. Her last hospitalization was in 2013 for meningitis. More recently, last night patient had  recurrence of headache. She had some nausea and vomiting last night. This morning and during office visit patient is suffering with severe headache, sensitivity to light, neck pain. Patient states that her current symptoms feel slightly similar to her prior meningitis attacks but not as severe. Patient has not started valacyclovir yet for this attack.   REVIEW OF SYSTEMS: Full 14 system review of systems performed and notable only for memory loss confusion headache insomnia sleepiness snoring anxiety not asleep aching muscles joint pain flushing anemia.  ALLERGIES: No Known Allergies  HOME MEDICATIONS: Outpatient Prescriptions Prior to Visit  Medication Sig Dispense Refill  . diazepam (VALIUM) 5 MG tablet TAKE 1 TABLET BY MOUTH EVERY 6 HOURS AS NEEDED FOR MUSCLE SPASM  1  . etonogestrel (NEXPLANON) 68 MG IMPL implant Inject 1 each into the skin once.    . fluticasone (FLONASE) 50 MCG/ACT nasal spray Place 2 sprays into both nostrils daily. 16 g 6  . HYDROcodone-acetaminophen (NORCO) 10-325 MG per tablet Take 1 tablet by mouth every 8 (eight) hours as needed for severe pain (do not use extra tylenol with this, take stool softener). 30 tablet 0  . oxyCODONE-acetaminophen (PERCOCET) 10-325 MG tablet TAKE 1 TABLET BY MOUTH 2 TO 3 TIMES A DAY  0  . ramelteon (ROZEREM) 8 MG tablet Take 1 tablet (8 mg total) by mouth at bedtime. Use as needed for sleep 30 tablet 3  . rizatriptan (MAXALT-MLT) 10 MG disintegrating tablet Take 1 tablet (10 mg total) by mouth as needed for migraine. May repeat in 2 hours if needed 9 tablet 11  . topiramate (TOPAMAX) 50 MG tablet Take 1 tablet (50 mg  total) by mouth 2 (two) times daily. 60 tablet 12  . valACYclovir (VALTREX) 500 MG tablet Take 500 mg by mouth 2 (two) times daily.    . cyclobenzaprine (FLEXERIL) 10 MG tablet Take 10 mg by mouth 3 (three) times daily as needed for muscle spasms.    . DULoxetine (CYMBALTA) 30 MG capsule Take 1 capsule (30 mg total) by mouth  daily. 90 capsule 0  . LYRICA 75 MG capsule TAKE ONE CAPSULE BY MOUTH TWICE A DAY AS NEEDED  1  . Magnesium Salicylate 325 MG TABS Take by mouth.     Facility-Administered Medications Prior to Visit  Medication Dose Route Frequency Provider Last Rate Last Dose  . gadopentetate dimeglumine (MAGNEVIST) injection 15 mL  15 mL Intravenous Once PRN Suanne Marker, MD        PAST MEDICAL HISTORY: Past Medical History  Diagnosis Date  . Anxiety   . Meningitis, viral At age 46 and at age 59  . Other and unspecified ovarian cysts 2011- pelvic CT    GYN- Dr. Chevis Pretty  . Anemia   . Chronic headache disorder     s/p Meningitis x 2, tension- type plus allergic/sinus problems  . History of abnormal Pap smear   . Nasal fracture 2011  . Arthritis   . Allergy   . Vision abnormalities     PAST SURGICAL HISTORY: Past Surgical History  Procedure Laterality Date  . Foot surgery  s/p 2 procedures  . Laparoscopy for ectopic pregnancy  1998    Fallopian tube removed  . Uterine artery embolization    . Mouth surgery    . Back surgery  02/09/15    L5 - S1 diskectomy  . Spinal injection  03/10/15    FAMILY HISTORY: Family History  Problem Relation Age of Onset  . Migraines Father     cluster headache  . Hyperlipidemia Father   . Hypertension Father   . Cancer Mother   . Diabetes      paternal relaitve  . Seizures      maternal relative  . Rheum arthritis      maternal relatives  . Stroke Maternal Grandmother   . Diabetes Paternal Grandmother     SOCIAL HISTORY:  Social History   Social History  . Marital Status: Married    Spouse Name: N/A  . Number of Children: N/A  . Years of Education: N/A   Occupational History  . GIS Analyst    Social History Main Topics  . Smoking status: Former Games developer  . Smokeless tobacco: Not on file  . Alcohol Use: 0.0 oz/week    0 Standard drinks or equivalent per week  . Drug Use: No  . Sexual Activity: Not on file   Other Topics Concern    . Not on file   Social History Narrative   Drinks about 12-16oz of coffee a day, occasionally drinks a tea      PHYSICAL EXAM  GENERAL EXAM/CONSTITUTIONAL: Vitals:  Filed Vitals:   03/23/15 1513  BP: 127/80  Pulse: 88  Height:  (1.6 m)  Weight: 213 lb (96.616 kg)   Body mass index is 37.74 kg/(m^2). No exam data present  Patient is SLIGHT UNCOMFORTABLE APPEARING; well developed, nourished and groomed; SLIGHT NECK STIFFNESS  CARDIOVASCULAR:  Examination of carotid arteries is normal; no carotid bruits  Regular rate and rhythm, no murmurs  Examination of peripheral vascular system by observation and palpation is normal  EYES:  Ophthalmoscopic exam of optic  discs and posterior segments is normal; no papilledema or hemorrhages  MUSCULOSKELETAL:  Gait, strength, tone, movements noted in Neurologic exam below  NEUROLOGIC: MENTAL STATUS:  No flowsheet data found.  awake, alert, oriented to person, place and time  recent and remote memory intact  normal attention and concentration  language fluent, comprehension intact, naming intact,   fund of knowledge appropriate  CRANIAL NERVE:   2nd - no papilledema on fundoscopic exam  2nd, 3rd, 4th, 6th - pupils equal and reactive to light, visual fields full to confrontation, extraocular muscles intact, no nystagmus  5th - facial sensation symmetric  7th - facial strength symmetric  8th - hearing intact  9th - palate elevates symmetrically, uvula midline  11th - shoulder shrug symmetric  12th - tongue protrusion midline  MOTOR:   normal bulk and tone, full strength in the BUE, BLE  SENSORY:   normal and symmetric to light touch, temperature, vibration  COORDINATION:   finger-nose-finger, fine finger movements normal  REFLEXES:   deep tendon reflexes present and symmetric  GAIT/STATION:   narrow based gait; able to walk tandem; romberg is negative    DIAGNOSTIC DATA (LABS, IMAGING,  TESTING) - I reviewed patient records, labs, notes, testing and imaging myself where available.  Lab Results  Component Value Date   WBC 6.6 12/16/2014   HGB 11.8* 12/16/2014   HCT 36.5 12/16/2014   MCV 86.9 12/16/2014   PLT 328 12/16/2014      Component Value Date/Time   NA 136 11/03/2014 1146   K 4.5 11/03/2014 1146   CL 102 11/03/2014 1146   CO2 26 11/03/2014 1146   GLUCOSE 83 11/03/2014 1146   BUN 5* 11/03/2014 1146   CREATININE 0.63 11/03/2014 1146   CREATININE 0.72 07/09/2013 1615   CALCIUM 8.9 11/03/2014 1146   PROT 7.0 12/16/2014 0932   ALBUMIN 4.3 12/16/2014 0932   AST 22 12/16/2014 0932   ALT 22 12/16/2014 0932   ALKPHOS 39 12/16/2014 0932   BILITOT 0.9 12/16/2014 0932   GFRNONAA >89 10/14/2014 0944   GFRNONAA >90 07/09/2013 1615   GFRAA >89 10/14/2014 0944   GFRAA >90 07/09/2013 1615   Lab Results  Component Value Date   CHOL 175 12/16/2014   HDL 47 12/16/2014   LDLCALC 101 12/16/2014   TRIG 136 12/16/2014   CHOLHDL 3.7 12/16/2014   Lab Results  Component Value Date   HGBA1C 5.3 12/16/2014   No results found for: [id number] Lab Results  Component Value Date   TSH 1.306 12/16/2014    10/22/14 MRI thoracic spine [I reviewed images myself and agree with interpretation. -VRP]  - Multilevel mild disc degeneration without definitive impingement. - Multilevel mild to moderate left foraminal stenoses are noted above.  11/27/14 MRI lumbar spine [I reviewed images myself and agree with interpretation. -VRP]  - Disc bulging and facet hypertrophy L4-5 with mild to moderate spinal stenosis. - Large central disc protrusion L5-S1 with possible extruded disc fragments. There is severe spinal stenosis with compression of the thecal sac and S1 nerve roots bilaterally.   09/26/14 CT head [I reviewed images myself and agree with interpretation. -VRP]  - Stable and normal noncontrast CT appearance of the brain.  01/20/15 MRI brain  - normal    ASSESSMENT AND  PLAN  39 y.o. year old female here with history of recurrent aseptic/viral meningitis since 2008-2013, also with history of migraine type headaches since age 59 years old, never officially diagnosed. Also with recurrent migraine attacks.  Dx:    Mollaret's meningitis  Migraine without aura and without status migrainosus, not intractable    PLAN: - increase topiramate to 50 / 100 - continue rizatriptan prn  Meds ordered this encounter  Medications  . topiramate (TOPAMAX) 50 MG tablet    Sig: Take 1-2 tablets (50-100 mg total) by mouth 2 (two) times daily.    Dispense:  120 tablet    Refill:  12  . rizatriptan (MAXALT-MLT) 10 MG disintegrating tablet    Sig: Take 1 tablet (10 mg total) by mouth as needed for migraine. May repeat in 2 hours if needed    Dispense:  9 tablet    Refill:  11   Return in about 3 months (around 06/21/2015).    Suanne Marker, MD 03/23/2015, 4:02 PM Certified in Neurology, Neurophysiology and Neuroimaging  [facility] [address] [place], [place] [address] [phone]

## 2015-03-23 NOTE — Patient Instructions (Signed)
Thank you for coming to see [place] at [facility]. I hope we have been able to provide you high quality care today.  You may receive a patient satisfaction survey over the next few weeks. We would appreciate your feedback and comments so that we may continue to improve ourselves and the health of our patients.  - increase topiramate to 81m in AM and 1043min PM - continue rizatriptan - start headache log / journal   ~~~~~~~~~~~~~~~~~~~~~~~~~~~~~~~~~~~~~~~~~~~~~~~~~~~~~~~~~~~~~~~~~  DR. Rhiley Solem'S GUIDE TO HAPPY AND HEALTHY LIVING These are some of my general health and wellness recommendations. Some of them may apply to you better than others. Please use common sense as you try these suggestions and feel free to ask me any questions.   ACTIVITY/FITNESS Mental, social, emotional and physical stimulation are very important for brain and body health. Try learning a new activity (arts, music, language, sports, games).  Keep moving your body to the best of your abilities. You can do this at home, inside or outside, the park, community center, gym or anywhere you like. Consider a physical therapist or personal trainer to get started. Consider the app Sworkit. Fitness trackers such as smart-watches, smart-phones or Fitbits can help as well.   NUTRITION Eat more plants: colorful vegetables, nuts, seeds and berries.  Eat less sugar, salt, preservatives and processed foods.  Avoid toxins such as cigarettes and alcohol.  Drink water when you are thirsty. Warm water with a slice of lemon is an excellent morning drink to start the day.  Consider these websites for more information The Nutrition Source (hthttps://www.henry-hernandez.biz/[employer] Nutrition (wwWindowBlog.ch  RELAXATION Consider practicing mindfulness meditation or other relaxation techniques such as deep breathing, prayer, yoga, tai chi, massage. See website mindful.org  or the apps Headspace or Calm to help get started.   SLEEP Try to get at least 7-8+ hours sleep per day. Regular exercise and reduced caffeine will help you sleep better. Practice good sleep hygeine techniques. See website sleep.org for more information.   PLANNING Prepare estate planning, living will, healthcare POA documents. Sometimes this is best planned with the help of an attorney. Theconversationproject.org and agingwithdignity.org are excellent resources.

## 2015-04-23 ENCOUNTER — Encounter: Payer: Self-pay | Admitting: Family Medicine

## 2015-04-28 ENCOUNTER — Encounter: Payer: Self-pay | Admitting: Family Medicine

## 2015-05-02 ENCOUNTER — Ambulatory Visit (INDEPENDENT_AMBULATORY_CARE_PROVIDER_SITE_OTHER): Payer: Commercial Managed Care - HMO | Admitting: Internal Medicine

## 2015-05-02 VITALS — BP 130/72 | HR 90 | Temp 98.5°F | Resp 17 | Ht 64.57 in | Wt 214.0 lb

## 2015-05-02 DIAGNOSIS — M79674 Pain in right toe(s): Secondary | ICD-10-CM | POA: Diagnosis not present

## 2015-05-02 DIAGNOSIS — L600 Ingrowing nail: Secondary | ICD-10-CM | POA: Diagnosis not present

## 2015-05-02 NOTE — Patient Instructions (Signed)
Fingernail or Toenail Removal, Care After Refer to this sheet in the next few weeks. These instructions provide you with information about caring for yourself after your procedure. Your health care provider may also give you more specific instructions. Your treatment has been planned according to current medical practices, but problems sometimes occur. Call your health care provider if you have any problems or questions after your procedure. WHAT TO EXPECT AFTER THE PROCEDURE After your procedure, it is common to have:  Redness.  Swelling. HOME CARE INSTRUCTIONS  If you have a splint on your finger:  Wear it as directed by your health care provider. Remove it only as directed by your health care provider.  Loosen the splint if your fingers become numb and tingle, or if they turn cold and blue.  If you were given a surgical shoe, wear it as directed by your health care provider.  Take medicines only as directed by your health care provider.  Elevate your hand or foot as much of the time as possible. This helps with pain and swelling.  If you are recovering from fingernail removal, keep your hand raised above the level of your heart.  If you are recovering from toenail removal, lie on a bed or a couch with your leg propped up on pillows, or sit in a reclining chair with the footrest up.  Follow instructions from your health care provider about bandage (dressing) changes and removal:  Change your dressing 24 hours after your procedure or as directed by your health care provider.  Soak your hand or foot in warm, soapy water for 10-20 minutes or as directed by your health care provider. Do this 3 times per day or as directed by your health care provider. This reduces pain and swelling.  After you soak your hand or foot, apply a clean, dry dressing.  Keep your dressing clean and dry. Change your dressing whenever it gets wet or dirty.  Keep all follow-up visits as directed by your  health care provider. This is important. SEEK MEDICAL CARE IF:  You have increased redness or pain at your nail area.  You have increased fluid, blood, or pus coming from your nail area.  There is a bad smell coming from the dressing.  You have a fever.  Your swelling gets worse, or you have swelling that spreads from your finger to your hand or from your toe to your foot.  You have worsening redness that spreads from your finger to your hand or from your toe up to your foot.  Your finger or toe looks blue or black.   This information is not intended to replace advice given to you by your health care provider. Make sure you discuss any questions you have with your health care provider.   Document Released: 04/10/2014 Document Reviewed: 04/10/2014 Elsevier Interactive Patient Education 2016 Elsevier Inc.  

## 2015-05-02 NOTE — Progress Notes (Signed)
PROCEDURE NOTE: Ingrown Toenail Removal  Verbal Consent Obtained. PA-S assisted in the procedure. Right great toe nail wiped with alcohol prep pad, then digital block with 7cc of 0.5% Marcaine. Sterile prep and drape. Right medial great toe nail lifted and excised, middle and lateral aspect of right great toe nail left intact.  Proximal aspect of nail bed explored revealing no nail remnants. Ingrown tissue debrided. No active bleeding. Xeroform dressing applied. Cleansed and dressed. Wound care instructions including precautions reviewed with patient.

## 2015-05-02 NOTE — Progress Notes (Signed)
   Subjective:    [name], female    DOB: [birth year], 40 y.o.   MRN: [id number] By signing my name below, I, Javier Docker, attest that this documentation has been prepared under the direction and in the presence of Ellamae Sia, MD. Electronically Signed: Javier Docker, ER Scribe. 05/02/2015. 12:08 PM.  Chief Complaint  Patient presents with  . ingrown toe nail    HPI HPI Comments: Angela Farrell is a 40 y.o. female who presents to [facility] complaining of an ingrown toenail on the right foot. Hurts to walk 1 week. She had the toenail edge removed one year ago due an ingrown toenail.   Past Medical History  Diagnosis Date  . Anxiety   . Meningitis, viral At age 16 and at age 14  . Other and unspecified ovarian cysts 2011- pelvic CT    GYN- Dr. Chevis Pretty  . Anemia   . Chronic headache disorder     s/p Meningitis x 2, tension- type plus allergic/sinus problems  . History of abnormal Pap smear   . Nasal fracture 2011  . Arthritis   . Allergy   . Vision abnormalities    No Known Allergies    Review of Systems  Constitutional: Negative for chills and fatigue.  Musculoskeletal: Positive for gait problem. Negative for arthralgias.       Objective:  BP 130/72 mmHg  Pulse 90  Temp(Src) 98.5 F (36.9 C) (Oral)  Resp 17  Ht 5' 4.57" (1.64 m)  Wt 214 lb (97.07 kg)  BMI 36.09 kg/m2  SpO2 98%  Physical Exam  Constitutional: She is oriented to person, place, and time. She appears well-developed and well-nourished. No distress.  HENT:  Head: Normocephalic and atraumatic.  Eyes: Pupils are equal, round, and reactive to light.  Neck: Neck supple.  Cardiovascular: Normal rate.   Pulmonary/Chest: Effort normal. No respiratory distress.  Musculoskeletal: Normal range of motion.  Neurological: She is alert and oriented to person, place, and time. Coordination normal.  Skin: Skin is warm and dry. She is not diaphoretic.  R great toe with both edges ingrown  but only swollen and painful on the medial aspect--not red/no pus  Psychiatric: She has a normal mood and affect. Her behavior is normal.  Nursing note and vitals reviewed. procedure per [facility]    Assessment & Plan:  Ingrown nail-recurrence --wedge excision --prevention methods discussed  I have completed the patient encounter in its entirety as documented by the scribe, with editing by me where necessary. Izza Bickle P. Merla Riches, M.D.

## 2015-05-05 ENCOUNTER — Ambulatory Visit (INDEPENDENT_AMBULATORY_CARE_PROVIDER_SITE_OTHER): Payer: Commercial Managed Care - HMO | Admitting: Podiatry

## 2015-05-05 ENCOUNTER — Encounter: Payer: Self-pay | Admitting: Podiatry

## 2015-05-05 VITALS — BP 114/72 | HR 101 | Resp 16 | Ht 63.0 in | Wt 206.0 lb

## 2015-05-05 DIAGNOSIS — L600 Ingrowing nail: Secondary | ICD-10-CM | POA: Diagnosis not present

## 2015-05-05 MED ORDER — NEOMYCIN-POLYMYXIN-HC 3.5-10000-1 OT SOLN: OTIC | Status: DC

## 2015-05-05 NOTE — Progress Notes (Signed)
Subjective:     [name], female   DOB: 08/24/1975, 40 y.o.   MRN: [id number]  HPI patient presents stating I have had problems with ingrown toenail my right big toe several times and I been to urgent care but they've not done anything to prevent it from reoccurring. It hurts on both sides at different times and the only worked on one side   Review of Systems  All other systems reviewed and are negative.      Objective:   Physical Exam  Constitutional: She is oriented to person, place, and time.  Cardiovascular: Intact distal pulses.   Musculoskeletal: Normal range of motion.  Neurological: She is oriented to person, place, and time.  Skin: Skin is warm.  Nursing note and vitals reviewed.  neurovascular status intact muscle strength adequate range of motion within normal limits with patient found to have incurvated right hallux medial lateral border and a procedure on the medial border which appears to be healing well but was nonpermanent in nature     Assessment:      chronic ingrown toenail right hallux which has not been fixed permanently and patient desires permanent procedure    Plan:      H&P and condition reviewed with patient and husband. I've recommended removal of the corners and I explained procedure and risk and the fact will drain for several weeks. Patient wants procedure and today I infiltrated 60 mg Xylocaine Marcaine mixture and remove the medial lateral borders exposing matrix and applying phenol 3 applications 30 seconds followed by alcohol lavage and sterile dressing. Gave instructions on soaks and reappoint

## 2015-05-05 NOTE — Patient Instructions (Signed)

## 2015-05-05 NOTE — Progress Notes (Signed)
   Subjective:    [name], female    DOB: 04/10/1975, 40 y.o.   MRN: [id number]  HPI Patient presents with an ingrown toenail in their right foot, great toe-medial; great. Pt stated, "Had ingrown taken care of on Sunday, Jan. 30, 2017 at Urgent Care"; Needs nail checked to make did correctly.  Pt also stated, "Doctor thought had an ingrown on both sides of great toe". Only the medial side was done. Both sides of the nail need to be checked.   Review of Systems  HENT: Positive for sinus pressure.   Neurological: Positive for headaches.  All other systems reviewed and are negative.      Objective:   Physical Exam        Assessment & Plan:

## 2015-05-12 ENCOUNTER — Telehealth: Payer: Self-pay | Admitting: *Deleted

## 2015-05-12 NOTE — Telephone Encounter (Signed)
Left message for patient at [phone] (Home #) on Tuesday, May 11, 2015 to check to see how they were doing from their ingrown toenail procedure that was performed on Wednesday, May 05, 2015. Waiting for a response.

## 2015-06-21 ENCOUNTER — Encounter: Payer: Self-pay | Admitting: Diagnostic Neuroimaging

## 2015-06-21 ENCOUNTER — Ambulatory Visit (INDEPENDENT_AMBULATORY_CARE_PROVIDER_SITE_OTHER): Payer: Commercial Managed Care - HMO | Admitting: Diagnostic Neuroimaging

## 2015-06-21 VITALS — BP 134/88 | HR 91 | Ht 63.0 in | Wt 213.6 lb

## 2015-06-21 DIAGNOSIS — G032 Benign recurrent meningitis [Mollaret]: Secondary | ICD-10-CM | POA: Diagnosis not present

## 2015-06-21 DIAGNOSIS — G43009 Migraine without aura, not intractable, without status migrainosus: Secondary | ICD-10-CM

## 2015-06-21 MED ORDER — TOPIRAMATE 50 MG PO TABS: 50.0000 mg | ORAL_TABLET | Freq: Two times a day (BID) | ORAL | Status: DC

## 2015-06-21 NOTE — Progress Notes (Signed)
[facility]  PATIENT: Angela Farrell DOB: 1976-03-09  REFERRING CLINICIAN: Copeland HISTORY FROM: patient and husband  REASON FOR VISIT: follow up   HISTORICAL  CHIEF COMPLAINT:  Chief Complaint  Patient presents with  . Mollaret's meningitis    rm 7, husband -[name], "HA today in forehead, last really bad HA 06/05/15- took Maxalt, stress played role; working more hrs lately"  . Follow-up    3 month    HISTORY OF PRESENT ILLNESS:   UPDATE 06/21/15: Since last visit, was doing well with higher TPX, but now more work stress, and more HA since March 2017.  UPDATE 03/23/15: Since last visit, was doing better with TPX + rizatriptan; then had back surgery Feb 09, 2015; then Dec 2016 having more HA.  PRIOR HPI (12/25/14): 40 year old ambidextrous female here for evaluation of headaches. 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 Patient has history of headaches starting in childhood around 40 years old. She describes frontal, eye pressure, throbbing severe headaches with neck pain, photophobia, phonophobia and occasionally nausea. Headaches can last hours to days at a time. Triggers include stress. Over the years headaches varied in frequency and severity. More recently headaches have been occurring roughly 4-6 days per month. Patient has family history of cluster headache in her father. Patient herself has never been diagnosed with migraine headaches or tried migraine medications. Patient had been hospitalized several times for headache, light sensitivity, neck pain, fevers, with testing and lumbar puncture results demonstrating viral/aseptic meningitis. 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 On the basis of this patient was diagnosed with recurrent meningitis (Mollaret's) in 2013. Since that time she has been prescribed valacyclovir 500 mg twice a day to be taken during times of recurrence of symptoms. She uses this 6-8 months out  of the year, 3-4 days at a time. This has helped keep some of her symptoms under control. Her last hospitalization was in 2013 for meningitis. More recently, last night patient had recurrence of headache. She had some nausea and vomiting last night. This morning and during office visit patient is suffering with severe headache, sensitivity to light, neck pain. Patient states that her current symptoms feel slightly similar to her prior meningitis attacks but not as severe. Patient has not started valacyclovir yet for this attack.   REVIEW OF SYSTEMS: Full 14 system review of systems performed and notable only for memory loss confusion headache insomnia sleepiness snoring anxiety not asleep aching muscles joint pain flushing anemia.  ALLERGIES: No Known Allergies  HOME MEDICATIONS: Outpatient Prescriptions Prior to Visit  Medication Sig Dispense Refill  . etonogestrel (NEXPLANON) 68 MG IMPL implant Inject 1 each into the skin once.    . fluticasone (FLONASE) 50 MCG/ACT nasal spray Place 2 sprays into both nostrils daily. 16 g 6  . neomycin-polymyxin-hydrocortisone (CORTISPORIN) otic solution Apply 1-2 drops to toe after soaking BID 10 mL 1  . rizatriptan (MAXALT-MLT) 10 MG disintegrating tablet Take 1 tablet (10 mg total) by mouth as needed for migraine. May repeat in 2 hours if needed 9 tablet 11  . topiramate (TOPAMAX) 50 MG tablet Take 1-2 tablets (50-100 mg total) by mouth 2 (two) times daily. 120 tablet 12  . valACYclovir (VALTREX) 500 MG tablet Take 500 mg by mouth 2 (two) times daily. Reported on 05/02/2015    . diazepam (VALIUM) 5 MG tablet TAKE 1 TABLET BY MOUTH EVERY 6 HOURS AS NEEDED FOR MUSCLE SPASM  1  .  gabapentin (NEURONTIN) 100 MG capsule Take 600 mg by mouth 3 (three) times daily.     . traMADol (ULTRAM) 50 MG tablet Take by mouth every 6 (six) hours as needed.     Facility-Administered Medications Prior to Visit  Medication Dose Route Frequency Provider Last Rate Last Dose  .  gadopentetate dimeglumine (MAGNEVIST) injection 15 mL  15 mL Intravenous Once PRN Suanne MarkerVikram R Neita Landrigan, MD        PAST MEDICAL HISTORY: Past Medical History  Diagnosis Date  . Anxiety   . Meningitis, viral At age 40 and at age 40  . Other and unspecified ovarian cysts 2011- pelvic CT    GYN- Dr. Chevis PrettyMezer  . Anemia   . Chronic headache disorder     s/p Meningitis x 2, tension- type plus allergic/sinus problems  . History of abnormal Pap smear   . Nasal fracture 2011  . Arthritis   . Allergy   . Vision abnormalities     PAST SURGICAL HISTORY: Past Surgical History  Procedure Laterality Date  . Foot surgery  s/p 2 procedures  . Laparoscopy for ectopic pregnancy  1998    Fallopian tube removed  . Uterine artery embolization    . Mouth surgery    . Back surgery  02/09/15    L5 - S1 diskectomy  . Spinal injection  03/10/15    FAMILY HISTORY: Family History  Problem Relation Age of Onset  . Migraines Father     cluster headache  . Hyperlipidemia Father   . Hypertension Father   . Cancer Mother   . Diabetes      paternal relaitve  . Seizures      maternal relative  . Rheum arthritis      maternal relatives  . Stroke Maternal Grandmother   . Diabetes Paternal Grandmother     SOCIAL HISTORY:  Social History   Social History  . Marital Status: Married    Spouse Name: Jesusita OkaDan  . Number of Children: N/A  . Years of Education: N/A   Occupational History  . GIS Analyst    Social History Main Topics  . Smoking status: Former Games developermoker  . Smokeless tobacco: Not on file  . Alcohol Use: 0.0 oz/week    0 Standard drinks or equivalent per week  . Drug Use: No  . Sexual Activity: Not on file   Other Topics Concern  . Not on file   Social History Narrative   Drinks about 12-16oz of coffee a day, occasionally drinks a tea      PHYSICAL EXAM  GENERAL EXAM/CONSTITUTIONAL: Vitals:  Filed Vitals:   06/21/15 1552  BP: 134/88  Pulse: 91  Height: 5\' 3"  (1.6 m)  Weight: 213  lb 9.6 oz (96.888 kg)   Body mass index is 37.85 kg/(m^2). No exam data present  Patient is well developed, nourished and groomed  CARDIOVASCULAR:  Examination of carotid arteries is normal; no carotid bruits  Regular rate and rhythm, no murmurs  Examination of peripheral vascular system by observation and palpation is normal  EYES:  Ophthalmoscopic exam of optic discs and posterior segments is normal; no papilledema or hemorrhages  MUSCULOSKELETAL:  Gait, strength, tone, movements noted in Neurologic exam below  NEUROLOGIC: MENTAL STATUS:  No flowsheet data found.  awake, alert, oriented to person, place and time  recent and remote memory intact  normal attention and concentration  language fluent, comprehension intact, naming intact,   fund of knowledge appropriate  CRANIAL NERVE:   2nd -  no papilledema on fundoscopic exam  2nd, 3rd, 4th, 6th - pupils equal and reactive to light, visual fields full to confrontation, extraocular muscles intact, no nystagmus  5th - facial sensation symmetric  7th - facial strength symmetric  8th - hearing intact  9th - palate elevates symmetrically, uvula midline  11th - shoulder shrug symmetric  12th - tongue protrusion midline  MOTOR:   normal bulk and tone, full strength in the BUE, BLE  SENSORY:   normal and symmetric to light touch, temperature, vibration  COORDINATION:   finger-nose-finger, fine finger movements normal  REFLEXES:   deep tendon reflexes present and symmetric  GAIT/STATION:   narrow based gait; romberg is negative    DIAGNOSTIC DATA (LABS, IMAGING, TESTING) - I reviewed patient records, labs, notes, testing and imaging myself where available.  Lab Results  Component Value Date   WBC 6.6 12/16/2014   HGB 11.8* 12/16/2014   HCT 36.5 12/16/2014   MCV 86.9 12/16/2014   PLT 328 12/16/2014      Component Value Date/Time   NA 136 11/03/2014 1146   K 4.5 11/03/2014 1146   CL 102  11/03/2014 1146   CO2 26 11/03/2014 1146   GLUCOSE 83 11/03/2014 1146   BUN 5* 11/03/2014 1146   CREATININE 0.63 11/03/2014 1146   CREATININE 0.72 07/09/2013 1615   CALCIUM 8.9 11/03/2014 1146   PROT 7.0 12/16/2014 0932   ALBUMIN 4.3 12/16/2014 0932   AST 22 12/16/2014 0932   ALT 22 12/16/2014 0932   ALKPHOS 39 12/16/2014 0932   BILITOT 0.9 12/16/2014 0932   GFRNONAA >89 10/14/2014 0944   GFRNONAA >90 07/09/2013 1615   GFRAA >89 10/14/2014 0944   GFRAA >90 07/09/2013 1615   Lab Results  Component Value Date   CHOL 175 12/16/2014   HDL 47 12/16/2014   LDLCALC 101 12/16/2014   TRIG 136 12/16/2014   CHOLHDL 3.7 12/16/2014   Lab Results  Component Value Date   HGBA1C 5.3 12/16/2014   No results found for: [id number] Lab Results  Component Value Date   TSH 1.306 12/16/2014    10/22/14 MRI thoracic spine [I reviewed images myself and agree with interpretation. -VRP]  - Multilevel mild disc degeneration without definitive impingement. - Multilevel mild to moderate left foraminal stenoses are noted above.  11/27/14 MRI lumbar spine [I reviewed images myself and agree with interpretation. -VRP]  - Disc bulging and facet hypertrophy L4-5 with mild to moderate spinal stenosis. - Large central disc protrusion L5-S1 with possible extruded disc fragments. There is severe spinal stenosis with compression of the thecal sac and S1 nerve roots bilaterally.   09/26/14 CT head [I reviewed images myself and agree with interpretation. -VRP]  - Stable and normal noncontrast CT appearance of the brain.  01/20/15 MRI brain  - normal    ASSESSMENT AND PLAN  40 y.o. year old female here with history of recurrent aseptic/viral meningitis since 2008-2013, also with history of migraine type headaches since age 30 years old, never officially diagnosed. Also with recurrent migraine attacks.   Dx:    Mollaret's meningitis  Migraine without aura and without status migrainosus, not  intractable    PLAN: - increase topiramate to  BID - continue rizatriptan prn - continue exercise / PT regimen  Meds ordered this encounter  Medications  . topiramate (TOPAMAX) 50 MG tablet    Sig: Take 1-2 tablets (50-100 mg total) by mouth 2 (two) times daily.    Dispense:  120 tablet  Refill:  12   Return in about 3 months (around 09/21/2015).    Suanne Marker, MD 06/21/2015, 4:11 PM Certified in Neurology, Neurophysiology and Neuroimaging  [facility] [address] [place], [place] [address] [phone]

## 2015-06-21 NOTE — Patient Instructions (Addendum)
Thank you for coming to see [place] at [facility]. I hope we have been able to provide you high quality care today.  You may receive a patient satisfaction survey over the next few weeks. We would appreciate your feedback and comments so that we may continue to improve ourselves and the health of our patients.  - increase topiramate to 130m twice a day - gradually increase physical activity    ~~~~~~~~~~~~~~~~~~~~~~~~~~~~~~~~~~~~~~~~~~~~~~~~~~~~~~~~~~~~~~~~~  DR. PENUMALLI'S GUIDE TO HAPPY AND HEALTHY LIVING These are some of my general health and wellness recommendations. Some of them may apply to you better than others. Please use common sense as you try these suggestions and feel free to ask me any questions.   ACTIVITY/FITNESS Mental, social, emotional and physical stimulation are very important for brain and body health. Try learning a new activity (arts, music, language, sports, games).  Keep moving your body to the best of your abilities. You can do this at home, inside or outside, the park, community center, gym or anywhere you like. Consider a physical therapist or personal trainer to get started. Consider the app Sworkit. Fitness trackers such as smart-watches, smart-phones or Fitbits can help as well.   NUTRITION Eat more plants: colorful vegetables, nuts, seeds and berries.  Eat less sugar, salt, preservatives and processed foods.  Avoid toxins such as cigarettes and alcohol.  Drink water when you are thirsty. Warm water with a slice of lemon is an excellent morning drink to start the day.  Consider these websites for more information The Nutrition Source (hhttps://www.henry-hernandez.biz/ Prec[employer] Nutrition (wWindowBlog.ch   RELAXATION Consider practicing mindfulness meditation or other relaxation techniques such as deep breathing, prayer, yoga, tai chi, massage. See website mindful.org or the apps Headspace  or Calm to help get started.   SLEEP Try to get at least 7-8+ hours sleep per day. Regular exercise and reduced caffeine will help you sleep better. Practice good sleep hygeine techniques. See website sleep.org for more information.   PLANNING Prepare estate planning, living will, healthcare POA documents. Sometimes this is best planned with the help of an attorney. Theconversationproject.org and agingwithdignity.org are excellent resources.

## 2015-08-09 ENCOUNTER — Encounter: Payer: Self-pay | Admitting: Podiatry

## 2015-08-09 ENCOUNTER — Ambulatory Visit (INDEPENDENT_AMBULATORY_CARE_PROVIDER_SITE_OTHER): Payer: Commercial Managed Care - HMO | Admitting: Podiatry

## 2015-08-09 VITALS — BP 151/97 | HR 95 | Resp 16

## 2015-08-09 DIAGNOSIS — L600 Ingrowing nail: Secondary | ICD-10-CM | POA: Diagnosis not present

## 2015-08-09 NOTE — Patient Instructions (Signed)

## 2015-08-10 ENCOUNTER — Telehealth: Payer: Self-pay | Admitting: *Deleted

## 2015-08-10 NOTE — Telephone Encounter (Signed)
Pt's husband, Jesusita OkaDan states pt had an ingrown toenail procedure with Dr. Charlsie Merlesegal yesterday, and is experiencing a great deal of throbbing pain.  Jesusita OkaDan states they have not removed the original procedure dressing.  I instructed [name] to have pt remove the dressing and begin the soaks, rinsing after and applying an antibiotic ointment fabric bandaid, and if able to tolerate take OTC Ibuprofen as package directs, and call again with concerns.  Jesusita OkaDan states understanding.

## 2015-08-11 NOTE — Progress Notes (Signed)
Subjective:     Patient ID: Angela EconomySheila C Paar, female   DOB: [birth year], 40 y.o.   MRN: [id number]  HPI patient states my heel that is remaining is loose and it is starting to bother me. The ingrown toenails have done well   Review of Systems     Objective:   Physical Exam Neurovascular status intact muscle strength adequate with loose hallux nail right with the medial and lateral borders that a been corrected well with no indications of pathology    Assessment:     Damage right hallux nail that's currently loose    Plan:     I explained removal of nail with allowing new nail to regrow explaining that it may give her problems long-term and may require permanent procedure. Today I infiltrated 60 mg Xylocaine Marcaine mixture remove the hallux nail flushed out the bed and applied sterile dressing and instructed on soaks. Reappoint to recheck

## 2015-09-21 ENCOUNTER — Encounter: Payer: Self-pay | Admitting: Diagnostic Neuroimaging

## 2015-09-21 ENCOUNTER — Ambulatory Visit (INDEPENDENT_AMBULATORY_CARE_PROVIDER_SITE_OTHER): Payer: Commercial Managed Care - HMO | Admitting: Diagnostic Neuroimaging

## 2015-09-21 VITALS — BP 106/66 | HR 91 | Wt 214.2 lb

## 2015-09-21 DIAGNOSIS — G43009 Migraine without aura, not intractable, without status migrainosus: Secondary | ICD-10-CM

## 2015-09-21 DIAGNOSIS — G032 Benign recurrent meningitis [Mollaret]: Secondary | ICD-10-CM

## 2015-09-21 MED ORDER — TOPIRAMATE 100 MG PO TABS: 100.0000 mg | ORAL_TABLET | Freq: Two times a day (BID) | ORAL | Status: DC

## 2015-09-21 MED ORDER — RIZATRIPTAN BENZOATE 10 MG PO TBDP: 10.0000 mg | ORAL_TABLET | ORAL | Status: DC | PRN

## 2015-09-21 NOTE — Progress Notes (Signed)
[facility]  PATIENT: Angela EconomySheila C Farrell DOB: 10/17/1975  REFERRING CLINICIAN: [place] HISTORY FROM: patient and husband  REASON FOR VISIT: follow up   HISTORICAL  CHIEF COMPLAINT:  Chief Complaint  Patient presents with  . Mollaret's meningitis    rm 4, husband- [name], "averaging HA 3-4 x week, full-on migraine 1-2 x month; HA since yesterday on right side from neck up, took Excedrin this morning"  . Follow-up    3 month    HISTORY OF PRESENT ILLNESS:   UPDATE 09/21/15: Since last visit, overall stable. Avg 1-2 migraine per month. Avg 3-4 HA per week. Currently with right forehead headache.   UPDATE 06/21/15: Since last visit, was doing well with higher TPX, but now more work stress, and more HA since March 2017.  UPDATE 03/23/15: Since last visit, was doing better with TPX + rizatriptan; then had back surgery Feb 09, 2015; then Dec 2016 having more HA.  PRIOR HPI (12/25/14): 40 year old ambidextrous female here for evaluation of headaches. 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 Patient has history of headaches starting in childhood around 40 years old. She describes frontal, eye pressure, throbbing severe headaches with neck pain, photophobia, phonophobia and occasionally nausea. Headaches can last hours to days at a time. Triggers include stress. Over the years headaches varied in frequency and severity. More recently headaches have been occurring roughly 4-6 days per month. Patient has family history of cluster headache in her father. Patient herself has never been diagnosed with migraine headaches or tried migraine medications. Patient had been hospitalized several times for headache, light sensitivity, neck pain, fevers, with testing and lumbar puncture results demonstrating viral/aseptic meningitis. 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 On the basis of this patient was diagnosed with recurrent meningitis  (Mollaret's) in 2013. Since that time she has been prescribed valacyclovir 500 mg twice a day to be taken during times of recurrence of symptoms. She uses this 6-8 months out of the year, 3-4 days at a time. This has helped keep some of her symptoms under control. Her last hospitalization was in 2013 for meningitis. More recently, last night patient had recurrence of headache. She had some nausea and vomiting last night. This morning and during office visit patient is suffering with severe headache, sensitivity to light, neck pain. Patient states that her current symptoms feel slightly similar to her prior meningitis attacks but not as severe. Patient has not started valacyclovir yet for this attack.   REVIEW OF SYSTEMS: Full 14 system review of systems performed and negative except for: env allergies slight decr concentration anemeia joint pain.    ALLERGIES: No Known Allergies  HOME MEDICATIONS: Outpatient Prescriptions Prior to Visit  Medication Sig Dispense Refill  . etonogestrel (NEXPLANON) 68 MG IMPL implant Inject 1 each into the skin once.    . fluticasone (FLONASE) 50 MCG/ACT nasal spray Place 2 sprays into both nostrils daily. 16 g 6  . ramelteon (ROZEREM) 8 MG tablet Take 8 mg by mouth at bedtime. occas as needed    . rizatriptan (MAXALT-MLT) 10 MG disintegrating tablet Take 1 tablet (10 mg total) by mouth as needed for migraine. May repeat in 2 hours if needed 9 tablet 11  . topiramate (TOPAMAX) 50 MG tablet Take 1-2 tablets (50-100 mg total) by mouth 2 (two) times daily. 120 tablet 12  . valACYclovir (VALTREX) 500 MG tablet Take 500 mg by mouth 2 (two) times daily. Reported on  05/02/2015    . diazepam (VALIUM) 10 MG tablet For a procedure coming up 06/21/15  0  . gabapentin (NEURONTIN) 600 MG tablet Once a day  2  . neomycin-polymyxin-hydrocortisone (CORTISPORIN) otic solution Apply 1-2 drops to toe after soaking BID 10 mL 1   Facility-Administered Medications Prior to Visit    Medication Dose Route Frequency Provider Last Rate Last Dose  . gadopentetate dimeglumine (MAGNEVIST) injection 15 mL  15 mL Intravenous Once PRN Suanne Marker, MD        PAST MEDICAL HISTORY: Past Medical History  Diagnosis Date  . Anxiety   . Meningitis, viral At age 77 and at age 1  . Other and unspecified ovarian cysts 2011- pelvic CT    GYN- Dr. Chevis Pretty  . Anemia   . Chronic headache disorder     s/p Meningitis x 2, tension- type plus allergic/sinus problems  . History of abnormal Pap smear   . Nasal fracture 2011  . Arthritis   . Allergy   . Vision abnormalities     PAST SURGICAL HISTORY: Past Surgical History  Procedure Laterality Date  . Foot surgery  s/p 2 procedures  . Laparoscopy for ectopic pregnancy  1998    Fallopian tube removed  . Uterine artery embolization    . Mouth surgery    . Back surgery  02/09/15    L5 - S1 diskectomy  . Spinal injection  03/10/15    FAMILY HISTORY: Family History  Problem Relation Age of Onset  . Migraines Father     cluster headache  . Hyperlipidemia Father   . Hypertension Father   . Cancer Mother   . Diabetes      paternal relaitve  . Seizures      maternal relative  . Rheum arthritis      maternal relatives  . Stroke Maternal Grandmother   . Diabetes Paternal Grandmother     SOCIAL HISTORY:  Social History   Social History  . Marital Status: Married    Spouse Name: Jesusita Oka  . Number of Children: N/A  . Years of Education: N/A   Occupational History  . GIS Analyst    Social History Main Topics  . Smoking status: Former Games developer  . Smokeless tobacco: Not on file  . Alcohol Use: 0.0 oz/week    0 Standard drinks or equivalent per week  . Drug Use: No  . Sexual Activity: Not on file   Other Topics Concern  . Not on file   Social History Narrative   Drinks about 12-16oz of coffee a day, occasionally drinks a tea      PHYSICAL EXAM  GENERAL EXAM/CONSTITUTIONAL: Vitals:  Filed Vitals:   09/21/15  1605  BP: 106/66  Pulse: 91  Weight: 214 lb 3.2 oz (97.16 kg)   Body mass index is 37.95 kg/(m^2). No exam data present  Patient is well developed, nourished and groomed  CARDIOVASCULAR:  Examination of carotid arteries is normal; no carotid bruits  Regular rate and rhythm, no murmurs  Examination of peripheral vascular system by observation and palpation is normal  EYES:  Ophthalmoscopic exam of optic discs and posterior segments is normal; no papilledema or hemorrhages  MUSCULOSKELETAL:  Gait, strength, tone, movements noted in Neurologic exam below  NEUROLOGIC: MENTAL STATUS:  No flowsheet data found.  awake, alert, oriented to person, place and time  recent and remote memory intact  normal attention and concentration  language fluent, comprehension intact, naming intact,   fund of knowledge  appropriate  CRANIAL NERVE:   2nd - no papilledema on fundoscopic exam  2nd, 3rd, 4th, 6th - pupils equal and reactive to light, visual fields full to confrontation, extraocular muscles intact, no nystagmus  5th - facial sensation symmetric  7th - facial strength symmetric  8th - hearing intact  9th - palate elevates symmetrically, uvula midline  11th - shoulder shrug symmetric  12th - tongue protrusion midline  MOTOR:   normal bulk and tone, full strength in the BUE, BLE  SENSORY:   normal and symmetric to light touch, temperature, vibration  COORDINATION:   finger-nose-finger, fine finger movements normal  REFLEXES:   deep tendon reflexes present and symmetric  GAIT/STATION:   narrow based gait; romberg is negative    DIAGNOSTIC DATA (LABS, IMAGING, TESTING) - I reviewed patient records, labs, notes, testing and imaging myself where available.  Lab Results  Component Value Date   WBC 6.6 12/16/2014   HGB 11.8* 12/16/2014   HCT 36.5 12/16/2014   MCV 86.9 12/16/2014   PLT 328 12/16/2014      Component Value Date/Time   NA 136  11/03/2014 1146   K 4.5 11/03/2014 1146   CL 102 11/03/2014 1146   CO2 26 11/03/2014 1146   GLUCOSE 83 11/03/2014 1146   BUN 5* 11/03/2014 1146   CREATININE 0.63 11/03/2014 1146   CREATININE 0.72 07/09/2013 1615   CALCIUM 8.9 11/03/2014 1146   PROT 7.0 12/16/2014 0932   ALBUMIN 4.3 12/16/2014 0932   AST 22 12/16/2014 0932   ALT 22 12/16/2014 0932   ALKPHOS 39 12/16/2014 0932   BILITOT 0.9 12/16/2014 0932   GFRNONAA >89 10/14/2014 0944   GFRNONAA >90 07/09/2013 1615   GFRAA >89 10/14/2014 0944   GFRAA >90 07/09/2013 1615   Lab Results  Component Value Date   CHOL 175 12/16/2014   HDL 47 12/16/2014   LDLCALC 101 12/16/2014   TRIG 136 12/16/2014   CHOLHDL 3.7 12/16/2014   Lab Results  Component Value Date   HGBA1C 5.3 12/16/2014   No results found for: [id number] Lab Results  Component Value Date   TSH 1.306 12/16/2014    10/22/14 MRI thoracic spine [I reviewed images myself and agree with interpretation. -VRP]  - Multilevel mild disc degeneration without definitive impingement. - Multilevel mild to moderate left foraminal stenoses are noted above.  11/27/14 MRI lumbar spine [I reviewed images myself and agree with interpretation. -VRP]  - Disc bulging and facet hypertrophy L4-5 with mild to moderate spinal stenosis. - Large central disc protrusion L5-S1 with possible extruded disc fragments. There is severe spinal stenosis with compression of the thecal sac and S1 nerve roots bilaterally.   09/26/14 CT head [I reviewed images myself and agree with interpretation. -VRP]  - Stable and normal noncontrast CT appearance of the brain.  01/20/15 MRI brain  - normal    ASSESSMENT AND PLAN  40 y.o. year old female here with history of recurrent aseptic/viral meningitis since 2008-2013, also with history of migraine type headaches since age 25 years old, never officially diagnosed. Also with recurrent migraine attacks.   Dx:    Migraine without aura and without status  migrainosus, not intractable  Mollaret's meningitis    PLAN: - continue topiramate  BID - continue rizatriptan prn - continue exercise / PT regimen  Meds ordered this encounter  Medications  . topiramate (TOPAMAX) 100 MG tablet    Sig: Take 1 tablet (100 mg total) by mouth 2 (two) times daily.  Dispense:  180 tablet    Refill:  4  . rizatriptan (MAXALT-MLT) 10 MG disintegrating tablet    Sig: Take 1 tablet (10 mg total) by mouth as needed for migraine. May repeat in 2 hours if needed    Dispense:  9 tablet    Refill:  11   Return in about 6 months (around 03/22/2016).    Suanne Marker, MD 09/21/2015, 4:20 PM Certified in Neurology, Neurophysiology and Neuroimaging  [facility] [address] [place], [place] [address] [phone]

## 2015-12-20 ENCOUNTER — Ambulatory Visit (INDEPENDENT_AMBULATORY_CARE_PROVIDER_SITE_OTHER): Payer: Commercial Managed Care - HMO | Admitting: Family Medicine

## 2015-12-20 VITALS — BP 118/80 | HR 91 | Temp 98.3°F | Resp 16 | Ht 63.0 in | Wt 210.0 lb

## 2015-12-20 DIAGNOSIS — Z1329 Encounter for screening for other suspected endocrine disorder: Secondary | ICD-10-CM | POA: Diagnosis not present

## 2015-12-20 DIAGNOSIS — Z1389 Encounter for screening for other disorder: Secondary | ICD-10-CM

## 2015-12-20 DIAGNOSIS — Z130 Encounter for screening for diseases of the blood and blood-forming organs and certain disorders involving the immune mechanism: Secondary | ICD-10-CM | POA: Diagnosis not present

## 2015-12-20 DIAGNOSIS — Z0000 Encounter for general adult medical examination without abnormal findings: Secondary | ICD-10-CM

## 2015-12-20 DIAGNOSIS — Z1383 Encounter for screening for respiratory disorder NEC: Secondary | ICD-10-CM | POA: Diagnosis not present

## 2015-12-20 DIAGNOSIS — J302 Other seasonal allergic rhinitis: Secondary | ICD-10-CM

## 2015-12-20 DIAGNOSIS — Z136 Encounter for screening for cardiovascular disorders: Secondary | ICD-10-CM

## 2015-12-20 DIAGNOSIS — R079 Chest pain, unspecified: Secondary | ICD-10-CM | POA: Diagnosis not present

## 2015-12-20 LAB — POCT URINALYSIS DIP (MANUAL ENTRY)
Bilirubin, UA: NEGATIVE
Glucose, UA: NEGATIVE
Leukocytes, UA: NEGATIVE
NITRITE UA: NEGATIVE
PH UA: 7.5
PROTEIN UA: NEGATIVE
RBC UA: NEGATIVE
Spec Grav, UA: 1.015
Urobilinogen, UA: 0.2

## 2015-12-20 LAB — LIPID PANEL
CHOL/HDL RATIO: 5 ratio (ref <=5.0)
Cholesterol: 203 mg/dL — ABNORMAL HIGH (ref 125–200)
HDL: 41 mg/dL — AB (ref >=46)
LDL CALC: 126 mg/dL (ref <130)
TRIGLYCERIDES: 178 mg/dL — AB (ref <150)
VLDL: 36 mg/dL — ABNORMAL HIGH (ref <30)

## 2015-12-20 LAB — CBC
HEMATOCRIT: 36.8 % (ref 35.0–45.0)
HEMOGLOBIN: 12.1 g/dL (ref 11.7–15.5)
MCH: 28.3 pg (ref 27.0–33.0)
MCHC: 32.9 g/dL (ref 32.0–36.0)
MCV: 86.2 fL (ref 80.0–100.0)
MPV: 10.6 fL (ref 7.5–12.5)
Platelets: 325 10*3/uL (ref 140–400)
RBC: 4.27 MIL/uL (ref 3.80–5.10)
RDW: 13.3 % (ref 11.0–15.0)
WBC: 4.8 10*3/uL (ref 3.8–10.8)

## 2015-12-20 LAB — COMPREHENSIVE METABOLIC PANEL
ALBUMIN: 4.4 g/dL (ref 3.6–5.1)
ALK PHOS: 37 U/L (ref 33–115)
ALT: 16 U/L (ref 6–29)
AST: 19 U/L (ref 10–30)
BILIRUBIN TOTAL: 1 mg/dL (ref 0.2–1.2)
BUN: 11 mg/dL (ref 7–25)
CALCIUM: 9.2 mg/dL (ref 8.6–10.2)
CO2: 20 mmol/L (ref 20–31)
Chloride: 109 mmol/L (ref 98–110)
Creat: 0.78 mg/dL (ref 0.50–1.10)
Glucose, Bld: 99 mg/dL (ref 65–99)
POTASSIUM: 4.3 mmol/L (ref 3.5–5.3)
Sodium: 137 mmol/L (ref 135–146)
Total Protein: 7.2 g/dL (ref 6.1–8.1)

## 2015-12-20 LAB — TSH: TSH: 0.98 m[IU]/L

## 2015-12-20 MED ORDER — FLUTICASONE PROPIONATE 50 MCG/ACT NA SUSP: 2.0000 | Freq: Every day | NASAL | 6 refills | Status: DC

## 2015-12-20 MED ORDER — OMEPRAZOLE 40 MG PO CPDR: 40.0000 mg | DELAYED_RELEASE_CAPSULE | Freq: Every day | ORAL | 3 refills | Status: DC

## 2015-12-20 MED ORDER — MELOXICAM 15 MG PO TABS: 15.0000 mg | ORAL_TABLET | Freq: Every day | ORAL | 0 refills | Status: DC

## 2015-12-20 NOTE — Patient Instructions (Addendum)
   IF you received an x-ray today, you will receive an invoice from [facility]. Please contact [facility] at [phone] with questions or concerns regarding your invoice.   IF you received labwork today, you will receive an invoice from [facility]. Please contact [facility] at [phone] with questions or concerns regarding your invoice.   Our billing staff will not be able to assist you with questions regarding bills from these companies.  You will be contacted with the lab results as soon as they are available. The fastest way to get your results is to activate your My Chart account. Instructions are located on the last page of this paperwork. If you have not heard from us regarding the results in 2 weeks, please contact this office.     Keeping You Healthy  Get These Tests 1. Blood Pressure- Have your blood pressure checked once a year by your health care provider.  Normal blood pressure is 120/80. 2. Weight- Have your body mass index (BMI) calculated to screen for obesity.  BMI is measure of body fat based on height and weight.  You can also calculate your own BMI at www.nhlbisupport.com/bmi/. 3. Cholesterol- Have your cholesterol checked every 5 years starting at age 20 then yearly starting at age 45. 4. Chlamydia, HIV, and other sexually transmitted diseases- Get screened every year until age 25, then within three months of each new sexual provider. 5. Pap Test - Every 1-5 years; discuss with your health care provider. 6. Mammogram- Every 1-2 years starting at age 40--50  Take these medicines  Calcium with Vitamin D-Your body needs 1200 mg of Calcium each day and 800-1000 IU of Vitamin D daily.  Your body can only absorb 500 mg of Calcium at a time so Calcium must be taken in 2 or 3 divided doses throughout the day.  Multivitamin with folic acid- Once daily if it is possible for you to become pregnant.  Get these  Immunizations  Gardasil-Series of three doses; prevents HPV related illness such as genital warts and cervical cancer.  Menactra-Single dose; prevents meningitis.  Tetanus shot- Every 10 years.  Flu shot-Every year.  Take these steps 1. Do not smoke-Your healthcare provider can help you quit.  For tips on how to quit go to www.smokefree.gov or call 1-800 QUITNOW. 2. Be physically active- Exercise 5 days a week for at least 30 minutes.  If you are not already physically active, start slow and gradually work up to 30 minutes of moderate physical activity.  Examples of moderate activity include walking briskly, dancing, swimming, bicycling, etc. 3. Breast Cancer- A self breast exam every month is important for early detection of breast cancer.  For more information and instruction on self breast exams, ask your healthcare provider or www.womenshealth.gov/faq/breast-self-exam.cfm. 4. Eat a healthy diet- Eat a variety of healthy foods such as fruits, vegetables, whole grains, low fat milk, low fat cheeses, yogurt, lean meats, poultry and fish, beans, nuts, tofu, etc.  For more information go to www. Thenutritionsource.org 5. Drink alcohol in moderation- Limit alcohol intake to one drink or less per day. Never drink and drive. 6. Depression- Your emotional health is as important as your physical health.  If you're feeling down or losing interest in things you normally enjoy please talk to your healthcare provider about being screened for depression. 7. Dental visit- Brush and floss your teeth twice daily; visit your dentist twice a year. 8. Eye doctor- Get an eye exam at least every   2 years. 9. Helmet use- Always wear a helmet when riding a bicycle, motorcycle, rollerblading or skateboarding. 10. Safe sex- If you may be exposed to sexually transmitted infections, use a condom. 11. Seat belts- Seat belts can save your live; always wear one. 12. Smoke/Carbon Monoxide detectors- These detectors need to  be installed on the appropriate level of your home. Replace batteries at least once a year. 13. Skin cancer- When out in the sun please cover up and use sunscreen 15 SPF or higher. 14. Violence- If anyone is threatening or hurting you, please tell your healthcare provider.        

## 2015-12-20 NOTE — Progress Notes (Signed)
Subjective:  By signing my name below, I, [name], attest that this documentation has been prepared under the direction and in the presence of [name], MD. Electronically Signed: [name], Scribe. 12/20/2015 , 2:19 PM .  Patient was seen in Room 8 .   [name], female    DOB: Dec 15, 1975, 40 y.o.   MRN: [id number] Chief Complaint  Patient presents with  . Annual Exam   HPI Angela Farrell is a 40 y.o. female who [facility] for annual physical. She was seen 1 year prior by Dr. Patsy Lager. She has history of transaminitis from acetaminophen overdose, and chronic headaches. She sees OBGYN for well women care; has nexplanon IUD in place. She was tried on rozerem for sleep and referred to neurology for further evaluation for headaches. Tdap done in 2016.   Patient notes her OBGYN is at [facility]. Her mammograms have been nornal. She receives flu shot at work.   Her screening labs have been normal prior. She takes Vitamin A, C, D, and Super B-complex with folic acid.   She also mentions chest discomfort that started 2 weeks ago, initially thought due to working out. She stopped working out but the discomfort pressure persists. She notes this is more prevalent when she lays down on her chest. She denies any pain when she coughs or takes a deep breath. She denies cough, palpitations or shortness of breath. She denies OTC aleve or advil. She does have prilosec for heart burn but she only took it as needed.   Past Medical History:  Diagnosis Date  . Allergy   . Anemia   . Anxiety   . Arthritis   . Chronic headache disorder    s/p Meningitis x 2, tension- type plus allergic/sinus problems  . History of abnormal Pap smear   . Meningitis, viral At age 41 and at age 6  . Nasal fracture 2011  . Other and unspecified ovarian cysts 2011- pelvic CT   GYN- Dr. Chevis Pretty  . Vision abnormalities    Prior to Admission medications   Medication Sig Start Date End  Date Taking? Authorizing Provider  etonogestrel (NEXPLANON) 68 MG IMPL implant Inject 1 each into the skin once.   Yes Historical Provider, MD  fluticasone (FLONASE) 50 MCG/ACT nasal spray Place 2 sprays into both nostrils daily. 12/16/14  Yes Jessica C Copland, MD  ramelteon (ROZEREM) 8 MG tablet Take 8 mg by mouth at bedtime. occas as needed   Yes Historical Provider, MD  rizatriptan (MAXALT-MLT) 10 MG disintegrating tablet Take 1 tablet (10 mg total) by mouth as needed for migraine. May repeat in 2 hours if needed 09/21/15  Yes Suanne Marker, MD  topiramate (TOPAMAX) 100 MG tablet Take 1 tablet (100 mg total) by mouth 2 (two) times daily. 09/21/15  Yes Suanne Marker, MD  valACYclovir (VALTREX) 500 MG tablet Take 500 mg by mouth 2 (two) times daily. Reported on 05/02/2015   Yes Historical Provider, MD   No Known Allergies   Review of Systems  Constitutional: Positive for activity change. Negative for chills, fatigue, fever and unexpected weight change.  HENT: Positive for postnasal drip, rhinorrhea and sneezing.   Eyes: Positive for discharge.  Respiratory: Negative for cough, shortness of breath and wheezing.   Cardiovascular: Positive for chest pain. Negative for palpitations.  Gastrointestinal: Positive for constipation. Negative for diarrhea, nausea and vomiting.  Musculoskeletal: Positive for arthralgias.  Skin: Negative for rash and wound.  Allergic/Immunologic: Positive for environmental allergies.  Neurological: Negative for dizziness, weakness and headaches.       Objective:   Physical Exam  Constitutional: She is oriented to person, place, and time. She appears well-developed and well-nourished. No distress.  HENT:  Head: Normocephalic and atraumatic.  Right Ear: Tympanic membrane normal.  Left Ear: Tympanic membrane normal.  Nose: Nose normal.  Eyes: EOM are normal. Pupils are equal, round, and reactive to light.  Neck: Neck supple.  Cardiovascular: Normal rate,  regular rhythm and normal heart sounds.   No murmur heard. Pulmonary/Chest: Effort normal and breath sounds normal. No respiratory distress.  Abdominal: Bowel sounds are normal.  Musculoskeletal: Normal range of motion.  Neurological: She is alert and oriented to person, place, and time.  Skin: Skin is warm and dry.  Psychiatric: She has a normal mood and affect. Her behavior is normal.  Nursing note and vitals reviewed.  BP 118/80 (BP Location: Right Arm, Patient Position: Sitting, Cuff Size: Large)   Pulse 91   Temp 98.3 F (36.8 C) (Oral)   Resp 16   Ht 5\' 3"  (1.6 m)   Wt 210 lb (95.3 kg)   SpO2 99%   BMI 37.20 kg/m     Assessment & Plan:   1. Annual physical exam   2. Screening for cardiovascular, respiratory, and genitourinary diseases   3. Screening for deficiency anemia   4. Screening for thyroid disorder   5. Other seasonal allergic rhinitis   6. Chest pain at rest     Orders Placed This Encounter  Procedures  . TSH  . VITAMIN D 25 Hydroxy (Vit-D Deficiency, Fractures)  . CBC  . Comprehensive metabolic panel    Order Specific Question:   Has the patient fasted?    Answer:   Yes  . Lipid panel    Order Specific Question:   Has the patient fasted?    Answer:   Yes  . POCT urinalysis dipstick    Meds ordered this encounter  Medications  . meloxicam (MOBIC) 15 MG tablet    Sig: Take 1 tablet (15 mg total) by mouth daily.    Dispense:  30 tablet    Refill:  0  . omeprazole (PRILOSEC) 40 MG capsule    Sig: Take 1 capsule (40 mg total) by mouth daily.    Dispense:  30 capsule    Refill:  3  . fluticasone (FLONASE) 50 MCG/ACT nasal spray    Sig: Place 2 sprays into both nostrils daily.    Dispense:  16 g    Refill:  6    I personally performed the services described in this documentation, which was scribed in my presence. The recorded information has been reviewed and considered, and addended by me as needed.   Norberto SorensonEva Caroll Weinheimer, M.D.  [facility]  [facility] [address] [place], [place] [address] [phone] phone [phone] fax  01/02/16 9:48 PM

## 2015-12-21 LAB — VITAMIN D 25 HYDROXY (VIT D DEFICIENCY, FRACTURES): Vit D, 25-Hydroxy: 31 ng/mL (ref 30–100)

## 2016-01-17 ENCOUNTER — Other Ambulatory Visit: Payer: Self-pay | Admitting: Family Medicine

## 2016-01-18 NOTE — Telephone Encounter (Signed)
Dr Clelia CroftShaw, you saw pt 9/18 and Rxd meloxicam but didn't give RFs. Do you want to?

## 2016-02-11 ENCOUNTER — Encounter: Payer: Self-pay | Admitting: Diagnostic Neuroimaging

## 2016-02-29 ENCOUNTER — Ambulatory Visit (INDEPENDENT_AMBULATORY_CARE_PROVIDER_SITE_OTHER): Payer: Commercial Managed Care - HMO | Admitting: Physician Assistant

## 2016-02-29 ENCOUNTER — Ambulatory Visit (INDEPENDENT_AMBULATORY_CARE_PROVIDER_SITE_OTHER): Payer: Commercial Managed Care - HMO

## 2016-02-29 VITALS — BP 138/90 | HR 78 | Temp 98.4°F | Resp 16 | Ht 63.0 in | Wt 211.0 lb

## 2016-02-29 DIAGNOSIS — R0789 Other chest pain: Secondary | ICD-10-CM | POA: Diagnosis not present

## 2016-02-29 MED ORDER — CYCLOBENZAPRINE HCL 10 MG PO TABS: 5.0000 mg | ORAL_TABLET | Freq: Three times a day (TID) | ORAL | 0 refills | Status: DC | PRN

## 2016-02-29 MED ORDER — PREDNISONE 20 MG PO TABS: ORAL_TABLET | ORAL | 0 refills | Status: AC

## 2016-02-29 NOTE — Patient Instructions (Signed)
     IF you received an x-ray today, you will receive an invoice from [facility]. Please contact [facility] at [phone] with questions or concerns regarding your invoice.   IF you received labwork today, you will receive an invoice from [facility]. Please contact [facility] at [phone] with questions or concerns regarding your invoice.   Our billing staff will not be able to assist you with questions regarding bills from these companies.  You will be contacted with the lab results as soon as they are available. The fastest way to get your results is to activate your My Chart account. Instructions are located on the last page of this paperwork. If you have not heard from us regarding the results in 2 weeks, please contact this office.      

## 2016-02-29 NOTE — Progress Notes (Signed)
02/29/2016 6:20 PM   DOB: Jan 31, 1976 / MRN: [id number]  SUBJECTIVE:  Angela Farrell is a 40 y.o. female presenting for right sided upper extremity pain that has been present for about 3 months. Reports the pain comes and goes and is not getting better or worse. Denies rash, numbness and paresthesia of the right upper extremity. Has a history of lower back surgery due to L5 disk disease. She did try Meloxicam for this and feels that it did not help.  She did take this a few weeks.  She hs a history of tubal ligation.   She has No Known Allergies.   She  has a past medical history of Allergy; Anemia; Anxiety; Arthritis; Chronic headache disorder; History of abnormal Pap smear; Meningitis, viral (At age 40 and at age 40); Nasal fracture (2011); Other and unspecified ovarian cysts (2011- pelvic CT); and Vision abnormalities.    She  reports that she has quit smoking. She does not have any smokeless tobacco history on file. She reports that she drinks alcohol. She reports that she does not use drugs. She  has no sexual activity history on file. The patient  has a past surgical history that includes Foot surgery (s/p 2 procedures); Laparoscopy for ectopic pregnancy (1998); Uterine artery embolization; Mouth surgery; Back surgery (02/09/15); spinal injection (03/10/15); Spine surgery; and Tubal ligation.  Her family history includes Cancer in her mother; Diabetes in her paternal grandmother; Hyperlipidemia in her father; Hypertension in her father; Migraines in her father; Stroke in her maternal grandmother.  Review of Systems  Constitutional: Negative for fever.  HENT: Negative for congestion.   Respiratory: Negative for cough and shortness of breath.   Cardiovascular: Positive for chest pain. Negative for palpitations, orthopnea and leg swelling.  Musculoskeletal: Positive for myalgias.  Skin: Negative for rash.  Neurological: Negative for dizziness.    The problem list and medications were  reviewed and updated by myself where necessary and exist elsewhere in the encounter.   OBJECTIVE:  BP 138/90 (BP Location: Right Arm, Patient Position: Sitting, Cuff Size: Large)   Pulse 78   Temp 98.4 F (36.9 C)   Resp 16   Ht 5\' 3"  (1.6 m)   Wt 211 lb (95.7 kg)   SpO2 99%   BMI 37.38 kg/m   Physical Exam  Cardiovascular: Normal rate and regular rhythm.   Pulmonary/Chest: Effort normal and breath sounds normal. She has no decreased breath sounds. She has no wheezes. She has no rhonchi. She has no rales. She exhibits tenderness. She exhibits no edema, no deformity and no swelling. Right breast exhibits tenderness. Right breast exhibits no inverted nipple, no mass, no nipple discharge and no skin change. Left breast exhibits tenderness. Left breast exhibits no inverted nipple, no mass, no nipple discharge and no skin change.    Musculoskeletal: Normal range of motion.     No results found for this or any previous visit (from the past 72 hour(s)).  Dg Chest 2 View  Result Date: 02/29/2016 CLINICAL DATA:  Chronic chest wall pain EXAM: CHEST  2 VIEW COMPARISON:  06/27/2011 FINDINGS: Normal heart size. Lungs clear. No pneumothorax. No pleural effusion. IMPRESSION: No active cardiopulmonary disease. Electronically Signed   By: Jolaine ClickArthur  Hoss M.D.   On: 02/29/2016 18:17    ASSESSMENT AND PLAN  Angela Farrell was seen today for follow-up.  Diagnoses and all orders for this visit:  Chest wall pain: EKG and rads reassuring. Pain made worse with palpation.   Low risk  for PE and is not really having those symptoms.  -     EKG 12-Lead -     DG Chest 2 View; Future -     Cancel: POCT SEDIMENTATION RATE -     C-reactive protein -     Sedimentation Rate    The patient is advised to call or return to clinic if she does not see an improvement in symptoms, or to seek the care of the closest emergency department if she worsens with the above plan.   Deliah BostonMichael Kordel Leavy, MHS, PA-C [facility] 02/29/2016 6:20 PM

## 2016-03-02 LAB — SEDIMENTATION RATE: Sed Rate: 1 mm/hr (ref 0–20)

## 2016-03-02 LAB — C-REACTIVE PROTEIN: CRP: 3.8 mg/L (ref <8.0)

## 2016-03-13 ENCOUNTER — Ambulatory Visit: Payer: Commercial Managed Care - HMO | Admitting: Diagnostic Neuroimaging

## 2016-04-09 ENCOUNTER — Other Ambulatory Visit: Payer: Self-pay | Admitting: Family Medicine

## 2016-04-12 DIAGNOSIS — Z01419 Encounter for gynecological examination (general) (routine) without abnormal findings: Secondary | ICD-10-CM | POA: Diagnosis not present

## 2016-04-25 ENCOUNTER — Ambulatory Visit (INDEPENDENT_AMBULATORY_CARE_PROVIDER_SITE_OTHER): Payer: Commercial Managed Care - HMO | Admitting: Diagnostic Neuroimaging

## 2016-04-25 ENCOUNTER — Encounter: Payer: Self-pay | Admitting: Diagnostic Neuroimaging

## 2016-04-25 VITALS — BP 124/84 | HR 102 | Wt 212.0 lb

## 2016-04-25 DIAGNOSIS — G43009 Migraine without aura, not intractable, without status migrainosus: Secondary | ICD-10-CM | POA: Diagnosis not present

## 2016-04-25 DIAGNOSIS — G032 Benign recurrent meningitis [Mollaret]: Secondary | ICD-10-CM

## 2016-04-25 MED ORDER — TOPIRAMATE 100 MG PO TABS: 100.0000 mg | ORAL_TABLET | Freq: Two times a day (BID) | ORAL | 4 refills | Status: DC

## 2016-04-25 MED ORDER — RIZATRIPTAN BENZOATE 10 MG PO TBDP: 10.0000 mg | ORAL_TABLET | ORAL | 11 refills | Status: DC | PRN

## 2016-04-25 NOTE — Progress Notes (Signed)
[facility]  PATIENT: Angela Farrell DOB: July 17, 1975  REFERRING CLINICIAN: Copeland HISTORY FROM: patient REASON FOR VISIT: follow up   HISTORICAL  CHIEF COMPLAINT:  Chief Complaint  Patient presents with  . Migraine    rm 6, "going pretty good, did have bad migraine following illness; coming less frequently though"  . Follow-up    6 month    HISTORY OF PRESENT ILLNESS:   UPDATE 04/25/16: Since last visit, doing well. Only 2-3 migraine in the last 6-7 months. Much improved.  Last migraine was a few weeks ago. Has been doing better with yoga and exercise.  UPDATE 09/21/15: Since last visit, overall stable. Avg 1-2 migraine per month. Avg 3-4 HA per week. Currently with right forehead headache.   UPDATE 06/21/15: Since last visit, was doing well with higher TPX, but now more work stress, and more HA since March 2017.  UPDATE 03/23/15: Since last visit, was doing better with TPX + rizatriptan; then had back surgery Feb 09, 2015; then Dec 2016 having more HA.  PRIOR HPI (12/25/14): 41 year old ambidextrous female here for evaluation of headaches. 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 Patient has history of headaches starting in childhood around 41 years old. She describes frontal, eye pressure, throbbing severe headaches with neck pain, photophobia, phonophobia and occasionally nausea. Headaches can last hours to days at a time. Triggers include stress. Over the years headaches varied in frequency and severity. More recently headaches have been occurring roughly 4-6 days per month. Patient has family history of cluster headache in her father. Patient herself has never been diagnosed with migraine headaches or tried migraine medications. Patient had been hospitalized several times for headache, light sensitivity, neck pain, fevers, with testing and lumbar puncture results demonstrating viral/aseptic meningitis. 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 On the basis of this patient was diagnosed with recurrent meningitis (Mollaret's) in 2013. Since that time she has been prescribed valacyclovir 500 mg twice a day to be taken during times of recurrence of symptoms. She uses this 6-8 months out of the year, 3-4 days at a time. This has helped keep some of her symptoms under control. Her last hospitalization was in 2013 for meningitis. More recently, last night patient had recurrence of headache. She had some nausea and vomiting last night. This morning and during office visit patient is suffering with severe headache, sensitivity to light, neck pain. Patient states that her current symptoms feel slightly similar to her prior meningitis attacks but not as severe. Patient has not started valacyclovir yet for this attack.   REVIEW OF SYSTEMS: Full 14 system review of systems performed and negative except for: runny nose.    ALLERGIES: No Known Allergies  HOME MEDICATIONS: Outpatient Medications Prior to Visit  Medication Sig Dispense Refill  . cyclobenzaprine (FLEXERIL) 10 MG tablet Take 0.5-1 tablets (5-10 mg total) by mouth 3 (three) times daily as needed for muscle spasms. 60 tablet 0  . etonogestrel (NEXPLANON) 68 MG IMPL implant Inject 1 each into the skin once.    . fluticasone (FLONASE) 50 MCG/ACT nasal spray Place 2 sprays into both nostrils daily. 16 g 6  . meloxicam (MOBIC) 15 MG tablet Take 1 tablet (15 mg total) by mouth daily. As needed for pain. Do not use with any other otc pain medication. 90 tablet 1  . omeprazole (PRILOSEC) 40 MG capsule TAKE ONE CAPSULE BY MOUTH EVERY DAY 30 capsule 3  .  ramelteon (ROZEREM) 8 MG tablet Take 8 mg by mouth at bedtime. occas as needed    . rizatriptan (MAXALT-MLT) 10 MG disintegrating tablet Take 1 tablet (10 mg total) by mouth as needed for migraine. May repeat in 2 hours if needed 9 tablet 11  . topiramate (TOPAMAX) 100 MG tablet Take 1 tablet (100 mg total) by mouth 2  (two) times daily. 180 tablet 4  . valACYclovir (VALTREX) 500 MG tablet Take 500 mg by mouth 2 (two) times daily. Reported on 05/02/2015     No facility-administered medications prior to visit.     PAST MEDICAL HISTORY: Past Medical History:  Diagnosis Date  . Allergy   . Anemia   . Anxiety   . Arthritis   . Chronic headache disorder    s/p Meningitis x 2, tension- type plus allergic/sinus problems  . History of abnormal Pap smear   . Meningitis, viral At age 29 and at age 53  . Nasal fracture 2011  . Other and unspecified ovarian cysts 2011- pelvic CT   GYN- Dr. Chevis Pretty  . Vision abnormalities     PAST SURGICAL HISTORY: Past Surgical History:  Procedure Laterality Date  . BACK SURGERY  02/09/15   L5 - S1 diskectomy  . FOOT SURGERY  s/p 2 procedures  . LAPAROSCOPY FOR ECTOPIC PREGNANCY  1998   Fallopian tube removed  . MOUTH SURGERY    . spinal injection  03/10/15  . SPINE SURGERY    . TUBAL LIGATION    . UTERINE ARTERY EMBOLIZATION      FAMILY HISTORY: Family History  Problem Relation Age of Onset  . Migraines Father     cluster headache  . Hyperlipidemia Father   . Hypertension Father   . Cancer Mother   . Stroke Maternal Grandmother   . Diabetes Paternal Grandmother   . Diabetes      paternal relaitve  . Seizures      maternal relative  . Rheum arthritis      maternal relatives    SOCIAL HISTORY:  Social History   Social History  . Marital status: Married    Spouse name: Jesusita Oka  . Number of children: N/A  . Years of education: N/A   Occupational History  . GIS [employer]   Social History Main Topics  . Smoking status: Former Games developer  . Smokeless tobacco: Never Used  . Alcohol use 0.0 oz/week  . Drug use: No  . Sexual activity: Not on file   Other Topics Concern  . Not on file   Social History Narrative   Drinks about 12-16oz of coffee a day, occasionally drinks a tea      PHYSICAL EXAM  GENERAL  EXAM/CONSTITUTIONAL: Vitals:  Vitals:   04/25/16 1323  BP: 124/84  Pulse: (!) 102  Weight: 212 lb (96.2 kg)   Wt Readings from Last 3 Encounters:  04/25/16 212 lb (96.2 kg)  02/29/16 211 lb (95.7 kg)  12/20/15 210 lb (95.3 kg)   Body mass index is 37.55 kg/m. No exam data present  Patient is well developed, nourished and groomed  CARDIOVASCULAR:  Examination of carotid arteries is normal; no carotid bruits  Regular rate and rhythm, no murmurs  Examination of peripheral vascular system by observation and palpation is normal  EYES:  Ophthalmoscopic exam of optic discs and posterior segments is normal; no papilledema or hemorrhages  MUSCULOSKELETAL:  Gait, strength, tone, movements noted in Neurologic exam below  NEUROLOGIC: MENTAL STATUS:  No  flowsheet data found.  awake, alert, oriented to person, place and time  recent and remote memory intact  normal attention and concentration  language fluent, comprehension intact, naming intact,   fund of knowledge appropriate  CRANIAL NERVE:   2nd - no papilledema on fundoscopic exam  2nd, 3rd, 4th, 6th - pupils equal and reactive to light, visual fields full to confrontation, extraocular muscles intact, no nystagmus  5th - facial sensation symmetric  7th - facial strength symmetric  8th - hearing intact  9th - palate elevates symmetrically, uvula midline  11th - shoulder shrug symmetric  12th - tongue protrusion midline  MOTOR:   normal bulk and tone, full strength in the BUE, BLE  SENSORY:   normal and symmetric to light touch, temperature, vibration  COORDINATION:   finger-nose-finger, fine finger movements normal  REFLEXES:   deep tendon reflexes present and symmetric  GAIT/STATION:   narrow based gait; romberg is negative    DIAGNOSTIC DATA (LABS, IMAGING, TESTING) - I reviewed patient records, labs, notes, testing and imaging myself where available.  Lab Results  Component Value  Date   WBC 4.8 12/20/2015   HGB 12.1 12/20/2015   HCT 36.8 12/20/2015   MCV 86.2 12/20/2015   PLT 325 12/20/2015      Component Value Date/Time   NA 137 12/20/2015 1417   K 4.3 12/20/2015 1417   CL 109 12/20/2015 1417   CO2 20 12/20/2015 1417   GLUCOSE 99 12/20/2015 1417   BUN 11 12/20/2015 1417   CREATININE 0.78 12/20/2015 1417   CALCIUM 9.2 12/20/2015 1417   PROT 7.2 12/20/2015 1417   ALBUMIN 4.4 12/20/2015 1417   AST 19 12/20/2015 1417   ALT 16 12/20/2015 1417   ALKPHOS 37 12/20/2015 1417   BILITOT 1.0 12/20/2015 1417   GFRNONAA >89 10/14/2014 0944   GFRAA >89 10/14/2014 0944   Lab Results  Component Value Date   CHOL 203 (H) 12/20/2015   HDL 41 (L) 12/20/2015   LDLCALC 126 12/20/2015   TRIG 178 (H) 12/20/2015   CHOLHDL 5.0 12/20/2015   Lab Results  Component Value Date   HGBA1C 5.3 12/16/2014   No results found for: VITAMINB12 Lab Results  Component Value Date   TSH 0.98 12/20/2015    10/22/14 MRI thoracic spine [I reviewed images myself and agree with interpretation. -VRP]  - Multilevel mild disc degeneration without definitive impingement. - Multilevel mild to moderate left foraminal stenoses are noted above.  11/27/14 MRI lumbar spine [I reviewed images myself and agree with interpretation. -VRP]  - Disc bulging and facet hypertrophy L4-5 with mild to moderate spinal stenosis. - Large central disc protrusion L5-S1 with possible extruded disc fragments. There is severe spinal stenosis with compression of the thecal sac and S1 nerve roots bilaterally.   09/26/14 CT head [I reviewed images myself and agree with interpretation. -VRP]  - Stable and normal noncontrast CT appearance of the brain.  01/20/15 MRI brain  - normal    ASSESSMENT AND PLAN  41 y.o. year old female here with history of recurrent aseptic/viral meningitis since 2008-2013, also with history of migraine type headaches since age 41 years old, never officially diagnosed. Also with  recurrent migraine attacks.   Dx:    Migraine without aura and without status migrainosus, not intractable  Mollaret's meningitis    PLAN: I spent 15 minutes of face to face time with patient. Greater than 50% of time was spent in counseling and coordination of care with patient.  In summary we discussed:  - continue topiramate 100mg  BID - continue rizatriptan prn - continue exercise / PT regimen  Meds ordered this encounter  Medications  . rizatriptan (MAXALT-MLT) 10 MG disintegrating tablet    Sig: Take 1 tablet (10 mg total) by mouth as needed for migraine. May repeat in 2 hours if needed    Dispense:  9 tablet    Refill:  11  . topiramate (TOPAMAX) 100 MG tablet    Sig: Take 1 tablet (100 mg total) by mouth 2 (two) times daily.    Dispense:  180 tablet    Refill:  4   Return in about 6 months (around 10/23/2016).    Suanne Marker, MD 04/25/2016, 2:11 PM Certified in Neurology, Neurophysiology and Neuroimaging  [facility] [address], [address] [place], [place] [address] [phone]

## 2016-07-06 ENCOUNTER — Other Ambulatory Visit: Payer: Self-pay | Admitting: Family Medicine

## 2016-07-06 NOTE — Telephone Encounter (Signed)
Per my last visit Meloxicam was not helping her symptoms. I will decline the medication advising that she come back to clinic. Angela Boston, MS, PA-C 7:36 PM, 07/06/2016

## 2016-07-06 NOTE — Telephone Encounter (Signed)
02/2016 last ov and refill 

## 2016-07-21 ENCOUNTER — Other Ambulatory Visit: Payer: Self-pay | Admitting: Family Medicine

## 2016-10-23 ENCOUNTER — Encounter: Payer: Self-pay | Admitting: Diagnostic Neuroimaging

## 2016-10-23 ENCOUNTER — Ambulatory Visit (INDEPENDENT_AMBULATORY_CARE_PROVIDER_SITE_OTHER): Payer: 59 | Admitting: Diagnostic Neuroimaging

## 2016-10-23 VITALS — BP 136/91 | HR 86 | Ht 63.0 in | Wt 209.4 lb

## 2016-10-23 DIAGNOSIS — G032 Benign recurrent meningitis [Mollaret]: Secondary | ICD-10-CM

## 2016-10-23 DIAGNOSIS — G43009 Migraine without aura, not intractable, without status migrainosus: Secondary | ICD-10-CM

## 2016-10-23 MED ORDER — PREDNISONE 10 MG PO TABS: ORAL_TABLET | ORAL | 0 refills | Status: DC

## 2016-10-23 MED ORDER — TOPIRAMATE 100 MG PO TABS: 100.0000 mg | ORAL_TABLET | Freq: Two times a day (BID) | ORAL | 4 refills | Status: DC

## 2016-10-23 MED ORDER — PROPRANOLOL HCL 20 MG PO TABS: 20.0000 mg | ORAL_TABLET | Freq: Two times a day (BID) | ORAL | 6 refills | Status: DC

## 2016-10-23 MED ORDER — RIZATRIPTAN BENZOATE 10 MG PO TBDP
10.0000 mg | ORAL_TABLET | ORAL | 11 refills | Status: DC | PRN
Start: 2016-10-23 — End: 2017-12-10

## 2016-10-23 NOTE — Patient Instructions (Signed)
MIGRAINE WITHOUT AURA - start prednisone dose pack - start propranolol 20mg  twice a day - continue topiramate 100mg  twice a day - continue rizatriptan as needed - consider botox for migraine or CGRP antagonist (aimovig) - consider second opinion with headache specialist

## 2016-10-23 NOTE — Progress Notes (Signed)
[facility]  PATIENT: Angela EconomySheila C Sanon DOB: 12-Mar-1976  REFERRING CLINICIAN: [place] HISTORY FROM: patient REASON FOR VISIT: follow up   HISTORICAL  CHIEF COMPLAINT:  Chief Complaint  Patient presents with  . Follow-up  . Migraine    has had increase (1  bad migraine in last 2 wks. ) maxalt not working as well.  No sleeping well.      HISTORY OF PRESENT ILLNESS:   UPDATE 10/23/16: Since last visit, was doing well until last few months. Now daily headaches, and severe migraine 1 per week. [place] 4th week, and having more migraines.   UPDATE 04/25/16: Since last visit, doing well. Only 2-3 migraine in the last 6-7 months. Much improved.  Last migraine was a few weeks ago. Has been doing better with yoga and exercise.  UPDATE 09/21/15: Since last visit, overall stable. Avg 1-2 migraine per month. Avg 3-4 HA per week. Currently with right forehead headache.   UPDATE 06/21/15: Since last visit, was doing well with higher TPX, but now more work stress, and more HA since March 2017.  UPDATE 03/23/15: Since last visit, was doing better with TPX + rizatriptan; then had back surgery Feb 09, 2015; then Dec 2016 having more HA.  PRIOR HPI (12/25/14): 41 year old ambidextrous female here for evaluation of headaches. 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 Patient has history of headaches starting in childhood around 41 years old. She describes frontal, eye pressure, throbbing severe headaches with neck pain, photophobia, phonophobia and occasionally nausea. Headaches can last hours to days at a time. Triggers include stress. Over the years headaches varied in frequency and severity. More recently headaches have been occurring roughly 4-6 days per month. Patient has family history of cluster headache in her father. Patient herself has never been diagnosed with migraine headaches or tried migraine  medications. Patient had been hospitalized several times for headache, light sensitivity, neck pain, fevers, with testing and lumbar puncture results demonstrating viral/aseptic meningitis. 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 On the basis of this patient was diagnosed with recurrent meningitis (Mollaret's) in 2013. Since that time she has been prescribed valacyclovir 500 mg twice a day to be taken during times of recurrence of symptoms. She uses this 6-8 months out of the year, 3-4 days at a time. This has helped keep some of her symptoms under control. Her last hospitalization was in 2013 for meningitis. More recently, last night patient had recurrence of headache. She had some nausea and vomiting last night. This morning and during office visit patient is suffering with severe headache, sensitivity to light, neck pain. Patient states that her current symptoms feel slightly similar to her prior meningitis attacks but not as severe. Patient has not started valacyclovir yet for this attack.   REVIEW OF SYSTEMS: Full 14 system review of systems performed and negative except for: decr concentration runny nose nausea blurred vision eye discharge.    ALLERGIES: No Known Allergies  HOME MEDICATIONS: Outpatient Medications Prior to Visit  Medication Sig Dispense Refill  . etonogestrel (NEXPLANON) 68 MG IMPL implant Inject 1 each into the skin once.    . fluticasone (FLONASE) 50 MCG/ACT nasal spray Place 2 sprays into both nostrils daily. 16 g 6  . meloxicam (MOBIC) 15 MG tablet Take 1 tablet (15 mg total) by mouth daily. As needed for pain. Do not use with any other otc pain medication.  90 tablet 1  . omeprazole (PRILOSEC) 40 MG capsule TAKE ONE CAPSULE BY MOUTH EVERY DAY 30 capsule 3  . ramelteon (ROZEREM) 8 MG tablet Take 8 mg by mouth at bedtime. occas as needed    . rizatriptan (MAXALT-MLT) 10 MG disintegrating tablet Take 1 tablet (10 mg total) by mouth as needed for migraine. May repeat in 2  hours if needed 9 tablet 11  . topiramate (TOPAMAX) 100 MG tablet Take 1 tablet (100 mg total) by mouth 2 (two) times daily. 180 tablet 4  . valACYclovir (VALTREX) 500 MG tablet Take 500 mg by mouth 2 (two) times daily. Reported on 05/02/2015    . cyclobenzaprine (FLEXERIL) 10 MG tablet Take 0.5-1 tablets (5-10 mg total) by mouth 3 (three) times daily as needed for muscle spasms. (Patient not taking: Reported on 10/23/2016) 60 tablet 0   No facility-administered medications prior to visit.     PAST MEDICAL HISTORY: Past Medical History:  Diagnosis Date  . Allergy   . Anemia   . Anxiety   . Arthritis   . Chronic headache disorder    s/p Meningitis x 2, tension- type plus allergic/sinus problems  . History of abnormal Pap smear   . Meningitis, viral At age 41 and at age 41  . Nasal fracture 2011  . Other and unspecified ovarian cysts 2011- pelvic CT   GYN- Dr. Chevis Pretty  . Vision abnormalities     PAST SURGICAL HISTORY: Past Surgical History:  Procedure Laterality Date  . BACK SURGERY  02/09/15   L5 - S1 diskectomy  . FOOT SURGERY  s/p 2 procedures  . LAPAROSCOPY FOR ECTOPIC PREGNANCY  1998   Fallopian tube removed  . MOUTH SURGERY    . spinal injection  03/10/15  . SPINE SURGERY    . TUBAL LIGATION    . UTERINE ARTERY EMBOLIZATION      FAMILY HISTORY: Family History  Problem Relation Age of Onset  . Migraines Father        cluster headache  . Hyperlipidemia Father   . Hypertension Father   . Cancer Mother   . Stroke Maternal Grandmother   . Diabetes Paternal Grandmother   . Diabetes Unknown        paternal relaitve  . Seizures Unknown        maternal relative  . Rheum arthritis Unknown        maternal relatives    SOCIAL HISTORY:  Social History   Social History  . Marital status: Married    Spouse name: Jesusita Oka  . Number of children: N/A  . Years of education: N/A   Occupational History  . [employer]   Social History Main Topics  .  Smoking status: Former Games developer  . Smokeless tobacco: Never Used  . Alcohol use 0.0 oz/week  . Drug use: No  . Sexual activity: Not on file   Other Topics Concern  . Not on file   Social History Narrative   Drinks about 12-16oz of coffee a day, occasionally drinks a tea.   [employer] on computer as Systems developer.      PHYSICAL EXAM  GENERAL EXAM/CONSTITUTIONAL: Vitals:  Vitals:   10/23/16 1548  BP: (!) 136/91  Pulse: 86  Weight: 209 lb 6.4 oz (95 kg)  Height: 5\' 3"  (1.6 m)   Wt Readings from Last 3 Encounters:  10/23/16 209 lb 6.4 oz (95 kg)  04/25/16 212 lb (96.2 kg)  02/29/16 211 lb (95.7 kg)  Body mass index is 37.09 kg/m. No exam data present  Patient is well developed, nourished and groomed  CARDIOVASCULAR:  Examination of carotid arteries is normal; no carotid bruits  Regular rate and rhythm, no murmurs  Examination of peripheral vascular system by observation and palpation is normal  EYES:  Ophthalmoscopic exam of optic discs and posterior segments is normal; no papilledema or hemorrhages  MUSCULOSKELETAL:  Gait, strength, tone, movements noted in Neurologic exam below  NEUROLOGIC: MENTAL STATUS:  No flowsheet data found.  awake, alert, oriented to person, place and time  recent and remote memory intact  normal attention and concentration  language fluent, comprehension intact, naming intact,   fund of knowledge appropriate  CRANIAL NERVE:   2nd - no papilledema on fundoscopic exam  2nd, 3rd, 4th, 6th - pupils equal and reactive to light, visual fields full to confrontation, extraocular muscles intact, no nystagmus  5th - facial sensation symmetric  7th - facial strength symmetric  8th - hearing intact  9th - palate elevates symmetrically, uvula midline  11th - shoulder shrug symmetric  12th - tongue protrusion midline  MOTOR:   normal bulk and tone, full strength in the BUE, BLE  SENSORY:   normal and symmetric to light touch,  temperature, vibration  COORDINATION:   finger-nose-finger, fine finger movements normal  REFLEXES:   deep tendon reflexes present and symmetric  GAIT/STATION:   narrow based gait; romberg is negative    DIAGNOSTIC DATA (LABS, IMAGING, TESTING) - I reviewed patient records, labs, notes, testing and imaging myself where available.  Lab Results  Component Value Date   WBC 4.8 12/20/2015   HGB 12.1 12/20/2015   HCT 36.8 12/20/2015   MCV 86.2 12/20/2015   PLT 325 12/20/2015      Component Value Date/Time   NA 137 12/20/2015 1417   K 4.3 12/20/2015 1417   CL 109 12/20/2015 1417   CO2 20 12/20/2015 1417   GLUCOSE 99 12/20/2015 1417   BUN 11 12/20/2015 1417   CREATININE 0.78 12/20/2015 1417   CALCIUM 9.2 12/20/2015 1417   PROT 7.2 12/20/2015 1417   ALBUMIN 4.4 12/20/2015 1417   AST 19 12/20/2015 1417   ALT 16 12/20/2015 1417   ALKPHOS 37 12/20/2015 1417   BILITOT 1.0 12/20/2015 1417   GFRNONAA >89 10/14/2014 0944   GFRAA >89 10/14/2014 0944   Lab Results  Component Value Date   CHOL 203 (H) 12/20/2015   HDL 41 (L) 12/20/2015   LDLCALC 126 12/20/2015   TRIG 178 (H) 12/20/2015   CHOLHDL 5.0 12/20/2015   Lab Results  Component Value Date   HGBA1C 5.3 12/16/2014   No results found for: VITAMINB12 Lab Results  Component Value Date   TSH 0.98 12/20/2015    10/22/14 MRI thoracic spine [I reviewed images myself and agree with interpretation. -VRP]  - Multilevel mild disc degeneration without definitive impingement. - Multilevel mild to moderate left foraminal stenoses are noted above.  11/27/14 MRI lumbar spine [I reviewed images myself and agree with interpretation. -VRP]  - Disc bulging and facet hypertrophy L4-5 with mild to moderate spinal stenosis. - Large central disc protrusion L5-S1 with possible extruded disc fragments. There is severe spinal stenosis with compression of the thecal sac and S1 nerve roots bilaterally.   09/26/14 CT head [I reviewed  images myself and agree with interpretation. -VRP]  - Stable and normal noncontrast CT appearance of the brain.  01/20/15 MRI brain  - normal  03/03/15 PSG / split -  normal AHI - sleep fragmentation    ASSESSMENT AND PLAN  41 y.o. year old female here with history of recurrent aseptic/viral meningitis since 2008-2013, also with history of migraine type headaches since age 75 years old, never officially diagnosed. Also with recurrent migraine attacks.   Dx:    Migraine without aura and without status migrainosus, not intractable  Mollaret's meningitis    PLAN:  MIGRAINE WITHOUT AURA - start prednisone dose pack - start propranolol 20mg  twice a day - continue topiramate 100mg  twice a day - continue rizatriptan as needed - continue exercise / PT regimen - consider botox for migraine or CGRP antagonist (aimovig) - consider second opinion with Dr. Lucia Gaskins (headache specialist) - sleep hygiene reviewed  Meds ordered this encounter  Medications  . predniSONE (DELTASONE) 10 MG tablet    Sig: Take 60mg  on day 1. Reduce by 10mg  each subsequent day. (60, 50, 40, 30, 20, 10, stop)    Dispense:  21 tablet    Refill:  0  . propranolol (INDERAL) 20 MG tablet    Sig: Take 1 tablet (20 mg total) by mouth 2 (two) times daily.    Dispense:  60 tablet    Refill:  6  . topiramate (TOPAMAX) 100 MG tablet    Sig: Take 1 tablet (100 mg total) by mouth 2 (two) times daily.    Dispense:  180 tablet    Refill:  4  . rizatriptan (MAXALT-MLT) 10 MG disintegrating tablet    Sig: Take 1 tablet (10 mg total) by mouth as needed for migraine. May repeat in 2 hours if needed    Dispense:  9 tablet    Refill:  11   No Follow-up on file.    Suanne Marker, MD 10/23/2016, 4:18 PM Certified in Neurology, Neurophysiology and Neuroimaging  [facility] [address] [place], [place] [address] [phone]

## 2016-12-21 ENCOUNTER — Encounter: Payer: Self-pay | Admitting: Family Medicine

## 2016-12-21 ENCOUNTER — Ambulatory Visit (INDEPENDENT_AMBULATORY_CARE_PROVIDER_SITE_OTHER): Payer: 59 | Admitting: Family Medicine

## 2016-12-21 VITALS — BP 123/84 | HR 75 | Temp 97.7°F | Resp 18 | Ht 63.0 in | Wt 204.6 lb

## 2016-12-21 DIAGNOSIS — R51 Headache: Secondary | ICD-10-CM | POA: Diagnosis not present

## 2016-12-21 DIAGNOSIS — A879 Viral meningitis, unspecified: Secondary | ICD-10-CM

## 2016-12-21 DIAGNOSIS — Z1329 Encounter for screening for other suspected endocrine disorder: Secondary | ICD-10-CM | POA: Diagnosis not present

## 2016-12-21 DIAGNOSIS — Z0000 Encounter for general adult medical examination without abnormal findings: Secondary | ICD-10-CM | POA: Diagnosis not present

## 2016-12-21 DIAGNOSIS — Z130 Encounter for screening for diseases of the blood and blood-forming organs and certain disorders involving the immune mechanism: Secondary | ICD-10-CM

## 2016-12-21 DIAGNOSIS — N949 Unspecified condition associated with female genital organs and menstrual cycle: Secondary | ICD-10-CM | POA: Diagnosis not present

## 2016-12-21 DIAGNOSIS — G8929 Other chronic pain: Secondary | ICD-10-CM

## 2016-12-21 DIAGNOSIS — E559 Vitamin D deficiency, unspecified: Secondary | ICD-10-CM

## 2016-12-21 DIAGNOSIS — R1030 Lower abdominal pain, unspecified: Secondary | ICD-10-CM | POA: Diagnosis not present

## 2016-12-21 DIAGNOSIS — Z1389 Encounter for screening for other disorder: Secondary | ICD-10-CM | POA: Diagnosis not present

## 2016-12-21 DIAGNOSIS — J3089 Other allergic rhinitis: Secondary | ICD-10-CM | POA: Diagnosis not present

## 2016-12-21 DIAGNOSIS — Z136 Encounter for screening for cardiovascular disorders: Secondary | ICD-10-CM

## 2016-12-21 DIAGNOSIS — Z6837 Body mass index (BMI) 37.0-37.9, adult: Secondary | ICD-10-CM

## 2016-12-21 DIAGNOSIS — E6609 Other obesity due to excess calories: Secondary | ICD-10-CM

## 2016-12-21 DIAGNOSIS — R519 Headache, unspecified: Secondary | ICD-10-CM

## 2016-12-21 DIAGNOSIS — Z1383 Encounter for screening for respiratory disorder NEC: Secondary | ICD-10-CM

## 2016-12-21 LAB — POCT URINALYSIS DIP (MANUAL ENTRY)
BILIRUBIN UA: NEGATIVE mg/dL
Bilirubin, UA: NEGATIVE
Blood, UA: NEGATIVE
Glucose, UA: NEGATIVE mg/dL
LEUKOCYTES UA: NEGATIVE
Nitrite, UA: NEGATIVE
PH UA: 7 (ref 5.0–8.0)
SPEC GRAV UA: 1.015 (ref 1.010–1.025)
UROBILINOGEN UA: 1 U/dL

## 2016-12-21 LAB — POCT WET + KOH PREP
Trich by wet prep: ABSENT
YEAST BY WET PREP: ABSENT
Yeast by KOH: ABSENT

## 2016-12-21 LAB — POC MICROSCOPIC URINALYSIS (UMFC): MUCUS RE: ABSENT

## 2016-12-21 LAB — POC HEMOCCULT BLD/STL (OFFICE/1-CARD/DIAGNOSTIC): FECAL OCCULT BLD: NEGATIVE

## 2016-12-21 MED ORDER — VALACYCLOVIR HCL 500 MG PO TABS: 500.0000 mg | ORAL_TABLET | Freq: Two times a day (BID) | ORAL | 2 refills | Status: DC

## 2016-12-21 MED ORDER — AZELASTINE HCL 0.1 % NA SOLN
2.0000 | Freq: Two times a day (BID) | NASAL | 12 refills | Status: DC
Start: 2016-12-21 — End: 2017-03-05

## 2016-12-21 MED ORDER — BUDESONIDE 32 MCG/ACT NA SUSP: 2.0000 | Freq: Every day | NASAL | 12 refills | Status: DC

## 2016-12-21 MED ORDER — MELOXICAM 15 MG PO TABS: 15.0000 mg | ORAL_TABLET | Freq: Every day | ORAL | 1 refills | Status: DC

## 2016-12-21 NOTE — Progress Notes (Signed)
Subjective:    [name], female    DOB: March 25, 1976, 41 y.o.   MRN: [id number] Chief Complaint  Patient presents with  . Annual Exam    No PAP needed. Pt gets Flu vaccine at work.  . Medication Refill    Valtrex 500 MG, Meloxicam 15 MG    HPI  Angela Farrell is a delightful 41 yo female here today for her CPE. I have only met her once prior which was at her visit last year for the same.  Primary Preventative Screenings: Cervical Cancer: see ob-gyn [facility] - her gyn has changed sev times due to retirements, distant h/o abnml pap Family Planning: Nexplanon in place - done 2017 so good till 2020. STI screening: done by gyn Breast Cancer: normal mammograms though gyn - done at the office. Last done at the end of last yr or beginning of this year Colorectal Cancer: no FHx.  Tobacco use/EtOH/substances:  H/o tob use but occ slips up - smoked regularly until 2010 - no more than 1/4 ppd, occ social EtOH use. Bone Density:  Cardiac: EKG 02/29/2016 nml Weight/Blood sugar/Diet/Exercise:  Walks a lot but not as much exercise regularly. OTC/Vit/Supp/Herbal: Angela Farrell takes Vitamin A, C, D3 2000u/d, and Super B-complex with folic acid. Lutein, K gluconate, CoQ10. Not taking any calcium supp. Occ aleve/tylenol. Dentist/Optho: Every 6 months with Dentist. Sees Dr. Lynnae Prude at [facility].  Immunizations:  Immunization History  Administered Date(s) Administered  . Influenza Split 02/01/2011, 01/02/2012, 01/01/2014  . Td 12/16/2014  . Tdap 01/30/2006     Chronic Medical Conditions: Migraine: h/o meningitis x2: [facility] top[facility]ax w/ Prn maxalt and propranolol.  Has had HAs all of her life but in her 20-30s began to get debilitating HAs. Had several episodes of viral meningitis requiring 5d hosp where Angela Farrell would just be given pain medicines and a big bill. Now Angela Farrell knows to avoid stress triggers and so whenever Angela Farrell starts to feel a flair  of the mengitis type sxs Angela Farrell starts the valtrex and sxs resolve after 2-3d. Now has episodes < 1/yr but did have last week - tends to happen more when Angela Farrell travels - not sure why.  Has a chronic daily HA which occ flairs more.  Insomnia: rozerem GERD: prn prilosec Chronic allergic rhinitis/sinusitis: flonase, allegy eye drops not helpful and neurologist told her to stay away from antihistamines as could make migraines worse H/o vit D def: Bilateral abd pain started yesterday while urinating. Bowels normal. No other sxs. Lab Results  Component Value Date   VD25OH 31 12/20/2015   VD25OH 25 (L) 11/28/2013   VD25OH 24 (L) 11/26/2012   Past Medical History:  Diagnosis Date  . Allergy   . Anemia   . Anxiety   . Arthritis   . Chronic headache disorder    s/p Meningitis x 2, tension- type plus allergic/sinus problems  . History of abnormal Pap smear   . Meningitis, viral At age [age]and at age 41 . Nasal fracture 2011  . Other and unspecified ovarian cysts 2011- pelvic CT   GYN- Dr. MCarren Rang . Vision abnormalities    Past Surgical History:  Procedure Laterality Date  . BACK SURGERY  02/09/15   L5 - S1 diskectomy  . FOOT SURGERY  s/p 2 procedures  . LAPAROSCOPY FOR ECTOPIC PREGNANCY  1998   Fallopian tube removed  . MOUTH SURGERY    . spinal injection  03/10/15  . SPINE SURGERY    . TUBAL LIGATION    . UTERINE ARTERY EMBOLIZATION     Current Outpatient Prescriptions on File Prior to Visit  Medication Sig Dispense Refill  . etonogestrel (NEXPLANON) 68 MG IMPL implant Inject 1 each into the skin once.    . fluticasone (FLONASE) 50 MCG/ACT nasal spray Place 2 sprays into both nostrils daily. 16 g 6  . meloxicam (MOBIC) 15 MG tablet Take 1 tablet (15 mg total) by mouth daily. As needed for pain. Do not use with any other otc pain medication. 90 tablet 1  . omeprazole (PRILOSEC) 40 MG capsule TAKE ONE CAPSULE BY MOUTH EVERY DAY 30 capsule 3  . propranolol (INDERAL) 20 MG tablet Take 1  tablet (20 mg total) by mouth 2 (two) times daily. 60 tablet 6  . ramelteon (ROZEREM) 8 MG tablet Take 8 mg by mouth at bedtime. occas as needed    . rizatriptan (MAXALT-MLT) 10 MG disintegrating tablet Take 1 tablet (10 mg total) by mouth as needed for migraine. May repeat in 2 hours if needed 9 tablet 11  . topiramate (TOPAMAX) 100 MG tablet Take 1 tablet (100 mg total) by mouth 2 (two) times daily. 180 tablet 4  . valACYclovir (VALTREX) 500 MG tablet Take 500 mg by mouth 2 (two) times daily. Reported on 05/02/2015    . predniSONE (DELTASONE) 10 MG tablet Take '60mg'$  on day 1. Reduce by '10mg'$  each subsequent day. (60, 50, 40, 30, 20, 10, stop) (Patient not taking: Reported on 12/21/2016) 21 tablet 0   No current facility-administered medications on file prior to visit.    No Known Allergies Family History  Problem Relation Age of Onset  . Migraines Father        cluster headache  . Hyperlipidemia Father   . Hypertension Father   . Cancer Mother   . Stroke Maternal Grandmother   . Diabetes Paternal Grandmother   . Diabetes Unknown        paternal relaitve  . Seizures Unknown        maternal relative  . Rheum arthritis Unknown        maternal relatives   Social History   Social History  . Marital status: Married    Spouse name: Linna Hoff  . Number of children: N/A  . Years of education: N/A   Occupational History  . [employer] History Main Topics  . Smoking status: Former Research scientist (life sciences)  . Smokeless tobacco: Never Used  . Alcohol use 0.0 oz/week  . Drug use: No  . Sexual activity: Not Asked   Other Topics Concern  . None   Social History Narrative   Drinks about 12-16oz of coffee a day, occasionally drinks a tea.   [employer]oks on computer as Administrator.    Depression screen [facility] 2/9 12/21/2016 02/29/2016 12/20/2015 12/16/2014 11/02/2014  Decreased Interest 0 0 0 0 0  Down, Depressed, Hopeless 0 0 0 0 0  PHQ - 2 Score 0 0 0 0 0  Altered sleeping - - - - -  Tired,  decreased energy - - - - -  Change in appetite - - - - -  Feeling bad or failure about yourself  - - - - -  Trouble concentrating - - - - -  Moving slowly or fidgety/restless - - - - -  Suicidal thoughts - - - - -  PHQ-9 Score - - - - -  Review of Systems  All other systems reviewed and are negative.  See hpi    Objective:   Physical Exam  Constitutional: Angela Farrell is oriented to person, place, and time. Angela Farrell appears well-developed and well-nourished. No distress.  HENT:  Head: Normocephalic and atraumatic.  Right Ear: Tympanic membrane, external ear and ear canal normal.  Left Ear: Tympanic membrane, external ear and ear canal normal.  Nose: Nose normal. No mucosal edema or rhinorrhea.  Mouth/Throat: Uvula is midline, oropharynx is clear and moist and mucous membranes are normal. No posterior oropharyngeal erythema.  Eyes: Pupils are equal, round, and reactive to light. Conjunctivae and EOM are normal. Right eye exhibits no discharge. Left eye exhibits no discharge. No scleral icterus.  Neck: Normal range of motion. Neck supple. No thyromegaly present.  Cardiovascular: Normal rate, regular rhythm, normal heart sounds and intact distal pulses.   Pulmonary/Chest: Effort normal and breath sounds normal. No respiratory distress.  Abdominal: Soft. Bowel sounds are normal. There is no tenderness.  Musculoskeletal: Angela Farrell exhibits no edema.  Lymphadenopathy:    Angela Farrell has no cervical adenopathy.  Neurological: Angela Farrell is alert and oriented to person, place, and time. Angela Farrell has normal reflexes.  Skin: Skin is warm and dry. Angela Farrell is not diaphoretic. No erythema.  Psychiatric: Angela Farrell has a normal mood and affect. Her behavior is normal.      BP 123/84 (BP Location: Right Arm, Patient Position: Sitting, Cuff Size: Large)   Pulse 75   Temp 97.7 F (36.5 C) (Oral)   Resp 18   Ht '5\' 3"'$  (1.6 m)   Wt 204 lb 9.6 oz (92.8 kg)   SpO2 98%   BMI 36.24 kg/m      Assessment & Plan:  Flu shot? Gets at  work Ua, cmp, lipids, vit D, cbc, tsh  1. Annual physical exam   2. Screening for cardiovascular, respiratory, and genitourinary diseases   3. Screening for deficiency anemia   4. Screening for thyroid disorder   5. Vitamin D deficiency   6. Class 2 obesity due to excess calories without serious comorbidity with body mass index (BMI) of 37.0 to 37.9 in adult   7. Lower abdominal pain   8. Viral meningitis   9. Chronic nonintractable headache, unspecified headache type   10. Non-seasonal allergic rhinitis, unspecified trigger   11. Uterine tenderness     Orders Placed This Encounter  Procedures  . CBC with Differential/Platelet  . Comprehensive metabolic panel  . Lipid panel  . TSH  . VITAMIN D 25 Hydroxy (Vit-D Deficiency, Fractures)  . Ambulatory referral to Allergy    Referral Priority:   Routine    Referral Type:   Allergy Testing    Referral Reason:   Specialty Services Required    Requested Specialty:   Allergy    Number of Visits Requested:   1  . POCT urinalysis dipstick  . POCT Microscopic Urinalysis ([facility])  . POCT Wet + KOH Prep  . POC Hemoccult Bld/Stl (1-Cd Office Dx)    Meds ordered this encounter  Medications  . valACYclovir (VALTREX) 500 MG tablet    Sig: Take 1 tablet (500 mg total) by mouth 2 (two) times daily. To prevent recurrence of viral meningitis    Dispense:  60 tablet    Refill:  2  . meloxicam (MOBIC) 15 MG tablet    Sig: Take 1 tablet (15 mg total) by mouth daily. As needed for pain. Do not use with any other otc pain medication.  Dispense:  90 tablet    Refill:  1  . azelastine (ASTELIN) 0.1 % nasal spray    Sig: Place 2 sprays into both nostrils 2 (two) times daily. In addition to the rhinocort    Dispense:  30 mL    Refill:  12  . budesonide (RHINOCORT AQUA) 32 MCG/ACT nasal spray    Sig: Place 2 sprays into both nostrils daily.    Dispense:  1 Bottle    Refill:  12    Delman Cheadle, M.D.  Primary Care at [facility] [address] [place], [place] [address] [phone] phone [phone] fax  12/24/16 12:58 AM

## 2016-12-21 NOTE — Patient Instructions (Addendum)
Since your neurologist told you to avoid antihistamines - start a nasal antihistamine azelastine and try changing the type of nasal steroid you are using - like rhinocort rather than flonase. I am happy to try you on different eye drops if I know what you are using.   Chronic sinus congestion and allergies might make her headache syndrome worse and since it is daily and unresponsive to initial treatment, it is likely with seen an allergist to get testing to see what exactly you are allergic to in which case you could have a chance of avoiding it or even getting allergy shots to prevent your symptoms entirely.  It is possible that you may be starting your menses as you seem to have some uterine tenderness on exam today. Fortunately your exam was otherwise normal so watchful waiting on this is fine for now. If this continues to get worse, please come back immediately for further evaluation.   We recommend that you schedule a mammogram for breast cancer screening. Typically, you do not need a referral to do this. Please contact a local imaging center to schedule your mammogram.  [facility] - [phone]  *ask for the Radiology Department [facility]) - [phone] or [phone]  [facility] - [phone] [place] [phone] [facility] - [phone]  *ask for the [facility] - [phone]  *ask for the Radiology Department [facility] - [phone]  *ask for the [facility] - [phone]   IF you received an x-ray today, you will receive an invoice from [facility]. Please contact [facility] at [phone] with questions or concerns regarding your invoice.   IF you received labwork today, you will receive an invoice from [facility]. Please contact [facility] at [phone] with questions or concerns regarding  your invoice.   Our billing staff will not be able to assist you with questions regarding bills from these companies.  You will be contacted with the lab results as soon as they are available. The fastest way to get your results is to activate your My Chart account. Instructions are located on the last page of this paperwork. If you have not heard from [place] regarding the results in 2 weeks, please contact this office.     Calcium Intake Recommendations Calcium is a mineral that affects many functions in the body, including:  Blood clotting.  Blood vessel function.  Nerve impulse conduction.  Hormone secretion.  Muscle contraction.  Bone and teeth functions.  Most of your body's calcium supply is stored in your bones and teeth. When your calcium stores are low, you may be at risk for low bone mass, bone loss, and bone fractures. Consuming enough calcium helps to grow healthy bones and teeth and to prevent breakdown over time. It is very important that you get enough calcium if you are:  A child undergoing rapid growth.  An adolescent girl.  A pre- or post-menopausal woman.  A woman whose menstrual cycle has stopped due to anorexia nervosa or regular intense exercise.  An individual with lactose intolerance or a milk allergy.  A vegetarian.  What is my plan? Try to consume the recommended amount of calcium daily based on your age. Depending on your overall health, your health care provider may recommend increased calcium intake.General daily calcium intake recommendations by age are:  Birth to 6 months: 200 mg.  Infants 7 to 12  months: 260 mg.  Children 1 to 3 years: 700 mg.  Children 4 to 8 years: 1,000 mg.  Children 9 to 13 years: 1,300 mg.  Teens 14 to 18 years: 1,300 mg.  Adults 19 to 50 years: 1,000 mg.  Adult women 51 to 70 years: 1,200 mg.  Adult men 51 to 70 years: 1,000 mg.  Adults 71 years and older: 1,200 mg.  Pregnant and breastfeeding teens:  1,300 mg.  Pregnant and breastfeeding adults: 1,000 mg.  What do I need to know about calcium intake?  In order for the body to absorb calcium, it needs vitamin D. You can get vitamin D through: ? Direct exposure of the skin to sunlight. ? Foods, such as egg yolks, liver, saltwater fish, and fortified milk. ? Supplements.  Consuming too much calcium may cause: ? Constipation. ? Decreased absorption of iron and zinc. ? Kidney stones.  Calcium supplements may interact with certain medicines. Check with your health care provider before starting any calcium supplements.  Try to get most of your calcium from food. What foods can I eat? Grains  Fortified oatmeal. Fortified ready-to-eat cereals. Fortified frozen waffles. Vegetables Turnip greens. Broccoli. Fruits Fortified orange juice. Meats and Other Protein Sources Canned sardines with bones. Canned salmon with bones. Soy beans. Tofu. Baked beans. Almonds. [place] nuts. Sunflower seeds. Dairy Milk. Yogurt. Cheese. Cottage cheese. Beverages Fortified soy milk. Fortified rice milk. Sweets/Desserts Pudding. Ice Cream. Milkshakes. Blackstrap molasses. The items listed above may not be a complete list of recommended foods or beverages. Contact your dietitian for more options. What foods can affect my calcium intake? It may be more difficult for your body to use calcium or calcium may leave your body more quickly if you consume large amounts of:  Sodium.  Protein.  Caffeine.  Alcohol.  This information is not intended to replace advice given to you by your health care provider. Make sure you discuss any questions you have with your health care provider. Document Released: 11/02/2003 Document Revised: 10/08/2015 Document Reviewed: 08/26/2013 Elsevier Interactive Patient Education  2018 [place] Maintenance, Female Adopting a healthy lifestyle and getting preventive care can go a long way to promote health and  wellness. Talk with your health care provider about what schedule of regular examinations is right for you. This is a good chance for you to check in with your provider about disease prevention and staying healthy. In between checkups, there are plenty of things you can do on your own. Experts have done a lot of research about which lifestyle changes and preventive measures are most likely to keep you healthy. Ask your health care provider for more information. Weight and diet Eat a healthy diet  Be sure to include plenty of vegetables, fruits, low-fat dairy products, and lean protein.  Do not eat a lot of foods high in solid fats, added sugars, or salt.  Get regular exercise. This is one of the most important things you can do for your health. ? Most adults should exercise for at least 150 minutes each week. The exercise should increase your heart rate and make you sweat (moderate-intensity exercise). ? Most adults should also do strengthening exercises at least twice a week. This is in addition to the moderate-intensity exercise.  Maintain a healthy weight  Body mass index (BMI) is a measurement that can be used to identify possible weight problems. It estimates body fat based on height and weight. Your health care provider can help determine your BMI and  help you achieve or maintain a healthy weight.  For females 28 years of age and older: ? A BMI below 18.5 is considered underweight. ? A BMI of 18.5 to 24.9 is normal. ? A BMI of 25 to 29.9 is considered overweight. ? A BMI of 30 and above is considered obese.  Watch levels of cholesterol and blood lipids  You should start having your blood tested for lipids and cholesterol at 43 years of age, then have this test every 5 years.  You may need to have your cholesterol levels checked more often if: ? Your lipid or cholesterol levels are high. ? You are older than 43 years of age. ? You are at high risk for heart disease.  Cancer  screening Lung Cancer  Lung cancer screening is recommended for adults 71-3 years old who are at high risk for lung cancer because of a history of smoking.  A yearly low-dose CT scan of the lungs is recommended for people who: ? Currently smoke. ? Have quit within the past 15 years. ? Have at least a 30-pack-year history of smoking. A pack year is smoking an average of one pack of cigarettes a day for 1 year.  Yearly screening should continue until it has been 15 years since you quit.  Yearly screening should stop if you develop a health problem that would prevent you from having lung cancer treatment.  Breast Cancer  Practice breast self-awareness. This means understanding how your breasts normally appear and feel.  It also means doing regular breast self-exams. Let your health care provider know about any changes, no matter how small.  If you are in your 20s or 30s, you should have a clinical breast exam (CBE) by a health care provider every 1-3 years as part of a regular health exam.  If you are 40 or older, have a CBE every year. Also consider having a breast X-ray (mammogram) every year.  If you have a family history of breast cancer, talk to your health care provider about genetic screening.  If you are at high risk for breast cancer, talk to your health care provider about having an MRI and a mammogram every year.  Breast cancer gene (BRCA) assessment is recommended for women who have family members with BRCA-related cancers. BRCA-related cancers include: ? Breast. ? Ovarian. ? Tubal. ? Peritoneal cancers.  Results of the assessment will determine the need for genetic counseling and BRCA1 and BRCA2 testing.  Cervical Cancer Your health care provider may recommend that you be screened regularly for cancer of the pelvic organs (ovaries, uterus, and vagina). This screening involves a pelvic examination, including checking for microscopic changes to the surface of your cervix  (Pap test). You may be encouraged to have this screening done every 3 years, beginning at age 101.  For women ages 33-65, health care providers may recommend pelvic exams and Pap testing every 3 years, or they may recommend the Pap and pelvic exam, combined with testing for human papilloma virus (HPV), every 5 years. Some types of HPV increase your risk of cervical cancer. Testing for HPV may also be done on women of any age with unclear Pap test results.  Other health care providers may not recommend any screening for nonpregnant women who are considered low risk for pelvic cancer and who do not have symptoms. Ask your health care provider if a screening pelvic exam is right for you.  If you have had past treatment for cervical cancer or a  condition that could lead to cancer, you need Pap tests and screening for cancer for at least 20 years after your treatment. If Pap tests have been discontinued, your risk factors (such as having a new sexual partner) need to be reassessed to determine if screening should resume. Some women have medical problems that increase the chance of getting cervical cancer. In these cases, your health care provider may recommend more frequent screening and Pap tests.  Colorectal Cancer  This type of cancer can be detected and often prevented.  Routine colorectal cancer screening usually begins at 43 years of age and continues through 43 years of age.  Your health care provider may recommend screening at an earlier age if you have risk factors for colon cancer.  Your health care provider may also recommend using home test kits to check for hidden blood in the stool.  A small camera at the end of a tube can be used to examine your colon directly (sigmoidoscopy or colonoscopy). This is done to check for the earliest forms of colorectal cancer.  Routine screening usually begins at age 71.  Direct examination of the colon should be repeated every 5-10 years through 43 years  of age. However, you may need to be screened more often if early forms of precancerous polyps or small growths are found.  Skin Cancer  Check your skin from head to toe regularly.  Tell your health care provider about any new moles or changes in moles, especially if there is a change in a mole's shape or color.  Also tell your health care provider if you have a mole that is larger than the size of a pencil eraser.  Always use sunscreen. Apply sunscreen liberally and repeatedly throughout the day.  Protect yourself by wearing long sleeves, pants, a wide-brimmed hat, and sunglasses whenever you are outside.  Heart disease, diabetes, and high blood pressure  High blood pressure causes heart disease and increases the risk of stroke. High blood pressure is more likely to develop in: ? People who have blood pressure in the high end of the normal range (130-139/85-89 mm Hg). ? People who are overweight or obese. ? People who are African American.  If you are 13-35 years of age, have your blood pressure checked every 3-5 years. If you are 43 years of age or older, have your blood pressure checked every year. You should have your blood pressure measured twice-once when you are at a hospital or clinic, and once when you are not at a hospital or clinic. Record the average of the two measurements. To check your blood pressure when you are not at a hospital or clinic, you can use: ? An automated blood pressure machine at a pharmacy. ? A home blood pressure monitor.  If you are between 42 years and 73 years old, ask your health care provider if you should take aspirin to prevent strokes.  Have regular diabetes screenings. This involves taking a blood sample to check your fasting blood sugar level. ? If you are at a normal weight and have a low risk for diabetes, have this test once every three years after 43 years of age. ? If you are overweight and have a high risk for diabetes, consider being tested  at a younger age or more often. Preventing infection Hepatitis B  If you have a higher risk for hepatitis B, you should be screened for this virus. You are considered at high risk for hepatitis B if: ? You  were born in a country where hepatitis B is common. Ask your health care provider which countries are considered high risk. ? Your parents were born in a high-risk country, and you have not been immunized against hepatitis B (hepatitis B vaccine). ? You have HIV or AIDS. ? You use needles to inject street drugs. ? You live with someone who has hepatitis B. ? You have had sex with someone who has hepatitis B. ? You get hemodialysis treatment. ? You take certain medicines for conditions, including cancer, organ transplantation, and autoimmune conditions.  Hepatitis C  Blood testing is recommended for: ? Everyone born from [birth year] through 1967. ? Anyone with known risk factors for hepatitis C.  Sexually transmitted infections (STIs)  You should be screened for sexually transmitted infections (STIs) including gonorrhea and chlamydia if: ? You are sexually active and are younger than 43 years of age. ? You are older than 43 years of age and your health care provider tells you that you are at risk for this type of infection. ? Your sexual activity has changed since you were last screened and you are at an increased risk for chlamydia or gonorrhea. Ask your health care provider if you are at risk.  If you do not have HIV, but are at risk, it may be recommended that you take a prescription medicine daily to prevent HIV infection. This is called pre-exposure prophylaxis (PrEP). You are considered at risk if: ? You are sexually active and do not regularly use condoms or know the HIV status of your partner(s). ? You take drugs by injection. ? You are sexually active with a partner who has HIV.  Talk with your health care provider about whether you are at high risk of being infected with HIV. If  you choose to begin PrEP, you should first be tested for HIV. You should then be tested every 3 months for as long as you are taking PrEP. Pregnancy  If you are premenopausal and you may become pregnant, ask your health care provider about preconception counseling.  If you may become pregnant, take 400 to 800 micrograms (mcg) of folic acid every day.  If you want to prevent pregnancy, talk to your health care provider about birth control (contraception). Osteoporosis and menopause  Osteoporosis is a disease in which the bones lose minerals and strength with aging. This can result in serious bone fractures. Your risk for osteoporosis can be identified using a bone density scan.  If you are 60 years of age or older, or if you are at risk for osteoporosis and fractures, ask your health care provider if you should be screened.  Ask your health care provider whether you should take a calcium or vitamin D supplement to lower your risk for osteoporosis.  Menopause may have certain physical symptoms and risks.  Hormone replacement therapy may reduce some of these symptoms and risks. Talk to your health care provider about whether hormone replacement therapy is right for you. Follow these instructions at home:  Schedule regular health, dental, and eye exams.  Stay current with your immunizations.  Do not use any tobacco products including cigarettes, chewing tobacco, or electronic cigarettes.  If you are pregnant, do not drink alcohol.  If you are breastfeeding, limit how much and how often you drink alcohol.  Limit alcohol intake to no more than 1 drink per day for nonpregnant women. One drink equals 12 ounces of beer, 5 ounces of wine, or 1 ounces of hard liquor.  Do not use street drugs.  Do not share needles.  Ask your health care provider for help if you need support or information about quitting drugs.  Tell your health care provider if you often feel depressed.  Tell your  health care provider if you have ever been abused or do not feel safe at home. This information is not intended to replace advice given to you by your health care provider. Make sure you discuss any questions you have with your health care provider. Document Released: 10/03/2010 Document Revised: 08/26/2015 Document Reviewed: 12/22/2014 Elsevier Interactive Patient Education  [employer].

## 2016-12-22 LAB — VITAMIN D 25 HYDROXY (VIT D DEFICIENCY, FRACTURES): VIT D 25 HYDROXY: 30 ng/mL (ref 30–100)

## 2016-12-22 LAB — LIPID PANEL
CHOLESTEROL: 229 mg/dL — AB (ref <200)
HDL: 51 mg/dL (ref >50)
LDL CHOLESTEROL (CALC): 150 mg/dL — AB
Non-HDL Cholesterol (Calc): 178 mg/dL (calc) — ABNORMAL HIGH (ref <130)
TRIGLYCERIDES: 150 mg/dL — AB (ref <150)
Total CHOL/HDL Ratio: 4.5 (calc) (ref <5.0)

## 2016-12-22 LAB — COMPREHENSIVE METABOLIC PANEL
AG RATIO: 1.6 (calc) (ref 1.0–2.5)
ALKALINE PHOSPHATASE (APISO): 37 U/L (ref 33–115)
ALT: 18 U/L (ref 6–29)
AST: 16 U/L (ref 10–30)
Albumin: 4.3 g/dL (ref 3.6–5.1)
BILIRUBIN TOTAL: 1.3 mg/dL — AB (ref 0.2–1.2)
BUN: 9 mg/dL (ref 7–25)
CALCIUM: 9 mg/dL (ref 8.6–10.2)
CO2: 18 mmol/L — AB (ref 20–32)
Chloride: 110 mmol/L (ref 98–110)
Creat: 0.78 mg/dL (ref 0.50–1.10)
Globulin: 2.7 g/dL (calc) (ref 1.9–3.7)
Glucose, Bld: 87 mg/dL (ref 65–99)
Potassium: 4 mmol/L (ref 3.5–5.3)
SODIUM: 136 mmol/L (ref 135–146)
TOTAL PROTEIN: 7 g/dL (ref 6.1–8.1)

## 2016-12-22 LAB — TSH: TSH: 1.3 m[IU]/L

## 2016-12-22 LAB — CBC WITH DIFFERENTIAL/PLATELET
BASOS ABS: 31 {cells}/uL (ref 0–200)
Basophils Relative: 0.5 %
EOS PCT: 1 %
Eosinophils Absolute: 62 cells/uL (ref 15–500)
HCT: 35.9 % (ref 35.0–45.0)
Hemoglobin: 11.6 g/dL — ABNORMAL LOW (ref 11.7–15.5)
Lymphs Abs: 2182 cells/uL (ref 850–3900)
MCH: 28.2 pg (ref 27.0–33.0)
MCHC: 32.3 g/dL (ref 32.0–36.0)
MCV: 87.3 fL (ref 80.0–100.0)
MONOS PCT: 8.2 %
MPV: 10.9 fL (ref 7.5–12.5)
NEUTROS PCT: 55.1 %
Neutro Abs: 3416 cells/uL (ref 1500–7800)
PLATELETS: 319 10*3/uL (ref 140–400)
RBC: 4.11 10*6/uL (ref 3.80–5.10)
RDW: 12.5 % (ref 11.0–15.0)
Total Lymphocyte: 35.2 %
WBC mixed population: 508 cells/uL (ref 200–950)
WBC: 6.2 10*3/uL (ref 3.8–10.8)

## 2017-01-03 ENCOUNTER — Ambulatory Visit (INDEPENDENT_AMBULATORY_CARE_PROVIDER_SITE_OTHER): Payer: 59 | Admitting: Family Medicine

## 2017-01-03 ENCOUNTER — Ambulatory Visit (INDEPENDENT_AMBULATORY_CARE_PROVIDER_SITE_OTHER): Payer: 59

## 2017-01-03 ENCOUNTER — Encounter: Payer: Self-pay | Admitting: Family Medicine

## 2017-01-03 VITALS — BP 144/94 | HR 70 | Temp 97.8°F | Resp 18 | Ht 63.0 in | Wt 203.0 lb

## 2017-01-03 DIAGNOSIS — R1084 Generalized abdominal pain: Secondary | ICD-10-CM

## 2017-01-03 DIAGNOSIS — R1013 Epigastric pain: Secondary | ICD-10-CM | POA: Diagnosis not present

## 2017-01-03 LAB — POCT CBC
Granulocyte percent: 47.1 %G (ref 37–80)
HEMATOCRIT: 37.5 % — AB (ref 37.7–47.9)
Hemoglobin: 12.6 g/dL (ref 12.2–16.2)
LYMPH, POC: 2.9 (ref 0.6–3.4)
MCH, POC: 30 pg (ref 27–31.2)
MCHC: 33.7 g/dL (ref 31.8–35.4)
MCV: 89 fL (ref 80–97)
MID (CBC): 0.2 (ref 0–0.9)
MPV: 8.2 fL (ref 0–99.8)
POC GRANULOCYTE: 2.8 (ref 2–6.9)
POC LYMPH %: 49.1 % (ref 10–50)
POC MID %: 3.8 % (ref 0–12)
Platelet Count, POC: 330 10*3/uL (ref 142–424)
RBC: 4.22 M/uL (ref 4.04–5.48)
RDW, POC: 13.4 %
WBC: 6 10*3/uL (ref 4.6–10.2)

## 2017-01-03 LAB — POC MICROSCOPIC URINALYSIS (UMFC): MUCUS RE: ABSENT

## 2017-01-03 LAB — POCT URINALYSIS DIP (MANUAL ENTRY)
BILIRUBIN UA: NEGATIVE
BILIRUBIN UA: NEGATIVE mg/dL
Blood, UA: NEGATIVE
GLUCOSE UA: NEGATIVE mg/dL
LEUKOCYTES UA: NEGATIVE
Nitrite, UA: NEGATIVE
PH UA: 7 (ref 5.0–8.0)
Protein Ur, POC: NEGATIVE mg/dL
Spec Grav, UA: 1.02 (ref 1.010–1.025)
Urobilinogen, UA: 0.2 E.U./dL

## 2017-01-03 LAB — POCT URINE PREGNANCY: Preg Test, Ur: NEGATIVE

## 2017-01-03 LAB — LIPASE: Lipase: 19 U/L (ref 14–72)

## 2017-01-03 LAB — POC HEMOCCULT BLD/STL (OFFICE/1-CARD/DIAGNOSTIC): FECAL OCCULT BLD: NEGATIVE

## 2017-01-03 MED ORDER — GI COCKTAIL ~~LOC~~
30.0000 mL | Freq: Once | ORAL | Status: AC
  Administered 2017-01-03: 30 mL via ORAL

## 2017-01-03 MED ORDER — SUCRALFATE 1 G PO TABS: 1.0000 g | ORAL_TABLET | Freq: Three times a day (TID) | ORAL | 1 refills | Status: DC

## 2017-01-03 MED ORDER — OMEPRAZOLE 40 MG PO CPDR: 40.0000 mg | DELAYED_RELEASE_CAPSULE | Freq: Two times a day (BID) | ORAL | 0 refills | Status: DC

## 2017-01-03 NOTE — Progress Notes (Signed)
   Subjective:    [name], female    DOB: 03-21-1976, 41 y.o.   MRN: [id number]   Chief Complaint  Patient presents with  . Abdominal Pain    since sunday   . Nausea     HPI    Review of Systems     Objective:   Physical Exam        Assessment & Plan:

## 2017-01-03 NOTE — Progress Notes (Signed)
Subjective:    [name], female    DOB: [birth year], 41 y.o.   MRN: [id number] Chief Complaint  Patient presents with  . Abdominal Pain    since sunday   . Nausea    HPI Bilateral abd pain started 9.19 while urinating. Bowels normal. No other sxs.  On Monday - 3d prior - she started having abd pain epigastric - like when you take medicine on an empty stomach is constant. Gets nauseas when talking a lot. Dull wave of pain additionally that radiate around both sides to her back. Had an episode of diaphroesis yesterday. Very fatigued. Has been able to eat - yesterday she felt a little better after eating - for < an hour - then worse. Not worse after eating.   Bowels abut the same - takes colon cleaner and mag pill to avoid constipation. BM sev/d at baseline. Normal quality. No GERD.  Initially started with right back pain around scapula but she gets this pain occasionally - "her back goes out on her. " gets very stiff and can't moved. Back pain started SUn and resolved Monday. Had to miss work Mon as was feeling ill.  Has been taking tylenol and aleve for the back pain  No melena. Normal urination.   Past Medical History:  Diagnosis Date  . Allergy   . Anemia   . Anxiety   . Arthritis   . Chronic headache disorder    s/p Meningitis x 2, tension- type plus allergic/sinus problems  . History of abnormal Pap smear   . Meningitis, viral At age 41 and at age 54  . Nasal fracture 2011  . Other and unspecified ovarian cysts 2011- pelvic CT   GYN- Dr. Chevis Pretty  . Vision abnormalities    Past Surgical History:  Procedure Laterality Date  . BACK SURGERY  02/09/15   L5 - S1 diskectomy  . FOOT SURGERY  s/p 2 procedures  . LAPAROSCOPY FOR ECTOPIC PREGNANCY  1998   Fallopian tube removed  . MOUTH SURGERY    . spinal injection  03/10/15  . SPINE SURGERY    . UTERINE ARTERY EMBOLIZATION     Current Outpatient Prescriptions on File Prior to Visit  Medication Sig Dispense  Refill  . azelastine (ASTELIN) 0.1 % nasal spray Place 2 sprays into both nostrils 2 (two) times daily. In addition to the rhinocort 30 mL 12  . budesonide (RHINOCORT AQUA) 32 MCG/ACT nasal spray Place 2 sprays into both nostrils daily. 1 Bottle 12  . etonogestrel (NEXPLANON) 68 MG IMPL implant Inject 1 each into the skin once.    . meloxicam (MOBIC) 15 MG tablet Take 1 tablet (15 mg total) by mouth daily. As needed for pain. Do not use with any other otc pain medication. 90 tablet 1  . omeprazole (PRILOSEC) 40 MG capsule TAKE ONE CAPSULE BY MOUTH EVERY DAY 30 capsule 3  . propranolol (INDERAL) 20 MG tablet Take 1 tablet (20 mg total) by mouth 2 (two) times daily. 60 tablet 6  . ramelteon (ROZEREM) 8 MG tablet Take 8 mg by mouth at bedtime. occas as needed    . rizatriptan (MAXALT-MLT) 10 MG disintegrating tablet Take 1 tablet (10 mg total) by mouth as needed for migraine. May repeat in 2 hours if needed 9 tablet 11  . topiramate (TOPAMAX) 100 MG tablet Take 1 tablet (100 mg total) by mouth 2 (two) times daily. 180 tablet 4  . valACYclovir (VALTREX) 500 MG tablet Take  1 tablet (500 mg total) by mouth 2 (two) times daily. To prevent recurrence of viral meningitis 60 tablet 2   No current facility-administered medications on file prior to visit.    No Known Allergies Family History  Problem Relation Age of Onset  . Migraines Father        cluster headache  . Hyperlipidemia Father   . Hypertension Father   . Cancer Mother 30       Breast  . Stroke Maternal Grandmother   . Diabetes Paternal Grandmother   . Diabetes Unknown        paternal relaitve  . Seizures Unknown        maternal relative  . Rheum arthritis Unknown        maternal relatives   Social History   Social History  . Marital status: Married    Spouse name: Jesusita Oka  . Number of children: N/A  . Years of education: N/A   Occupational History  . [employer]   Social History Main Topics  . Smoking  status: Former Games developer  . Smokeless tobacco: Never Used  . Alcohol use 0.0 oz/week  . Drug use: No  . Sexual activity: Not Asked   Other Topics Concern  . None   Social History Narrative   Drinks about 12-16oz of coffee a day, occasionally drinks a tea.   [employer]oks on computer as Systems developer.    Depression screen [facility] 2/9 01/03/2017 12/21/2016 02/29/2016 12/20/2015 12/16/2014  Decreased Interest 0 0 0 0 0  Down, Depressed, Hopeless 0 0 0 0 0  PHQ - 2 Score 0 0 0 0 0  Altered sleeping - - - - -  Tired, decreased energy - - - - -  Change in appetite - - - - -  Feeling bad or failure about yourself  - - - - -  Trouble concentrating - - - - -  Moving slowly or fidgety/restless - - - - -  Suicidal thoughts - - - - -  PHQ-9 Score - - - - -     Review of Systems See hpi    Objective:   Physical Exam  Constitutional: She is oriented to person, place, and time. She appears well-developed and well-nourished. She appears listless. She appears ill. No distress.  HENT:  Head: Normocephalic and atraumatic.  Neck: Normal range of motion. Neck supple. No thyromegaly present.  Cardiovascular: Normal rate, regular rhythm, normal heart sounds and intact distal pulses.   Pulmonary/Chest: Effort normal and breath sounds normal. No respiratory distress.  Abdominal: Soft. She exhibits no distension and no mass. Bowel sounds are decreased. There is no hepatosplenomegaly. There is generalized tenderness. There is no rebound, no guarding, no CVA tenderness and negative Murphy's sign.  Musculoskeletal: She exhibits no edema.  Lymphadenopathy:    She has no cervical adenopathy.  Neurological: She is oriented to person, place, and time. She appears listless.  Skin: Skin is warm and dry. She is not diaphoretic. No erythema.  Psychiatric: She has a normal mood and affect. Her behavior is normal.  Hypoactive BS, tympantic bowels in chest    Pulse 70   Temp 97.8 F (36.6 C) (Oral)   Resp 18   Ht  (1.6 m)    Wt 203 lb (92.1 kg)   SpO2 100%   BMI 35.96 kg/m   Results for orders placed or performed in visit on 01/03/17  POCT urinalysis dipstick  Result Value Ref Range   Color,  UA yellow yellow   Clarity, UA clear clear   Glucose, UA negative negative mg/dL   Bilirubin, UA negative negative   Ketones, POC UA negative negative mg/dL   Spec Grav, UA 1.610 9.604 - 1.025   Blood, UA negative negative   pH, UA 7.0 5.0 - 8.0   Protein Ur, POC negative negative mg/dL   Urobilinogen, UA 0.2 0.2 or 1.0 E.U./dL   Nitrite, UA Negative Negative   Leukocytes, UA Negative Negative  POCT Microscopic Urinalysis ([facility])  Result Value Ref Range   WBC,UR,HPF,POC None None WBC/hpf   RBC,UR,HPF,POC None None RBC/hpf   Bacteria None None, Too numerous to count   Mucus Absent Absent   Epithelial Cells, UR Per Microscopy Few (A) None, Too numerous to count cells/hpf  POCT CBC  Result Value Ref Range   WBC 6.0 4.6 - 10.2 K/uL   Lymph, poc 2.9 0.6 - 3.4   POC LYMPH PERCENT 49.1 10 - 50 %L   MID (cbc) 0.2 0 - 0.9   POC MID % 3.8 0 - 12 %M   POC Granulocyte 2.8 2 - 6.9   Granulocyte percent 47.1 37 - 80 %G   RBC 4.22 4.04 - 5.48 M/uL   Hemoglobin 12.6 12.2 - 16.2 g/dL   HCT, POC 54.0 (A) 98.1 - 47.9 %   MCV 89.0 80 - 97 fL   MCH, POC 30.0 27 - 31.2 pg   MCHC 33.7 31.8 - 35.4 g/dL   RDW, POC 19.1 %   Platelet Count, POC 330 142 - 424 K/uL   MPV 8.2 0 - 99.8 fL   Dg Abd Acute W/chest  Result Date: 01/03/2017 CLINICAL DATA:  Acute onset of epigastric pain radiating to the back. EXAM: DG ABDOMEN ACUTE W/ 1V CHEST COMPARISON:  Chest x-ray of February 29, 2016 FINDINGS: The lungs are well-expanded and clear. The heart and pulmonary vascularity are normal. The mediastinum is normal in width. There is no pleural effusion. The bony thorax is unremarkable. Within the abdomen the bowel gas pattern is normal. The stool burden is not excessive. On 2 of the three views there is a 2 mm diameter rounded calcific density  that projects over the right renal pelvis. It is not observed on the third abdominal image. The bony structures are unremarkable. IMPRESSION: Possible 2 mm stone in the renal pelvis of the right kidney. However, this is seen on 2 of 3 images only. Correlation with any urinary tract symptoms and the patient's urinalysis is needed. Otherwise no acute intra-abdominal abnormality is observed. There is no acute cardiopulmonary abnormality. Electronically Signed   By: David  [place] M.D.   On: 01/03/2017 14:56     Assessment & Plan:  Consider [place] vs CT scan. - r/o gallstones but poss kidney stone.  If not sig improve/resolve in next sev d -> get CT scan  1. Epigastric pain     Orders Placed This Encounter  Procedures  . DG Abd Acute W/Chest    Standing Status:   Future    Number of Occurrences:   1    Standing Expiration Date:   01/03/2018    Order Specific Question:   Reason for exam:    Answer:   acute constant epigastric pain radiating bilaterally around to back    Order Specific Question:   Is the patient pregnant?    Answer:   No    Order Specific Question:   Preferred imaging location?    Answer:   External  .  Comprehensive metabolic panel  . Lipase  . H. pylori breath test  . POCT urinalysis dipstick  . POCT Microscopic Urinalysis ([facility])  . POCT CBC  . POC Hemoccult Bld/Stl (1-Cd Office Dx)  . POCT urine pregnancy    Meds ordered this encounter  Medications  . gi cocktail (Maalox,Lidocaine,Donnatal)  . sucralfate (CARAFATE) 1 g tablet    Sig: Take 1 tablet (1 g total) by mouth 4 (four) times daily -  with meals and at bedtime.    Dispense:  60 tablet    Refill:  1  . omeprazole (PRILOSEC) 40 MG capsule    Sig: Take 1 capsule (40 mg total) by mouth 2 (two) times daily before a meal. By 30 minutes    Dispense:  60 capsule    Refill:  0    [name], M.D.  Primary Care at [facility] [address] [place], [place] [address] [phone] phone [phone]  fax  01/05/17 11:30 PM

## 2017-01-03 NOTE — Patient Instructions (Addendum)
IF you received an x-ray today, you will receive an invoice from [facility]. Please contact [facility] at [phone] with questions or concerns regarding your invoice.   IF you received labwork today, you will receive an invoice from [facility]. Please contact [facility] at [phone] with questions or concerns regarding your invoice.   Our billing staff will not be able to assist you with questions regarding bills from these companies.  You will be contacted with the lab results as soon as they are available. The fastest way to get your results is to activate your My Chart account. Instructions are located on the last page of this paperwork. If you have not heard from [place] regarding the results in 2 weeks, please contact this office.      Gastritis, Adult Gastritis is inflammation of the stomach. There are two kinds of gastritis:  Acute gastritis. This kind develops suddenly.  Chronic gastritis. This kind lasts for a long time.  Gastritis happens when the lining of the stomach becomes weak or gets damaged. Without treatment, gastritis can lead to stomach bleeding and ulcers. What are the causes? This condition may be caused by:  An infection.  Drinking too much alcohol.  Certain medicines.  Having too much acid in the stomach.  A disease of the intestines or stomach.  Stress.  What are the signs or symptoms? Symptoms of this condition include:  Pain or a burning in the upper abdomen.  Nausea.  Vomiting.  An uncomfortable feeling of fullness after eating.  In some cases, there are no symptoms. How is this diagnosed? This condition may be diagnosed with:  A description of your symptoms.  A physical exam.  Tests. These can include: ? Blood tests. ? Stool tests. ? A test in which a thin, flexible instrument with a light and camera on the end is passed down the esophagus and into the stomach (upper endoscopy). ? A test in which a sample  of tissue is taken for testing (biopsy).  How is this treated? This condition may be treated with medicines. If the condition is caused by a bacterial infection, you may be given antibiotic medicines. If it is caused by too much acid in the stomach, you may get medicines called H2 blockers, proton pump inhibitors, or antacids. Treatment may also involve stopping the use of certain medicines, such as aspirin, ibuprofen, or other nonsteroidal anti-inflammatory drugs (NSAIDs). Follow these instructions at home:  Take over-the-counter and prescription medicines only as told by your health care provider.  If you were prescribed an antibiotic, take it as told by your health care provider. Do not stop taking the antibiotic even if you start to feel better.  Drink enough fluid to keep your urine clear or pale yellow.  Eat small, frequent meals instead of large meals. Contact a health care provider if:  Your symptoms get worse.  Your symptoms return after treatment. Get help right away if:  You vomit blood or material that looks like coffee grounds.  You have black or dark red stools.  You are unable to keep fluids down.  Your abdominal pain gets worse.  You have a fever.  You do not feel better after 1 week. This information is not intended to replace advice given to you by your health care provider. Make sure you discuss any questions you have with your health care provider. Document Released: 03/14/2001 Document Revised: 11/17/2015 Document Reviewed: 12/12/2014 Elsevier Interactive Patient Education  [employer].  Peptic Ulcer A peptic ulcer is a sore in the lining of the esophagus (esophageal ulcer), the stomach (gastric ulcer), or the first part of the small intestine (duodenal ulcer). The ulcer causes gradual wearing away (erosion) into the deeper tissue. What are the causes? Normally, the lining of the stomach and the small intestine protects itself from the acid that  digests food. The protective lining can be damaged by:  An infection caused by a germ (bacterium) called Helicobacter pylori or H. pylori.  Regular use of NSAIDs, such as ibuprofen or aspirin.  Rare tumors in the stomach, small intestine, or pancreas (Zollinger-Ellison syndrome).  What increases the risk? The following factors may make you more likely to develop this condition:  Smoking.  Having a family history of ulcer disease.  What are the signs or symptoms? Symptoms of this condition include:  Burning pain or gnawing in the area between the chest and the belly button. The pain may be worse on an empty stomach and at night.  Heartburn.  Nausea and vomiting.  Bloating.  If the ulcer results in bleeding, it can cause:  Black, tarry stools.  Vomiting of bright red blood.  Vomiting of material that looks like coffee grounds.  How is this diagnosed? This condition may be diagnosed based on:  Medical history and physical exam.  Various tests or procedures, such as: ? Blood tests, stool tests, or breath tests to check for the H. pylori bacterium. ? An X-ray exam (upper gastrointestinal series) of the esophagus, stomach, and small intestine. ? Upper endoscopy. The health care provider examines the esophagus, stomach, and small intestine using a small flexible tube that has a video camera at the end. ? Biopsy. A tissue sample is removed to be examined under a microscope.  How is this treated? Treatment for this condition may include:  Eliminating the cause of the ulcer, such as smoking or the use of NSAIDs or alcohol.  Medicines to reduce the amount of acid in your digestive tract.  Antibiotic medicines, if the ulcer is caused by the H. pylori bacterium.  An upper endoscopy to treat a bleeding ulcer.  Surgery, if the bleeding is severe or if the ulcer created a hole somewhere in the digestive system.  Follow these instructions at home:  Avoid alcohol and  caffeine.  Do not use any tobacco products, such as cigarettes, chewing tobacco, and e-cigarettes. If you need help quitting, ask your health care provider.  Take over-the-counter and prescription medicines only as told by your health care provider. Do not use over-the-counter medicines in place of prescription medicines unless your health care provider approves.  Keep all follow-up visits as told by your health care provider. This is important. Contact a health care provider if:  Your symptoms do not improve within 7 days of starting treatment.  You have ongoing indigestion or heartburn. Get help right away if:  You have sudden, sharp, or persistent pain in your abdomen.  You have bloody or dark black, tarry stools.  You vomit blood or material that looks like coffee grounds.  You become light-headed or you feel faint.  You become weak.  You become sweaty or clammy. This information is not intended to replace advice given to you by your health care provider. Make sure you discuss any questions you have with your health care provider. Document Released: 03/17/2000 Document Revised: 08/23/2015 Document Reviewed: 12/19/2014 Elsevier Interactive Patient Education  2018 [employer]. Food Choices for Peptic Ulcer Disease When you have  peptic ulcer disease, the foods you eat and your eating habits are very important. Choosing the right foods can help ease the discomfort of peptic ulcer disease. What general guidelines do I need to follow?  Choose fruits, vegetables, whole grains, and low-fat meat, fish, and poultry.  Keep a food diary to identify foods that cause symptoms.  Avoid foods that cause irritation or pain. These may be different for different people.  Eat frequent small meals instead of three large meals each day. The pain may be worse when your stomach is empty.  Avoid eating close to bedtime. What foods are not recommended? The following are some foods and drinks  that may worsen your symptoms:  Black, white, and red pepper.  Hot sauce.  Chili peppers.  Chili powder.  Chocolate and cocoa.  Alcohol.  Tea, coffee, and cola (regular and decaffeinated).  The items listed above may not be a complete list of foods and beverages to avoid. Contact your dietitian for more information. This information is not intended to replace advice given to you by your health care provider. Make sure you discuss any questions you have with your health care provider. Document Released: 06/12/2011 Document Revised: 08/26/2015 Document Reviewed: 01/22/2013 Elsevier Interactive Patient Education  2017 [employer].

## 2017-01-04 LAB — COMPREHENSIVE METABOLIC PANEL
ALBUMIN: 4.3 g/dL (ref 3.5–5.5)
ALT: 22 IU/L (ref 0–32)
AST: 19 IU/L (ref 0–40)
Albumin/Globulin Ratio: 1.5 (ref 1.2–2.2)
Alkaline Phosphatase: 46 IU/L (ref 39–117)
BUN / CREAT RATIO: 14 (ref 9–23)
BUN: 10 mg/dL (ref 6–24)
Bilirubin Total: 0.9 mg/dL (ref 0.0–1.2)
CALCIUM: 9.4 mg/dL (ref 8.7–10.2)
CO2: 18 mmol/L — AB (ref 20–29)
CREATININE: 0.72 mg/dL (ref 0.57–1.00)
Chloride: 109 mmol/L — ABNORMAL HIGH (ref 96–106)
GFR calc Af Amer: 119 mL/min/{1.73_m2} (ref >59)
GFR, EST NON AFRICAN AMERICAN: 103 mL/min/{1.73_m2} (ref >59)
Globulin, Total: 2.9 g/dL (ref 1.5–4.5)
Glucose: 89 mg/dL (ref 65–99)
Potassium: 4.2 mmol/L (ref 3.5–5.2)
SODIUM: 140 mmol/L (ref 134–144)
TOTAL PROTEIN: 7.2 g/dL (ref 6.0–8.5)

## 2017-01-04 LAB — H. PYLORI BREATH TEST: H PYLORI BREATH TEST: NEGATIVE

## 2017-01-06 ENCOUNTER — Ambulatory Visit (HOSPITAL_COMMUNITY)
Admission: RE | Admit: 2017-01-06 | Discharge: 2017-01-06 | Disposition: A | Discharge status: Home / Self Care | Payer: 59 | Source: Ambulatory Visit | Attending: Family Medicine | Admitting: Family Medicine

## 2017-01-06 ENCOUNTER — Ambulatory Visit (INDEPENDENT_AMBULATORY_CARE_PROVIDER_SITE_OTHER): Payer: 59 | Admitting: Family Medicine

## 2017-01-06 ENCOUNTER — Encounter: Payer: Self-pay | Admitting: Family Medicine

## 2017-01-06 VITALS — BP 110/82

## 2017-01-06 DIAGNOSIS — R1111 Vomiting without nausea: Secondary | ICD-10-CM | POA: Diagnosis not present

## 2017-01-06 DIAGNOSIS — R1013 Epigastric pain: Secondary | ICD-10-CM | POA: Insufficient documentation

## 2017-01-06 DIAGNOSIS — R1011 Right upper quadrant pain: Secondary | ICD-10-CM

## 2017-01-06 LAB — POC MICROSCOPIC URINALYSIS (UMFC)

## 2017-01-06 LAB — POCT URINALYSIS DIP (MANUAL ENTRY)
BILIRUBIN UA: NEGATIVE
Blood, UA: NEGATIVE
Glucose, UA: NEGATIVE mg/dL
Ketones, POC UA: NEGATIVE mg/dL
LEUKOCYTES UA: NEGATIVE
NITRITE UA: NEGATIVE
PH UA: 7 (ref 5.0–8.0)
PROTEIN UA: NEGATIVE mg/dL
Spec Grav, UA: 1.02 (ref 1.010–1.025)
UROBILINOGEN UA: 1 U/dL

## 2017-01-06 LAB — POCT CBC
Granulocyte percent: 36.4 %G — AB (ref 37–80)
HCT, POC: 34.2 % — AB (ref 37.7–47.9)
HEMOGLOBIN: 11.2 g/dL — AB (ref 12.2–16.2)
LYMPH, POC: 3.7 — AB (ref 0.6–3.4)
MCH, POC: 28.6 pg (ref 27–31.2)
MCHC: 32.8 g/dL (ref 31.8–35.4)
MCV: 87.2 fL (ref 80–97)
MID (cbc): 0.5 (ref 0–0.9)
MPV: 8.5 fL (ref 0–99.8)
PLATELET COUNT, POC: 226 10*3/uL (ref 142–424)
POC Granulocyte: 2.4 (ref 2–6.9)
POC LYMPH PERCENT: 55.4 %L — AB (ref 10–50)
POC MID %: 8.2 % (ref 0–12)
RBC: 3.92 M/uL — AB (ref 4.04–5.48)
RDW, POC: 14.2 %
WBC: 6.7 10*3/uL (ref 4.6–10.2)

## 2017-01-06 MED ORDER — ONDANSETRON 8 MG PO TBDP: 8.0000 mg | ORAL_TABLET | Freq: Three times a day (TID) | ORAL | 0 refills | Status: DC | PRN

## 2017-01-06 MED ORDER — HYDROCODONE-ACETAMINOPHEN 5-325 MG PO TABS: 1.0000 | ORAL_TABLET | ORAL | 0 refills | Status: DC | PRN

## 2017-01-06 NOTE — Addendum Note (Signed)
Addended by: Sherren Mocha on: 01/06/2017 06:49 PM   Modules accepted: Orders

## 2017-01-06 NOTE — Progress Notes (Signed)
Subjective:  By signing my name below, I, Angela Farrell, attest that this documentation has been prepared under the direction and in the presence of [name], MD. Electronically Signed: Stann Farrell, Scribe. 01/06/2017 , 4:08 PM .  Patient was seen in Room 1 .   [name], female    DOB: 13-Jul-1975, 41 y.o.   MRN: [id number] Chief Complaint  Patient presents with  . Follow-up    right sied front and back pain is a little better 4/-5/10 on pain scale   HPI Angela Farrell is a 41 y.o. female who presents to Primary Care at [facility] for follow up of abdominal pain. Patient was seen 3 days ago with now 1 week of abdominal pain and nausea. She had mild improvement of epigastric pain with GI cocktail in office. Her pain did start while urinating with nausea and dull waves of pain radiating from her epigastric to her back bilaterally. She notes being very fatigued but with normal appetite. She denies GERD, and with normal bowels. Patient was started on Prilosec BID with Carafate QID.   Her work up included normal urine, CBC, abdominal xray, CMP, and lipase. Her H. Pylori was negative, as well as negative hemoccult and negative urine pregnancy.   Patient reports having a headache all last night. She's still having waves of abdominal pain and a constant ache in her right sided low back that radiates into the front. She denies history of kidney stone. She's been burping, but without any relief after burping. She denies any changes with her urine or bowels. She denies any vaginal discharge or bleeding.   Past Medical History:  Diagnosis Date  . Allergy   . Anemia   . Anxiety   . Arthritis   . Chronic headache disorder    s/p Meningitis x 2, tension- type plus allergic/sinus problems  . History of abnormal Pap smear   . Meningitis, viral At age 89 and at age 59  . Nasal fracture 2011  . Other and unspecified ovarian cysts 2011- pelvic CT   GYN- Dr. Chevis Pretty  . Vision abnormalities     Prior to Admission medications   Medication Sig Start Date End Date Taking? Authorizing Provider  azelastine (ASTELIN) 0.1 % nasal spray Place 2 sprays into both nostrils 2 (two) times daily. In addition to the rhinocort 12/21/16  Yes Sherren Mocha, MD  budesonide (RHINOCORT AQUA) 32 MCG/ACT nasal spray Place 2 sprays into both nostrils daily. 12/21/16  Yes Sherren Mocha, MD  etonogestrel (NEXPLANON) 68 MG IMPL implant Inject 1 each into the skin once.   Yes [provider]  meloxicam (MOBIC) 15 MG tablet Take 1 tablet (15 mg total) by mouth daily. As needed for pain. Do not use with any other otc pain medication. 12/21/16  Yes Sherren Mocha, MD  omeprazole (PRILOSEC) 40 MG capsule Take 1 capsule (40 mg total) by mouth 2 (two) times daily before a meal. By 30 minutes 01/03/17  Yes Sherren Mocha, MD  propranolol (INDERAL) 20 MG tablet Take 1 tablet (20 mg total) by mouth 2 (two) times daily. 10/23/16  Yes Penumalli, Vikram R, MD  ramelteon (ROZEREM) 8 MG tablet Take 8 mg by mouth at bedtime. occas as needed   Yes [provider]  rizatriptan (MAXALT-MLT) 10 MG disintegrating tablet Take 1 tablet (10 mg total) by mouth as needed for migraine. May repeat in 2 hours if needed 10/23/16  Yes Penumalli, [name], MD  sucralfate (CARAFATE) 1 g tablet Take 1 tablet (1 g total) by mouth 4 (four) times daily -  with meals and at bedtime. 01/03/17  Yes Sherren Mocha, MD  topiramate (TOPAMAX) 100 MG tablet Take 1 tablet (100 mg total) by mouth 2 (two) times daily. 10/23/16  Yes Penumalli, [name], MD  valACYclovir (VALTREX) 500 MG tablet Take 1 tablet (500 mg total) by mouth 2 (two) times daily. To prevent recurrence of viral meningitis 12/21/16  Yes Sherren Mocha, MD   No Known Allergies  Review of Systems  Constitutional: Negative for chills, fatigue, fever and unexpected weight change.  Respiratory: Negative for cough.   Gastrointestinal: Positive for abdominal pain. Negative for constipation, diarrhea,  nausea and vomiting.  Genitourinary: Negative for dysuria, hematuria, vaginal bleeding and vaginal discharge.  Musculoskeletal: Positive for back pain.  Skin: Negative for rash and wound.  Neurological: Positive for headaches. Negative for dizziness and weakness.       Objective:   Physical Exam  Constitutional: She is oriented to person, place, and time. She appears well-developed and well-nourished. No distress.  HENT:  Head: Normocephalic and atraumatic.  Eyes: Pupils are equal, round, and reactive to light. EOM are normal.  Neck: Neck supple.  Cardiovascular: Normal rate.   Pulmonary/Chest: Effort normal. No respiratory distress.  Abdominal: Bowel sounds are increased. There is tenderness (generalized tenderness to palpation) in the right upper quadrant and epigastric area. There is positive Murphy's sign.  Positive referred pain in epigastrium in RUQ; bowel sounds were tympanitic  Musculoskeletal: Normal range of motion.  Neurological: She is alert and oriented to person, place, and time.  Skin: Skin is warm and dry.  Psychiatric: She has a normal mood and affect. Her behavior is normal.  Nursing note and vitals reviewed.   BP 110/82   Results for orders placed or performed in visit on 01/06/17  POCT CBC  Result Value Ref Range   WBC 6.7 4.6 - 10.2 K/uL   Lymph, poc 3.7 (A) 0.6 - 3.4   POC LYMPH PERCENT 55.4 (A) 10 - 50 %L   MID (cbc) 0.5 0 - 0.9   POC MID % 8.2 0 - 12 %M   POC Granulocyte 2.4 2 - 6.9   Granulocyte percent 36.4 (A) 37 - 80 %G   RBC 3.92 (A) 4.04 - 5.48 M/uL   Hemoglobin 11.2 (A) 12.2 - 16.2 g/dL   HCT, POC 11.9 (A) 14.7 - 47.9 %   MCV 87.2 80 - 97 fL   MCH, POC 28.6 27 - 31.2 pg   MCHC 32.8 31.8 - 35.4 g/dL   RDW, POC 82.9 %   Platelet Count, POC 226 142 - 424 K/uL   MPV 8.5 0 - 99.8 fL  POCT urinalysis dipstick  Result Value Ref Range   Color, UA yellow yellow   Clarity, UA cloudy (A) clear   Glucose, UA negative negative mg/dL   Bilirubin,  UA negative negative   Ketones, POC UA negative negative mg/dL   Spec Grav, UA 5.621 3.086 - 1.025   Blood, UA negative negative   pH, UA 7.0 5.0 - 8.0   Protein Ur, POC negative negative mg/dL   Urobilinogen, UA 1.0 0.2 or 1.0 E.U./dL   Nitrite, UA Negative Negative   Leukocytes, UA Negative Negative  POCT Microscopic Urinalysis ([facility])  Result Value Ref Range   WBC,UR,HPF,POC None None WBC/hpf   RBC,UR,HPF,POC None None RBC/hpf   Bacteria Few (A) None, Too numerous to count  Mucus Present (A) Absent   Epithelial Cells, UR Per Microscopy Few (A) None, Too numerous to count cells/hpf       Assessment & Plan:   1. Epigastric pain   2. Right upper quadrant abdominal pain   DDx: Rt kidney stone but no blood on UA vs gallstones but not worse with food.  Doubt PUD as no sig improvement with bid ppi and qid sulcrafate x 3d.  No GI sxs other than nausea but severity of abd pain on exam is quite concerning and now has been 1 wk - need stat CT today.  Orders Placed This Encounter  Procedures  . CT Abdomen Pelvis Wo Contrast    Standing Status:   Future    Number of Occurrences:   1    Standing Expiration Date:   04/08/2018    Order Specific Question:   Is patient pregnant?    Answer:   No    Order Specific Question:   Preferred imaging location?    Answer:   Furniture conservator/restorer Specific Question:   Call Results- Best Contact Number?    AnswerClelia Croft cell [phone]    Order Specific Question:   Radiology Contrast Protocol - do NOT remove file path    Answer:   [email or web address]  . POCT CBC  . POCT urinalysis dipstick  . POCT Microscopic Urinalysis ([facility])    Meds ordered this encounter  Medications  . HYDROcodone-acetaminophen (NORCO/VICODIN) 5-325 MG tablet    Sig: Take 1-2 tablets by mouth every 4 (four) hours as needed for moderate pain.    Dispense:  20 tablet    Refill:  0    I personally performed the services described in this  documentation, which was scribed in my presence. The recorded information has been reviewed and considered, and addended by me as needed.   [name], M.D.  Primary Care at [facility] [address] [place], [place] [address] [phone] phone [phone] fax  01/06/17 6:02 PM

## 2017-01-06 NOTE — Patient Instructions (Signed)
     IF you received an x-ray today, you will receive an invoice from [facility]. Please contact [facility] at [phone] with questions or concerns regarding your invoice.   IF you received labwork today, you will receive an invoice from [facility]. Please contact [facility] at [phone] with questions or concerns regarding your invoice.   Our billing staff will not be able to assist you with questions regarding bills from these companies.  You will be contacted with the lab results as soon as they are available. The fastest way to get your results is to activate your My Chart account. Instructions are located on the last page of this paperwork. If you have not heard from us regarding the results in 2 weeks, please contact this office.     

## 2017-01-08 ENCOUNTER — Encounter: Payer: Self-pay | Admitting: Family Medicine

## 2017-01-09 ENCOUNTER — Other Ambulatory Visit (INDEPENDENT_AMBULATORY_CARE_PROVIDER_SITE_OTHER): Payer: 59

## 2017-01-09 ENCOUNTER — Ambulatory Visit (INDEPENDENT_AMBULATORY_CARE_PROVIDER_SITE_OTHER): Payer: 59 | Admitting: Gastroenterology

## 2017-01-09 ENCOUNTER — Ambulatory Visit (HOSPITAL_COMMUNITY)
Admission: RE | Admit: 2017-01-09 | Discharge: 2017-01-09 | Disposition: A | Discharge status: Home / Self Care | Payer: 59 | Source: Ambulatory Visit | Attending: Gastroenterology | Admitting: Gastroenterology

## 2017-01-09 ENCOUNTER — Encounter: Payer: Self-pay | Admitting: Gastroenterology

## 2017-01-09 VITALS — BP 110/84 | HR 64 | Ht 63.0 in | Wt 203.0 lb

## 2017-01-09 DIAGNOSIS — R1011 Right upper quadrant pain: Secondary | ICD-10-CM | POA: Insufficient documentation

## 2017-01-09 LAB — CBC WITH DIFFERENTIAL/PLATELET
BASOS PCT: 0.7 % (ref 0.0–3.0)
Basophils Absolute: 0 10*3/uL (ref 0.0–0.1)
EOS PCT: 0.8 % (ref 0.0–5.0)
Eosinophils Absolute: 0 10*3/uL (ref 0.0–0.7)
HCT: 35.8 % — ABNORMAL LOW (ref 36.0–46.0)
Hemoglobin: 11.5 g/dL — ABNORMAL LOW (ref 12.0–15.0)
LYMPHS ABS: 2.5 10*3/uL (ref 0.7–4.0)
Lymphocytes Relative: 41.3 % (ref 12.0–46.0)
MCHC: 32.2 g/dL (ref 30.0–36.0)
MCV: 89.2 fl (ref 78.0–100.0)
MONOS PCT: 7.9 % (ref 3.0–12.0)
Monocytes Absolute: 0.5 10*3/uL (ref 0.1–1.0)
NEUTROS ABS: 3 10*3/uL (ref 1.4–7.7)
Neutrophils Relative %: 49.3 % (ref 43.0–77.0)
Platelets: 322 10*3/uL (ref 150.0–400.0)
RBC: 4.01 Mil/uL (ref 3.87–5.11)
RDW: 13.8 % (ref 11.5–15.5)
WBC: 6 10*3/uL (ref 4.0–10.5)

## 2017-01-09 LAB — COMPREHENSIVE METABOLIC PANEL
ALT: 22 U/L (ref 0–35)
AST: 16 U/L (ref 0–37)
Albumin: 4.2 g/dL (ref 3.5–5.2)
Alkaline Phosphatase: 41 U/L (ref 39–117)
BUN: 13 mg/dL (ref 6–23)
CALCIUM: 9 mg/dL (ref 8.4–10.5)
CHLORIDE: 107 meq/L (ref 96–112)
CO2: 25 mEq/L (ref 19–32)
Creatinine, Ser: 0.75 mg/dL (ref 0.40–1.20)
GFR: 108.39 mL/min (ref >60.00)
Glucose, Bld: 100 mg/dL — ABNORMAL HIGH (ref 70–99)
POTASSIUM: 4 meq/L (ref 3.5–5.1)
SODIUM: 137 meq/L (ref 135–145)
Total Bilirubin: 0.8 mg/dL (ref 0.2–1.2)
Total Protein: 7 g/dL (ref 6.0–8.3)

## 2017-01-09 NOTE — Patient Instructions (Addendum)
RUQ [place] for abdominal pain, check for gallstones.  You have been scheduled for an abdominal ultrasound at [facility] (1st floor of hospital) on Today at 1 pm. Please arrive 15 minutes prior to your appointment for registration. Make certain not to have anything to eat or drink 6 hours prior to your appointment. Should you need to reschedule your appointment, please contact radiology at [phone]. This test typically takes about 30 minutes to perform.  You will have labs checked today in the basement lab.  Please head down after you check out with the front desk  ( cbc, cmet).  Stay on your antiacid medicines for now.  Normal BMI (Body Mass Index- based on height and weight) is between 19 and 25. Your BMI today is Body mass index is 35.96 kg/m. Marland Kitchen Please consider follow up  regarding your BMI with your Primary Care Provider.

## 2017-01-09 NOTE — Progress Notes (Signed)
HPI: This is a very pleasant 41 year old woman  who was referred to me by Sherren Mocha, MD  to evaluate  abdominal pain, back pain .    Chief complaint is epigastric abdominal pain, low back pain  For about one week she has been having epigastric abdominal pain that is intermittent, stabbing, sometimes associated with nausea. Pains are brief lasting 5-10 minutes at most. Eating does not tend to bring it on. She feels that the pain may be radiating to her low back or at least she is also having a lot of low back pain around the same time that is more constant lately. She has probably lost 5 pounds in the past month or so  She does not drink alcohol. She does not take NSAIDs very routinely.   She is clearly uncomfortable in the office today   Old Data Reviewed: CT scan without IV contrast 10/18: No acute abnormality. No urolithiasis. No hydronephrosis. No evidence of bowel obstruction or acute bowel inflammation. Normal appendix. Labs 10/18:  cmet normal, h pylori breath test negative, cbc, Hb 11.2, u preg negative   Review of systems: Pertinent positive and negative review of systems were noted in the above HPI section. All other review negative.   Past Medical History:  Diagnosis Date  . Allergy   . Anemia   . Anxiety   . Arthritis   . Chronic headache disorder    s/p Meningitis x 2, tension- type plus allergic/sinus problems  . History of abnormal Pap smear   . Meningitis, viral At age 68 and at age 6  . Nasal fracture 2011  . Other and unspecified ovarian cysts 2011- pelvic CT   GYN- Dr. Chevis Pretty  . Vision abnormalities     Past Surgical History:  Procedure Laterality Date  . BACK SURGERY  02/09/15   L5 - S1 diskectomy  . FOOT SURGERY  s/p 2 procedures  . LAPAROSCOPY FOR ECTOPIC PREGNANCY  1998   Fallopian tube removed  . MOUTH SURGERY    . spinal injection  03/10/15  . SPINE SURGERY    . UTERINE ARTERY EMBOLIZATION      Current Outpatient Prescriptions  Medication  Sig Dispense Refill  . azelastine (ASTELIN) 0.1 % nasal spray Place 2 sprays into both nostrils 2 (two) times daily. In addition to the rhinocort 30 mL 12  . budesonide (RHINOCORT AQUA) 32 MCG/ACT nasal spray Place 2 sprays into both nostrils daily. 1 Bottle 12  . etonogestrel (NEXPLANON) 68 MG IMPL implant Inject 1 each into the skin once.    Marland Kitchen HYDROcodone-acetaminophen (NORCO/VICODIN) 5-325 MG tablet Take 1-2 tablets by mouth every 4 (four) hours as needed for moderate pain. 20 tablet 0  . meloxicam (MOBIC) 15 MG tablet Take 1 tablet (15 mg total) by mouth daily. As needed for pain. Do not use with any other otc pain medication. 90 tablet 1  . omeprazole (PRILOSEC) 40 MG capsule Take 1 capsule (40 mg total) by mouth 2 (two) times daily before a meal. By 30 minutes 60 capsule 0  . ondansetron (ZOFRAN ODT) 8 MG disintegrating tablet Take 1 tablet (8 mg total) by mouth every 8 (eight) hours as needed for nausea or vomiting. 20 tablet 0  . propranolol (INDERAL) 20 MG tablet Take 1 tablet (20 mg total) by mouth 2 (two) times daily. 60 tablet 6  . ramelteon (ROZEREM) 8 MG tablet Take 8 mg by mouth at bedtime. occas as needed    . rizatriptan (MAXALT-MLT) 10  MG disintegrating tablet Take 1 tablet (10 mg total) by mouth as needed for migraine. May repeat in 2 hours if needed 9 tablet 11  . sucralfate (CARAFATE) 1 g tablet Take 1 tablet (1 g total) by mouth 4 (four) times daily -  with meals and at bedtime. 60 tablet 1  . topiramate (TOPAMAX) 100 MG tablet Take 1 tablet (100 mg total) by mouth 2 (two) times daily. 180 tablet 4  . valACYclovir (VALTREX) 500 MG tablet Take 1 tablet (500 mg total) by mouth 2 (two) times daily. To prevent recurrence of viral meningitis 60 tablet 2   No current facility-administered medications for this visit.     Allergies as of 01/09/2017  . (No Known Allergies)    Family History  Problem Relation Age of Onset  . Migraines Father        cluster headache  .  Hyperlipidemia Father   . Hypertension Father   . Cancer Mother 75       Breast  . Stroke Maternal Grandmother   . Diabetes Paternal Grandmother   . Diabetes Unknown        paternal relaitve  . Seizures Unknown        maternal relative  . Rheum arthritis Unknown        maternal relatives    Social History   Social History  . Marital status: Married    Spouse name: Jesusita Oka  . Number of children: N/A  . Years of education: N/A   Occupational History  . GIS [employer]   Social History Main Topics  . Smoking status: Former Games developer  . Smokeless tobacco: Never Used  . Alcohol use 0.0 oz/week  . Drug use: No  . Sexual activity: Not on file   Other Topics Concern  . Not on file   Social History Narrative   Drinks about 12-16oz of coffee a day, occasionally drinks a tea.   Woks on computer as Systems developer.      Physical Exam: Ht  (1.6 m)   Wt 203 lb (92.1 kg)   BMI 35.96 kg/m  Constitutional: generally well-appearing Psychiatric: alert and oriented x3 Eyes: extraocular movements intact Mouth: oral pharynx moist, no lesions Neck: supple no lymphadenopathy Cardiovascular: heart regular rate and rhythm Lungs: clear to auscultation bilaterally Abdomen: soft, nontender, nondistended, no obvious ascites, no peritoneal signs, normal bowel sounds Extremities: no lower extremity edema bilaterally Skin: no lesions on visible extremities   Assessment and plan: 41 y.o. female with  Epigastric abdominal pain and back pain  I think most likely this is biliary in etiology. CAT scan without IV contrast didn't show any clear explanation. I explained to her that ultrasound is a much more sensitive test and CT scan and looking for gallbladder, gallstone problems and we will see if we can get an ultrasound done for her today. She will also repeat set of labs including a CBC, complete metabolic profile. She was started on antiacid medicines and her primary care office I  recommended she continue those for now since she is admittedly a bit better but certainly not normal at this point. If the ultrasound is negative then I think we need further imaging and I'll probably order a CT scan with IV contrast with particular attention of the pancreas which is known to cause radiating pains to the back not too uncommonly.    Please see the "Patient Instructions" section for addition details about the plan.   Rob Bunting, MD  [facility] 01/09/2017, 11:15 AM  Cc: Sherren Mocha, MD

## 2017-01-15 ENCOUNTER — Other Ambulatory Visit: Payer: Self-pay

## 2017-01-15 DIAGNOSIS — R112 Nausea with vomiting, unspecified: Secondary | ICD-10-CM

## 2017-01-15 DIAGNOSIS — R1011 Right upper quadrant pain: Secondary | ICD-10-CM

## 2017-01-15 NOTE — Progress Notes (Signed)
The pt has been advised and will call with any concerns, she will pick up contrast at the office today.       You have been scheduled for a CT scan of the abdomen and pelvis at [facility] ([address]---this is in the same building as Press [facility]).   You are scheduled on 01/16/17 at 330 pm. You should arrive 15 minutes prior to your appointment time for registration. Please follow the written instructions below on the day of your exam:  WARNING: IF YOU ARE ALLERGIC TO IODINE/X-RAY DYE, PLEASE NOTIFY RADIOLOGY IMMEDIATELY AT [phone]! YOU WILL BE GIVEN A 13 HOUR PREMEDICATION PREP.  1) Do not eat or drink anything after 1130 am (4 hours prior to your test) 2) You have been given 2 bottles of oral contrast to drink. The solution may taste better if refrigerated, but do NOT add ice or any other liquid to this solution. Shake well before drinking.    Drink 1 bottle of contrast @ 130 pm (2 hours prior to your exam)  Drink 1 bottle of contrast @ 230 pm (1 hour prior to your exam)  You may take any medications as prescribed with a small amount of water except for the following: Metformin, Glucophage, Glucovance, Avandamet, Riomet, Fortamet, Actoplus Met, Janumet, Glumetza or Metaglip. The above medications must be held the day of the exam AND 48 hours after the exam.  The purpose of you drinking the oral contrast is to aid in the visualization of your intestinal tract. The contrast solution may cause some diarrhea. Before your exam is started, you will be given a small amount of fluid to drink. Depending on your individual set of symptoms, you may also receive an intravenous injection of x-ray contrast/dye. Plan on being at [facility] for 30 minutes or longer, depending on the type of exam you are having performed.  This test typically takes 30-45 minutes to complete.  If you have any questions regarding your exam or if you need to reschedule, you may call the CT  department at [phone] between the hours of 8:00 am and 5:00 pm, Monday-Friday.  ________________________________________________________________________

## 2017-01-16 ENCOUNTER — Ambulatory Visit
Admission: RE | Admit: 2017-01-16 | Discharge: 2017-01-16 | Disposition: A | Discharge status: Home / Self Care | Payer: 59 | Source: Ambulatory Visit | Attending: Gastroenterology | Admitting: Gastroenterology

## 2017-01-16 DIAGNOSIS — R112 Nausea with vomiting, unspecified: Secondary | ICD-10-CM

## 2017-01-16 DIAGNOSIS — R1011 Right upper quadrant pain: Secondary | ICD-10-CM

## 2017-01-16 MED ORDER — IOPAMIDOL (ISOVUE-300) INJECTION 61%: 100.0000 mL | Freq: Once | INTRAVENOUS | Status: DC | PRN

## 2017-02-04 ENCOUNTER — Other Ambulatory Visit: Payer: Self-pay | Admitting: Family Medicine

## 2017-02-20 ENCOUNTER — Other Ambulatory Visit: Payer: Self-pay | Admitting: Family Medicine

## 2017-03-05 ENCOUNTER — Encounter: Payer: Self-pay | Admitting: Allergy and Immunology

## 2017-03-05 ENCOUNTER — Ambulatory Visit: Payer: 59 | Admitting: Allergy and Immunology

## 2017-03-05 VITALS — BP 114/72 | HR 82 | Temp 98.1°F | Resp 17 | Ht 64.5 in | Wt 210.4 lb

## 2017-03-05 DIAGNOSIS — J321 Chronic frontal sinusitis: Secondary | ICD-10-CM | POA: Diagnosis not present

## 2017-03-05 DIAGNOSIS — L309 Dermatitis, unspecified: Secondary | ICD-10-CM

## 2017-03-05 DIAGNOSIS — J310 Chronic rhinitis: Secondary | ICD-10-CM

## 2017-03-05 MED ORDER — CARBINOXAMINE MALEATE 6 MG PO TABS: 1.0000 | ORAL_TABLET | ORAL | 5 refills | Status: DC

## 2017-03-05 MED ORDER — AZELASTINE-FLUTICASONE 137-50 MCG/ACT NA SUSP: 1.0000 | Freq: Two times a day (BID) | NASAL | 5 refills | Status: DC | PRN

## 2017-03-05 MED ORDER — CLOTRIMAZOLE 1 % EX CREA: TOPICAL_CREAM | CUTANEOUS | 0 refills | Status: DC

## 2017-03-05 NOTE — Progress Notes (Signed)
New Patient Note  RE: Angela Farrell MRN: [id number] DOB: 06/12/1975 Date of Office Visit: 03/05/2017  Referring provider: Sherren MochaShaw, Angela N, MD Primary care provider: Sherren MochaShaw, Angela N, MD  Chief Complaint: Allergic Rhinitis ; Sinus Problem; and Rash   History of present illness: Angela Farrell is a 41 y.o. female seen today in consultation requested by Angela SorensonEva Shaw, MD.  She states that she experiences rhinorrhea "all the time", as well as nasal congestion, postnasal drainage, and sinus pressure over the forehead.  These symptoms occur year around but tend to be more frequent and severe during cold weather, with rapid weather changes, and with strong aromas such as perfumes and chemicals.  In the past she has tried multiple over-the-counter antihistamines without benefit and fluticasone nasal spray with limited benefit.  She is currently not taking any medications in an attempt to control these symptoms.  She also reports that she experiences watery eyes and irritated eyes.  She also complains of a flaky, nonpruritic rash which "comes and goes" between her breasts and beneath her breasts.  She states that she was seen by dermatologist a few years ago and a scraping was done, however she does not recall what the results of the scraping were.  She was given "a handful of samples", however was unable to determine which, if any, were beneficial.  She is unable to recall what those samples were.   Assessment and plan: Chronic rhinitis All seasonal and perennial aeroallergen skin tests are negative despite a positive histamine control.  Intranasal steroids, intranasal antihistamines, and first generation antihistamines are effective for symptoms associated with non-allergic rhinitis, whereas second generation antihistamines such as cetirizine (Zyrtec), loratadine (Claritin) and fexofenadine (Allegra) have been found to be ineffective for this condition.  A prescription has been provided for RyVent (carbinoxamine  maleate) 6mg  every 6-8 hours as needed.  A prescription has been provided for Dymista nasal spray, 1 spray per nostril 2 times daily as needed. Proper nasal spray technique has been discussed and demonstrated.   Nasal saline spray (i.e., Simply Saline) or nasal saline lavage (i.e., NeilMed) is recommended as needed and prior to medicated nasal sprays.  Chronic frontal sinusitis  Treatment plan as outlined above for chronic rhinitis.  If this problem persists or progresses, will refer to otolaryngology for further evaluation  Dermatitis Fungal skin infection versus atopic dermatitis.  The former is favored based on the fact that the rash is essentially nonpruritic.  A prescription has been provided for clotrimazole 1% cream sparingly to affected areas twice daily for 2-4 weeks.  If this problem persists or progresses, follow-up with dermatologist for further evaluation.   Meds ordered this encounter  Medications  . Carbinoxamine Maleate (RYVENT) 6 MG TABS    Sig: Take 1 tablet by mouth See admin instructions. Every 6-8 hours as needed    Dispense:  30 tablet    Refill:  5    BIN [id number]   RX PCN [id number]   Group [id number]   Cardholder ID    [id number]        CASH PAY ONLY  . Azelastine-Fluticasone 137-50 MCG/ACT SUSP    Sig: Place 1 spray into the nose 2 (two) times daily as needed.    Dispense:  23 g    Refill:  5  . clotrimazole (CLOTRIMAZOLE AF) 1 % cream    Sig: Apply sparingly to affected areas 2 times daily for 2-4 weeks.    Dispense:  60 g  Refill:  0    Diagnostics: Epicutaneous testing: Negative despite a positive histamine control. Intradermal testing: Negative.   Physical examination: Blood pressure 114/72, pulse 82, temperature 98.1 F (36.7 C), resp. rate 17, height 5' 4.5" (1.638 m), weight 210 lb 6.4 oz (95.4 kg), SpO2 98 %.  General: Alert, interactive, in no acute distress. HEENT: TMs pearly gray, turbinates moderately edematous with clear discharge,  post-pharynx mildly erythematous. Neck: Supple without lymphadenopathy. Lungs: Clear to auscultation without wheezing, rhonchi or rales. CV: Normal S1, S2 without murmurs. Abdomen: Nondistended, nontender. Skin: small hyperpigmented patches between the breasts. Extremities:  No clubbing, cyanosis or edema. Neuro:   Grossly intact.  Review of systems:  Review of systems negative except as noted in HPI / PMHx or noted below: Review of Systems  Constitutional: Negative.   HENT: Negative.   Eyes: Negative.   Respiratory: Negative.   Cardiovascular: Negative.   Gastrointestinal: Negative.   Genitourinary: Negative.   Musculoskeletal: Negative.   Skin: Negative.   Neurological: Negative.   Endo/Heme/Allergies: Negative.   Psychiatric/Behavioral: Negative.     Past medical history:  Past Medical History:  Diagnosis Date  . Allergy   . Anemia   . Anxiety   . Arthritis   . Chronic headache disorder    s/p Meningitis x 2, tension- type plus allergic/sinus problems  . History of abnormal Pap smear   . Meningitis, viral At age 41 and at age 41  . Nasal fracture 2011  . Other and unspecified ovarian cysts 2011- pelvic CT   GYN- Dr. Chevis PrettyMezer  . Vision abnormalities     Past surgical history:  Past Surgical History:  Procedure Laterality Date  . BACK SURGERY  02/09/15   L5 - S1 diskectomy  . FOOT SURGERY  s/p 2 procedures  . LAPAROSCOPY FOR ECTOPIC PREGNANCY  1998   Fallopian tube removed  . MOUTH SURGERY    . spinal injection  03/10/15  . SPINE SURGERY    . UTERINE ARTERY EMBOLIZATION      Family history: Family History  Problem Relation Age of Onset  . Migraines Father        cluster headache  . Hyperlipidemia Father   . Hypertension Father   . Cancer Mother [id number]       Breast  . Stroke Maternal Grandmother   . Diabetes Paternal Grandmother   . Diabetes Unknown        paternal relaitve  . Seizures Unknown        maternal relative  . Rheum arthritis Unknown         maternal relatives    Social history: Social History   Socioeconomic History  . Marital status: Married    Spouse name: Jesusita OkaDan  . Number of children: Not on file  . Years of education: Not on file  . Highest education level: Not on file  Social Needs  . Financial resource strain: Not on file  . Food insecurity - worry: Not on file  . Food insecurity - inability: Not on file  . Transportation needs - medical: Not on file  . Transportation needs - non-medical: Not on file  Occupational History  . Occupation: Chief Financial OfficerGIS Analyst    Employer: [employer]  Tobacco Use  . Smoking status: Former Games developermoker  . Smokeless tobacco: Never Used  Substance and Sexual Activity  . Alcohol use: Yes    Alcohol/week: 0.0 oz  . Drug use: No  . Sexual activity: Not on file  Other Topics  Concern  . Not on file  Social History Narrative   Drinks about 12-16oz of coffee a day, occasionally drinks a tea.   Woks on computer as Systems developer.    Environmental History: The patient lives in an 43 year old house with carpeting the bedroom, gas heat, and central air.  There are 2 dogs and a cat in the house which have access to her bedroom.  There is no known mold/water damage in the home.  She is a current cigarette smoker, having started in 1993.  She smokes less than 1 pack of cigarettes per day on average.  Allergies as of 03/05/2017   No Known Allergies     Medication List        Accurate as of 03/05/17  6:17 PM. Always use your most recent med list.          Azelastine-Fluticasone 137-50 MCG/ACT Susp Place 1 spray into the nose 2 (two) times daily as needed.   CALCIUM 1200+D3 PO Take 1,200 Units by mouth.   Carbinoxamine Maleate 6 MG Tabs Commonly known as:  RYVENT Take 1 tablet by mouth See admin instructions. Every 6-8 hours as needed   clotrimazole 1 % cream Commonly known as:  CLOTRIMAZOLE AF Apply sparingly to affected areas 2 times daily for 2-4 weeks.   CO Q 10 PO Take 50 mg by mouth  daily.   D3-1000 PO Take 2,000 Units by mouth.   GLUCOSAMINE-CHONDROITIN PO Take 1 tablet by mouth daily.   Lutein 40 MG Caps Take 40 mg by mouth daily.   Magnesium 500 MG Caps Take 500 mg by mouth 4 (four) times daily.   Potassium Gluconate 595 MG Caps Take 595 mg by mouth daily.   propranolol 20 MG tablet Commonly known as:  INDERAL Take 1 tablet (20 mg total) by mouth 2 (two) times daily.   rizatriptan 10 MG disintegrating tablet Commonly known as:  MAXALT-MLT Take 1 tablet (10 mg total) by mouth as needed for migraine. May repeat in 2 hours if needed   SUPER B COMPLEX/VITAMIN C PO Take by mouth.   topiramate 100 MG tablet Commonly known as:  TOPAMAX Take 1 tablet (100 mg total) by mouth 2 (two) times daily.   valACYclovir 500 MG tablet Commonly known as:  VALTREX Take 1 tablet (500 mg total) by mouth 2 (two) times daily. To prevent recurrence of viral meningitis   vitamin A 8000 UNIT capsule Take 8,000 Units by mouth daily.   vitamin C 500 MG tablet Commonly known as:  ASCORBIC ACID Take 500 mg by mouth daily.       Known medication allergies: No Known Allergies  I appreciate the opportunity to take part in [place] care. Please do not hesitate to contact me with questions.  Sincerely,   R. Jorene Guest, MD

## 2017-03-05 NOTE — Assessment & Plan Note (Addendum)
All seasonal and perennial aeroallergen skin tests are negative despite a positive histamine control.  Intranasal steroids, intranasal antihistamines, and first generation antihistamines are effective for symptoms associated with non-allergic rhinitis, whereas second generation antihistamines such as cetirizine (Zyrtec), loratadine (Claritin) and fexofenadine (Allegra) have been found to be ineffective for this condition.  A prescription has been provided for RyVent (carbinoxamine maleate) 6mg  every 6-8 hours as needed.  A prescription has been provided for Dymista nasal spray, 1 spray per nostril 2 times daily as needed. Proper nasal spray technique has been discussed and demonstrated.   Nasal saline spray (i.e., Simply Saline) or nasal saline lavage (i.e., NeilMed) is recommended as needed and prior to medicated nasal sprays.

## 2017-03-05 NOTE — Assessment & Plan Note (Signed)
Fungal skin infection versus atopic dermatitis.  The former is favored based on the fact that the rash is essentially nonpruritic.  A prescription has been provided for clotrimazole 1% cream sparingly to affected areas twice daily for 2-4 weeks.  If this problem persists or progresses, follow-up with dermatologist for further evaluation.

## 2017-03-05 NOTE — Patient Instructions (Addendum)
Chronic rhinitis All seasonal and perennial aeroallergen skin tests are negative despite a positive histamine control.  Intranasal steroids, intranasal antihistamines, and first generation antihistamines are effective for symptoms associated with non-allergic rhinitis, whereas second generation antihistamines such as cetirizine (Zyrtec), loratadine (Claritin) and fexofenadine (Allegra) have been found to be ineffective for this condition.  A prescription has been provided for RyVent (carbinoxamine maleate) 6mg  every 6-8 hours as needed.  A prescription has been provided for Dymista nasal spray, 1 spray per nostril 2 times daily as needed. Proper nasal spray technique has been discussed and demonstrated.   Nasal saline spray (i.e., Simply Saline) or nasal saline lavage (i.e., NeilMed) is recommended as needed and prior to medicated nasal sprays.  Chronic frontal sinusitis  Treatment plan as outlined above for chronic rhinitis.  If this problem persists or progresses, will refer to otolaryngology for further evaluation  Dermatitis Fungal skin infection versus atopic dermatitis.  The former is favored based on the fact that the rash is essentially nonpruritic.  A prescription has been provided for clotrimazole 1% cream sparingly to affected areas twice daily for 2-4 weeks.  If this problem persists or progresses, follow-up with dermatologist for further evaluation.   Return in about 4 months (around 07/04/2017), or if symptoms worsen or fail to improve.

## 2017-03-05 NOTE — Assessment & Plan Note (Deleted)
Fungal skin infection versus atopic dermatitis.  The former is favored based on the fact that the rash is essentially nonpruritic.  A prescription has been provided for clotrimazole 1% cream sparingly to affected areas twice daily for 2-4 weeks.  If this problem persists or progresses, follow-up with dermatologist for further evaluation. 

## 2017-03-05 NOTE — Assessment & Plan Note (Signed)
   Treatment plan as outlined above for chronic rhinitis.  If this problem persists or progresses, will refer to otolaryngology for further evaluation

## 2017-03-07 ENCOUNTER — Telehealth: Payer: Self-pay | Admitting: Family Medicine

## 2017-03-07 ENCOUNTER — Telehealth: Payer: Self-pay

## 2017-03-07 NOTE — Telephone Encounter (Signed)
I submitted a prior authorization for the Dymista. It is not covered by her insurance. Do you want me to split the medications?

## 2017-03-07 NOTE — Telephone Encounter (Signed)
Yes, thanks

## 2017-03-07 NOTE — Telephone Encounter (Signed)
Pt dropped off form to be completed by Dr Clelia CroftShaw  Pt would like form to be faxed to the highlighted number on form

## 2017-03-08 MED ORDER — AZELASTINE HCL 0.15 % NA SOLN: 2.0000 | Freq: Two times a day (BID) | NASAL | 5 refills | Status: DC

## 2017-03-08 MED ORDER — FLUTICASONE PROPIONATE 50 MCG/ACT NA SUSP: 2.0000 | Freq: Every day | NASAL | 5 refills | Status: DC

## 2017-03-08 MED ORDER — AZELASTINE HCL 0.1 % NA SOLN: 2.0000 | Freq: Two times a day (BID) | NASAL | 5 refills | Status: DC

## 2017-03-08 NOTE — Addendum Note (Signed)
Addended by: Mliss FritzBLACK, Ammi Hutt I on: 03/08/2017 10:24 AM   Modules accepted: Orders

## 2017-03-08 NOTE — Telephone Encounter (Signed)
I left a detailed message for the patient advising her that the medication has been sent in as separates.

## 2017-03-08 NOTE — Addendum Note (Signed)
Addended by: Mliss FritzBLACK, Isacc Turney I on: 03/08/2017 11:16 AM   Modules accepted: Orders

## 2017-03-18 ENCOUNTER — Other Ambulatory Visit: Payer: Self-pay | Admitting: Family Medicine

## 2017-03-19 ENCOUNTER — Other Ambulatory Visit: Payer: Self-pay | Admitting: *Deleted

## 2017-03-19 NOTE — Telephone Encounter (Signed)
Please advise on refill for omeprazole.

## 2017-04-20 ENCOUNTER — Other Ambulatory Visit: Payer: Self-pay | Admitting: Family Medicine

## 2017-04-20 NOTE — Telephone Encounter (Signed)
Refill request

## 2017-05-01 ENCOUNTER — Other Ambulatory Visit: Payer: Self-pay

## 2017-05-01 ENCOUNTER — Ambulatory Visit: Payer: 59 | Admitting: Diagnostic Neuroimaging

## 2017-05-01 ENCOUNTER — Encounter: Payer: Self-pay | Admitting: Physician Assistant

## 2017-05-01 ENCOUNTER — Ambulatory Visit: Payer: 59 | Admitting: Physician Assistant

## 2017-05-01 VITALS — BP 118/82 | HR 79 | Temp 99.1°F | Resp 16 | Ht 64.17 in | Wt 207.0 lb

## 2017-05-01 DIAGNOSIS — J101 Influenza due to other identified influenza virus with other respiratory manifestations: Secondary | ICD-10-CM

## 2017-05-01 DIAGNOSIS — R5383 Other fatigue: Secondary | ICD-10-CM | POA: Diagnosis not present

## 2017-05-01 DIAGNOSIS — J029 Acute pharyngitis, unspecified: Secondary | ICD-10-CM

## 2017-05-01 DIAGNOSIS — R05 Cough: Secondary | ICD-10-CM | POA: Diagnosis not present

## 2017-05-01 DIAGNOSIS — R059 Cough, unspecified: Secondary | ICD-10-CM

## 2017-05-01 LAB — POCT INFLUENZA A/B
Influenza A, POC: POSITIVE — AB
Influenza B, POC: POSITIVE — AB

## 2017-05-01 LAB — POCT RAPID STREP A (OFFICE): Rapid Strep A Screen: NEGATIVE

## 2017-05-01 MED ORDER — AMBULATORY NON FORMULARY MEDICATION: 1 refills | Status: DC

## 2017-05-01 MED ORDER — BENZONATATE 200 MG PO CAPS: 200.0000 mg | ORAL_CAPSULE | Freq: Two times a day (BID) | ORAL | 0 refills | Status: DC | PRN

## 2017-05-01 MED ORDER — HYDROCODONE-HOMATROPINE 5-1.5 MG/5ML PO SYRP: 5.0000 mL | ORAL_SOLUTION | Freq: Three times a day (TID) | ORAL | 0 refills | Status: DC | PRN

## 2017-05-01 NOTE — Patient Instructions (Addendum)
You are positive for the flu. You are contagious so please use proper precautions.  Buy Cepacol cough drops at your pharmacy. (coupon) Hycodan is for cough. This will make you drowsy. Only use this as directed.  Tessalon (benzonatate) is for cough. This will not make you drowsy.   Stay well hydrated. Get lost of rest. Wash your hands often.   -Foods that can help speed recovery: honey, garlic, chicken soup, elderberries, green tea.  -Supplements that can help speed recovery: vitamin C, zinc, elderberry extract, quercetin, ginseng, selenium -Supplement with prebiotics and probiotics:   Advil or ibuprofen for pain. Do not take Aspirin.  Drink enough water and fluids to keep your urine clear or pale yellow. For sore throat: ? Gargle with 8 oz of salt water ( tsp of salt per 1 qt of water) as often as every 1-2 hours to soothe your throat.  Gargle liquid Benadryl.  Come back if you are not improving in 5-7 days.   For sore throat try using a honey-based tea. Use 3 teaspoons of honey with juice squeezed from half lemon. Place shaved pieces of ginger into 1/2-1 cup of water and warm over stove top. Then mix the ingredients and repeat every 4 hours as needed.  Cough Syrup Recipe: Sweet Lemon & Honey Thyme  Ingredients a handful of fresh thyme sprigs   1 pint of water (2 cups)  1/2 cup honey (raw is best, but regular will do)  1/2 lemon chopped Instructions 1. Place the lemon in the pint jar and cover with the honey. The honey will macerate the lemons and draw out liquids which taste so delicious! 2. Meanwhile, toss the thyme leaves into a saucepan and cover them with the water. 3. Bring the water to a gentle simmer and reduce it to half, about a cup of tea. 4. When the tea is reduced and cooled a bit, strain the sprigs & leaves, add it into the pint jar and stir it well. 5. Give it a shake and use a spoonful as needed. 6. Store your homemade cough syrup in the refrigerator for about a  month.  What causes a cough? In adults, common causes of a cough include: ?An infection of the airways or lungs (such as the common cold) ?Postnasal drip - Postnasal drip is when mucus from the nose drips down or flows along the back of the throat. Postnasal drip can happen when people have: .A cold .Allergies .A sinus infection - The sinuses are hollow areas in the bones of the face that open into the nose. ?Lung conditions, like asthma and chronic obstructive pulmonary disease (COPD) - Both of these conditions can make it hard to breathe. COPD is usually caused by smoking. ?Acid reflux - Acid reflux is when the acid that is normally in your stomach backs up into your esophagus (the tube that carries food from your mouth to your stomach). ?A side effect from blood pressure medicines called "ACE inhibitors" ?Smoking cigarettes  Is there anything I can do on my own to get rid of my cough? Yes. To help get rid of your cough, you can: ?Use a humidifier in your bedroom ?Use an over-the-counter cough medicine, or suck on cough drops or hard candy ?Stop smoking, if you smoke ?If you have allergies, avoid the things you are allergic to (like pollen, dust, animals, or mold) If you have acid reflux, your doctor or nurse will tell you which lifestyle changes can help reduce symptoms.   Thank  you for coming in today. I hope you feel we met your needs.  Feel free to call PCP if you have any questions or further requests.  Please consider signing up for MyChart if you do not already have it, as this is a great way to communicate with me.  Best,  Whitney McVey, PA-C   IF you received an x-ray today, you will receive an invoice from [facility]. Please contact [facility] at [phone] with questions or concerns regarding your invoice.   IF you received labwork today, you will receive an invoice from [facility]. Please contact [facility] at [phone] with questions or concerns  regarding your invoice.   Our billing staff will not be able to assist you with questions regarding bills from these companies.  You will be contacted with the lab results as soon as they are available. The fastest way to get your results is to activate your My Chart account. Instructions are located on the last page of this paperwork. If you have not heard from [place] regarding the results in 2 weeks, please contact this office.

## 2017-05-01 NOTE — Progress Notes (Signed)
Angela Farrell  MRN: [id number] DOB: [birth year]  PCP: Sherren Mocha, MD  Subjective:  Pt is a 42 year old female PMH anemia, anxiety, depression who presents to clinic for cough and sore throat x 3 days. She endorses body aches, nasal drainage, and fever. Has taken otc cough medication.  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  ROS as below.   Review of Systems  Constitutional: Positive for chills, fatigue and fever. Negative for diaphoresis.  HENT: Positive for congestion, postnasal drip, rhinorrhea, sinus pressure and sore throat. Negative for sinus pain.   Respiratory: Positive for cough. Negative for chest tightness, shortness of breath and wheezing.     Patient Active Problem List   Diagnosis Date Noted  . Dermatitis 03/05/2017  . Chronic frontal sinusitis 03/05/2017  . History of meningitis 11/03/2014  . Bilateral hip pain 11/03/2014  . Depression 11/03/2014  . Anxiety disorder 01/18/2012  . Chronic anemia 10/14/2011  . Viral meningitis 09/26/2011  . Chronic headache disorder 04/18/2011  . Obesity 04/18/2011  . Chronic rhinitis 04/18/2011    Class: Chronic    Current Outpatient Medications on File Prior to Visit  Medication Sig Dispense Refill  . B Complex-C (SUPER B COMPLEX/VITAMIN C PO) Take by mouth.    . Calcium-Magnesium-Vitamin D (CALCIUM 1200+D3 PO) Take 1,200 Units by mouth.    . Cholecalciferol (D3-1000 PO) Take 2,000 Units by mouth.    . clotrimazole (CLOTRIMAZOLE AF) 1 % cream Apply sparingly to affected areas 2 times daily for 2-4 weeks. 60 g 0  . Coenzyme Q10 (CO Q 10 PO) Take 50 mg by mouth daily.    Marland Kitchen GLUCOSAMINE-CHONDROITIN PO Take 1 tablet by mouth daily.    . Lutein 40 MG CAPS Take 40 mg by mouth daily.    . Magnesium 500 MG CAPS Take 500 mg by mouth 4 (four) times daily.    . meloxicam (MOBIC) 15 MG tablet Take 15 mg by mouth daily.    Marland Kitchen omeprazole (PRILOSEC) 40 MG capsule TAKE 1 CAPSULE BY MOUTH TWICE A DAY BEFORE MEALS BY 30 MINTUES 60  capsule 0  . Potassium Gluconate 595 MG CAPS Take 595 mg by mouth daily.    . propranolol (INDERAL) 20 MG tablet Take 1 tablet (20 mg total) by mouth 2 (two) times daily. 60 tablet 6  . rizatriptan (MAXALT-MLT) 10 MG disintegrating tablet Take 1 tablet (10 mg total) by mouth as needed for migraine. May repeat in 2 hours if needed 9 tablet 11  . topiramate (TOPAMAX) 100 MG tablet Take 1 tablet (100 mg total) by mouth 2 (two) times daily. 180 tablet 4  . valACYclovir (VALTREX) 500 MG tablet TAKE 1 TABLET BY MOUTH TWICE A DAY 60 tablet 0  . vitamin A 8000 UNIT capsule Take 8,000 Units by mouth daily.    . vitamin C (ASCORBIC ACID) 500 MG tablet Take 500 mg by mouth daily.     No current facility-administered medications on file prior to visit.     No Known Allergies   Objective:  BP 118/82   Pulse 79   Temp 99.1 F (37.3 C) (Oral)   Resp 16   Ht 5' 4.17" (1.63 m)   Wt 207 lb (93.9 kg)   SpO2 99%   BMI 35.34 kg/m   Physical Exam  Constitutional: She is oriented to person, place, and time and well-developed, well-nourished, and in no distress. No distress.  HENT:  Right Ear: Tympanic membrane normal.  Left  Ear: Tympanic membrane normal.  Nose: Mucosal edema present. No rhinorrhea. Right sinus exhibits no maxillary sinus tenderness and no frontal sinus tenderness. Left sinus exhibits no maxillary sinus tenderness and no frontal sinus tenderness.  Mouth/Throat: Oropharynx is clear and moist and mucous membranes are normal.  Cardiovascular: Normal rate, regular rhythm and normal heart sounds.  Pulmonary/Chest: Effort normal and breath sounds normal. No respiratory distress. She has no wheezes. She has no rales.  Neurological: She is alert and oriented to person, place, and time. GCS score is 15.  Skin: Skin is warm and dry.  Psychiatric: Mood, memory, affect and judgment normal.  Vitals reviewed.   Results for orders placed or performed in visit on 05/01/17  POCT Influenza A/B    Result Value Ref Range   Influenza A, POC Positive (A) Negative   Influenza B, POC Positive (A) Negative  POCT rapid strep A  Result Value Ref Range   Rapid Strep A Screen Negative Negative    Assessment and Plan :  1. Influenza A 2. Influenza B - Pt presents with 3 days of symptoms with +influenza A and B. Will not treat as she is >48 hours from symptom onset. Advised supportive care and stay home from work. RTC if no improvement or worsening symptoms in 5-7 days.   3. Sore throat - POCT rapid strep A - AMBULATORY NON FORMULARY MEDICATION; MIX: 90 ml Cherry Mylanta, 90 ml Cherry Benadryl, 30 ml Cherry Hurricane liquid. 5-10 ml PO swish and spit or swallow, Q2 hours, prn sore throat.  Dispense: 210 mL; Refill: 1  4. Other fatigue - POCT Influenza A/B  5. Cough - HYDROcodone-homatropine (HYCODAN) 5-1.5 MG/5ML syrup; Take 5 mLs by mouth every 8 (eight) hours as needed for cough.  Dispense: 120 mL; Refill: 0 - benzonatate (TESSALON) 200 MG capsule; Take 1 capsule (200 mg total) by mouth 2 (two) times daily as needed for cough.  Dispense: 20 capsule; Refill: 0    Marco CollieWhitney Dawn Convery, PA-C  [facility] 05/01/2017 11:05 AM

## 2017-05-17 DIAGNOSIS — Z808 Family history of malignant neoplasm of other organs or systems: Secondary | ICD-10-CM | POA: Diagnosis not present

## 2017-05-17 DIAGNOSIS — Z803 Family history of malignant neoplasm of breast: Secondary | ICD-10-CM | POA: Diagnosis not present

## 2017-05-17 DIAGNOSIS — Z01419 Encounter for gynecological examination (general) (routine) without abnormal findings: Secondary | ICD-10-CM | POA: Diagnosis not present

## 2017-05-18 ENCOUNTER — Other Ambulatory Visit: Payer: Self-pay | Admitting: Diagnostic Neuroimaging

## 2017-05-18 NOTE — Telephone Encounter (Signed)
Notified pharmacy patient needs to schedule FU for this summer.

## 2017-05-26 ENCOUNTER — Other Ambulatory Visit: Payer: Self-pay | Admitting: Family Medicine

## 2017-06-19 ENCOUNTER — Other Ambulatory Visit: Payer: Self-pay | Admitting: Family Medicine

## 2017-06-19 NOTE — Telephone Encounter (Signed)
Meloxicam refill Last OV: 12/21/16 Last Refill:ordered 12/21/16 and then dc'd 03/05/17 [address]:[facility] [address] PCP: Norberto SorensonEva Shaw MD

## 2017-06-19 NOTE — Telephone Encounter (Signed)
Dr Clelia CroftShaw please advise. [email or web address]

## 2017-06-24 ENCOUNTER — Encounter: Payer: Self-pay | Admitting: Family Medicine

## 2017-06-24 NOTE — Telephone Encounter (Signed)
Has used 6 mos rx in 6 mos so appears to be taking meloxicam daily which is over twice as much as when she was seen prior. Was also have sig epigastric pains w/u by GI who noted she was rarely taking nsaids since her last visit w/ me and not sure how this resolved. Also has chronic HAs and daily nsaid use can cause worsening rebound HAs - need OV to discuss med use/side effects/safety before further refills. Informed pt in MyChart email

## 2017-06-25 ENCOUNTER — Other Ambulatory Visit: Payer: Self-pay | Admitting: Family Medicine

## 2017-06-28 DIAGNOSIS — Z809 Family history of malignant neoplasm, unspecified: Secondary | ICD-10-CM | POA: Diagnosis not present

## 2017-07-03 ENCOUNTER — Other Ambulatory Visit: Payer: Self-pay | Admitting: Obstetrics and Gynecology

## 2017-07-03 DIAGNOSIS — Z803 Family history of malignant neoplasm of breast: Secondary | ICD-10-CM

## 2017-07-11 ENCOUNTER — Other Ambulatory Visit: Payer: Self-pay

## 2017-07-11 NOTE — Telephone Encounter (Signed)
[facility]ax req [facility]-pt needs appt per Dr. Clelia CroftShaw who responded to pt via MyChart- no appt made as of this date

## 2017-07-19 ENCOUNTER — Ambulatory Visit
Admission: RE | Admit: 2017-07-19 | Discharge: 2017-07-19 | Disposition: A | Discharge status: Home / Self Care | Payer: 59 | Source: Ambulatory Visit | Attending: Obstetrics and Gynecology | Admitting: Obstetrics and Gynecology

## 2017-07-19 DIAGNOSIS — Z803 Family history of malignant neoplasm of breast: Secondary | ICD-10-CM | POA: Diagnosis not present

## 2017-07-19 MED ORDER — GADOBENATE DIMEGLUMINE 529 MG/ML IV SOLN
19.0000 mL | Freq: Once | INTRAVENOUS | Status: AC | PRN
  Administered 2017-07-19: 19 mL via INTRAVENOUS

## 2017-07-23 ENCOUNTER — Other Ambulatory Visit: Payer: Self-pay | Admitting: Family Medicine

## 2017-08-20 ENCOUNTER — Other Ambulatory Visit: Payer: Self-pay | Admitting: Family Medicine

## 2017-10-26 ENCOUNTER — Other Ambulatory Visit: Payer: Self-pay | Admitting: Diagnostic Neuroimaging

## 2017-11-14 ENCOUNTER — Other Ambulatory Visit: Payer: Self-pay | Admitting: Diagnostic Neuroimaging

## 2017-11-14 NOTE — Telephone Encounter (Signed)
Refilled x 3 months with note to pharmacy re: have patient call and schedule FU. Last seen 10/2016.

## 2017-11-20 ENCOUNTER — Other Ambulatory Visit: Payer: Self-pay | Admitting: Diagnostic Neuroimaging

## 2017-11-21 ENCOUNTER — Telehealth: Payer: Self-pay

## 2017-11-21 NOTE — Telephone Encounter (Signed)
I contacted the patient and left a vm (ok per DPR) on her mobile # advising we had received a refill for the rizatriptan and topiramate. I advised the patient we would need make an appointment with our office before refilling these medications because he last office visit was 10/23/2016. Once appointment has been made we can refill pending her office appt.  MB RN.

## 2017-11-22 ENCOUNTER — Other Ambulatory Visit: Payer: Self-pay | Admitting: Diagnostic Neuroimaging

## 2017-11-22 NOTE — Telephone Encounter (Signed)
Refused refills with note to pharmacy re: have patient call and schedule follow up. Last seen 10/2016.

## 2017-11-29 ENCOUNTER — Other Ambulatory Visit: Payer: Self-pay | Admitting: Family Medicine

## 2017-11-29 NOTE — Telephone Encounter (Signed)
Refill of Valtrex  LRF 08/20/17  #60  0 refills  LOV 05/01/17 W. McVey   [id number]- [place], [place] - [address]  [phone] (Phone) [phone] (Fax)

## 2017-11-29 NOTE — Telephone Encounter (Signed)
Copied from CRM [id number]. Topic: Quick Communication - Rx Refill/Question >> Nov 29, 2017  9:48 AM Baldo DaubAlexander, Amber L wrote: Medication: valACYclovir (VALTREX) 500 MG tablet  Has the patient contacted their pharmacy? Yes - refills are out.  Pt states she is completely out of pills (Agent: If no, request that the patient contact the pharmacy for the refill.) (Agent: If yes, when and what did the pharmacy advise?)  Preferred Pharmacy (with phone number or street name): [id number] Otto- [place], [place] - [address] [phone] (Phone) [phone] (Fax)  Agent: Please be advised that RX refills may take up to 3 business days. We ask that you follow-up with your pharmacy.

## 2017-12-04 MED ORDER — VALACYCLOVIR HCL 500 MG PO TABS: 500.0000 mg | ORAL_TABLET | Freq: Two times a day (BID) | ORAL | 0 refills | Status: DC

## 2017-12-05 ENCOUNTER — Other Ambulatory Visit: Payer: Self-pay | Admitting: Family Medicine

## 2017-12-07 MED ORDER — VALACYCLOVIR HCL 500 MG PO TABS: 500.0000 mg | ORAL_TABLET | Freq: Two times a day (BID) | ORAL | 0 refills | Status: DC

## 2017-12-10 ENCOUNTER — Telehealth: Payer: Self-pay | Admitting: Diagnostic Neuroimaging

## 2017-12-10 ENCOUNTER — Encounter: Payer: Self-pay | Admitting: Diagnostic Neuroimaging

## 2017-12-10 ENCOUNTER — Ambulatory Visit: Payer: 59 | Admitting: Diagnostic Neuroimaging

## 2017-12-10 VITALS — BP 129/85 | HR 80 | Ht 64.0 in | Wt 212.4 lb

## 2017-12-10 DIAGNOSIS — G43009 Migraine without aura, not intractable, without status migrainosus: Secondary | ICD-10-CM | POA: Diagnosis not present

## 2017-12-10 MED ORDER — ERENUMAB-AOOE 70 MG/ML ~~LOC~~ SOAJ: 70.0000 mg | SUBCUTANEOUS | 4 refills | Status: DC

## 2017-12-10 MED ORDER — RIZATRIPTAN BENZOATE 10 MG PO TBDP: 10.0000 mg | ORAL_TABLET | ORAL | 11 refills | Status: DC | PRN

## 2017-12-10 MED ORDER — PROPRANOLOL HCL 40 MG PO TABS: 40.0000 mg | ORAL_TABLET | Freq: Two times a day (BID) | ORAL | 12 refills | Status: DC

## 2017-12-10 MED ORDER — TOPIRAMATE 100 MG PO TABS: 100.0000 mg | ORAL_TABLET | Freq: Two times a day (BID) | ORAL | 12 refills | Status: DC

## 2017-12-10 NOTE — Telephone Encounter (Signed)
Pt. States she will call back to schedule 1 yr f/u / np mm per Dr. Marjory Lies

## 2017-12-10 NOTE — Progress Notes (Signed)
[facility]  PATIENT: Angela Farrell DOB: Aug 15, 1975  REFERRING CLINICIAN: Copeland HISTORY FROM: patient REASON FOR VISIT: follow up   HISTORICAL  CHIEF COMPLAINT:  Chief Complaint  Patient presents with  . Follow-up    Gets migraines 2/month.    . Migraine     Last couple weeks noted increase.  Not usre what triggered them.  With insurance change will only give her 4 tablet rizatriptan per month.    HISTORY OF PRESENT ILLNESS:   UPDATE (12/10/17, VRP): Since last visit, doing about the same. Symptoms are moderate. Still with daily headaches + 2 severe migraines per month. No alleviating or aggravating factors. Tolerating meds.    UPDATE 10/23/16: Since last visit, was doing well until last few months. Now daily headaches, and severe migraine 1 per week. [place] 4th week, and having more migraines.   UPDATE 04/25/16: Since last visit, doing well. Only 2-3 migraine in the last 6-7 months. Much improved.  Last migraine was a few weeks ago. Has been doing better with yoga and exercise.  UPDATE 09/21/15: Since last visit, overall stable. Avg 1-2 migraine per month. Avg 3-4 HA per week. Currently with right forehead headache.   UPDATE 06/21/15: Since last visit, was doing well with higher TPX, but now more work stress, and more HA since March 2017.  UPDATE 03/23/15: Since last visit, was doing better with TPX + rizatriptan; then had back surgery Feb 09, 2015; then Dec 2016 having more HA.  PRIOR HPI (12/25/14): 42 year old ambidextrous female here for evaluation of headaches. 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 Patient has history of headaches starting in childhood around 42 years old. She describes frontal, eye pressure, throbbing severe headaches with neck pain, photophobia, phonophobia and occasionally nausea. Headaches can last hours to days at a time. Triggers include stress. Over  the years headaches varied in frequency and severity. More recently headaches have been occurring roughly 4-6 days per month. Patient has family history of cluster headache in her father. Patient herself has never been diagnosed with migraine headaches or tried migraine medications. Patient had been hospitalized several times for headache, light sensitivity, neck pain, fevers, with testing and lumbar puncture results demonstrating viral/aseptic meningitis. 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 On the basis of this patient was diagnosed with recurrent meningitis (Mollaret's) in 2013. Since that time she has been prescribed valacyclovir 500 mg twice a day to be taken during times of recurrence of symptoms. She uses this 6-8 months out of the year, 3-4 days at a time. This has helped keep some of her symptoms under control. Her last hospitalization was in 2013 for meningitis. More recently, last night patient had recurrence of headache. She had some nausea and vomiting last night. This morning and during office visit patient is suffering with severe headache, sensitivity to light, neck pain. Patient states that her current symptoms feel slightly similar to her prior meningitis attacks but not as severe. Patient has not started valacyclovir yet for this attack.   REVIEW OF SYSTEMS: Full 14 system review of systems performed and negative except for: anemia runny nose neck pain freq waking.   ALLERGIES: No Known Allergies  HOME MEDICATIONS: Outpatient Medications Prior to Visit  Medication Sig Dispense Refill  . B Complex-C (SUPER B COMPLEX/VITAMIN C PO) Take by mouth.    . benzonatate (TESSALON) 200 MG capsule Take 1 capsule (  200 mg total) by mouth 2 (two) times daily as needed for cough. 20 capsule 0  . Calcium-Magnesium-Vitamin D (CALCIUM 1200+D3 PO) Take 1,200 Units by mouth.    . Cholecalciferol (D3-1000 PO) Take 2,000 Units by mouth.    . clotrimazole (CLOTRIMAZOLE AF) 1 % cream Apply sparingly to  affected areas 2 times daily for 2-4 weeks. 60 g 0  . Coenzyme Q10 (CO Q 10 PO) Take 50 mg by mouth daily.    Marland Kitchen GLUCOSAMINE-CHONDROITIN PO Take 1 tablet by mouth daily.    Marland Kitchen HYDROcodone-homatropine (HYCODAN) 5-1.5 MG/5ML syrup Take 5 mLs by mouth every 8 (eight) hours as needed for cough. 120 mL 0  . Lutein 40 MG CAPS Take 40 mg by mouth daily.    . Magnesium 500 MG CAPS Take 500 mg by mouth 4 (four) times daily.    . meloxicam (MOBIC) 15 MG tablet Take 15 mg by mouth daily.    Marland Kitchen omeprazole (PRILOSEC) 40 MG capsule TAKE 1 CAPSULE BY MOUTH TWICE A DAY BEFORE MEALS BY 30 MINTUES 60 capsule 0  . Potassium Gluconate 595 MG CAPS Take 595 mg by mouth daily.    . propranolol (INDERAL) 20 MG tablet TAKE 1 TABLET BY MOUTH TWICE A DAY 60 tablet 2  . rizatriptan (MAXALT-MLT) 10 MG disintegrating tablet Take 1 tablet (10 mg total) by mouth as needed for migraine. May repeat in 2 hours if needed 9 tablet 11  . topiramate (TOPAMAX) 100 MG tablet TAKE 1 TABLET BY MOUTH TWICE A DAY 60 tablet 0  . valACYclovir (VALTREX) 500 MG tablet Take 1 tablet (500 mg total) by mouth 2 (two) times daily. 60 tablet 0  . vitamin A 8000 UNIT capsule Take 8,000 Units by mouth daily.    . vitamin C (ASCORBIC ACID) 500 MG tablet Take 500 mg by mouth daily.    . AMBULATORY NON FORMULARY MEDICATION MIX: 90 ml Cherry Mylanta, 90 ml Cherry Benadryl, 30 ml Cherry Hurricane liquid.  5-10 ml PO swish and spit or swallow, Q2 hours, prn sore throat. 210 mL 1   No facility-administered medications prior to visit.     PAST MEDICAL HISTORY: Past Medical History:  Diagnosis Date  . Allergy   . Anemia   . Anxiety   . Arthritis   . Chronic headache disorder    s/p Meningitis x 2, tension- type plus allergic/sinus problems  . History of abnormal Pap smear   . Meningitis, viral At age 73 and at age 57  . Nasal fracture 2011  . Other and unspecified ovarian cysts 2011- pelvic CT   GYN- Dr. Chevis Pretty  . Vision abnormalities     PAST  SURGICAL HISTORY: Past Surgical History:  Procedure Laterality Date  . BACK SURGERY  02/09/15   L5 - S1 diskectomy  . FOOT SURGERY  s/p 2 procedures  . LAPAROSCOPY FOR ECTOPIC PREGNANCY  1998   Fallopian tube removed  . MOUTH SURGERY    . spinal injection  03/10/15  . SPINE SURGERY    . UTERINE ARTERY EMBOLIZATION      FAMILY HISTORY: Family History  Problem Relation Age of Onset  . Migraines Father        cluster headache  . Hyperlipidemia Father   . Hypertension Father   . Cancer Mother 1       Breast  . Stroke Maternal Grandmother   . Diabetes Paternal Grandmother   . Diabetes Unknown        paternal relaitve  .  Seizures Unknown        maternal relative  . Rheum arthritis Unknown        maternal relatives    SOCIAL HISTORY:  Social History   Socioeconomic History  . Marital status: Married    Spouse name: Jesusita Oka  . Number of children: Not on file  . Years of education: Not on file  . Highest education level: Not on file  Occupational History  . Occupation: Chief Financial Officer: [employer]  Social Needs  . Financial resource strain: Not on file  . Food insecurity:    Worry: Not on file    Inability: Not on file  . Transportation needs:    Medical: Not on file    Non-medical: Not on file  Tobacco Use  . Smoking status: Former Games developer  . Smokeless tobacco: Never Used  Substance and Sexual Activity  . Alcohol use: Yes    Alcohol/week: 0.0 standard drinks  . Drug use: No  . Sexual activity: Not on file  Lifestyle  . Physical activity:    Days per week: Not on file    Minutes per session: Not on file  . Stress: Not on file  Relationships  . Social connections:    Talks on phone: Not on file    Gets together: Not on file    Attends religious service: Not on file    Active member of club or organization: Not on file    Attends meetings of clubs or organizations: Not on file    Relationship status: Not on file  . Intimate partner violence:     Fear of current or ex partner: Not on file    Emotionally abused: Not on file    Physically abused: Not on file    Forced sexual activity: Not on file  Other Topics Concern  . Not on file  Social History Narrative   Drinks about 12-16oz of coffee a day, occasionally drinks a tea.   Woks on computer as Systems developer.      PHYSICAL EXAM  GENERAL EXAM/CONSTITUTIONAL: Vitals:  Vitals:   12/10/17 1334  BP: 129/85  Pulse: 80  Weight: 212 lb 6.4 oz (96.3 kg)  Height: 5\' 4"  (1.626 m)     Body mass index is 36.46 kg/m. Wt Readings from Last 3 Encounters:  12/10/17 212 lb 6.4 oz (96.3 kg)  05/01/17 207 lb (93.9 kg)  03/05/17 210 lb 6.4 oz (95.4 kg)     Patient is in no distress; well developed, nourished and groomed; neck is supple  CARDIOVASCULAR:  Examination of carotid arteries is normal; no carotid bruits  Regular rate and rhythm, no murmurs  Examination of peripheral vascular system by observation and palpation is normal  EYES:  Ophthalmoscopic exam of optic discs and posterior segments is normal; no papilledema or hemorrhages  No exam data present  MUSCULOSKELETAL:  Gait, strength, tone, movements noted in Neurologic exam below  NEUROLOGIC: MENTAL STATUS:  No flowsheet data found.  awake, alert, oriented to person, place and time  recent and remote memory intact  normal attention and concentration  language fluent, comprehension intact, naming intact  fund of knowledge appropriate  CRANIAL NERVE:   2nd - no papilledema on fundoscopic exam  2nd, 3rd, 4th, 6th - pupils equal and reactive to light, visual fields full to confrontation, extraocular muscles intact, no nystagmus  5th - facial sensation symmetric  7th - facial strength symmetric  8th - hearing intact  9th -  palate elevates symmetrically, uvula midline  11th - shoulder shrug symmetric  12th - tongue protrusion midline  MOTOR:   normal bulk and tone, full strength in the BUE,  BLE  SENSORY:   normal and symmetric to light touch, temperature, vibration  COORDINATION:   finger-nose-finger, fine finger movements normal  REFLEXES:   deep tendon reflexes present and symmetric  GAIT/STATION:   narrow based gait     DIAGNOSTIC DATA (LABS, IMAGING, TESTING) - I reviewed patient records, labs, notes, testing and imaging myself where available.  Lab Results  Component Value Date   WBC 6.0 01/09/2017   HGB 11.5 (L) 01/09/2017   HCT 35.8 (L) 01/09/2017   MCV 89.2 01/09/2017   PLT 322.0 01/09/2017      Component Value Date/Time   NA 137 01/09/2017 1146   NA 140 01/03/2017 1551   K 4.0 01/09/2017 1146   CL 107 01/09/2017 1146   CO2 25 01/09/2017 1146   GLUCOSE 100 (H) 01/09/2017 1146   BUN 13 01/09/2017 1146   BUN 10 01/03/2017 1551   CREATININE 0.75 01/09/2017 1146   CREATININE 0.78 12/21/2016 0855   CALCIUM 9.0 01/09/2017 1146   PROT 7.0 01/09/2017 1146   PROT 7.2 01/03/2017 1551   ALBUMIN 4.2 01/09/2017 1146   ALBUMIN 4.3 01/03/2017 1551   AST 16 01/09/2017 1146   ALT 22 01/09/2017 1146   ALKPHOS 41 01/09/2017 1146   BILITOT 0.8 01/09/2017 1146   BILITOT 0.9 01/03/2017 1551   GFRNONAA 103 01/03/2017 1551   GFRNONAA >89 10/14/2014 0944   GFRAA 119 01/03/2017 1551   GFRAA >89 10/14/2014 0944   Lab Results  Component Value Date   CHOL 229 (H) 12/21/2016   HDL 51 12/21/2016   LDLCALC 150 (H) 12/21/2016   TRIG 150 (H) 12/21/2016   CHOLHDL 4.5 12/21/2016   Lab Results  Component Value Date   HGBA1C 5.3 12/16/2014   No results found for: VITAMINB12 Lab Results  Component Value Date   TSH 1.30 12/21/2016    10/22/14 MRI thoracic spine [I reviewed images myself and agree with interpretation. -VRP]  - Multilevel mild disc degeneration without definitive impingement. - Multilevel mild to moderate left foraminal stenoses are noted above.  11/27/14 MRI lumbar spine [I reviewed images myself and agree with interpretation. -VRP]  -  Disc bulging and facet hypertrophy L4-5 with mild to moderate spinal stenosis. - Large central disc protrusion L5-S1 with possible extruded disc fragments. There is severe spinal stenosis with compression of the thecal sac and S1 nerve roots bilaterally.   09/26/14 CT head [I reviewed images myself and agree with interpretation. -VRP]  - Stable and normal noncontrast CT appearance of the brain.  01/20/15 MRI brain  - normal  03/03/15 PSG / split - normal AHI - sleep fragmentation    ASSESSMENT AND PLAN  42 y.o. year old female here with history of recurrent aseptic/viral meningitis since 2008-2013, also with history of migraine type headaches since age 84 years old, never officially diagnosed. Also with recurrent migraine attacks.   Dx:    Migraine without aura and without status migrainosus, not intractable    PLAN:  I spent 15 minutes of face to face time with patient. Greater than 50% of time was spent in counseling and coordination of care with patient. This is necessary because patient's symptoms are not optimally controlled / improved. In summary we discussed: - Diagnostic results, impressions, or recommended diagnostic studies: migraine - Prognosis: fair - good - Risks  and benefits of management (treatment) options: aimovig reviewed - Patient and family education: sleep and exercise strategies reviewed  MIGRAINE WITHOUT AURA + CHRONIC DAILY HEADACHES (not controlled) - increase propranolol to 40mg  twice a day  - continue topiramate 100mg  twice a day  - continue rizatriptan as needed - trial of aimovig (CGRP antagonist) - continue exercise / PT regimen - sleep hygiene reviewed  Meds ordered this encounter  Medications  . Erenumab-aooe (AIMOVIG) 70 MG/ML SOAJ    Sig: Inject 70 mg into the skin every 30 (thirty) days.    Dispense:  3 pen    Refill:  4  . topiramate (TOPAMAX) 100 MG tablet    Sig: Take 1 tablet (100 mg total) by mouth 2 (two) times daily.     Dispense:  60 tablet    Refill:  12  . rizatriptan (MAXALT-MLT) 10 MG disintegrating tablet    Sig: Take 1 tablet (10 mg total) by mouth as needed for migraine. May repeat in 2 hours if needed    Dispense:  9 tablet    Refill:  11  . propranolol (INDERAL) 40 MG tablet    Sig: Take 1 tablet (40 mg total) by mouth 2 (two) times daily.    Dispense:  60 tablet    Refill:  12   Return in about 1 year (around 12/11/2018) for with NP Ethelene Browns).    Suanne Marker, MD 12/10/2017, 1:53 PM Certified in Neurology, Neurophysiology and Neuroimaging  [facility] [address] [place], [place] [address] [phone]

## 2017-12-19 ENCOUNTER — Telehealth: Payer: Self-pay | Admitting: *Deleted

## 2017-12-19 NOTE — Telephone Encounter (Signed)
Initiated [place]ig PA, 12-18-17.  Awaiting result. Key [id number].   Could be up to 72 hours.  I spoke to pt and she was to contact pharmacy about getting started on aimovig.  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

## 2017-12-24 NOTE — Telephone Encounter (Signed)
Received denial for aimovig, 70mg /ml due to criteria for episodic migraines, < 15 headaches per month, and 4-14 migraines days per month.   (pt has daily headaches, and 2 migraines /month) from last note.  LMVM for pt to return call , if she was able to get aimovig with access card).

## 2017-12-25 NOTE — Telephone Encounter (Signed)
[facility], I relayed that received denial for [place] PA.  Pt to use access card, or discount card to get product.  I relayed to pt as well.  She will call to get product.  She stated she called and spoke to pharmacy yesterday with information about discount card.  She will call them again.

## 2018-01-02 ENCOUNTER — Encounter: Payer: 59 | Admitting: Family Medicine

## 2018-02-15 ENCOUNTER — Encounter: Payer: Self-pay | Admitting: Family Medicine

## 2018-02-15 ENCOUNTER — Telehealth: Payer: Self-pay | Admitting: Family Medicine

## 2018-02-15 ENCOUNTER — Other Ambulatory Visit: Payer: Self-pay

## 2018-02-15 ENCOUNTER — Ambulatory Visit (INDEPENDENT_AMBULATORY_CARE_PROVIDER_SITE_OTHER): Payer: 59 | Admitting: Family Medicine

## 2018-02-15 VITALS — BP 141/88 | HR 82 | Temp 97.7°F | Resp 16 | Ht 63.25 in | Wt 204.8 lb

## 2018-02-15 DIAGNOSIS — R7989 Other specified abnormal findings of blood chemistry: Secondary | ICD-10-CM

## 2018-02-15 DIAGNOSIS — Z113 Encounter for screening for infections with a predominantly sexual mode of transmission: Secondary | ICD-10-CM

## 2018-02-15 DIAGNOSIS — Z6837 Body mass index (BMI) 37.0-37.9, adult: Secondary | ICD-10-CM | POA: Diagnosis not present

## 2018-02-15 DIAGNOSIS — R030 Elevated blood-pressure reading, without diagnosis of hypertension: Secondary | ICD-10-CM | POA: Diagnosis not present

## 2018-02-15 DIAGNOSIS — E782 Mixed hyperlipidemia: Secondary | ICD-10-CM | POA: Diagnosis not present

## 2018-02-15 DIAGNOSIS — H538 Other visual disturbances: Secondary | ICD-10-CM

## 2018-02-15 DIAGNOSIS — E878 Other disorders of electrolyte and fluid balance, not elsewhere classified: Secondary | ICD-10-CM | POA: Diagnosis not present

## 2018-02-15 DIAGNOSIS — R51 Headache: Secondary | ICD-10-CM

## 2018-02-15 DIAGNOSIS — Z0001 Encounter for general adult medical examination with abnormal findings: Secondary | ICD-10-CM | POA: Diagnosis not present

## 2018-02-15 DIAGNOSIS — G8929 Other chronic pain: Secondary | ICD-10-CM

## 2018-02-15 DIAGNOSIS — E559 Vitamin D deficiency, unspecified: Secondary | ICD-10-CM

## 2018-02-15 DIAGNOSIS — Z0000 Encounter for general adult medical examination without abnormal findings: Secondary | ICD-10-CM

## 2018-02-15 DIAGNOSIS — Z8661 Personal history of infections of the central nervous system: Secondary | ICD-10-CM

## 2018-02-15 DIAGNOSIS — J310 Chronic rhinitis: Secondary | ICD-10-CM

## 2018-02-15 DIAGNOSIS — L304 Erythema intertrigo: Secondary | ICD-10-CM | POA: Diagnosis not present

## 2018-02-15 DIAGNOSIS — R7401 Elevation of levels of liver transaminase levels: Secondary | ICD-10-CM

## 2018-02-15 DIAGNOSIS — E6609 Other obesity due to excess calories: Secondary | ICD-10-CM | POA: Diagnosis not present

## 2018-02-15 DIAGNOSIS — Z124 Encounter for screening for malignant neoplasm of cervix: Secondary | ICD-10-CM | POA: Diagnosis not present

## 2018-02-15 DIAGNOSIS — R740 Nonspecific elevation of levels of transaminase and lactic acid dehydrogenase [LDH]: Secondary | ICD-10-CM

## 2018-02-15 DIAGNOSIS — R519 Headache, unspecified: Secondary | ICD-10-CM

## 2018-02-15 DIAGNOSIS — R7309 Other abnormal glucose: Secondary | ICD-10-CM

## 2018-02-15 DIAGNOSIS — D649 Anemia, unspecified: Secondary | ICD-10-CM

## 2018-02-15 DIAGNOSIS — R945 Abnormal results of liver function studies: Secondary | ICD-10-CM

## 2018-02-15 LAB — POCT URINALYSIS DIP (MANUAL ENTRY)
BILIRUBIN UA: NEGATIVE
Blood, UA: NEGATIVE
GLUCOSE UA: NEGATIVE mg/dL
Ketones, POC UA: NEGATIVE mg/dL
LEUKOCYTES UA: NEGATIVE
Nitrite, UA: NEGATIVE
Protein Ur, POC: NEGATIVE mg/dL
Spec Grav, UA: 1.01 (ref 1.010–1.025)
Urobilinogen, UA: 0.2 E.U./dL
pH, UA: 7.5 (ref 5.0–8.0)

## 2018-02-15 LAB — POCT SKIN KOH: Skin KOH, POC: NEGATIVE

## 2018-02-15 MED ORDER — VALACYCLOVIR HCL 500 MG PO TABS: 500.0000 mg | ORAL_TABLET | Freq: Two times a day (BID) | ORAL | 4 refills | Status: DC

## 2018-02-15 MED ORDER — OMEPRAZOLE 40 MG PO CPDR: DELAYED_RELEASE_CAPSULE | ORAL | 0 refills | Status: DC

## 2018-02-15 NOTE — Patient Instructions (Addendum)
You have lost 8 lbs in past 2 mos! Keep up the good work!!!!  If you can't get in with derm for a while, come back so we can do a little skin biopsy to get you a diagnosis and then start a treatment.  I don't want to start an imperic treatment prior as I am concerned it would make an accurate diagnosis much harder to get as it would change its appearance and could even lead to false negatives on biopsy or make biopsy more difficult to accurately interpret by the pathologist    If you have lab work done today you will be contacted with your lab results within the next 2 weeks.  If you have not heard from [place] then please contact us. The fastest way to get your results is to register for My Chart.   IF you received an x-ray today, you will receive an invoice from [facility]. Please contact [facility] at [phone] with questions or concerns regarding your invoice.   IF you received labwork today, you will receive an invoice from [facility]. Please contact [facility] at [phone] with questions or concerns regarding your invoice.   Our billing staff will not be able to assist you with questions regarding bills from these companies.  You will be contacted with the lab results as soon as they are available. The fastest way to get your results is to activate your My Chart account. Instructions are located on the last page of this paperwork. If you have not heard from [place] regarding the results in 2 weeks, please contact this office.    Intertrigo Intertrigo is skin irritation or inflammation (dermatitis) that occurs when folds of skin rub together. The irritation can cause a rash and make skin raw and itchy. This condition most commonly occurs in the skin folds of these areas:  Toes.  Armpits.  Groin.  Belly.  Breasts.  Buttocks.  Intertrigo is not passed from person to person (is not contagious). What are the causes? This condition is caused by heat, moisture, friction,  and lack of air circulation. The condition can be made worse by:  Sweat.  Bacteria or a fungus, such as yeast.  What increases the risk? This condition is more likely to occur if you have moisture in your skin folds. It is also more likely to develop in people who:  Have diabetes.  Are overweight.  Are on bed rest.  Live in a warm and moist climate.  Wear splints, braces, or other medical devices.  Are not able to control their bowels or bladder (have incontinence).  What are the signs or symptoms? Symptoms of this condition include:  A pink or red skin rash.  Brown patches on the skin.  Raw or scaly skin.  Itchiness.  A burning feeling.  Bleeding.  Leaking fluid.  A bad smell.  How is this diagnosed? This condition is diagnosed with a medical history and physical exam. You may also have a skin swab to test for bacteria or a fungus, such as yeast. How is this treated? Treatment may include:  Cleaning and drying your skin.  An oral antibiotic medicine or antibiotic skin cream for a bacterial infection.  Antifungal cream or pills for an infection that was caused by a fungus, such as yeast.  Steroid ointment to relieve itchiness and irritation.  Follow these instructions at home:  Keep the affected area clean and dry.  Do not scratch your skin.  Stay in a cool environment as  much as possible. Use an air conditioner or fan, if available.  Apply over-the-counter and prescription medicines only as told by your health care provider.  If you were prescribed an antibiotic medicine, use it as told by your health care provider. Do not stop using the antibiotic even if your condition improves.  Keep all follow-up visits as told by your health care provider. This is important. How is this prevented?  Maintain a healthy weight.  Take care of your feet, especially if you have diabetes. Foot care includes: ? Wearing shoes that fit well. ? Keeping your feet  dry. ? Wearing clean, breathable socks.  Protect the skin around your groin and buttocks, especially if you have incontinence. Skin protection includes: ? Following a regular cleaning routine. ? Using moisturizers and skin protectants. ? Changing protection pads frequently.  Do not wear tight clothes. Wear clothes that are loose and absorbent. Wear clothes that are made of cotton.  Wear a bra that gives good support, if needed.  Shower and dry yourself thoroughly after activity. Use a hair dryer on a cool setting to dry between skin folds, especially after you bathe.  If you have diabetes, keep your blood sugar under control. Contact a health care provider if:  Your symptoms do not improve with treatment.  Your symptoms get worse or they spread.  You notice increased redness and warmth.  You have a fever. This information is not intended to replace advice given to you by your health care provider. Make sure you discuss any questions you have with your health care provider. Document Released: 03/20/2005 Document Revised: 08/26/2015 Document Reviewed: 09/21/2014 Elsevier Interactive Patient Education  2018 [employer] Maintenance, Female Adopting a healthy lifestyle and getting preventive care can go a long way to promote health and wellness. Talk with your health care provider about what schedule of regular examinations is right for you. This is a good chance for you to check in with your provider about disease prevention and staying healthy. In between checkups, there are plenty of things you can do on your own. Experts have done a lot of research about which lifestyle changes and preventive measures are most likely to keep you healthy. Ask your health care provider for more information. Weight and diet Eat a healthy diet  Be sure to include plenty of vegetables, fruits, low-fat dairy products, and lean protein.  Do not eat a lot of foods high in solid fats, added  sugars, or salt.  Get regular exercise. This is one of the most important things you can do for your health. ? Most adults should exercise for at least 150 minutes each week. The exercise should increase your heart rate and make you sweat (moderate-intensity exercise). ? Most adults should also do strengthening exercises at least twice a week. This is in addition to the moderate-intensity exercise.  Maintain a healthy weight  Body mass index (BMI) is a measurement that can be used to identify possible weight problems. It estimates body fat based on height and weight. Your health care provider can help determine your BMI and help you achieve or maintain a healthy weight.  For females 56 years of age and older: ? A BMI below 18.5 is considered underweight. ? A BMI of 18.5 to 24.9 is normal. ? A BMI of 25 to 29.9 is considered overweight. ? A BMI of 30 and above is considered obese.  Watch levels of cholesterol and blood lipids  You should start having your  blood tested for lipids and cholesterol at 44 years of age, then have this test every 5 years.  You may need to have your cholesterol levels checked more often if: ? Your lipid or cholesterol levels are high. ? You are older than 44 years of age. ? You are at high risk for heart disease.  Cancer screening Lung Cancer  Lung cancer screening is recommended for adults 28-27 years old who are at high risk for lung cancer because of a history of smoking.  A yearly low-dose CT scan of the lungs is recommended for people who: ? Currently smoke. ? Have quit within the past 15 years. ? Have at least a 30-pack-year history of smoking. A pack year is smoking an average of one pack of cigarettes a day for 1 year.  Yearly screening should continue until it has been 15 years since you quit.  Yearly screening should stop if you develop a health problem that would prevent you from having lung cancer treatment.  Breast Cancer  Practice breast  self-awareness. This means understanding how your breasts normally appear and feel.  It also means doing regular breast self-exams. Let your health care provider know about any changes, no matter how small.  If you are in your 20s or 30s, you should have a clinical breast exam (CBE) by a health care provider every 1-3 years as part of a regular health exam.  If you are 43 or older, have a CBE every year. Also consider having a breast X-ray (mammogram) every year.  If you have a family history of breast cancer, talk to your health care provider about genetic screening.  If you are at high risk for breast cancer, talk to your health care provider about having an MRI and a mammogram every year.  Breast cancer gene (BRCA) assessment is recommended for women who have family members with BRCA-related cancers. BRCA-related cancers include: ? Breast. ? Ovarian. ? Tubal. ? Peritoneal cancers.  Results of the assessment will determine the need for genetic counseling and BRCA1 and BRCA2 testing.  Cervical Cancer Your health care provider may recommend that you be screened regularly for cancer of the pelvic organs (ovaries, uterus, and vagina). This screening involves a pelvic examination, including checking for microscopic changes to the surface of your cervix (Pap test). You may be encouraged to have this screening done every 3 years, beginning at age 83.  For women ages 71-65, health care providers may recommend pelvic exams and Pap testing every 3 years, or they may recommend the Pap and pelvic exam, combined with testing for human papilloma virus (HPV), every 5 years. Some types of HPV increase your risk of cervical cancer. Testing for HPV may also be done on women of any age with unclear Pap test results.  Other health care providers may not recommend any screening for nonpregnant women who are considered low risk for pelvic cancer and who do not have symptoms. Ask your health care provider if a  screening pelvic exam is right for you.  If you have had past treatment for cervical cancer or a condition that could lead to cancer, you need Pap tests and screening for cancer for at least 20 years after your treatment. If Pap tests have been discontinued, your risk factors (such as having a new sexual partner) need to be reassessed to determine if screening should resume. Some women have medical problems that increase the chance of getting cervical cancer. In these cases, your health care provider  may recommend more frequent screening and Pap tests.  Colorectal Cancer  This type of cancer can be detected and often prevented.  Routine colorectal cancer screening usually begins at 44 years of age and continues through 44 years of age.  Your health care provider may recommend screening at an earlier age if you have risk factors for colon cancer.  Your health care provider may also recommend using home test kits to check for hidden blood in the stool.  A small camera at the end of a tube can be used to examine your colon directly (sigmoidoscopy or colonoscopy). This is done to check for the earliest forms of colorectal cancer.  Routine screening usually begins at age 42.  Direct examination of the colon should be repeated every 5-10 years through 44 years of age. However, you may need to be screened more often if early forms of precancerous polyps or small growths are found.  Skin Cancer  Check your skin from head to toe regularly.  Tell your health care provider about any new moles or changes in moles, especially if there is a change in a mole's shape or color.  Also tell your health care provider if you have a mole that is larger than the size of a pencil eraser.  Always use sunscreen. Apply sunscreen liberally and repeatedly throughout the day.  Protect yourself by wearing long sleeves, pants, a wide-brimmed hat, and sunglasses whenever you are outside.  Heart disease, diabetes, and  high blood pressure  High blood pressure causes heart disease and increases the risk of stroke. High blood pressure is more likely to develop in: ? People who have blood pressure in the high end of the normal range (130-139/85-89 mm Hg). ? People who are overweight or obese. ? People who are African American.  If you are 21-60 years of age, have your blood pressure checked every 3-5 years. If you are 57 years of age or older, have your blood pressure checked every year. You should have your blood pressure measured twice-once when you are at a hospital or clinic, and once when you are not at a hospital or clinic. Record the average of the two measurements. To check your blood pressure when you are not at a hospital or clinic, you can use: ? An automated blood pressure machine at a pharmacy. ? A home blood pressure monitor.  If you are between 17 years and 47 years old, ask your health care provider if you should take aspirin to prevent strokes.  Have regular diabetes screenings. This involves taking a blood sample to check your fasting blood sugar level. ? If you are at a normal weight and have a low risk for diabetes, have this test once every three years after 44 years of age. ? If you are overweight and have a high risk for diabetes, consider being tested at a younger age or more often. Preventing infection Hepatitis B  If you have a higher risk for hepatitis B, you should be screened for this virus. You are considered at high risk for hepatitis B if: ? You were born in a country where hepatitis B is common. Ask your health care provider which countries are considered high risk. ? Your parents were born in a high-risk country, and you have not been immunized against hepatitis B (hepatitis B vaccine). ? You have HIV or AIDS. ? You use needles to inject street drugs. ? You live with someone who has hepatitis B. ? You have had  sex with someone who has hepatitis B. ? You get hemodialysis  treatment. ? You take certain medicines for conditions, including cancer, organ transplantation, and autoimmune conditions.  Hepatitis C  Blood testing is recommended for: ? Everyone born from [birth year] through 1967. ? Anyone with known risk factors for hepatitis C.  Sexually transmitted infections (STIs)  You should be screened for sexually transmitted infections (STIs) including gonorrhea and chlamydia if: ? You are sexually active and are younger than 44 years of age. ? You are older than 44 years of age and your health care provider tells you that you are at risk for this type of infection. ? Your sexual activity has changed since you were last screened and you are at an increased risk for chlamydia or gonorrhea. Ask your health care provider if you are at risk.  If you do not have HIV, but are at risk, it may be recommended that you take a prescription medicine daily to prevent HIV infection. This is called pre-exposure prophylaxis (PrEP). You are considered at risk if: ? You are sexually active and do not regularly use condoms or know the HIV status of your partner(s). ? You take drugs by injection. ? You are sexually active with a partner who has HIV.  Talk with your health care provider about whether you are at high risk of being infected with HIV. If you choose to begin PrEP, you should first be tested for HIV. You should then be tested every 3 months for as long as you are taking PrEP. Pregnancy  If you are premenopausal and you may become pregnant, ask your health care provider about preconception counseling.  If you may become pregnant, take 400 to 800 micrograms (mcg) of folic acid every day.  If you want to prevent pregnancy, talk to your health care provider about birth control (contraception). Osteoporosis and menopause  Osteoporosis is a disease in which the bones lose minerals and strength with aging. This can result in serious bone fractures. Your risk for osteoporosis  can be identified using a bone density scan.  If you are 73 years of age or older, or if you are at risk for osteoporosis and fractures, ask your health care provider if you should be screened.  Ask your health care provider whether you should take a calcium or vitamin D supplement to lower your risk for osteoporosis.  Menopause may have certain physical symptoms and risks.  Hormone replacement therapy may reduce some of these symptoms and risks. Talk to your health care provider about whether hormone replacement therapy is right for you. Follow these instructions at home:  Schedule regular health, dental, and eye exams.  Stay current with your immunizations.  Do not use any tobacco products including cigarettes, chewing tobacco, or electronic cigarettes.  If you are pregnant, do not drink alcohol.  If you are breastfeeding, limit how much and how often you drink alcohol.  Limit alcohol intake to no more than 1 drink per day for nonpregnant women. One drink equals 12 ounces of beer, 5 ounces of wine, or 1 ounces of hard liquor.  Do not use street drugs.  Do not share needles.  Ask your health care provider for help if you need support or information about quitting drugs.  Tell your health care provider if you often feel depressed.  Tell your health care provider if you have ever been abused or do not feel safe at home. This information is not intended to replace advice given to  you by your health care provider. Make sure you discuss any questions you have with your health care provider. Document Released: 10/03/2010 Document Revised: 08/26/2015 Document Reviewed: 12/22/2014 Elsevier Interactive Patient Education  [employer].

## 2018-02-15 NOTE — Progress Notes (Addendum)
Subjective:    [name]; female   DOB: 1975/07/26; 42 y.o.   MRN: [id number]  Chief Complaint  Patient presents with  . Annual Exam    HPI  I have not seen pt in over a year. Her last CPE was 12/21/2016 Primary Preventative Screenings: Cervical Cancer: follows w/ gyn  at [facility] (Mezer retired now) was seen there in Feb but didn't do pap at that time, pap due as distant h/o abnml and last reported pap 03/2014 Family Planning:Nexplanon in place - done 2017 so good till 2020. occ spotting but no reg menses from nexplanon STI screening: neg hiv screen 2013, requests to be done today. denies vag d/c or itching, when ran out of valtrex had very painful sores develop on labia - assumes it must have been an hsv outbreak that the valtrex (which she is on for h/o recurrent viral meningitis) was keeping suppressed - sores/pain/hsv sxs now completley resolved but she had never had anything like this prior and didn't know she had genital hsv but suspect she does have. Breast Cancer: + FHx of mammogram in mother in her 31s so pt needs to keep up with annual screen (last we have pt report is 04/2016). normal mammograms though gyn - done at the office. Last done 05/2017 at [facility] as well as genetic testing which was normal.  Colorectal Cancer: NA due to age and no FHx Tobacco use/EtOH/substances: H/o tob use but occ slips up - smoked regularly until 2010 - no more than 1/4 ppd, occ social EtOH use. Bone Density: NA due to age Cardiac: EKG 02/29/16 Weight/Blood sugar/Diet/Exercise: BMI Readings from Last 3 Encounters:  12/10/17 36.46 kg/m  05/01/17 35.34 kg/m  03/05/17 35.56 kg/m   Lab Results  Component Value Date   HGBA1C 5.3 12/16/2014   OTC/Vit/Supp/Herbal: She takes Vitamin A, C, D3 ~2000u/d, calcium 1200, mag, and Super B-complex. Lutein, K gluconate, CoQ10. Occ aleve/tylenol. Glucosamine-chondroitin Dentist/Optho:Every 6 months with Dentist. Seen  [facility] Immunizations:  Immunization History  Administered Date(s) Administered  . Influenza Split 02/01/2011, 01/02/2012, 01/01/2014  . Influenza-Unspecified 01/29/2017, 01/14/2018  . Td 12/16/2014  . Tdap 01/30/2006    Chronic Medical Conditions:  Migraine: followed q59mos by Dr. Marjory Lies at [facility]. topamax w/ Prn meloxicam, maxalt, and propranolol. Recently started on aimovig as well. Has had HAs all of her life but in her 20-30s began to get debilitating HAs. Has a chronic daily HA which occ flairs more.   H/o multiple occurrences of viral meningitis requiring 5d hosp where she would just be given pain medicines and a big bill. Now she knows to avoid stress triggers and so whenever she starts to feel a flair of the mengitis type sxs she starts the valtrex and sxs resolve after 2-3d. Now has episodes < 1/yr -tends to happen more when she travels - not sure why.  Ran out of valtrex and couldn't get refills from our office so as soon as she had to stop to taking it she had vaginal HSV outbreak which was the original outbreak she had every noticed as well as recurrence of the HAs with meningitis sxs   Insomnia: prev was rx'd rozerem but not currently needing rx med, sleeping well GERD: prn prilosec 40 - taking very rarely Chronic allergic rhinitis/sinusitis: [facility] flonase & allegy eye drops not helpful and neurologist told her to stay away from antihistamines as could make  migraines worse. Dr. Nunzio CobbsBobbitt told her no allergies. Intertrigo: Dr. Nunzio CobbsBobbitt but did eval rash between breast that she has had FOREVER and clotrimazole from Dr. Nunzio CobbsBobbitt used for several months and did get some improvement but then came back much worse as soon as she stopped it after regu routine use for mos. Has also tried tea tree oil which didn't help much - soothed it little. Is a little itchy but mainly burning, dry flaky, raw. A little worse when sweat but  otherwise not affected as much by heat, hot showers, not worse at night. Dryiness makes it much worse. Used otc hydrocortisone once which caused it to burn. NOt drainig anyting. Moving up to neck. Does have 2 dogs and a cat at home. H/o vit D def:  NOt sure what total amount she is taking as taking capsule indpendently as well as some in mixed Lab Results  Component Value Date   VD25OH 30 12/21/2016   VD25OH 31 12/20/2015   VD25OH 25 (L) 11/28/2013   VD25OH 24 (L) 11/26/2012   Obesity: Has lost 8 lbs in past 2 mos! Keep up the good work  Medical History: Past Medical History:  Diagnosis Date  . Allergy   . Anemia   . Anxiety   . Arthritis   . Chronic headache disorder    s/p Meningitis x 2, tension- type plus allergic/sinus problems  . History of abnormal Pap smear   . Meningitis, viral At age 42 and at age 89  . Nasal fracture 2011  . Other and unspecified ovarian cysts 2011- pelvic CT   GYN- Dr. Chevis PrettyMezer  . Vision abnormalities    Past Surgical History:  Procedure Laterality Date  . BACK SURGERY  02/09/15   L5 - S1 diskectomy  . FOOT SURGERY  s/p 2 procedures  . LAPAROSCOPY FOR ECTOPIC PREGNANCY  1998   Fallopian tube removed  . MOUTH SURGERY    . spinal injection  03/10/15  . SPINE SURGERY    . UTERINE ARTERY EMBOLIZATION     Current Outpatient Medications on File Prior to Visit  Medication Sig Dispense Refill  . B Complex-C (SUPER B COMPLEX/VITAMIN C PO) Take by mouth.    . benzonatate (TESSALON) 200 MG capsule Take 1 capsule (200 mg total) by mouth 2 (two) times daily as needed for cough. 20 capsule 0  . Calcium-Magnesium-Vitamin D (CALCIUM 1200+D3 PO) Take 1,200 Units by mouth.    . Cholecalciferol (D3-1000 PO) Take 2,000 Units by mouth.    . clotrimazole (CLOTRIMAZOLE AF) 1 % cream Apply sparingly to affected areas 2 times daily for 2-4 weeks. 60 g 0  . Coenzyme Q10 (CO Q 10 PO) Take 50 mg by mouth daily.    Dorise Hiss. Erenumab-aooe (AIMOVIG) 70 MG/ML SOAJ Inject 70 mg into  the skin every 30 (thirty) days. 3 pen 4  . GLUCOSAMINE-CHONDROITIN PO Take 1 tablet by mouth daily.    Marland Kitchen. HYDROcodone-homatropine (HYCODAN) 5-1.5 MG/5ML syrup Take 5 mLs by mouth every 8 (eight) hours as needed for cough. 120 mL 0  . Lutein 40 MG CAPS Take 40 mg by mouth daily.    . Magnesium 500 MG CAPS Take 500 mg by mouth 4 (four) times daily.    . meloxicam (MOBIC) 15 MG tablet Take 15 mg by mouth daily.    Marland Kitchen. omeprazole (PRILOSEC) 40 MG capsule TAKE 1 CAPSULE BY MOUTH TWICE A DAY BEFORE MEALS BY 30 MINTUES 60 capsule 0  . Potassium Gluconate 595 MG CAPS Take  595 mg by mouth daily.    . propranolol (INDERAL) 40 MG tablet Take 1 tablet (40 mg total) by mouth 2 (two) times daily. 60 tablet 12  . rizatriptan (MAXALT-MLT) 10 MG disintegrating tablet Take 1 tablet (10 mg total) by mouth as needed for migraine. May repeat in 2 hours if needed 9 tablet 11  . topiramate (TOPAMAX) 100 MG tablet Take 1 tablet (100 mg total) by mouth 2 (two) times daily. 60 tablet 12  . valACYclovir (VALTREX) 500 MG tablet Take 1 tablet (500 mg total) by mouth 2 (two) times daily. 60 tablet 0  . vitamin A 8000 UNIT capsule Take 8,000 Units by mouth daily.    . vitamin C (ASCORBIC ACID) 500 MG tablet Take 500 mg by mouth daily.     No current facility-administered medications on file prior to visit.    No Known Allergies Family History  Problem Relation Age of Onset  . Migraines Father        cluster headache  . Hyperlipidemia Father   . Hypertension Father   . Cancer Mother 23       Breast  . Stroke Maternal Grandmother   . Diabetes Paternal Grandmother   . Diabetes Unknown        paternal relaitve  . Seizures Unknown        maternal relative  . Rheum arthritis Unknown        maternal relatives   Social History   Socioeconomic History  . Marital status: Married    Spouse name: Jesusita Oka  . Number of children: Not on file  . Years of education: Not on file  . Highest education level: Not on file    Occupational History  . Occupation: Chief Financial Officer: [employer]  Social Needs  . Financial resource strain: Not on file  . Food insecurity:    Worry: Not on file    Inability: Not on file  . Transportation needs:    Medical: Not on file    Non-medical: Not on file  Tobacco Use  . Smoking status: Former Games developer  . Smokeless tobacco: Never Used  Substance and Sexual Activity  . Alcohol use: Yes    Alcohol/week: 0.0 standard drinks  . Drug use: No  . Sexual activity: Not on file  Lifestyle  . Physical activity:    Days per week: Not on file    Minutes per session: Not on file  . Stress: Not on file  Relationships  . Social connections:    Talks on phone: Not on file    Gets together: Not on file    Attends religious service: Not on file    Active member of club or organization: Not on file    Attends meetings of clubs or organizations: Not on file    Relationship status: Not on file  Other Topics Concern  . Not on file  Social History Narrative   Drinks about 12-16oz of coffee a day, occasionally drinks a tea.   [employer] on computer as Systems developer.    Depression screen [facility] 2/9 02/15/2018 05/01/2017 01/06/2017 01/03/2017 12/21/2016  Decreased Interest 0 0 0 0 0  Down, Depressed, Hopeless 0 0 0 0 0  PHQ - 2 Score 0 0 0 0 0  Altered sleeping - - - - -  Tired, decreased energy - - - - -  Change in appetite - - - - -  Feeling bad or failure about yourself  - - - - -  Trouble concentrating - - - - -  Moving slowly or fidgety/restless - - - - -  Suicidal thoughts - - - - -  PHQ-9 Score - - - - -     Review of Systems  Constitutional: Positive for malaise/fatigue.  HENT: Positive for congestion (runny nose).   Eyes: Positive for blurred vision, discharge and redness.  Musculoskeletal: Positive for joint pain (left hip and left thumb) and myalgias.  Skin: Positive for rash (central chest, sternal area between cleavage starting to extend up neck maybe).  All other  systems reviewed and are negative. Otherwise as noted in HPI     Objective:  BP (!) 141/88 (BP Location: Right Arm, Patient Position: Sitting, Cuff Size: Large)   Pulse 82   Temp 97.7 F (36.5 C) (Oral)   Resp 16   Ht 5' 3.25" (1.607 m)   Wt 204 lb 12.8 oz (92.9 kg)   SpO2 99%   BMI 35.99 kg/m   Visual Acuity Screening   Right eye Left eye Both eyes  Without correction:     With correction: 20/20 20/20 20/20    Physical Exam  Constitutional: She is oriented to person, place, and time. She appears well-developed and well-nourished. No distress.  HENT:  Head: Normocephalic and atraumatic.  Right Ear: Tympanic membrane, external ear and ear canal normal.  Left Ear: Tympanic membrane, external ear and ear canal normal.  Nose: Nose normal. No mucosal edema or rhinorrhea.  Mouth/Throat: Uvula is midline, oropharynx is clear and moist and mucous membranes are normal. No posterior oropharyngeal erythema.  Eyes: Pupils are equal, round, and reactive to light. Conjunctivae and EOM are normal. Right eye exhibits no discharge. Left eye exhibits no discharge. No scleral icterus.  Neck: Normal range of motion. Neck supple. No thyromegaly present.  Cardiovascular: Normal rate, regular rhythm, normal heart sounds and intact distal pulses.  Pulmonary/Chest: Effort normal and breath sounds normal. No respiratory distress.  Abdominal: Soft. Bowel sounds are normal. There is no tenderness.  Genitourinary: Vagina normal. There is no rash, tenderness or lesion on the right labia. There is no rash, tenderness or lesion on the left labia. Uterus is not deviated, not enlarged, not fixed and not tender. Cervix exhibits discharge (normal mucoid). Cervix exhibits no motion tenderness and no friability. Right adnexum displays no mass, no tenderness and no fullness. Left adnexum displays no mass, no tenderness and no fullness. No erythema or tenderness in the vagina.  Musculoskeletal: She exhibits no edema.    Lymphadenopathy:    She has no cervical adenopathy.  Neurological: She is alert and oriented to person, place, and time. She has normal reflexes.  Skin: Skin is warm and dry. Rash noted. Rash is maculopapular (small areas of tiny sev mm hyperpig macular papular on illdefined hypopigmented skin on chest wall intertriginous area between breasts - see pic, none below breasts or around neck. KOH scraping neg. No flouresence under wood's lamp.). She is not diaphoretic. No erythema.  Psychiatric: She has a normal mood and affect. Her behavior is normal.      POC TESTING Office Visit on 02/15/2018  Component Date Value Ref Range Status  . Color, UA 02/15/2018 yellow  yellow Final  . Clarity, UA 02/15/2018 clear  clear Final  . Glucose, UA 02/15/2018 negative  negative mg/dL Final  . Bilirubin, UA 02/15/2018 negative  negative Final  . Ketones, POC UA 02/15/2018 negative  negative mg/dL Final  . Spec Grav, UA 02/15/2018 1.010  1.010 - 1.025 Final  .  Blood, UA 02/15/2018 negative  negative Final  . pH, UA 02/15/2018 7.5  5.0 - 8.0 Final  . Protein Ur, POC 02/15/2018 negative  negative mg/dL Final  . Urobilinogen, UA 02/15/2018 0.2  0.2 or 1.0 E.U./dL Final  . Nitrite, UA 96/29/5284 Negative  Negative Final  . Leukocytes, UA 02/15/2018 Negative  Negative Final  . Skin KOH, POC 02/15/2018 Negative  Negative Final     Assessment & Plan:   1. Annual physical exam   2. Chronic nonintractable headache, unspecified headache type - recently started on aimovig by neurologist Dr. Marjory Lies - still w/ prn rizotriptan  3. Class 2 obesity due to excess calories without serious comorbidity with body mass index (BMI) of 37.0 to 37.9 in adult - encouraged to cont, reviewed importance of adding in exercise, ways to do this  4. Chronic anemia   5. History of meningitis -sev episodes prior so stay on prophylactic valtrex - ok to refill whenever requested  6. Chronic rhinitis   7. Vitamin D deficiency   8.  Elevated glucose   9. Mixed hyperlipidemia   10. Intertrigo - see above, minimal improvement with antifungal - does not have the typical appearance of tinea or candidiasis. Advised reluctant to try top steroid or mix (lotrisone) when unsure of diagnosis as if doesn't resolve, may change appearance of rash enough to make diagnosis by derm more difficult - refer to derm but if appt to far and sxs worsening, RTC for shave biopsy for dx.  11. Screening for cervical cancer - seen by [facility]  12. Routine screening for STI (sexually transmitted infection) - had genital hsv outbreak when ran out of valtrex for recurrent viral meningitis however no prior h/o genital hsv - sxs now completley resolved so no way to clx - reviewed unlikely any benefit from blood abx testing as any + would not confirm hsv as cause of genital sores - if recurs, rtc immed for clx  13. Elevated blood pressure reading - today, monitor outside office  14. Blurred vision      Patient will continue on current chronic medications other than changes noted above, so ok to refill when needed.   Reviewed all health maintenance recommendations per USPSTF guidelines.   See after visit summary for patient specific instructions.  Orders Placed This Encounter  Procedures  . TSH  . Lipid panel    Order Specific Question:   Has the patient fasted?    Answer:   Yes  . Comprehensive metabolic panel    Order Specific Question:   Has the patient fasted?    Answer:   Yes  . CBC with Differential/Platelet  . VITAMIN D 25 Hydroxy (Vit-D Deficiency, Fractures)  . Hemoglobin A1c  . HCV Ab w/Rflx to Verification  . HIV Antibody (routine testing w rflx)  . RPR  . Interpretation:  . Ambulatory referral to Dermatology    Referral Priority:   Routine    Referral Type:   Consultation    Referral Reason:   Specialty Services Required    Requested Specialty:   Dermatology    Number of Visits Requested:   1  . Ambulatory referral to  Ophthalmology    Referral Priority:   Routine    Referral Type:   Consultation    Referral Reason:   Specialty Services Required    Referred to Provider:   Sallye Lat, MD    Requested Specialty:   Ophthalmology    Number of Visits Requested:  1  . Care order/instruction:    Scheduling Instructions:     Recheck BP  . POCT urinalysis dipstick  . POCT Skin KOH    Meds ordered this encounter  Medications  . valACYclovir (VALTREX) 500 MG tablet    Sig: Take 1 tablet (500 mg total) by mouth 2 (two) times daily.    Dispense:  180 tablet    Refill:  4  . omeprazole (PRILOSEC) 40 MG capsule    Sig: TAKE 1 CAPSULE BY MOUTH TWICE A DAY as needed BEFORE MEALS BY 30 MINTUES    Dispense:  60 capsule    Refill:  0    Patient verbalized to me that they understand the following: diagnosis, what is being done for them, what to expect and what should be done at home.  Their questions have been answered. They understand that I am unable to predict every possible medication interaction or adverse outcome and that if any unexpected symptoms arise, they should contact us and their pharmacist, as well as never hesitate to seek urgent/emergent care at [facility] or ER if they think it might be warranted.    [name], MD, MPH Primary Care at [facility] [address] [place], [place]  [phone] Office phone  [phone] Office fax   02/15/18 10:12 AM

## 2018-02-15 NOTE — Telephone Encounter (Signed)
Copied from CRM [id number]. Topic: General - Other >> Feb 15, 2018 12:12 PM Percival SpanishKennedy, Cheryl W wrote:  Pt husband said Dr Clelia CroftShaw wanted to know the name of the dermatologist and he called to say it  [facility]

## 2018-02-16 LAB — HIV ANTIBODY (ROUTINE TESTING W REFLEX): HIV Screen 4th Generation wRfx: NONREACTIVE

## 2018-02-16 LAB — HCV AB W/RFLX TO VERIFICATION: HCV Ab: <0.1 s/co ratio (ref 0.0–0.9)

## 2018-02-16 LAB — RPR: RPR: NONREACTIVE

## 2018-02-16 LAB — HCV INTERPRETATION

## 2018-02-17 ENCOUNTER — Encounter: Payer: Self-pay | Admitting: Family Medicine

## 2018-02-17 NOTE — Telephone Encounter (Signed)
Message sent to Dr. Clelia CroftShaw and Referrals Re: name of dermatologist [facility]

## 2018-02-19 ENCOUNTER — Encounter: Payer: Self-pay | Admitting: Family Medicine

## 2018-02-19 LAB — CBC WITH DIFFERENTIAL/PLATELET
Basophils Absolute: 0 10*3/uL (ref 0.0–0.2)
Basos: 1 %
EOS (ABSOLUTE): 0.1 10*3/uL (ref 0.0–0.4)
EOS: 1 %
HEMATOCRIT: 39.5 % (ref 34.0–46.6)
HEMOGLOBIN: 12.7 g/dL (ref 11.1–15.9)
IMMATURE GRANULOCYTES: 0 %
Immature Grans (Abs): 0 10*3/uL (ref 0.0–0.1)
LYMPHS ABS: 1.9 10*3/uL (ref 0.7–3.1)
Lymphs: 40 %
MCH: 29.4 pg (ref 26.6–33.0)
MCHC: 32.2 g/dL (ref 31.5–35.7)
MCV: 91 fL (ref 79–97)
MONOCYTES: 9 %
Monocytes Absolute: 0.4 10*3/uL (ref 0.1–0.9)
NEUTROS PCT: 49 %
Neutrophils Absolute: 2.3 10*3/uL (ref 1.4–7.0)
Platelets: 345 10*3/uL (ref 150–450)
RBC: 4.32 x10E6/uL (ref 3.77–5.28)
RDW: 12.2 % — ABNORMAL LOW (ref 12.3–15.4)
WBC: 4.7 10*3/uL (ref 3.4–10.8)

## 2018-02-19 LAB — COMPREHENSIVE METABOLIC PANEL
ALK PHOS: 49 IU/L (ref 39–117)
ALT: 75 IU/L — ABNORMAL HIGH (ref 0–32)
AST: 97 IU/L — ABNORMAL HIGH (ref 0–40)
Albumin/Globulin Ratio: 1.5 (ref 1.2–2.2)
Albumin: 4.5 g/dL (ref 3.5–5.5)
BUN / CREAT RATIO: 15 (ref 9–23)
BUN: 13 mg/dL (ref 6–24)
Bilirubin Total: 0.8 mg/dL (ref 0.0–1.2)
CALCIUM: 9.3 mg/dL (ref 8.7–10.2)
CO2: 15 mmol/L — AB (ref 20–29)
CREATININE: 0.87 mg/dL (ref 0.57–1.00)
Chloride: 107 mmol/L — ABNORMAL HIGH (ref 96–106)
GFR calc Af Amer: 94 mL/min/{1.73_m2} (ref >59)
GFR calc non Af Amer: 81 mL/min/{1.73_m2} (ref >59)
GLUCOSE: 91 mg/dL (ref 65–99)
Globulin, Total: 3.1 g/dL (ref 1.5–4.5)
Potassium: 4.2 mmol/L (ref 3.5–5.2)
Sodium: 138 mmol/L (ref 134–144)
Total Protein: 7.6 g/dL (ref 6.0–8.5)

## 2018-02-19 LAB — LIPID PANEL
Chol/HDL Ratio: 4.5 ratio — ABNORMAL HIGH (ref 0.0–4.4)
Cholesterol, Total: 226 mg/dL — ABNORMAL HIGH (ref 100–199)
HDL: 50 mg/dL (ref >39)
LDL CALC: 147 mg/dL — AB (ref 0–99)
Triglycerides: 143 mg/dL (ref 0–149)
VLDL Cholesterol Cal: 29 mg/dL (ref 5–40)

## 2018-02-19 LAB — HEMOGLOBIN A1C
Est. average glucose Bld gHb Est-mCnc: 91 mg/dL
Hgb A1c MFr Bld: 4.8 % (ref 4.8–5.6)

## 2018-02-19 LAB — PAP IG, CT-NG, RFX HPV ASCU
CHLAMYDIA, NUC. ACID AMP: NEGATIVE
Gonococcus by Nucleic Acid Amp: NEGATIVE

## 2018-02-19 LAB — VITAMIN D 25 HYDROXY (VIT D DEFICIENCY, FRACTURES)

## 2018-02-19 LAB — TSH: TSH: 0.847 u[IU]/mL (ref 0.450–4.500)

## 2018-02-20 ENCOUNTER — Telehealth: Payer: Self-pay | Admitting: Family Medicine

## 2018-02-20 NOTE — Telephone Encounter (Signed)
Per Dr. Alver FisherShaw;'s note, I called and spoke with pt regarding a nurse visit for labs and then a F/U OV appt. I was able to schedule an appt for the pt on 11/25 at 8:00 (overbooked per Clelia CroftShaw) and she will come in on 11/21 after her other sppt and have the labs drawn. I advised of time, building and late policy.

## 2018-02-21 ENCOUNTER — Ambulatory Visit (INDEPENDENT_AMBULATORY_CARE_PROVIDER_SITE_OTHER): Payer: 59 | Admitting: Family Medicine

## 2018-02-21 ENCOUNTER — Ambulatory Visit
Admission: RE | Admit: 2018-02-21 | Discharge: 2018-02-21 | Disposition: A | Discharge status: Home / Self Care | Payer: 59 | Source: Ambulatory Visit | Attending: Family Medicine | Admitting: Family Medicine

## 2018-02-21 DIAGNOSIS — Z6837 Body mass index (BMI) 37.0-37.9, adult: Principal | ICD-10-CM

## 2018-02-21 DIAGNOSIS — R7989 Other specified abnormal findings of blood chemistry: Secondary | ICD-10-CM

## 2018-02-21 DIAGNOSIS — E66812 Obesity, class 2: Secondary | ICD-10-CM

## 2018-02-21 DIAGNOSIS — E6609 Other obesity due to excess calories: Secondary | ICD-10-CM

## 2018-02-21 DIAGNOSIS — R945 Abnormal results of liver function studies: Secondary | ICD-10-CM | POA: Diagnosis not present

## 2018-02-21 DIAGNOSIS — R7401 Elevation of levels of liver transaminase levels: Secondary | ICD-10-CM

## 2018-02-21 DIAGNOSIS — E878 Other disorders of electrolyte and fluid balance, not elsewhere classified: Secondary | ICD-10-CM | POA: Diagnosis not present

## 2018-02-21 DIAGNOSIS — E782 Mixed hyperlipidemia: Secondary | ICD-10-CM

## 2018-02-21 DIAGNOSIS — R740 Nonspecific elevation of levels of transaminase and lactic acid dehydrogenase [LDH]: Secondary | ICD-10-CM

## 2018-02-22 LAB — BASIC METABOLIC PANEL
BUN/Creatinine Ratio: 9 (ref 9–23)
BUN: 9 mg/dL (ref 6–24)
CALCIUM: 8.8 mg/dL (ref 8.7–10.2)
CO2: 20 mmol/L (ref 20–29)
CREATININE: 0.95 mg/dL (ref 0.57–1.00)
Chloride: 107 mmol/L — ABNORMAL HIGH (ref 96–106)
GFR calc Af Amer: 84 mL/min/{1.73_m2} (ref >59)
GFR, EST NON AFRICAN AMERICAN: 73 mL/min/{1.73_m2} (ref >59)
GLUCOSE: 89 mg/dL (ref 65–99)
POTASSIUM: 4.1 mmol/L (ref 3.5–5.2)
Sodium: 140 mmol/L (ref 134–144)

## 2018-02-22 LAB — HEPATIC FUNCTION PANEL
ALT: 50 IU/L — ABNORMAL HIGH (ref 0–32)
AST: 31 IU/L (ref 0–40)
Albumin: 4.3 g/dL (ref 3.5–5.5)
Alkaline Phosphatase: 51 IU/L (ref 39–117)
BILIRUBIN TOTAL: 0.6 mg/dL (ref 0.0–1.2)
Bilirubin, Direct: 0.14 mg/dL (ref 0.00–0.40)
TOTAL PROTEIN: 6.9 g/dL (ref 6.0–8.5)

## 2018-02-22 LAB — HEPATITIS B SURFACE ANTIGEN: Hepatitis B Surface Ag: NEGATIVE

## 2018-02-22 LAB — HEPATITIS B SURFACE ANTIBODY, QUANTITATIVE

## 2018-02-22 LAB — HEPATITIS A ANTIBODY, IGM: HEP A IGM: NEGATIVE

## 2018-02-22 LAB — HEPATITIS A ANTIBODY, TOTAL: HEP A TOTAL AB: NEGATIVE

## 2018-02-25 ENCOUNTER — Encounter: Payer: Self-pay | Admitting: Family Medicine

## 2018-02-25 ENCOUNTER — Ambulatory Visit: Payer: 59 | Admitting: Family Medicine

## 2018-02-25 ENCOUNTER — Other Ambulatory Visit: Payer: Self-pay

## 2018-02-25 VITALS — BP 135/89 | HR 78 | Temp 98.2°F | Resp 16 | Ht 63.0 in | Wt 202.0 lb

## 2018-02-25 DIAGNOSIS — R740 Nonspecific elevation of levels of transaminase and lactic acid dehydrogenase [LDH]: Secondary | ICD-10-CM

## 2018-02-25 DIAGNOSIS — R7401 Elevation of levels of liver transaminase levels: Secondary | ICD-10-CM

## 2018-02-25 DIAGNOSIS — Z789 Other specified health status: Secondary | ICD-10-CM

## 2018-02-25 DIAGNOSIS — Z283 Underimmunization status: Secondary | ICD-10-CM | POA: Diagnosis not present

## 2018-02-25 DIAGNOSIS — B199 Unspecified viral hepatitis without hepatic coma: Secondary | ICD-10-CM | POA: Diagnosis not present

## 2018-02-25 DIAGNOSIS — Z2839 Other underimmunization status: Secondary | ICD-10-CM | POA: Insufficient documentation

## 2018-02-25 DIAGNOSIS — J0110 Acute frontal sinusitis, unspecified: Secondary | ICD-10-CM

## 2018-02-25 MED ORDER — HYDROCOD POLST-CPM POLST ER 10-8 MG/5ML PO SUER: 5.0000 mL | Freq: Two times a day (BID) | ORAL | 0 refills | Status: DC | PRN

## 2018-02-25 MED ORDER — AZITHROMYCIN 250 MG PO TABS: ORAL_TABLET | ORAL | 0 refills | Status: DC

## 2018-02-25 NOTE — Progress Notes (Signed)
Lab only visit 

## 2018-02-25 NOTE — Progress Notes (Signed)
Subjective:    Patient: Angela Farrell  DOB: 1976-01-25; 42 y.o.   MRN: [id number]  Chief Complaint  Patient presents with  . Cough    with nasal congestion and chest x 5 days   . Lab Review    follow-up     HPI Had 6d trip prior to our CPE where her LFTs were elevated. She had taken some tylenol pm 2 tabs once because she was achy.  Did drink a little on the cruise but was pretty minimal.  She played shuffle board and got sick n/v about 1 week and ended up achy and sore the rest of the time she was on the cruise.  No rash (other than one on breast for over a year).  I'll x 4d - chocking and drowning on phlegm. Head huring her very bad.  Was def sinus pressure for first 2d but was causing migraine yet - rizatriptan eased up a little and now behind eyes mainly. No tooth pain, not one side worse as drains back and forth.  Getting hot overnight with sweats so she thinks fevers.  Cough and congestion red last night and now brown.  Using mucinex sinus and alkaseltzer effervesense. And elderberry syrup, lots of tea.  No N/v/,abd pain, urine and bowels normal.  She usually has so much HA pain, muscle spasm, and back pain.  Medical History Past Medical History:  Diagnosis Date  . Allergy   . Anemia   . Anxiety   . Arthritis   . Chronic headache disorder    s/p Meningitis x 2, tension- type plus allergic/sinus problems  . History of abnormal Pap smear   . Meningitis, viral At age 27 and at age 15  . Nasal fracture 2011  . Other and unspecified ovarian cysts 2011- pelvic CT   GYN- Dr. Chevis Pretty  . Vision abnormalities    Past Surgical History:  Procedure Laterality Date  . BACK SURGERY  02/09/15   L5 - S1 diskectomy  . FOOT SURGERY  s/p 2 procedures  . LAPAROSCOPY FOR ECTOPIC PREGNANCY  1998   Fallopian tube removed  . MOUTH SURGERY    . spinal injection  03/10/15  . SPINE SURGERY    . UTERINE ARTERY EMBOLIZATION     Current Outpatient Medications on File Prior to Visit    Medication Sig Dispense Refill  . B Complex-C (SUPER B COMPLEX/VITAMIN C PO) Take by mouth.    . Calcium-Magnesium-Vitamin D (CALCIUM 1200+D3 PO) Take 1,200 Units by mouth.    . Cholecalciferol (D3-1000 PO) Take 2,000 Units by mouth.    . Coenzyme Q10 (CO Q 10 PO) Take 50 mg by mouth daily.    Dorise Hiss (AIMOVIG) 70 MG/ML SOAJ Inject 70 mg into the skin every 30 (thirty) days. 3 pen 4  . GLUCOSAMINE-CHONDROITIN PO Take 1 tablet by mouth daily.    . Lutein 40 MG CAPS Take 40 mg by mouth daily.    . Magnesium 500 MG CAPS Take 500 mg by mouth 4 (four) times daily.    Marland Kitchen omeprazole (PRILOSEC) 40 MG capsule TAKE 1 CAPSULE BY MOUTH TWICE A DAY as needed BEFORE MEALS BY 30 MINTUES 60 capsule 0  . Potassium Gluconate 595 MG CAPS Take 595 mg by mouth daily.    . propranolol (INDERAL) 40 MG tablet Take 1 tablet (40 mg total) by mouth 2 (two) times daily. 60 tablet 12  . rizatriptan (MAXALT-MLT) 10 MG disintegrating tablet Take 1 tablet (10 mg total)  by mouth as needed for migraine. May repeat in 2 hours if needed 9 tablet 11  . topiramate (TOPAMAX) 100 MG tablet Take 1 tablet (100 mg total) by mouth 2 (two) times daily. 60 tablet 12  . valACYclovir (VALTREX) 500 MG tablet Take 1 tablet (500 mg total) by mouth 2 (two) times daily. 180 tablet 4  . vitamin A 8000 UNIT capsule Take 8,000 Units by mouth daily.    . vitamin C (ASCORBIC ACID) 500 MG tablet Take 500 mg by mouth daily.     No current facility-administered medications on file prior to visit.    No Known Allergies Family History  Problem Relation Age of Onset  . Migraines Father        cluster headache  . Hyperlipidemia Father   . Hypertension Father   . Cancer Mother 2       Breast  . Stroke Maternal Grandmother   . Diabetes Paternal Grandmother   . Diabetes Unknown        paternal relaitve  . Seizures Unknown        maternal relative  . Rheum arthritis Unknown        maternal relatives   Social History   Socioeconomic  History  . Marital status: Married    Spouse name: Jesusita Oka  . Number of children: Not on file  . Years of education: Not on file  . Highest education level: Not on file  Occupational History  . Occupation: Chief Financial Officer: [employer]  Social Needs  . Financial resource strain: Not on file  . Food insecurity:    Worry: Not on file    Inability: Not on file  . Transportation needs:    Medical: Not on file    Non-medical: Not on file  Tobacco Use  . Smoking status: Former Smoker    Years: 20.00    Types: Cigarettes  . Smokeless tobacco: Never Used  Substance and Sexual Activity  . Alcohol use: Yes    Alcohol/week: 1.0 standard drinks    Types: 1 Standard drinks or equivalent per week  . Drug use: Yes    Types: Marijuana    Comment: per pt "few times a year"  . Sexual activity: Yes    Partners: Male    Birth control/protection: Implant    Comment: sexually active w/ female husband  Lifestyle  . Physical activity:    Days per week: 0 days    Minutes per session: Not on file  . Stress: Not on file  Relationships  . Social connections:    Talks on phone: Not on file    Gets together: Not on file    Attends religious service: Not on file    Active member of club or organization: Not on file    Attends meetings of clubs or organizations: Not on file    Relationship status: Not on file  Other Topics Concern  . Not on file  Social History Narrative   Drinks about 12-16oz of coffee a day, occasionally drinks a tea.   [employer] on computer as Systems developer.    Depression screen [facility] 2/9 02/25/2018 02/15/2018 05/01/2017 01/06/2017 01/03/2017  Decreased Interest 0 0 0 0 0  Down, Depressed, Hopeless 0 0 0 0 0  PHQ - 2 Score 0 0 0 0 0  Altered sleeping - - - - -  Tired, decreased energy - - - - -  Change in appetite - - - - -  Feeling bad or failure about yourself  - - - - -  Trouble concentrating - - - - -  Moving slowly or fidgety/restless - - - - -  Suicidal thoughts - - - -  -  PHQ-9 Score - - - - -    ROS As noted in HPI  Objective:  BP 135/89   Pulse 78   Temp 98.2 F (36.8 C) (Oral)   Resp 16   Ht 5\' 3"  (1.6 m)   Wt 202 lb (91.6 kg)   SpO2 98%   BMI 35.78 kg/m  Physical Exam  Constitutional: She is oriented to person, place, and time. She appears well-developed and well-nourished. She appears lethargic. She appears ill. No distress.  HENT:  Head: Normocephalic and atraumatic.  Right Ear: External ear and ear canal normal. Tympanic membrane is erythematous.  Left Ear: External ear and ear canal normal. Tympanic membrane is erythematous.  Nose: Mucosal edema and rhinorrhea present. Right sinus exhibits frontal sinus tenderness. Left sinus exhibits frontal sinus tenderness.  Mouth/Throat: Uvula is midline and mucous membranes are normal. No oropharyngeal exudate, posterior oropharyngeal edema, posterior oropharyngeal erythema or tonsillar abscesses.  Eyes: Conjunctivae are normal. Right eye exhibits no discharge. Left eye exhibits no discharge. No scleral icterus.  Neck: Normal range of motion. Neck supple.  Cardiovascular: Normal rate, regular rhythm, normal heart sounds and intact distal pulses.  Pulmonary/Chest: Effort normal and breath sounds normal.  Lymphadenopathy:       Head (right side): Submandibular adenopathy present. No preauricular and no posterior auricular adenopathy present.       Head (left side): Submandibular adenopathy present. No preauricular and no posterior auricular adenopathy present.    She has no cervical adenopathy.       Right: No supraclavicular adenopathy present.       Left: No supraclavicular adenopathy present.  Neurological: She is oriented to person, place, and time. She appears lethargic.  Skin: Skin is warm and dry. She is not diaphoretic. No erythema.  Psychiatric: She has a normal mood and affect. Her behavior is normal.    POC TESTING No visits with results within 3 Day(s) from this visit.  Latest known  visit with results is:  Office Visit on 02/21/2018  Component Date Value Ref Range Status  . Hep A Total Ab 02/21/2018 Negative  Negative Final  . Hep A IgM 02/21/2018 Negative  Negative Final  . Hepatitis B Surf Ab Quant 02/21/2018 <3.1* Immunity>9.9 mIU/mL Final   Comment:   Status of Immunity                     Anti-HBs Level   ------------------                     -------------- Inconsistent with Immunity                   0.0 - 9.9 Consistent with Immunity                          >9.9   . Hepatitis B Surface Ag 02/21/2018 Negative  Negative Final  . Glucose 02/21/2018 89  65 - 99 mg/dL Final  . BUN 16/10/960411/21/2019 9  6 - 24 mg/dL Final  . Creatinine, Ser 02/21/2018 0.95  0.57 - 1.00 mg/dL Final  . GFR calc non Af Amer 02/21/2018 73  >59 mL/min/1.73 Final  . GFR calc Af Amer 02/21/2018 84  >  59 mL/min/1.73 Final  . BUN/Creatinine Ratio 02/21/2018 9  9 - 23 Final  . Sodium 02/21/2018 140  134 - 144 mmol/L Final  . Potassium 02/21/2018 4.1  3.5 - 5.2 mmol/L Final  . Chloride 02/21/2018 107* 96 - 106 mmol/L Final  . CO2 02/21/2018 20  20 - 29 mmol/L Final  . Calcium 02/21/2018 8.8  8.7 - 10.2 mg/dL Final  . Total Protein 02/21/2018 6.9  6.0 - 8.5 g/dL Final  . Albumin 16/01/9603 4.3  3.5 - 5.5 g/dL Final  . Bilirubin Total 02/21/2018 0.6  0.0 - 1.2 mg/dL Final  . Bilirubin, Direct 02/21/2018 0.14  0.00 - 0.40 mg/dL Final  . Alkaline Phosphatase 02/21/2018 51  39 - 117 IU/L Final  . AST 02/21/2018 31  0 - 40 IU/L Final  . ALT 02/21/2018 50* 0 - 32 IU/L Final    US Abdomen Limited Ruq  Result Date: 02/21/2018 CLINICAL DATA:  Elevated liver function tests. Hyperlipidemia. Class 2 obesity. EXAM: ULTRASOUND ABDOMEN LIMITED RIGHT UPPER QUADRANT COMPARISON:  01/09/2017 FINDINGS: Gallbladder: No gallstones or wall thickening visualized. No sonographic Murphy sign noted by sonographer. Common bile duct: Diameter: 2 mm, within normal limits. Liver: No focal lesion identified. Within normal  limits in parenchymal echogenicity. Portal vein is patent on color Doppler imaging with normal direction of blood flow towards the liver. IMPRESSION: Negative.  No hepatobiliary abnormality identified. Electronically Signed   By: Myles Rosenthal M.D.   On: 02/21/2018 08:38    Assessment & Plan:   1. Acute non-recurrent frontal sinusitis   2. Transaminitis - suspect lft elev might have been due to the viral GI infxn she recently caught on a cruise - improving and [place] reassuring.  3. Hepatitis in viral disease   4. Hepatitis A vaccination not up to date - not immune, given first dose today  5. Hepatitis B vaccination not up to date - not immune, given first dose today    Resolving - sounds like she likely came down with a viral infxn on the cruise that was short lived and resolving by the time we incidentally checked her labs during her CPE - doubt it could be due to her missing her supp and using minimal acetaminophen, etoh, fatty foods.  Nml [place] reassuring However, did find that non-immune to hepatitis A and B so rec RTC when feeling better.   Patient will continue on current chronic medications other than changes noted above, so ok to refill when needed.   See after visit summary for patient specific instructions.  Orders Placed This Encounter  Procedures  . Hepatitis A vaccine adult IM    Pt will RTC in sev wks for nurse only visit for 1st hep A vaccine.    Standing Status:   Future    Standing Expiration Date:   08/26/2019  . Hepatitis B vaccine adult IM    Pt will RTC in sev wks for nurse only visit for 1st hep B vaccine.    Standing Status:   Future    Standing Expiration Date:   08/26/2019  . Hepatic Function Panel    Standing Status:   Future    Standing Expiration Date:   02/26/2019    Meds ordered this encounter  Medications  . azithromycin (ZITHROMAX) 250 MG tablet    Sig: Take 2 tabs PO x 1 dose, then 1 tab PO QD x 4 days    Dispense:  6 tablet    Refill:  0  .  chlorpheniramine-HYDROcodone (TUSSIONEX PENNKINETIC ER) 10-8 MG/5ML SUER    Sig: Take 5 mLs by mouth every 12 (twelve) hours as needed.    Dispense:  120 mL    Refill:  0    Patient verbalized to me that they understand the following: diagnosis, what is being done for them, what to expect and what should be done at home.  Their questions have been answered. They understand that I am unable to predict every possible medication interaction or adverse outcome and that if any unexpected symptoms arise, they should contact us and their pharmacist, as well as never hesitate to seek urgent/emergent care at [facility] or ER if they think it might be warranted.    [name], MD, MPH Primary Care at [facility] [address] [place], [place]  [phone] Office phone  [phone] Office fax  02/25/18 8:12 AM

## 2018-02-25 NOTE — Patient Instructions (Addendum)
In 1 to 2 weeks, whenever you are feeling better, please drop by the office for a nurse only visit.  I would like you to get your liver function tests repeated one last time.  Please let the front desk and nurse know that you need your labs drawn as orders are in for this.  However I do think it would be safe for you to go ahead with your initial hepatitis A and B vaccines at that time as well without waiting for the final round of lab results.  You will return to clinic in 2 months for your second hepatitis B shot and in 6 months from your initial vaccines, which would be 4 months from your second hep B vaccine, for your final hepatitis vaccinations of the third hepatitis B and second hepatitis A.  These can all be done during nurse visits if you would like.    If you have lab work done today you will be contacted with your lab results within the next 2 weeks.  If you have not heard from us then please contact us. The fastest way to get your results is to register for My Chart.   IF you received an x-ray today, you will receive an invoice from [facility]. Please contact [facility] at [phone] with questions or concerns regarding your invoice.   IF you received labwork today, you will receive an invoice from [facility]. Please contact [facility] at [phone] with questions or concerns regarding your invoice.   Our billing staff will not be able to assist you with questions regarding bills from these companies.  You will be contacted with the lab results as soon as they are available. The fastest way to get your results is to activate your My Chart account. Instructions are located on the last page of this paperwork. If you have not heard from us regarding the results in 2 weeks, please contact this office.     Sinusitis, Adult Sinusitis is soreness and inflammation of your sinuses. Sinuses are hollow spaces in the bones around your face. Your sinuses are located:  Around  your eyes.  In the middle of your forehead.  Behind your nose.  In your cheekbones.  Your sinuses and nasal passages are lined with a stringy fluid (mucus). Mucus normally drains out of your sinuses. When your nasal tissues become inflamed or swollen, the mucus can become trapped or blocked so air cannot flow through your sinuses. This allows bacteria, viruses, and funguses to grow, which leads to infection. Sinusitis can develop quickly and last for 7?10 days (acute) or for more than 12 weeks (chronic). Sinusitis often develops after a cold. What are the causes? This condition is caused by anything that creates swelling in the sinuses or stops mucus from draining, including:  Allergies.  Asthma.  Bacterial or viral infection.  Abnormally shaped bones between the nasal passages.  Nasal growths that contain mucus (nasal polyps).  Narrow sinus openings.  Pollutants, such as chemicals or irritants in the air.  A foreign object stuck in the nose.  A fungal infection. This is rare.  What increases the risk? The following factors may make you more likely to develop this condition:  Having allergies or asthma.  Having had a recent cold or respiratory tract infection.  Having structural deformities or blockages in your nose or sinuses.  Having a weak immune system.  Doing a lot of swimming or diving.  Overusing nasal sprays.  Smoking.  What are the signs  or symptoms? The main symptoms of this condition are pain and a feeling of pressure around the affected sinuses. Other symptoms include:  Upper toothache.  Earache.  Headache.  Bad breath.  Decreased sense of smell and taste.  A cough that may get worse at night.  Fatigue.  Fever.  Thick drainage from your nose. The drainage is often green and it may contain pus (purulent).  Stuffy nose or congestion.  Postnasal drip. This is when extra mucus collects in the throat or back of the nose.  Swelling and  warmth over the affected sinuses.  Sore throat.  Sensitivity to light.  How is this diagnosed? This condition is diagnosed based on symptoms, a medical history, and a physical exam. To find out if your condition is acute or chronic, your health care provider may:  Look in your nose for signs of nasal polyps.  Tap over the affected sinus to check for signs of infection.  View the inside of your sinuses using an imaging device that has a light attached (endoscope).  If your health care provider suspects that you have chronic sinusitis, you may also:  Be tested for allergies.  Have a sample of mucus taken from your nose (nasal culture) and checked for bacteria.  Have a mucus sample examined to see if your sinusitis is related to an allergy.  If your sinusitis does not respond to treatment and it lasts longer than 8 weeks, you may have an MRI or CT scan to check your sinuses. These scans also help to determine how severe your infection is. In rare cases, a bone biopsy may be done to rule out more serious types of fungal sinus disease. How is this treated? Treatment for sinusitis depends on the cause and whether your condition is chronic or acute. If a virus is causing your sinusitis, your symptoms will go away on their own within 10 days. You may be given medicines to relieve your symptoms, including:  Topical nasal decongestants. They shrink swollen nasal passages and let mucus drain from your sinuses.  Antihistamines. These drugs block inflammation that is triggered by allergies. This can help to ease swelling in your nose and sinuses.  Topical nasal corticosteroids. These are nasal sprays that ease inflammation and swelling in your nose and sinuses.  Nasal saline washes. These rinses can help to get rid of thick mucus in your nose.  If your condition is caused by bacteria, you will be given an antibiotic medicine. If your condition is caused by a fungus, you will be given an  antifungal medicine. Surgery may be needed to correct underlying conditions, such as narrow nasal passages. Surgery may also be needed to remove polyps. Follow these instructions at home: Medicines  Take, use, or apply over-the-counter and prescription medicines only as told by your health care provider. These may include nasal sprays.  If you were prescribed an antibiotic medicine, take it as told by your health care provider. Do not stop taking the antibiotic even if you start to feel better. Hydrate and Humidify  Drink enough water to keep your urine clear or pale yellow. Staying hydrated will help to thin your mucus.  Use a cool mist humidifier to keep the humidity level in your home above 50%.  Inhale steam for 10-15 minutes, 3-4 times a day or as told by your health care provider. You can do this in the bathroom while a hot shower is running.  Limit your exposure to cool or dry air. Rest  Rest as much as possible.  Sleep with your head raised (elevated).  Make sure to get enough sleep each night. General instructions  Apply a warm, moist washcloth to your face 3-4 times a day or as told by your health care provider. This will help with discomfort.  Wash your hands often with soap and water to reduce your exposure to viruses and other germs. If soap and water are not available, use hand sanitizer.  Do not smoke. Avoid being around people who are smoking (secondhand smoke).  Keep all follow-up visits as told by your health care provider. This is important. Contact a health care provider if:  You have a fever.  Your symptoms get worse.  Your symptoms do not improve within 10 days. Get help right away if:  You have a severe headache.  You have persistent vomiting.  You have pain or swelling around your face or eyes.  You have vision problems.  You develop confusion.  Your neck is stiff.  You have trouble breathing. This information is not intended to replace  advice given to you by your health care provider. Make sure you discuss any questions you have with your health care provider. Document Released: 03/20/2005 Document Revised: 11/14/2015 Document Reviewed: 01/13/2015 Elsevier Interactive Patient Education  [employer].

## 2018-03-09 ENCOUNTER — Other Ambulatory Visit: Payer: Self-pay | Admitting: Family Medicine

## 2018-03-14 DIAGNOSIS — H2513 Age-related nuclear cataract, bilateral: Secondary | ICD-10-CM | POA: Diagnosis not present

## 2018-03-14 DIAGNOSIS — H47323 Drusen of optic disc, bilateral: Secondary | ICD-10-CM | POA: Diagnosis not present

## 2018-03-29 ENCOUNTER — Encounter: Payer: Self-pay | Admitting: Family Medicine

## 2018-03-29 ENCOUNTER — Telehealth: Payer: Self-pay | Admitting: *Deleted

## 2018-03-29 NOTE — Telephone Encounter (Signed)
CMM KEY # [id number]  aimovig PA inititated to [facility].

## 2018-04-01 NOTE — Telephone Encounter (Signed)
Per CMM, Aimovig is denied.

## 2018-04-09 ENCOUNTER — Telehealth: Payer: Self-pay | Admitting: *Deleted

## 2018-04-09 NOTE — Telephone Encounter (Signed)
I called pt after doing PA for aimovig, denied.  She has been on the aimovig for 3 months.  She stated not really sure it is working.  Will continue to take for 5-6 months.  I asked her if her daily headaches were migraines as well, not severe as the migraines.  She asked her husband and he stated that yes she had 8-9 migraines per month.  I will redo PA and see if approval.  She is to call back if concerns,questions.

## 2018-04-16 NOTE — Telephone Encounter (Signed)
Redid PA for aimovig today with update on the migraines more then 8 per month, and daily headaches.  KEY [id number].  Initiated 04-16-18. Awaiting approval.

## 2018-04-22 NOTE — Telephone Encounter (Addendum)
[facility] has a denied request on fil for aimovig. Can do appeal, although pt states this has not made a difference.  Do you want to try another injectable?

## 2018-04-22 NOTE — Telephone Encounter (Signed)
Can try emgality or ajovy. -VRP

## 2018-04-23 NOTE — Telephone Encounter (Signed)
Spoke to pt and relayed that 2 other injectable options.  Emgality or Ajovy. She will look at these online and let us know.  She has Community education officer.

## 2018-04-30 MED ORDER — GALCANEZUMAB-GNLM 120 MG/ML ~~LOC~~ SOAJ: 240.0000 mg | Freq: Once | SUBCUTANEOUS | 5 refills | Status: AC

## 2018-04-30 NOTE — Telephone Encounter (Signed)
Order placed

## 2018-04-30 NOTE — Telephone Encounter (Signed)
Go ahead with loading dose. -VRP

## 2018-04-30 NOTE — Telephone Encounter (Signed)
Spoke to pt and she would like to try emgality.  Did you want to do loading dose (2 syringes) or single?  Explained about getting on line for savings card if [employer].  She has bad migraine today.  Is working thru this but is anxious to try and see if makes a difference.

## 2018-04-30 NOTE — Addendum Note (Signed)
Addended by: Guy Begin on: 04/30/2018 02:39 PM   Modules accepted: Orders

## 2018-04-30 NOTE — Addendum Note (Signed)
Addended by: Guy Begin on: 04/30/2018 05:19 PM   Modules accepted: Orders

## 2018-04-30 NOTE — Telephone Encounter (Signed)
Pt has called RN Sandy back, she is asking for a call back °

## 2018-05-02 NOTE — Telephone Encounter (Addendum)
Initiated PA for [employer]. CMM Key #  [id number]. (redone from 1-30).  Determination pending.

## 2018-05-08 NOTE — Telephone Encounter (Signed)
PA approved PA [id number]. emagality 120mg /ml approved they 08-04-2018. [phone].  [facility].  Fax confirmation received for [phone] [facility].

## 2018-05-08 NOTE — Telephone Encounter (Signed)
LMVM for pt that PA approved for emgality.  She is to call back if questions.

## 2018-06-20 DIAGNOSIS — G43909 Migraine, unspecified, not intractable, without status migrainosus: Secondary | ICD-10-CM | POA: Insufficient documentation

## 2018-06-20 DIAGNOSIS — Z01419 Encounter for gynecological examination (general) (routine) without abnormal findings: Secondary | ICD-10-CM | POA: Diagnosis not present

## 2018-06-20 DIAGNOSIS — Z6836 Body mass index (BMI) 36.0-36.9, adult: Secondary | ICD-10-CM | POA: Diagnosis not present

## 2018-06-20 DIAGNOSIS — B009 Herpesviral infection, unspecified: Secondary | ICD-10-CM | POA: Insufficient documentation

## 2018-07-08 ENCOUNTER — Telehealth: Payer: Self-pay | Admitting: *Deleted

## 2018-07-08 NOTE — Telephone Encounter (Signed)
Received fax from [facility] to request renewal of emgality.  120mg /ml.  KEY [id number]. (from previous approval until 08-04-18).

## 2018-07-11 NOTE — Telephone Encounter (Signed)
Spoke to pt and she is doing better on emgality.  Less migraines and intensity.  Sent to plan, awaiting determination.

## 2018-07-16 NOTE — Telephone Encounter (Signed)
Received approval for emgality [id number] Emgality 120mg /ml approved through 08-05-19 [phone].

## 2018-08-12 DIAGNOSIS — L918 Other hypertrophic disorders of the skin: Secondary | ICD-10-CM | POA: Insufficient documentation

## 2018-08-12 DIAGNOSIS — B360 Pityriasis versicolor: Secondary | ICD-10-CM | POA: Diagnosis not present

## 2018-10-18 ENCOUNTER — Telehealth: Payer: Self-pay | Admitting: Family Medicine

## 2018-10-18 ENCOUNTER — Other Ambulatory Visit: Payer: Self-pay

## 2018-10-18 MED ORDER — VALACYCLOVIR HCL 500 MG PO TABS: 500.0000 mg | ORAL_TABLET | Freq: Two times a day (BID) | ORAL | 0 refills | Status: DC

## 2018-10-18 NOTE — Telephone Encounter (Signed)
Rx sent until pt can get appointment.

## 2018-10-18 NOTE — Telephone Encounter (Signed)
Please schedule appointment.

## 2018-10-18 NOTE — Telephone Encounter (Signed)
Relation to pt: self Call back number: [phone]  Pharmacy: [id number] - [place], [place] [phone] (Phone) [phone] (Fax)     Reason for call:  Patient requesting valACYclovir (VALTREX) 500 MG tablet

## 2018-10-18 NOTE — Telephone Encounter (Signed)
Patient requesting Transfer Of Care appointment, please advise

## 2018-10-22 ENCOUNTER — Encounter: Payer: Self-pay | Admitting: Emergency Medicine

## 2018-10-22 ENCOUNTER — Other Ambulatory Visit: Payer: Self-pay

## 2018-10-22 ENCOUNTER — Telehealth (INDEPENDENT_AMBULATORY_CARE_PROVIDER_SITE_OTHER): Payer: 59 | Admitting: Emergency Medicine

## 2018-10-22 VITALS — BP 116/75 | HR 74 | Temp 97.5°F | Ht 63.0 in | Wt 196.0 lb

## 2018-10-22 DIAGNOSIS — Z7689 Persons encountering health services in other specified circumstances: Secondary | ICD-10-CM | POA: Diagnosis not present

## 2018-10-22 DIAGNOSIS — Z760 Encounter for issue of repeat prescription: Secondary | ICD-10-CM | POA: Diagnosis not present

## 2018-10-22 DIAGNOSIS — Z8661 Personal history of infections of the central nervous system: Secondary | ICD-10-CM

## 2018-10-22 DIAGNOSIS — Z8619 Personal history of other infectious and parasitic diseases: Secondary | ICD-10-CM

## 2018-10-22 MED ORDER — VALACYCLOVIR HCL 500 MG PO TABS: 500.0000 mg | ORAL_TABLET | Freq: Two times a day (BID) | ORAL | 3 refills | Status: DC

## 2018-10-22 NOTE — Progress Notes (Signed)
CC: Patient states that she is establishing care with Dr. Mitchel Honour, previous patient of Dr. Brigitte Pulse. She states she would like to discuss her valtrex medication.

## 2018-10-22 NOTE — Progress Notes (Signed)
Telemedicine Encounter- SOAP NOTE Established Patient  This video telephone encounter with Doxy me was conducted with the patient's (or proxy's) verbal consent via video audio telecommunications: yes/no: Yes Patient was instructed to have this encounter in a suitably private space; and to only have persons present to whom they give permission to participate. In addition, patient identity was confirmed by use of name plus two identifiers (DOB and address).  I discussed the limitations, risks, security and privacy concerns of performing an evaluation and management service by telephone and the availability of in person appointments. I also discussed with the patient that there may be a patient responsible charge related to this service. The patient expressed understanding and agreed to proceed.  I spent a total of TIME; 0 MIN TO 60 MIN: 15 minutes talking with the patient or their proxy.  No chief complaint on file. Medication refill and to establish care  Subjective   Angela Farrell is a 43 y.o. female established patient.  Used to see Dr. Clelia CroftShaw.  Wants to establish care with me.  Telephone visit today to establish care and discuss Valtrex use.  Has a history of viral meningitis in 2010 and has been taking Valtrex twice a day since.  Patient states it prevents her from getting headaches and neck symptoms such as stiffness.  Also has a history of migraine headaches.  Saw infectious disease doctor while in the hospital but has not seen one since discharge.  No other complaints or medical concerns.  HPI   Patient Active Problem List   Diagnosis Date Noted  . Chronic frontal sinusitis 03/05/2017  . History of meningitis 11/03/2014  . Depression 11/03/2014  . Anxiety disorder 01/18/2012  . Chronic anemia 10/14/2011  . Chronic headache disorder 04/18/2011  . Obesity 04/18/2011  . Chronic rhinitis 04/18/2011    Class: Chronic    Past Medical History:  Diagnosis Date  . Allergy   .  Anemia   . Anxiety   . Arthritis   . Chronic headache disorder    s/p Meningitis x 2, tension- type plus allergic/sinus problems  . History of abnormal Pap smear   . Meningitis, viral At age 43 and at age 43  . Nasal fracture 2011  . Other and unspecified ovarian cysts 2011- pelvic CT   GYN- Dr. Chevis PrettyMezer  . Vision abnormalities     Current Outpatient Medications  Medication Sig Dispense Refill  . B Complex-C (SUPER B COMPLEX/VITAMIN C PO) Take by mouth.    . Calcium-Magnesium-Vitamin D (CALCIUM 1200+D3 PO) Take 1,200 Units by mouth.    . Cholecalciferol (D3-1000 PO) Take 2,000 Units by mouth.    . Coenzyme Q10 (CO Q 10 PO) Take 50 mg by mouth daily.    Dorise Hiss. Erenumab-aooe (AIMOVIG) 70 MG/ML SOAJ Inject 70 mg into the skin every 30 (thirty) days. 3 pen 4  . GLUCOSAMINE-CHONDROITIN PO Take 1 tablet by mouth daily.    . Lutein 40 MG CAPS Take 40 mg by mouth daily.    . Magnesium 500 MG CAPS Take 500 mg by mouth 4 (four) times daily.    Marland Kitchen. omeprazole (PRILOSEC) 40 MG capsule TAKE 1 CAPSULE BY MOUTH TWICE A DAY AS NEEDED BEFORE MEALS BY 30 MINTUES 180 capsule 3  . Potassium Gluconate 595 MG CAPS Take 595 mg by mouth daily.    . propranolol (INDERAL) 40 MG tablet Take 1 tablet (40 mg total) by mouth 2 (two) times daily. 60 tablet 12  . rizatriptan (  MAXALT-MLT) 10 MG disintegrating tablet Take 1 tablet (10 mg total) by mouth as needed for migraine. May repeat in 2 hours if needed 9 tablet 11  . topiramate (TOPAMAX) 100 MG tablet Take 1 tablet (100 mg total) by mouth 2 (two) times daily. 60 tablet 12  . valACYclovir (VALTREX) 500 MG tablet Take 1 tablet (500 mg total) by mouth 2 (two) times daily. 30 tablet 0  . vitamin A 8000 UNIT capsule Take 8,000 Units by mouth daily.    . vitamin C (ASCORBIC ACID) 500 MG tablet Take 500 mg by mouth daily.     No current facility-administered medications for this visit.     No Known Allergies  Social History   Socioeconomic History  . Marital status:  Married    Spouse name: Jesusita OkaDan  . Number of children: Not on file  . Years of education: Not on file  . Highest education level: Not on file  Occupational History  . Occupation: Chief Financial OfficerGIS Analyst    Employer: [employer]  Social Needs  . Financial resource strain: Not on file  . Food insecurity    Worry: Not on file    Inability: Not on file  . Transportation needs    Medical: Not on file    Non-medical: Not on file  Tobacco Use  . Smoking status: Former Smoker    Years: 20.00    Types: Cigarettes  . Smokeless tobacco: Never Used  Substance and Sexual Activity  . Alcohol use: Yes    Alcohol/week: 1.0 standard drinks    Types: 1 Standard drinks or equivalent per week  . Drug use: Yes    Types: Marijuana    Comment: per pt "few times a year"  . Sexual activity: Yes    Partners: Male    Birth control/protection: Implant    Comment: sexually active w/ female husband  Lifestyle  . Physical activity    Days per week: 0 days    Minutes per session: Not on file  . Stress: Not on file  Relationships  . Social Musicianconnections    Talks on phone: Not on file    Gets together: Not on file    Attends religious service: Not on file    Active member of club or organization: Not on file    Attends meetings of clubs or organizations: Not on file    Relationship status: Not on file  . Intimate partner violence    Fear of current or ex partner: Not on file    Emotionally abused: Not on file    Physically abused: Not on file    Forced sexual activity: Not on file  Other Topics Concern  . Not on file  Social History Narrative   Drinks about 12-16oz of coffee a day, occasionally drinks a tea.   Woks on computer as Systems developeranalyst.     Review of Systems  Constitutional: Negative.  Negative for chills and fever.  HENT: Negative for congestion and sore throat.   Eyes: Negative.  Negative for blurred vision and double vision.  Respiratory: Negative.  Negative for cough and shortness of breath.    Cardiovascular: Negative.  Negative for chest pain and palpitations.  Gastrointestinal: Negative for abdominal pain, diarrhea, nausea and vomiting.  Genitourinary: Negative.   Musculoskeletal: Negative.  Negative for myalgias and neck pain.  Skin: Negative.  Negative for rash.  Neurological: Negative.  Negative for dizziness and headaches.  Endo/Heme/Allergies: Negative.   All other systems reviewed and are  negative.   Objective   Vitals as reported by the patient: Today's Vitals   10/22/18 1131  BP: 116/75  Pulse: 74  Temp: (!) 97.5 F (36.4 C)  TempSrc: Oral  Weight: 196 lb (88.9 kg)  Height: 5\' 3"  (1.6 m)  Physical Exam Constitutional:      Appearance: Normal appearance.  HENT:     Head: Normocephalic.  Eyes:     Extraocular Movements: Extraocular movements intact.  Neck:     Musculoskeletal: Normal range of motion.  Pulmonary:     Effort: Pulmonary effort is normal.  Neurological:     Mental Status: She is alert and oriented to person, place, and time.  Psychiatric:        Mood and Affect: Mood normal.        Behavior: Behavior normal.     There are no diagnoses linked to this encounter. Diagnoses and all orders for this visit:  Encounter to establish care  History of herpes zoster with meningitis -     valACYclovir (VALTREX) 500 MG tablet; Take 1 tablet (500 mg total) by mouth 2 (two) times daily. -     Ambulatory referral to Infectious Disease  Encounter for medication refill     I discussed the assessment and treatment plan with the patient. The patient was provided an opportunity to ask questions and all were answered. The patient agreed with the plan and demonstrated an understanding of the instructions.   The patient was advised to call back or seek an in-person evaluation if the symptoms worsen or if the condition fails to improve as anticipated.  I provided 15 minutes of non-face-to-face time during this encounter.  Horald Pollen, MD   [facility]

## 2018-11-18 ENCOUNTER — Ambulatory Visit: Payer: 59 | Admitting: Infectious Disease

## 2018-11-18 ENCOUNTER — Other Ambulatory Visit: Payer: Self-pay

## 2018-11-18 ENCOUNTER — Encounter: Payer: Self-pay | Admitting: Infectious Disease

## 2018-11-18 VITALS — BP 135/89 | HR 66 | Temp 97.9°F

## 2018-11-18 DIAGNOSIS — B003 Herpesviral meningitis: Secondary | ICD-10-CM

## 2018-11-18 DIAGNOSIS — G032 Benign recurrent meningitis [Mollaret]: Secondary | ICD-10-CM

## 2018-11-18 DIAGNOSIS — Z8619 Personal history of other infectious and parasitic diseases: Secondary | ICD-10-CM

## 2018-11-18 DIAGNOSIS — Z8661 Personal history of infections of the central nervous system: Secondary | ICD-10-CM

## 2018-11-18 HISTORY — DX: Benign recurrent meningitis (mollaret): G03.2

## 2018-11-18 MED ORDER — VALACYCLOVIR HCL 1 G PO TABS: 1000.0000 mg | ORAL_TABLET | Freq: Two times a day (BID) | ORAL | 4 refills | Status: DC

## 2018-11-18 MED ORDER — VALACYCLOVIR HCL 500 MG PO TABS: 500.0000 mg | ORAL_TABLET | Freq: Two times a day (BID) | ORAL | 3 refills | Status: AC

## 2018-11-18 NOTE — Progress Notes (Signed)
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   MRN: [id number]  HPI  Angela Farrell is a 46 year old African-American woman whom I met in 2010.  At that time she had carried a diagnosis of "aseptic meningitis and had undergone a lumbar puncture previously it was a long hospital.  It had not grown any organisms but did not sent been sent for PCR testing.  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  Indeed her HSV-2 PCR was positive in 2010 as it was again in 2013 on a different hospital admission.  She has been on Valtrex at times to prevent HSV 2 meningitis.  It is interesting because having prophylactic Valtrex to patients with genital herpes does reduce the frequency of genital outbreaks but in setting of HSV-2 meningitis the data is not clear that it helps and there is even some data to suggest it may increase the frequency of recurrent meningitis.  However in the case of Angela Farrell she feels very strongly that when she is not taking her Valtrex which she takes at a dose of 500 twice daily it is very soon after that she has an recurrence of HSV-2 meningitis.  Does have comorbid migraine headaches but she cannot tell the difference in particular once she starts having neck stiffness and she also at times will have fevers.  He says at present she has about 2 episodes of this per year.  Fortunately she has not been hospitalized again for it.  She is seeking some type of pain relief for this and certainly through my experience with treating patients with this type of meningitis it can be excruciatingly painful though not life-threatening and this manifestations of HSV-2.  With regards to her sexual health she is married in a monogamous relationship with her husband who is 5  years older than her.  She has been tested for HIV negative on several occasions including in 2019.  Past Medical History:  Diagnosis Date  . Allergy   . Anemia   . Anxiety   . Arthritis   . 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 Chronic headache disorder    s/p Meningitis x 2, tension- type plus allergic/sinus problems  . History of abnormal Pap smear   . Meningitis, viral At age 50 and at age 38  . Nasal fracture 2011  . Other and unspecified ovarian cysts 2011- pelvic CT   GYN- Dr. Carren Rang  . Vision abnormalities     Past Surgical History:  Procedure Laterality Date  . BACK SURGERY  02/09/15   L5 - S1 diskectomy  . FOOT SURGERY  s/p 2 procedures  . LAPAROSCOPY FOR ECTOPIC PREGNANCY  1998   Fallopian tube removed  . MOUTH SURGERY    . spinal injection  03/10/15  . SPINE SURGERY    . UTERINE ARTERY EMBOLIZATION      Family History  Problem Relation Age of Onset  . Migraines Father        cluster headache  . Hyperlipidemia Father   . Hypertension Father   . Cancer Mother 47       Breast  . Stroke Maternal Grandmother   . Diabetes Paternal Grandmother   . Diabetes Unknown        paternal relaitve  .  Seizures Unknown        maternal relative  . Rheum arthritis Unknown        maternal relatives      Social History   Socioeconomic History  . Marital status: Married    Spouse name: Linna Hoff  . Number of children: Not on file  . Years of education: Not on file  . Highest education level: Not on file  Occupational History  . Occupation: Hospital doctor: [employer]  . Financial resource strain: Not on file  . Food insecurity    Worry: Not on file    Inability: Not on file  . Transportation needs    Medical: Not on file    Non-medical: Not on file  Tobacco Use  . Smoking status: Former Smoker    Years: 20.00    Types: Cigarettes  . Smokeless tobacco: Never Used  Substance and Sexual Activity  . Alcohol use: Yes     Alcohol/week: 1.0 standard drinks    Types: 1 Standard drinks or equivalent per week  . Drug use: Yes    Types: Marijuana    Comment: per pt "few times a year"  . Sexual activity: Yes    Partners: Male    Birth control/protection: Implant    Comment: sexually active w/ female husband  Lifestyle  . Physical activity    Days per week: 0 days    Minutes per session: Not on file  . Stress: Not on file  Relationships  . Social Herbalist on phone: Not on file    Gets together: Not on file    Attends religious service: Not on file    Active member of club or organization: Not on file    Attends meetings of clubs or organizations: Not on file    Relationship status: Not on file  Other Topics Concern  . Not on file  Social History Narrative   Drinks about 12-16oz of coffee a day, occasionally drinks a tea.   Woks on computer as Administrator.     No Known Allergies   Current Outpatient Medications:  .  B Complex-C (SUPER B COMPLEX/VITAMIN C PO), Take by mouth., Disp: , Rfl:  .  Calcium-Magnesium-Vitamin D (CALCIUM 1200+D3 PO), Take 1,200 Units by mouth., Disp: , Rfl:  .  Cholecalciferol (D3-1000 PO), Take 2,000 Units by mouth., Disp: , Rfl:  .  Coenzyme Q10 (CO Q 10 PO), Take 50 mg by mouth daily., Disp: , Rfl:  .  Galcanezumab-gnlm (EMGALITY) 120 MG/ML SOAJ, Emgality Pen 120 mg/mL subcutaneous pen injector, Disp: , Rfl:  .  GLUCOSAMINE-CHONDROITIN PO, Take 1 tablet by mouth daily., Disp: , Rfl:  .  Lutein 40 MG CAPS, Take 40 mg by mouth daily., Disp: , Rfl:  .  Magnesium 500 MG CAPS, Take 500 mg by mouth 4 (four) times daily., Disp: , Rfl:  .  omeprazole (PRILOSEC) 40 MG capsule, TAKE 1 CAPSULE BY MOUTH TWICE A DAY AS NEEDED BEFORE MEALS BY 30 MINTUES, Disp: 180 capsule, Rfl: 3 .  Potassium Gluconate 595 MG CAPS, Take 595 mg by mouth daily., Disp: , Rfl:  .  propranolol (INDERAL) 40 MG tablet, Take 1 tablet (40 mg total) by mouth 2 (two) times daily., Disp: 60 tablet, Rfl: 12 .   rizatriptan (MAXALT-MLT) 10 MG disintegrating tablet, Take 1 tablet (10 mg total) by mouth as needed for migraine. May repeat in 2 hours if needed, Disp: 9  tablet, Rfl: 11 .  topiramate (TOPAMAX) 100 MG tablet, Take 1 tablet (100 mg total) by mouth 2 (two) times daily., Disp: 60 tablet, Rfl: 12 .  valACYclovir (VALTREX) 500 MG tablet, Take 1 tablet (500 mg total) by mouth 2 (two) times daily., Disp: 180 tablet, Rfl: 3 .  vitamin A 8000 UNIT capsule, Take 8,000 Units by mouth daily., Disp: , Rfl:  .  vitamin C (ASCORBIC ACID) 500 MG tablet, Take 500 mg by mouth daily., Disp: , Rfl:  .  Erenumab-aooe (AIMOVIG) 70 MG/ML SOAJ, Inject 70 mg into the skin every 30 (thirty) days., Disp: 3 pen, Rfl: 4 .  valACYclovir (VALTREX) 1000 MG tablet, Take 1 tablet (1,000 mg total) by mouth 2 (two) times daily for 14 days., Disp: 28 tablet, Rfl: 4   Review of Systems  Constitutional: Negative for chills and fever.  HENT: Negative for congestion and sore throat.   Eyes: Negative for photophobia.  Respiratory: Negative for cough, shortness of breath and wheezing.   Cardiovascular: Negative for chest pain, palpitations and leg swelling.  Gastrointestinal: Negative for abdominal pain, blood in stool, constipation, diarrhea, nausea and vomiting.  Genitourinary: Negative for dysuria, flank pain and hematuria.  Musculoskeletal: Negative for back pain and myalgias.  Skin: Negative for rash.  Neurological: Positive for headaches. Negative for dizziness and weakness.  Hematological: Does not bruise/bleed easily.  Psychiatric/Behavioral: Negative for suicidal ideas.       Objective:   Physical Exam Constitutional:      General: She is not in acute distress.    Appearance: Normal appearance. She is well-developed. She is not ill-appearing or diaphoretic.  HENT:     Head: Normocephalic and atraumatic.     Right Ear: Hearing and external ear normal.     Left Ear: Hearing and external ear normal.     Nose: No  nasal deformity or rhinorrhea.  Eyes:     General: No scleral icterus.    Conjunctiva/sclera: Conjunctivae normal.     Right eye: Right conjunctiva is not injected.     Left eye: Left conjunctiva is not injected.     Pupils: Pupils are equal, round, and reactive to light.  Neck:     Musculoskeletal: Normal range of motion and neck supple.     Vascular: No JVD.  Cardiovascular:     Rate and Rhythm: Normal rate and regular rhythm.     Heart sounds: S1 normal and S2 normal.  Pulmonary:     Effort: Pulmonary effort is normal. No respiratory distress.     Breath sounds: No wheezing.  Abdominal:     General: Bowel sounds are normal. There is no distension.     Palpations: Abdomen is soft.     Tenderness: There is no abdominal tenderness.  Musculoskeletal: Normal range of motion.     Right shoulder: Normal.     Left shoulder: Normal.     Right hip: Normal.     Left hip: Normal.     Right knee: Normal.     Left knee: Normal.  Lymphadenopathy:     Head:     Right side of head: No submandibular, preauricular or posterior auricular adenopathy.     Left side of head: No submandibular, preauricular or posterior auricular adenopathy.     Cervical: No cervical adenopathy.     Right cervical: No superficial or deep cervical adenopathy.    Left cervical: No superficial or deep cervical adenopathy.  Skin:    General: Skin is warm  and dry.     Coloration: Skin is not pale.     Findings: No abrasion, bruising, ecchymosis, erythema, lesion or rash.     Nails: There is no clubbing.   Neurological:     General: No focal deficit present.     Mental Status: She is alert and oriented to person, place, and time.     Sensory: No sensory deficit.     Coordination: Coordination normal.     Gait: Gait normal.  Psychiatric:        Attention and Perception: She is attentive.        Mood and Affect: Mood normal.        Speech: Speech normal.        Behavior: Behavior normal. Behavior is cooperative.         Thought Content: Thought content normal.        Judgment: Judgment normal.           Assessment & Plan:   HSV 2 Recurrent Mollaret's meningitis: Several studies have not shown benefit to prophylactic Valtrex for this form of meningitis this particular individual seems to respond to this and it seems to due to her frequency of headaches too much fewer times per year than she had when she was younger and not on prophylactic Valtrex.  She takes Valtrex 500 twice daily for prophylaxis.  Would also like her to have a treatment dose of 1 g twice daily x14 days prescription on hand to take if she has occurrence of HSV-2 meningitis.  1 to make sure she is using therapeutic doses of the antiviral when she has actual outbreaks of meningitis.  Given that she is not immunocompromised I do not think that we are going to run into an issue of acyclovir resistance here.  HIV prevention: She is not appear to be at risk for HIV based on the sexual history she provided and she has been tested for HIV in 2019.  I spent greater than 60 minutes with the patient including greater than 50% of time in face to face counsel of the patient the nature of HSV-2 meningitis, data regarding suppressive Valtrex but also looking at her case very closely and in coordination of her care.

## 2018-12-11 ENCOUNTER — Other Ambulatory Visit: Payer: Self-pay | Admitting: Diagnostic Neuroimaging

## 2018-12-18 ENCOUNTER — Encounter: Payer: Self-pay | Admitting: *Deleted

## 2019-01-20 ENCOUNTER — Encounter: Payer: Self-pay | Admitting: Emergency Medicine

## 2019-02-17 ENCOUNTER — Ambulatory Visit: Payer: 59 | Admitting: Adult Health

## 2019-02-17 ENCOUNTER — Encounter: Payer: Self-pay | Admitting: Adult Health

## 2019-02-17 ENCOUNTER — Other Ambulatory Visit: Payer: Self-pay | Admitting: Diagnostic Neuroimaging

## 2019-02-17 ENCOUNTER — Other Ambulatory Visit: Payer: Self-pay

## 2019-02-17 VITALS — BP 114/77 | HR 74 | Temp 96.9°F | Ht 63.0 in | Wt 195.4 lb

## 2019-02-17 DIAGNOSIS — S161XXA Strain of muscle, fascia and tendon at neck level, initial encounter: Secondary | ICD-10-CM

## 2019-02-17 DIAGNOSIS — M542 Cervicalgia: Secondary | ICD-10-CM | POA: Diagnosis not present

## 2019-02-17 DIAGNOSIS — G43009 Migraine without aura, not intractable, without status migrainosus: Secondary | ICD-10-CM | POA: Diagnosis not present

## 2019-02-17 NOTE — Progress Notes (Signed)
PATIENT: Angela Farrell DOB: [birth year]  REASON FOR VISIT: follow up HISTORY FROM: patient  HISTORY OF PRESENT ILLNESS: Today 02/17/19: Angela Farrell is a 43 year old female with a history of migraine headaches.  She returns today for follow-up.  She states that she has not had Emgality since July.  She did not realize it had been this long.  She is not sure why she was not notified by her pharmacy that her prescription was ready.  She states that her migraines have been worse off of Emgality.  She states that a couple weeks ago she had a migraine that lasted 1 week.  She also states that she has noticed some neck soreness and stiffness.  She states that it seems worse after she is has slept.  She denies any pain down the arms.  No numbness or tingling down the arms.  She does work at a computer all day long.  She returns today for an evaluation.  HISTORY UPDATE (12/10/17, VRP): Since last visit, doing about the same. Symptoms are moderate. Still with daily headaches + 2 severe migraines per month. No alleviating or aggravating factors. Tolerating meds.    UPDATE 10/23/16: Since last visit, was doing well until last few months. Now daily headaches, and severe migraine 1 per week. [place] 4th week, and having more migraines.   UPDATE 04/25/16: Since last visit, doing well. Only 2-3 migraine in the last 6-7 months. Much improved.  Last migraine was a few weeks ago. Has been doing better with yoga and exercise.  UPDATE 09/21/15: Since last visit, overall stable. Avg 1-2 migraine per month. Avg 3-4 HA per week. Currently with right forehead headache.   UPDATE 06/21/15: Since last visit, was doing well with higher TPX, but now more work stress, and more HA since March 2017.  UPDATE 03/23/15: Since last visit, was doing better with TPX + rizatriptan; then had back surgery Feb 09, 2015; then Dec 2016 having more HA.  PRIOR HPI (12/25/14): 43 year old ambidextrous  female here for evaluation of headaches. 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 Patient has history of headaches starting in childhood around 43 years old. She describes frontal, eye pressure, throbbing severe headaches with neck pain, photophobia, phonophobia and occasionally nausea. Headaches can last hours to days at a time. Triggers include stress. Over the years headaches varied in frequency and severity. More recently headaches have been occurring roughly 4-6 days per month. Patient has family history of cluster headache in her father. Patient herself has never been diagnosed with migraine headaches or tried migraine medications. Patient had been hospitalized several times for headache, light sensitivity, neck pain, fevers, with testing and lumbar puncture results demonstrating viral/aseptic meningitis. 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 On the basis of this patient was diagnosed with recurrent meningitis (Mollaret's) in 2013. Since that time she has been prescribed valacyclovir 500 mg twice a day to be taken during times of recurrence of symptoms. She uses this 6-8 months out of the year, 3-4 days at a time. This has helped keep some of her symptoms under control. Her last hospitalization was in 2013 for meningitis. More recently, last night patient had recurrence of headache. She had some nausea and vomiting last night. This morning and during office visit patient is suffering with severe headache, sensitivity to light, neck pain. Patient states that her current symptoms feel slightly similar to her  prior meningitis attacks but not as severe. Patient has not started valacyclovir yet for this attack.   REVIEW OF SYSTEMS: Out of a complete 14 system review of symptoms, the patient complains only of the following symptoms, and all other reviewed systems are negative.  See HPI  ALLERGIES: No Known Allergies  HOME MEDICATIONS: Outpatient  Medications Prior to Visit  Medication Sig Dispense Refill  . B Complex-C (SUPER B COMPLEX/VITAMIN C PO) Take by mouth.    . Calcium-Magnesium-Vitamin D (CALCIUM 1200+D3 PO) Take 1,200 Units by mouth.    . Cholecalciferol (D3-1000 PO) Take 2,000 Units by mouth.    . Coenzyme Q10 (CO Q 10 PO) Take 50 mg by mouth daily.    Marland Kitchen. etonogestrel (NEXPLANON) 68 MG IMPL implant Nexplanon 68 mg subdermal implant  Removal due 02/2019    . Galcanezumab-gnlm (EMGALITY) 120 MG/ML SOAJ Emgality Pen 120 mg/mL subcutaneous pen injector    . GLUCOSAMINE-CHONDROITIN PO Take 1 tablet by mouth daily.    . Lutein 40 MG CAPS Take 40 mg by mouth daily.    . Magnesium 500 MG CAPS Take 500 mg by mouth 4 (four) times daily.    Marland Kitchen. omeprazole (PRILOSEC) 40 MG capsule TAKE 1 CAPSULE BY MOUTH TWICE A DAY AS NEEDED BEFORE MEALS BY 30 MINTUES 180 capsule 3  . Potassium Gluconate 595 MG CAPS Take 595 mg by mouth daily.    . propranolol (INDERAL) 40 MG tablet Take 1 tablet (40 mg total) by mouth 2 (two) times daily. 60 tablet 12  . rizatriptan (MAXALT-MLT) 10 MG disintegrating tablet Take 1 tablet (10 mg total) by mouth as needed for migraine. May repeat in 2 hours if needed 9 tablet 11  . topiramate (TOPAMAX) 100 MG tablet Take 1 tablet (100 mg total) by mouth 2 (two) times daily. 60 tablet 12  . vitamin A 8000 UNIT capsule Take 8,000 Units by mouth daily.    . vitamin C (ASCORBIC ACID) 500 MG tablet Take 500 mg by mouth daily.    Dorise Hiss. Erenumab-aooe (AIMOVIG) 70 MG/ML SOAJ Inject 70 mg into the skin every 30 (thirty) days. 3 pen 4   No facility-administered medications prior to visit.     PAST MEDICAL HISTORY: Past Medical History:  Diagnosis Date  . Allergy   . Anemia   . Anxiety   . Arthritis   . 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 Chronic headache disorder    s/p Meningitis x 2, tension- type plus allergic/sinus problems  . History of abnormal Pap smear   . Meningitis, viral At age 43 and at age 43  .  Nasal fracture 2011  . Other and unspecified ovarian cysts 2011- pelvic CT   GYN- Dr. Chevis PrettyMezer  . Vision abnormalities     PAST SURGICAL HISTORY: Past Surgical History:  Procedure Laterality Date  . BACK SURGERY  02/09/15   L5 - S1 diskectomy  . FOOT SURGERY  s/p 2 procedures  . LAPAROSCOPY FOR ECTOPIC PREGNANCY  1998   Fallopian tube removed  . MOUTH SURGERY    . spinal injection  03/10/15  . SPINE SURGERY    . UTERINE ARTERY EMBOLIZATION      FAMILY HISTORY: Family History  Problem Relation Age of Onset  . Migraines Father        cluster headache  . Hyperlipidemia Father   . Hypertension Father   . Cancer Mother [id number]       Breast  . Stroke Maternal Grandmother   .  Diabetes Paternal Grandmother   . Diabetes Unknown        paternal relaitve  . Seizures Unknown        maternal relative  . Rheum arthritis Unknown        maternal relatives    SOCIAL HISTORY: Social History   Socioeconomic History  . Marital status: Married    Spouse name: Jesusita Oka  . Number of children: Not on file  . Years of education: Not on file  . Highest education level: Not on file  Occupational History  . Occupation: Chief Financial Officer: [employer]  Social Needs  . Financial resource strain: Not on file  . Food insecurity    Worry: Not on file    Inability: Not on file  . Transportation needs    Medical: Not on file    Non-medical: Not on file  Tobacco Use  . Smoking status: Former Smoker    Years: 20.00    Types: Cigarettes  . Smokeless tobacco: Never Used  Substance and Sexual Activity  . Alcohol use: Yes    Alcohol/week: 1.0 standard drinks    Types: 1 Standard drinks or equivalent per week  . Drug use: Yes    Types: Marijuana    Comment: per pt "few times a year"  . Sexual activity: Yes    Partners: Male    Birth control/protection: Implant    Comment: sexually active w/ female husband  Lifestyle  . Physical activity    Days per week: 0 days    Minutes per session:  Not on file  . Stress: Not on file  Relationships  . Social Musician on phone: Not on file    Gets together: Not on file    Attends religious service: Not on file    Active member of club or organization: Not on file    Attends meetings of clubs or organizations: Not on file    Relationship status: Not on file  . Intimate partner violence    Fear of current or ex partner: Not on file    Emotionally abused: Not on file    Physically abused: Not on file    Forced sexual activity: Not on file  Other Topics Concern  . Not on file  Social History Narrative   Drinks about 12-16oz of coffee a day, occasionally drinks a tea.   [employer]oks on computer as Systems developer.       PHYSICAL EXAM  Vitals:   02/17/19 1429  BP: 114/77  Pulse: 74  Temp: (!) 96.9 F (36.1 C)  TempSrc: Oral  Weight: 195 lb 6.4 oz (88.6 kg)  Height: 5\' 3"  (1.6 m)   Body mass index is 34.61 kg/m.  Generalized: Well developed, in no acute distress   Neurological examination  Mentation: Alert oriented to time, place, history taking. Follows all commands speech and language fluent Cranial nerve II-XII: Pupils were equal round reactive to light. Extraocular movements were full, visual field were full on confrontational test.  Head turning and shoulder shrug  were normal and symmetric. Motor: The motor testing reveals 5 over 5 strength of all 4 extremities. Good symmetric motor tone is noted throughout.  Sensory: Sensory testing is intact to soft touch on all 4 extremities. No evidence of extinction is noted.  Coordination: Cerebellar testing reveals good finger-nose-finger and heel-to-shin bilaterally.  Gait and station: Gait is normal.  Reflexes: Deep tendon reflexes are symmetric and normal bilaterally.   DIAGNOSTIC  DATA (LABS, IMAGING, TESTING) - I reviewed patient records, labs, notes, testing and imaging myself where available.  Lab Results  Component Value Date   WBC 4.7 02/15/2018   HGB 12.7  02/15/2018   HCT 39.5 02/15/2018   MCV 91 02/15/2018   PLT 345 02/15/2018      Component Value Date/Time   NA 140 02/21/2018 1106   K 4.1 02/21/2018 1106   CL 107 (H) 02/21/2018 1106   CO2 20 02/21/2018 1106   GLUCOSE 89 02/21/2018 1106   GLUCOSE 100 (H) 01/09/2017 1146   BUN 9 02/21/2018 1106   CREATININE 0.95 02/21/2018 1106   CREATININE 0.78 12/21/2016 0855   CALCIUM 8.8 02/21/2018 1106   PROT 6.9 02/21/2018 1106   ALBUMIN 4.3 02/21/2018 1106   AST 31 02/21/2018 1106   ALT 50 (H) 02/21/2018 1106   ALKPHOS 51 02/21/2018 1106   BILITOT 0.6 02/21/2018 1106   GFRNONAA 73 02/21/2018 1106   GFRNONAA >89 10/14/2014 0944   GFRAA 84 02/21/2018 1106   GFRAA >89 10/14/2014 0944   Lab Results  Component Value Date   CHOL 226 (H) 02/15/2018   HDL 50 02/15/2018   LDLCALC 147 (H) 02/15/2018   TRIG 143 02/15/2018   CHOLHDL 4.5 (H) 02/15/2018   Lab Results  Component Value Date   HGBA1C 4.8 02/15/2018   No results found for: [id number] Lab Results  Component Value Date   TSH 0.847 02/15/2018      ASSESSMENT AND PLAN 43 y.o. year old female  has a past medical history of Allergy, Anemia, Anxiety, Arthritis, Benign recurrent meningitis (mollaret) (11/18/2018), Chronic headache disorder, History of abnormal Pap smear, Meningitis, viral (At age 61 and at age 67), Nasal fracture (2011), Other and unspecified ovarian cysts (2011- pelvic CT), and Vision abnormalities. here with:  1.  Migraine headache  -Continue propranolol 40 mg twice a day -Continue Topamax 100 mg twice a day -Continue rizatriptan -Restart Emgality  2.  Cervical strain  -Printout of neck exercises provided to the patient -Can consider physical therapy in the future if needed  Patient is advised that if her symptoms worsen or she develops new symptoms she should let us know.  She will follow-up in 6 months or sooner if needed.      Butch Penny, MSN, NP-C 02/17/2019, 2:33 PM [facility] [address], [address] [place], [place] [address] [phone]

## 2019-02-17 NOTE — Progress Notes (Signed)
I reviewed note and agree with plan.   Penni Bombard, MD 12/30/5745, 3:40 PM Certified in Neurology, Neurophysiology and Neuroimaging  [facility] [address] [place], [place] [address] [phone]

## 2019-02-17 NOTE — Patient Instructions (Signed)
Your Plan:  Restart Emgality Continue propranolol and Topamax Continue Rizatriptan  Neck exercises if no improvement can do PT  Thank you for coming to see [place] at [facility]. I hope we have been able to provide you high quality care today.  You may receive a patient satisfaction survey over the next few weeks. We would appreciate your feedback and comments so that we may continue to improve ourselves and the health of our patients.   Cervical Strain and Sprain Rehab Ask your health care provider which exercises are safe for you. Do exercises exactly as told by your health care provider and adjust them as directed. It is normal to feel mild stretching, pulling, tightness, or discomfort as you do these exercises. Stop right away if you feel sudden pain or your pain gets worse. Do not begin these exercises until told by your health care provider. Stretching and range-of-motion exercises Cervical side bending  1. Using good posture, sit on a stable chair or stand up. 2. Without moving your shoulders, slowly tilt your left / right ear to your shoulder until you feel a stretch in the opposite side neck muscles. You should be looking straight ahead. 3. Hold for __________ seconds. 4. Repeat with the other side of your neck. Repeat __________ times. Complete this exercise __________ times a day. Cervical rotation  1. Using good posture, sit on a stable chair or stand up. 2. Slowly turn your head to the side as if you are looking over your left / right shoulder. ? Keep your eyes level with the ground. ? Stop when you feel a stretch along the side and the back of your neck. 3. Hold for __________ seconds. 4. Repeat this by turning to your other side. Repeat __________ times. Complete this exercise __________ times a day. Thoracic extension and pectoral stretch 1. Roll a towel or a small blanket so it is about 4 inches (10 cm) in diameter. 2. Lie down on your back on a firm  surface. 3. Put the towel lengthwise, under your spine in the middle of your back. It should not be under your shoulder blades. The towel should line up with your spine from your middle back to your lower back. 4. Put your hands behind your head and let your elbows fall out to your sides. 5. Hold for __________ seconds. Repeat __________ times. Complete this exercise __________ times a day. Strengthening exercises Isometric upper cervical flexion 1. Lie on your back with a thin pillow behind your head and a small rolled-up towel under your neck. 2. Gently tuck your chin toward your chest and nod your head down to look toward your feet. Do not lift your head off the pillow. 3. Hold for __________ seconds. 4. Release the tension slowly. Relax your neck muscles completely before you repeat this exercise. Repeat __________ times. Complete this exercise __________ times a day. Isometric cervical extension  1. Stand about 6 inches (15 cm) away from a wall, with your back facing the wall. 2. Place a soft object, about 6-8 inches (15-20 cm) in diameter, between the back of your head and the wall. A soft object could be a small pillow, a ball, or a folded towel. 3. Gently tilt your head back and press into the soft object. Keep your jaw and forehead relaxed. 4. Hold for __________ seconds. 5. Release the tension slowly. Relax your neck muscles completely before you repeat this exercise. Repeat __________ times. Complete this exercise __________ times a day.  Posture and body mechanics Body mechanics refers to the movements and positions of your body while you do your daily activities. Posture is part of body mechanics. Good posture and healthy body mechanics can help to relieve stress in your body's tissues and joints. Good posture means that your spine is in its natural S-curve position (your spine is neutral), your shoulders are pulled back slightly, and your head is not tipped forward. The following  are general guidelines for applying improved posture and body mechanics to your everyday activities. Sitting  1. When sitting, keep your spine neutral and keep your feet flat on the floor. Use a footrest, if necessary, and keep your thighs parallel to the floor. Avoid rounding your shoulders, and avoid tilting your head forward. 2. When working at a desk or a computer, keep your desk at a height where your hands are slightly lower than your elbows. Slide your chair under your desk so you are close enough to maintain good posture. 3. When working at a computer, place your monitor at a height where you are looking straight ahead and you do not have to tilt your head forward or downward to look at the screen. Standing   When standing, keep your spine neutral and keep your feet about hip-width apart. Keep a slight bend in your knees. Your ears, shoulders, and hips should line up.  When you do a task in which you stand in one place for a long time, place one foot up on a stable object that is 2-4 inches (5-10 cm) high, such as a footstool. This helps keep your spine neutral. Resting When lying down and resting, avoid positions that are most painful for you. Try to support your neck in a neutral position. You can use a contour pillow or a small rolled-up towel. Your pillow should support your neck but not push on it. This information is not intended to replace advice given to you by your health care provider. Make sure you discuss any questions you have with your health care provider. Document Released: 03/20/2005 Document Revised: 07/10/2018 Document Reviewed: 12/19/2017 Elsevier Patient Education  2020 [employer].

## 2019-04-30 ENCOUNTER — Encounter: Payer: Self-pay | Admitting: Adult Health

## 2019-05-17 ENCOUNTER — Telehealth: Payer: Self-pay

## 2019-05-17 ENCOUNTER — Other Ambulatory Visit: Payer: Self-pay | Admitting: Diagnostic Neuroimaging

## 2019-05-17 NOTE — Telephone Encounter (Signed)
Called [facility] and spoke to pharmacy about acyclovir. Pharmacist stated he was able to get it worked out and will fill it. Nothing else needed to be done.

## 2019-05-19 ENCOUNTER — Other Ambulatory Visit: Payer: Self-pay | Admitting: Neurology

## 2019-05-19 MED ORDER — EMGALITY 120 MG/ML ~~LOC~~ SOAJ: 1.0000 "pen " | SUBCUTANEOUS | 5 refills | Status: DC

## 2019-05-21 ENCOUNTER — Ambulatory Visit: Payer: 59 | Admitting: Infectious Disease

## 2019-06-03 ENCOUNTER — Telehealth: Payer: Self-pay

## 2019-06-03 NOTE — Telephone Encounter (Signed)
COVID-19 Pre-Screening Questions:06/03/19  Do you currently have a fever (>100 F), chills or unexplained body aches? NO   Are you currently experiencing new cough, shortness of breath, sore throat, runny nose?NO  .  Have you recently travelled outside the state of [place] in the last 14 days?NO  .  Have you been in contact with someone that is currently pending confirmation of Covid19 testing or has been confirmed to have the Covid19 virus?  NO   **If the patient answers NO to ALL questions -  advise the patient to please call the clinic before coming to the office should any symptoms develop.     

## 2019-06-04 ENCOUNTER — Ambulatory Visit: Payer: 59 | Admitting: Infectious Disease

## 2019-06-04 ENCOUNTER — Other Ambulatory Visit: Payer: Self-pay

## 2019-06-04 ENCOUNTER — Encounter: Payer: Self-pay | Admitting: Infectious Disease

## 2019-06-04 VITALS — BP 122/82 | HR 80 | Temp 98.0°F | Wt 195.0 lb

## 2019-06-04 DIAGNOSIS — G43809 Other migraine, not intractable, without status migrainosus: Secondary | ICD-10-CM

## 2019-06-04 DIAGNOSIS — R519 Headache, unspecified: Secondary | ICD-10-CM | POA: Diagnosis not present

## 2019-06-04 DIAGNOSIS — B003 Herpesviral meningitis: Secondary | ICD-10-CM | POA: Diagnosis not present

## 2019-06-04 DIAGNOSIS — G032 Benign recurrent meningitis [Mollaret]: Secondary | ICD-10-CM | POA: Diagnosis not present

## 2019-06-04 DIAGNOSIS — Z8661 Personal history of infections of the central nervous system: Secondary | ICD-10-CM | POA: Diagnosis not present

## 2019-06-04 DIAGNOSIS — G8929 Other chronic pain: Secondary | ICD-10-CM

## 2019-06-04 DIAGNOSIS — N939 Abnormal uterine and vaginal bleeding, unspecified: Secondary | ICD-10-CM

## 2019-06-04 HISTORY — DX: Abnormal uterine and vaginal bleeding, unspecified: N93.9

## 2019-06-04 MED ORDER — VALACYCLOVIR HCL 500 MG PO TABS: 500.0000 mg | ORAL_TABLET | Freq: Two times a day (BID) | ORAL | 11 refills | Status: DC

## 2019-06-04 MED ORDER — VALACYCLOVIR HCL 1 G PO TABS: 1000.0000 mg | ORAL_TABLET | Freq: Two times a day (BID) | ORAL | 5 refills | Status: AC

## 2019-06-04 NOTE — Patient Instructions (Signed)
COVID-19 Vaccine Information can be found at: [email or web address] For questions related to vaccine distribution or appointments, please email [email or web address] or call [phone]    

## 2019-06-04 NOTE — Progress Notes (Signed)
Subjective:  Chief complaint: Headaches and dizziness this weekend as well as spotting  [name], female    DOB: May 16, 1975, 44 y.o.   MRN: [id number]  HPI  Angela Farrell is a 44 year old African-American woman whom I met in 2010.  At that time she had carried a diagnosis of "aseptic meningitis and had undergone a lumbar puncture previously it was a long hospital.  It had not grown any organisms but did not sent been sent for PCR testing.  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  Indeed her HSV-2 PCR was positive in 2010 as it was again in 2013 on a different hospital admission.  She has been on Valtrex at times to prevent HSV 2 meningitis.  It is interesting because having prophylactic Valtrex to patients with genital herpes does reduce the frequency of genital outbreaks but in setting of HSV-2 meningitis the data is not clear that it helps and there is even some data to suggest it may increase the frequency of recurrent meningitis.  However in the case of Angela Farrell she has felt very strongly that when she is not taking her Valtrex which she takes at a dose of 500 twice daily it is very soon after that she has an recurrence of HSV-2 meningitis.  Does have comorbid migraine headaches but she can sometimes tell the difference in particular once she starts having neck stiffness and she also at times will have fevers.  When I last saw her she said she was having these episodes about 4 times a day Fortunately she has not been hospitalized again for it.  When I saw her in August as a consult I recommended that she have treatment dose of Valtrex around along with the Valtrex 500 mg twice daily she takes for prophylaxis.  Since then she believes she has had an additional 3 episodes of meningitis.  It is complicated however because she her migraine headaches have apparently worsened  recently.  She is implantable birth control which is helped with her periods but is still having spotting is particular this last week and had some headaches and other symptoms that she previously had attributed to PMS.  Over the weekend she felt more unusual with excessive sleepiness and headaches and dizziness.  This is improved in the interim though she did not feel that it improved with her taking Valtrex so she went back to prophylactic Valtrex.  She is also in see neurology and is being treated by them for migraine headaches.     Past Medical History:  Diagnosis Date  . Allergy   . Anemia   . Anxiety   . Arthritis   . 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 Chronic headache disorder    s/p Meningitis x 2, tension- type plus allergic/sinus problems  . History of abnormal Pap smear   . Meningitis, viral At age 44 and at age 72  . Nasal fracture 2011  . Other and unspecified ovarian cysts 2011- pelvic CT   GYN- Dr. Carren Rang  . Vision abnormalities     Past Surgical History:  Procedure Laterality Date  . BACK SURGERY  02/09/15   L5 - S1 diskectomy  . FOOT SURGERY  s/p 2 procedures  . LAPAROSCOPY FOR ECTOPIC PREGNANCY  1998   Fallopian tube removed  . MOUTH SURGERY    . spinal injection  03/10/15  . SPINE SURGERY    .  UTERINE ARTERY EMBOLIZATION      Family History  Problem Relation Age of Onset  . Migraines Father        cluster headache  . Hyperlipidemia Father   . Hypertension Father   . Cancer Mother 46       Breast  . Stroke Maternal Grandmother   . Diabetes Paternal Grandmother   . Diabetes Unknown        paternal relaitve  . Seizures Unknown        maternal relative  . Rheum arthritis Unknown        maternal relatives      Social History   Socioeconomic History  . Marital status: Married    Spouse name: Linna Hoff  . Number of children: Not on file  . Years of education: Not on file  . Highest education level: Not on file  Occupational History   . Occupation: Hospital doctor: [employer]  Tobacco Use  . Smoking status: Former Smoker    Years: 20.00    Types: Cigarettes  . Smokeless tobacco: Never Used  Substance and Sexual Activity  . Alcohol use: Yes    Alcohol/week: 1.0 standard drinks    Types: 1 Standard drinks or equivalent per week  . Drug use: Yes    Types: Marijuana    Comment: per pt "few times a year"  . Sexual activity: Yes    Partners: Male    Birth control/protection: Implant    Comment: sexually active w/ female husband  Other Topics Concern  . Not on file  Social History Narrative   Drinks about 12-16oz of coffee a day, occasionally drinks a tea.   [employer] on Teaching laboratory technician as Administrator.    Social Determinants of Health   Financial Resource Strain:   . Difficulty of Paying Living Expenses: Not on file  Food Insecurity:   . Worried About Charity fundraiser in the Last Year: Not on file  . Ran Out of Food in the Last Year: Not on file  Transportation Needs:   . Lack of Transportation (Medical): Not on file  . Lack of Transportation (Non-Medical): Not on file  Physical Activity:   . Days of Exercise per Week: Not on file  . Minutes of Exercise per Session: Not on file  Stress:   . Feeling of Stress : Not on file  Social Connections:   . Frequency of Communication with Friends and Family: Not on file  . Frequency of Social Gatherings with Friends and Family: Not on file  . Attends Religious Services: Not on file  . Active Member of Clubs or Organizations: Not on file  . Attends Archivist Meetings: Not on file  . Marital Status: Not on file    No Known Allergies   Current Outpatient Medications:  .  B Complex-C (SUPER B COMPLEX/VITAMIN C PO), Take by mouth., Disp: , Rfl:  .  Calcium-Magnesium-Vitamin D (CALCIUM 1200+D3 PO), Take 1,200 Units by mouth., Disp: , Rfl:  .  Cholecalciferol (D3-1000 PO), Take 2,000 Units by mouth., Disp: , Rfl:  .  Coenzyme Q10 (CO Q 10 PO), Take 50 mg  by mouth daily., Disp: , Rfl:  .  etonogestrel (NEXPLANON) 68 MG IMPL implant, Nexplanon 68 mg subdermal implant  Removal due 02/2019, Disp: , Rfl:  .  Galcanezumab-gnlm (EMGALITY) 120 MG/ML SOAJ, Inject 1 pen into the skin every 30 (thirty) days., Disp: 1 pen, Rfl: 5 .  GLUCOSAMINE-CHONDROITIN PO, Take 1  tablet by mouth daily., Disp: , Rfl:  .  Lutein 40 MG CAPS, Take 40 mg by mouth daily., Disp: , Rfl:  .  Magnesium 500 MG CAPS, Take 500 mg by mouth 4 (four) times daily., Disp: , Rfl:  .  omeprazole (PRILOSEC) 40 MG capsule, TAKE 1 CAPSULE BY MOUTH TWICE A DAY AS NEEDED BEFORE MEALS BY 30 MINTUES, Disp: 180 capsule, Rfl: 3 .  Potassium Gluconate 595 MG CAPS, Take 595 mg by mouth daily., Disp: , Rfl:  .  propranolol (INDERAL) 40 MG tablet, Take 1 tablet (40 mg total) by mouth 2 (two) times daily., Disp: 60 tablet, Rfl: 12 .  rizatriptan (MAXALT-MLT) 10 MG disintegrating tablet, TAKE 1 TABLET BY MOUTH AS NEEDED FOR MIGRAINE. MAY REPEAT IN 2 HOURS IF NEEDED, Disp: 9 tablet, Rfl: 11 .  topiramate (TOPAMAX) 100 MG tablet, Take 1 tablet (100 mg total) by mouth 2 (two) times daily., Disp: 60 tablet, Rfl: 12 .  vitamin A 8000 UNIT capsule, Take 8,000 Units by mouth daily., Disp: , Rfl:  .  vitamin C (ASCORBIC ACID) 500 MG tablet, Take 500 mg by mouth daily., Disp: , Rfl:    Review of Systems  Constitutional: Negative for chills and fever.  HENT: Negative for congestion and sore throat.   Eyes: Negative for photophobia.  Respiratory: Negative for cough, shortness of breath and wheezing.   Cardiovascular: Negative for chest pain, palpitations and leg swelling.  Gastrointestinal: Negative for abdominal pain, blood in stool, constipation, diarrhea, nausea and vomiting.  Genitourinary: Positive for menstrual problem and vaginal bleeding. Negative for dysuria, flank pain and hematuria.  Musculoskeletal: Negative for back pain and myalgias.  Skin: Negative for rash.  Neurological: Positive for dizziness  and headaches. Negative for weakness.  Hematological: Does not bruise/bleed easily.  Psychiatric/Behavioral: Negative for agitation and suicidal ideas. The patient is not hyperactive.        Objective:   Physical Exam Constitutional:      General: She is not in acute distress.    Appearance: Normal appearance. She is well-developed. She is not ill-appearing or diaphoretic.  HENT:     Head: Normocephalic and atraumatic.     Right Ear: Hearing and external ear normal.     Left Ear: Hearing and external ear normal.     Nose: No nasal deformity or rhinorrhea.  Eyes:     General: No scleral icterus.    Conjunctiva/sclera: Conjunctivae normal.     Right eye: Right conjunctiva is not injected.     Left eye: Left conjunctiva is not injected.     Pupils: Pupils are equal, round, and reactive to light.  Neck:     Vascular: No JVD.  Cardiovascular:     Rate and Rhythm: Normal rate and regular rhythm.     Heart sounds: S1 normal and S2 normal.  Pulmonary:     Effort: Pulmonary effort is normal. No respiratory distress.     Breath sounds: No wheezing.  Abdominal:     General: Bowel sounds are normal. There is no distension.     Palpations: Abdomen is soft.     Tenderness: There is no abdominal tenderness.  Musculoskeletal:        General: Normal range of motion.     Right shoulder: Normal.     Left shoulder: Normal.     Cervical back: Normal range of motion and neck supple.     Right hip: Normal.     Left hip: Normal.  Right knee: Normal.     Left knee: Normal.  Lymphadenopathy:     Head:     Right side of head: No submandibular, preauricular or posterior auricular adenopathy.     Left side of head: No submandibular, preauricular or posterior auricular adenopathy.     Cervical: No cervical adenopathy.     Right cervical: No superficial or deep cervical adenopathy.    Left cervical: No superficial or deep cervical adenopathy.  Skin:    General: Skin is warm and dry.      Coloration: Skin is not pale.     Findings: No abrasion, bruising, ecchymosis, erythema, lesion or rash.     Nails: There is no clubbing.  Neurological:     General: No focal deficit present.     Mental Status: She is alert and oriented to person, place, and time.     Sensory: No sensory deficit.     Coordination: Coordination normal.     Gait: Gait normal.  Psychiatric:        Attention and Perception: She is attentive.        Mood and Affect: Mood normal.        Speech: Speech normal.        Behavior: Behavior normal. Behavior is cooperative.        Thought Content: Thought content normal.        Judgment: Judgment normal.           Assessment & Plan:   HSV 2 Recurrent Mollaret's meningitis:   Continue attempts at using 500 mg twice daily to prevent and 1 g twice daily to treat HSV meningitis.  In the this is not what the literature has supported but in her specific situation this approach has appeared to work.  Things are very complicated with the other multiple problems he is having in particular her migrainous headaches.  Migraine headaches follow-up with neurology  Problems with vaginal spotting and on implantable hormone therapy to follow-up with OB/GYN

## 2019-06-08 ENCOUNTER — Ambulatory Visit: Payer: 59 | Attending: Internal Medicine

## 2019-06-08 DIAGNOSIS — Z23 Encounter for immunization: Secondary | ICD-10-CM

## 2019-06-08 NOTE — Progress Notes (Signed)
   Covid-19 Vaccination Clinic  Name:  Angela Farrell    MRN: [id number] DOB: 1975/05/23  06/08/2019  [name] was observed post Covid-19 immunization for 15 minutes without incident. She was provided with Vaccine Information Sheet and instruction to access the V-Safe system.   [name] was instructed to call 911 with any severe reactions post vaccine: Marland Kitchen Difficulty breathing  . Swelling of face and throat  . A fast heartbeat  . A bad rash all over body  . Dizziness and weakness   Immunizations Administered    Name Date Dose VIS Date Route   Pfizer COVID-19 Vaccine 06/08/2019 11:02 AM 0.3 mL 03/14/2019 Intramuscular   Manufacturer: [employer], [employer]   Lot: [id number]   NDC: [address]-8

## 2019-06-19 ENCOUNTER — Encounter: Payer: Self-pay | Admitting: Emergency Medicine

## 2019-06-19 NOTE — Telephone Encounter (Signed)
I do not know this patient very well.  I have never seen her in person in the office.  Telemedicine visit once.  I do not feel comfortable prescribing medication to someone I barely know.  I will see her on Monday the 22nd.  Thanks.

## 2019-06-23 ENCOUNTER — Encounter: Payer: Self-pay | Admitting: Emergency Medicine

## 2019-06-23 ENCOUNTER — Other Ambulatory Visit: Payer: Self-pay

## 2019-06-23 ENCOUNTER — Ambulatory Visit: Payer: 59 | Admitting: Emergency Medicine

## 2019-06-23 VITALS — BP 149/104 | HR 99 | Temp 97.4°F | Resp 16 | Ht 63.0 in | Wt 199.4 lb

## 2019-06-23 DIAGNOSIS — M542 Cervicalgia: Secondary | ICD-10-CM

## 2019-06-23 DIAGNOSIS — M7918 Myalgia, other site: Secondary | ICD-10-CM | POA: Diagnosis not present

## 2019-06-23 DIAGNOSIS — M62838 Other muscle spasm: Secondary | ICD-10-CM | POA: Diagnosis not present

## 2019-06-23 MED ORDER — MELOXICAM 15 MG PO TABS: 15.0000 mg | ORAL_TABLET | Freq: Every day | ORAL | 1 refills | Status: AC

## 2019-06-23 MED ORDER — CYCLOBENZAPRINE HCL 10 MG PO TABS: 10.0000 mg | ORAL_TABLET | Freq: Every day | ORAL | 1 refills | Status: DC

## 2019-06-23 NOTE — Patient Instructions (Addendum)
   If you have lab work done today you will be contacted with your lab results within the next 2 weeks.  If you have not heard from us then please contact us. The fastest way to get your results is to register for My Chart.   IF you received an x-ray today, you will receive an invoice from [facility]. Please contact [facility] at [phone] with questions or concerns regarding your invoice.   IF you received labwork today, you will receive an invoice from [facility]. Please contact [facility] at [phone] with questions or concerns regarding your invoice.   Our billing staff will not be able to assist you with questions regarding bills from these companies.  You will be contacted with the lab results as soon as they are available. The fastest way to get your results is to activate your My Chart account. Instructions are located on the last page of this paperwork. If you have not heard from us regarding the results in 2 weeks, please contact this office.      Cervical Sprain  A cervical sprain is a stretch or tear in the tissues that connect bones (ligaments) in the neck. Most neck (cervical) sprains get better in 4-6 weeks. Follow these instructions at home: If you have a neck collar:  Wear it as told by your doctor. Do not take off (do not remove) the collar unless your doctor says that this is safe.  Ask your doctor before adjusting your collar.  If you have long hair, keep it outside of the collar.  Ask your doctor if you may take off the collar for cleaning and bathing. If you may take off the collar: ? Follow instructions from your doctor about how to take off the collar safely. ? Clean the collar by wiping it with mild soap and water. Let it air-dry all the way. ? If your collar has removable pads:  Take the pads out every 1-2 days.  Hand wash the pads with soap and water.  Let the pads air-dry all the way before you put them back in the collar. Do  not dry them in a clothes dryer. Do not dry them with a hair dryer. ? Check your skin under the collar for irritation or sores. If you see any, tell your doctor. Managing pain, stiffness, and swelling   Use a cervical traction device, if told by your doctor.  If told, put heat on the affected area. Do this before exercises (physical therapy) or as often as told by your doctor. Use the heat source that your doctor recommends, such as a moist heat pack or a heating pad. ? Place a towel between your skin and the heat source. ? Leave the heat on for 20-30 minutes. ? Take the heat off (remove the heat) if your skin turns bright red. This is very important if you cannot feel pain, heat, or cold. You may have a greater risk of getting burned.  Put ice on the affected area. ? Put ice in a plastic bag. ? Place a towel between your skin and the bag. ? Leave the ice on for 20 minutes, 2-3 times a day. Activity  Do not drive while wearing a neck collar. If you do not have a neck collar, ask your doctor if it is safe to drive.  Do not drive or use heavy machinery while taking prescription pain medicine or muscle relaxants, unless your doctor approves.  Do not lift anything that is   heavier than 10 lb (4.5 kg) until your doctor tells you that it is safe.  Rest as told by your doctor.  Avoid activities that make you feel worse. Ask your doctor what activities are safe for you.  Do exercises as told by your doctor or physical therapist. Preventing neck sprain  Practice good posture. Adjust your workstation to help with this, if needed.  Exercise regularly as told by your doctor or physical therapist.  Avoid activities that are risky or may cause a neck sprain (cervical sprain). General instructions  Take over-the-counter and prescription medicines only as told by your doctor.  Do not use any products that contain nicotine or tobacco. This includes cigarettes and e-cigarettes. If you need help  quitting, ask your doctor.  Keep all follow-up visits as told by your doctor. This is important. Contact a doctor if:  You have pain or other symptoms that get worse.  You have symptoms that do not get better after 2 weeks.  You have pain that does not get better with medicine.  You start to have new, unexplained symptoms.  You have sores or irritated skin from wearing your neck collar. Get help right away if:  You have very bad pain.  You have any of the following in any part of your body: ? Loss of feeling (numbness). ? Tingling. ? Weakness.  You cannot move a part of your body (you have paralysis).  Your activity level does not improve. Summary  A cervical sprain is a stretch or tear in the tissues that connect bones (ligaments) in the neck.  If you have a neck (cervical) collar, do not take off the collar unless your doctor says that this is safe.  Put ice on affected areas as told by your doctor.  Put heat on affected areas as told by your doctor.  Good posture and regular exercise can help prevent a neck sprain from happening again. This information is not intended to replace advice given to you by your health care provider. Make sure you discuss any questions you have with your health care provider. Document Revised: 07/10/2018 Document Reviewed: 11/30/2015 Elsevier Patient Education  2020 [employer]  

## 2019-06-23 NOTE — Progress Notes (Signed)
Angela Farrell 44 y.o.   Chief Complaint  Patient presents with  . Neck Pain    x 3 weeks pt complains spreads into her upper back, acupuncture has helped short term, but has been a constant ache, denies injury    HISTORY OF PRESENT ILLNESS: This is a 44 y.o. female complaining of right-sided neck pain for 3 weeks.  Denies injury. Saw chiropractor last Friday, had x-rays done, and acupuncture.  Still complaining of aching constant pain worse with movement.  Not associated with any other significant symptoms.  HPI   Prior to Admission medications   Medication Sig Start Date End Date Taking? Authorizing Provider  B Complex-C (SUPER B COMPLEX/VITAMIN C PO) Take by mouth.   Yes [provider]  Calcium-Magnesium-Vitamin D (CALCIUM 1200+D3 PO) Take 1,200 Units by mouth.   Yes [provider]  Cholecalciferol (D3-1000 PO) Take 2,000 Units by mouth.   Yes [provider]  Coenzyme Q10 (CO Q 10 PO) Take 50 mg by mouth daily.   Yes [provider]  etonogestrel (NEXPLANON) 68 MG IMPL implant Nexplanon 68 mg subdermal implant  Removal due 02/2019   Yes [provider]  Galcanezumab-gnlm (EMGALITY) 120 MG/ML SOAJ Inject 1 pen into the skin every 30 (thirty) days. 05/19/19  Yes Ward Givens, NP  GLUCOSAMINE-CHONDROITIN PO Take 1 tablet by mouth daily.   Yes [provider]  Lutein 40 MG CAPS Take 40 mg by mouth daily.   Yes [provider]  Magnesium 500 MG CAPS Take 500 mg by mouth 4 (four) times daily.   Yes [provider]  omeprazole (PRILOSEC) 40 MG capsule TAKE 1 CAPSULE BY MOUTH TWICE A DAY AS NEEDED BEFORE MEALS BY 30 MINTUES 03/11/18  Yes Shawnee Knapp, MD  Potassium Gluconate 595 MG CAPS Take 595 mg by mouth daily.   Yes [provider]  propranolol (INDERAL) 40 MG tablet Take 1 tablet (40 mg total) by mouth 2 (two) times daily. 12/10/17  Yes Penumalli, Vikram R, MD  rizatriptan (MAXALT-MLT) 10 MG  disintegrating tablet TAKE 1 TABLET BY MOUTH AS NEEDED FOR MIGRAINE. MAY REPEAT IN 2 HOURS IF NEEDED 02/17/19  Yes Penumalli, Vikram R, MD  topiramate (TOPAMAX) 100 MG tablet Take 1 tablet (100 mg total) by mouth 2 (two) times daily. 12/10/17  Yes Penumalli, Earlean Polka, MD  valACYclovir (VALTREX) 500 MG tablet Take 1 tablet (500 mg total) by mouth 2 (two) times daily. 06/04/19  Yes Tommy Medal, Lavell Islam, MD  vitamin A 8000 UNIT capsule Take 8,000 Units by mouth daily.   Yes [provider]  vitamin C (ASCORBIC ACID) 500 MG tablet Take 500 mg by mouth daily.   Yes [provider]    Allergies  Allergen Reactions  . Adhesive [Tape] Rash    Patient Active Problem List   Diagnosis Date Noted  . 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 Pityriasis versicolor 08/12/2018  . Migraine 06/20/2018  . Chronic frontal sinusitis 03/05/2017  . History of meningitis 11/03/2014  . Depression 11/03/2014  . Anxiety disorder 01/18/2012  . Chronic anemia 10/14/2011  . 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 Chronic headache disorder 04/18/2011  . Obesity 04/18/2011  . Chronic rhinitis 04/18/2011    Class: Chronic    Past Medical History:  Diagnosis Date  . Allergy   . Anemia   . Anxiety   . Arthritis   . 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 Chronic headache disorder  s/p Meningitis x 2, tension- type plus allergic/sinus problems  . History of abnormal Pap smear   . Meningitis, viral At age 15 and at age 55  . Nasal fracture 2011  . Other and unspecified ovarian cysts 2011- pelvic CT   GYN- Dr. Chevis Pretty  . Vaginal spotting 06/04/2019  . Vision abnormalities     Past Surgical History:  Procedure Laterality Date  . BACK SURGERY  02/09/15   L5 - S1 diskectomy  . FOOT SURGERY  s/p 2 procedures  . LAPAROSCOPY FOR ECTOPIC PREGNANCY  1998   Fallopian tube removed  . MOUTH SURGERY    . spinal injection  03/10/15  . SPINE SURGERY    . UTERINE ARTERY  EMBOLIZATION      Social History   Socioeconomic History  . Marital status: Married    Spouse name: Jesusita Oka  . Number of children: Not on file  . Years of education: Not on file  . Highest education level: Not on file  Occupational History  . Occupation: Chief Financial Officer: [employer]  Tobacco Use  . Smoking status: Former Smoker    Years: 20.00    Types: Cigarettes  . Smokeless tobacco: Never Used  Substance and Sexual Activity  . Alcohol use: Yes    Alcohol/week: 1.0 standard drinks    Types: 1 Standard drinks or equivalent per week  . Drug use: Yes    Types: Marijuana    Comment: per pt "few times a year"  . Sexual activity: Yes    Partners: Male    Birth control/protection: Implant    Comment: sexually active w/ female husband  Other Topics Concern  . Not on file  Social History Narrative   Drinks about 12-16oz of coffee a day, occasionally drinks a tea.   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    Social Determinants of Health   Financial Resource Strain:   . Difficulty of Paying Living Expenses:   Food Insecurity:   . Worried About Programme researcher, broadcasting/film/video in the Last Year:   . Barista in the Last Year:   Transportation Needs:   . Freight forwarder (Medical):   Marland Kitchen Lack of Transportation (Non-Medical):   Physical Activity:   . Days of Exercise per Week:   . Minutes of Exercise per Session:   Stress:   . Feeling of Stress :   Social Connections:   . Frequency of Communication with Friends and Family:   . Frequency of Social Gatherings with Friends and Family:   . Attends Religious Services:   . Active Member of Clubs or Organizations:   . Attends Banker Meetings:   Marland Kitchen Marital Status:   Intimate Partner Violence:   . Fear of Current or Ex-Partner:   . Emotionally Abused:   Marland Kitchen Physically Abused:   . Sexually Abused:     Family History  Problem Relation Age of Onset  . Migraines Father        cluster headache  . Hyperlipidemia Father    . Hypertension Father   . Cancer Mother 55       Breast  . Stroke Maternal Grandmother   . Diabetes Paternal Grandmother   . Diabetes Unknown        paternal relaitve  . Seizures Unknown        maternal relative  . Rheum arthritis Unknown        maternal relatives     Review of  Systems  Constitutional: Negative.  Negative for chills and fever.  HENT: Negative.  Negative for congestion and sore throat.   Respiratory: Negative.  Negative for cough and shortness of breath.   Cardiovascular: Negative.  Negative for chest pain and palpitations.  Gastrointestinal: Negative.  Negative for abdominal pain, blood in stool, diarrhea, melena, nausea and vomiting.  Genitourinary: Negative.  Negative for dysuria and hematuria.  Musculoskeletal: Positive for neck pain.  Skin: Negative.  Negative for rash.  Neurological: Negative.  Negative for dizziness and headaches.  Endo/Heme/Allergies: Negative.   All other systems reviewed and are negative.  Today's Vitals   06/23/19 1502  BP: (!) 149/104  Pulse: 99  Resp: 16  Temp: (!) 97.4 F (36.3 C)  TempSrc: Temporal  SpO2: 100%  Weight: 199 lb 6.4 oz (90.4 kg)  Height: 5\' 3"  (1.6 m)   Body mass index is 35.32 kg/m.   Physical Exam Vitals reviewed.  Constitutional:      Appearance: Normal appearance.  HENT:     Head: Normocephalic.  Eyes:     Extraocular Movements: Extraocular movements intact.     Conjunctiva/sclera: Conjunctivae normal.     Pupils: Pupils are equal, round, and reactive to light.  Neck:     Thyroid: No thyroid mass or thyroid tenderness.     Vascular: No carotid bruit.  Musculoskeletal:     Cervical back: Pain with movement and muscular tenderness (Right-sided) present. No spinous process tenderness. Decreased range of motion.  Lymphadenopathy:     Cervical: No cervical adenopathy.     Right cervical: No superficial cervical adenopathy.    Left cervical: No superficial cervical adenopathy.  Neurological:       Mental Status: She is alert.      ASSESSMENT & PLAN: Lovell was seen today for neck pain.  Diagnoses and all orders for this visit:  Neck pain  Musculoskeletal pain -     meloxicam (MOBIC) 15 MG tablet; Take 1 tablet (15 mg total) by mouth daily for 10 days.  Muscle spasm -     cyclobenzaprine (FLEXERIL) 10 MG tablet; Take 1 tablet (10 mg total) by mouth at bedtime.    Patient Instructions       If you have lab work done today you will be contacted with your lab results within the next 2 weeks.  If you have not heard from Velna Hatchet then please contact us. The fastest way to get your results is to register for My Chart.   IF you received an x-ray today, you will receive an invoice from [facility]. Please contact [facility] at [phone] with questions or concerns regarding your invoice.   IF you received labwork today, you will receive an invoice from [place]. Please contact [facility] at [phone] with questions or concerns regarding your invoice.   Our billing staff will not be able to assist you with questions regarding bills from these companies.  You will be contacted with the lab results as soon as they are available. The fastest way to get your results is to activate your My Chart account. Instructions are located on the last page of this paperwork. If you have not heard from [phone] regarding the results in 2 weeks, please contact this office.     Cervical Sprain  A cervical sprain is a stretch or tear in the tissues that connect bones (ligaments) in the neck. Most neck (cervical) sprains get better in 4-6 weeks. Follow these instructions at home: If you have a neck  collar:  Wear it as told by your doctor. Do not take off (do not remove) the collar unless your doctor says that this is safe.  Ask your doctor before adjusting your collar.  If you have long hair, keep it outside of the collar.  Ask your doctor if you may take off the collar for  cleaning and bathing. If you may take off the collar: ? Follow instructions from your doctor about how to take off the collar safely. ? Clean the collar by wiping it with mild soap and water. Let it air-dry all the way. ? If your collar has removable pads:  Take the pads out every 1-2 days.  Hand wash the pads with soap and water.  Let the pads air-dry all the way before you put them back in the collar. Do not dry them in a clothes dryer. Do not dry them with a hair dryer. ? Check your skin under the collar for irritation or sores. If you see any, tell your doctor. Managing pain, stiffness, and swelling   Use a cervical traction device, if told by your doctor.  If told, put heat on the affected area. Do this before exercises (physical therapy) or as often as told by your doctor. Use the heat source that your doctor recommends, such as a moist heat pack or a heating pad. ? Place a towel between your skin and the heat source. ? Leave the heat on for 20-30 minutes. ? Take the heat off (remove the heat) if your skin turns bright red. This is very important if you cannot feel pain, heat, or cold. You may have a greater risk of getting burned.  Put ice on the affected area. ? Put ice in a plastic bag. ? Place a towel between your skin and the bag. ? Leave the ice on for 20 minutes, 2-3 times a day. Activity  Do not drive while wearing a neck collar. If you do not have a neck collar, ask your doctor if it is safe to drive.  Do not drive or use heavy machinery while taking prescription pain medicine or muscle relaxants, unless your doctor approves.  Do not lift anything that is heavier than 10 lb (4.5 kg) until your doctor tells you that it is safe.  Rest as told by your doctor.  Avoid activities that make you feel worse. Ask your doctor what activities are safe for you.  Do exercises as told by your doctor or physical therapist. Preventing neck sprain  Practice good posture. Adjust  your workstation to help with this, if needed.  Exercise regularly as told by your doctor or physical therapist.  Avoid activities that are risky or may cause a neck sprain (cervical sprain). General instructions  Take over-the-counter and prescription medicines only as told by your doctor.  Do not use any products that contain nicotine or tobacco. This includes cigarettes and e-cigarettes. If you need help quitting, ask your doctor.  Keep all follow-up visits as told by your doctor. This is important. Contact a doctor if:  You have pain or other symptoms that get worse.  You have symptoms that do not get better after 2 weeks.  You have pain that does not get better with medicine.  You start to have new, unexplained symptoms.  You have sores or irritated skin from wearing your neck collar. Get help right away if:  You have very bad pain.  You have any of the following in any part of your  body: ? Loss of feeling (numbness). ? Tingling. ? Weakness.  You cannot move a part of your body (you have paralysis).  Your activity level does not improve. Summary  A cervical sprain is a stretch or tear in the tissues that connect bones (ligaments) in the neck.  If you have a neck (cervical) collar, do not take off the collar unless your doctor says that this is safe.  Put ice on affected areas as told by your doctor.  Put heat on affected areas as told by your doctor.  Good posture and regular exercise can help prevent a neck sprain from happening again. This information is not intended to replace advice given to you by your health care provider. Make sure you discuss any questions you have with your health care provider. Document Revised: 07/10/2018 Document Reviewed: 11/30/2015 Elsevier Patient Education  2020 Elsevier Inc.      Edwina Barth, MD [facility]

## 2019-06-26 ENCOUNTER — Encounter: Payer: Self-pay | Admitting: *Deleted

## 2019-07-08 ENCOUNTER — Ambulatory Visit: Payer: 59 | Attending: Internal Medicine

## 2019-07-08 DIAGNOSIS — Z23 Encounter for immunization: Secondary | ICD-10-CM

## 2019-07-08 NOTE — Progress Notes (Signed)
   Covid-19 Vaccination Clinic  Name:  Angela Farrell    MRN: [id number] DOB: [birth year]  07/08/2019  Angela Farrell was observed post Covid-19 immunization for 15 minutes without incident. She was provided with Vaccine Information Sheet and instruction to access the V-Safe system.   Angela Farrell was instructed to call 911 with any severe reactions post vaccine: Marland Kitchen Difficulty breathing  . Swelling of face and throat  . A fast heartbeat  . A bad rash all over body  . Dizziness and weakness   Immunizations Administered    Name Date Dose VIS Date Route   Pfizer COVID-19 Vaccine 07/08/2019 11:15 AM 0.3 mL 03/14/2019 Intramuscular   Manufacturer: [employer], [employer]   Lot: [id number]   NDC: [address]-9

## 2019-08-17 ENCOUNTER — Other Ambulatory Visit: Payer: Self-pay | Admitting: Emergency Medicine

## 2019-08-17 DIAGNOSIS — M62838 Other muscle spasm: Secondary | ICD-10-CM

## 2019-08-17 NOTE — Telephone Encounter (Signed)
Requested medications are due for refill today?  Yes - This medication  refill ccannot be delegated.    Requested medications are on active medication list?  Yes  Last Refill:   06/23/2019   # 30 with 1 refill   Future visit scheduled?  No   Notes to Clinic:  This medication refill cannot be delegated.

## 2019-08-18 ENCOUNTER — Other Ambulatory Visit: Payer: Self-pay

## 2019-08-18 ENCOUNTER — Ambulatory Visit: Payer: 59 | Admitting: Adult Health

## 2019-08-18 ENCOUNTER — Encounter: Payer: Self-pay | Admitting: Adult Health

## 2019-08-18 VITALS — BP 134/84 | HR 101 | Temp 97.2°F | Ht 63.0 in | Wt 200.0 lb

## 2019-08-18 DIAGNOSIS — G43009 Migraine without aura, not intractable, without status migrainosus: Secondary | ICD-10-CM | POA: Diagnosis not present

## 2019-08-18 NOTE — Telephone Encounter (Signed)
Patient is requesting a refill of the following medications: Requested Prescriptions   Pending Prescriptions Disp Refills  . cyclobenzaprine (FLEXERIL) 10 MG tablet [Pharmacy Med Name: CYCLOBENZAPRINE 10 MG TABLET] 30 tablet 1    Sig: TAKE 1 TABLET BY MOUTH EVERYDAY AT BEDTIME    Date of patient request: 08/17/2019 Last office visit: 06/23/2019 Date of last refill: 06/23/2019 Last refill amount: 30 Tablets 1 refill Follow up time period per chart: N/A

## 2019-08-18 NOTE — Progress Notes (Signed)
PATIENT: Angela Farrell DOB: 1975-08-17  REASON FOR VISIT: follow up HISTORY FROM: patient  HISTORY OF PRESENT ILLNESS: Today 08/18/19: Angela Farrell is a 44 year old female with a history of migraine headaches.  She returns today for follow-up.  She reports that Emgality has been beneficial for her headaches.  Reports that she typically has maybe 1 severe headache a month.  She has a mild headache on a daily basis.  This seems to be exacerbated by the left neck pain that has been ongoing.  Reports that she is seeing a chiropractor and her PCP started Flexeril for her.  She continues on Topamax but stopped propranolol.  She feels that her headaches are under relatively good control at the time.   HISTORY 02/17/19: Angela Farrell is a 44 year old female with a history of migraine headaches.  She returns today for follow-up.  She states that she has not had Emgality since July.  She did not realize it had been this long.  She is not sure why she was not notified by her pharmacy that her prescription was ready.  She states that her migraines have been worse off of Emgality.  She states that a couple weeks ago she had a migraine that lasted 1 week.  She also states that she has noticed some neck soreness and stiffness.  She states that it seems worse after she is has slept.  She denies any pain down the arms.  No numbness or tingling down the arms.  She does work at a computer all day long.  She returns today for an evaluation.  REVIEW OF SYSTEMS: Out of a complete 14 system review of symptoms, the patient complains only of the following symptoms, and all other reviewed systems are negative.   See HPI  ALLERGIES: Allergies  Allergen Reactions  . Adhesive [Tape] Rash    HOME MEDICATIONS: Outpatient Medications Prior to Visit  Medication Sig Dispense Refill  . B Complex-C (SUPER B COMPLEX/VITAMIN C PO) Take by mouth.    . Calcium-Magnesium-Vitamin D (CALCIUM 1200+D3 PO) Take 1,200 Units by  mouth.    . Cholecalciferol (D3-1000 PO) Take 2,000 Units by mouth.    . Coenzyme Q10 (CO Q 10 PO) Take 50 mg by mouth daily.    . cyclobenzaprine (FLEXERIL) 10 MG tablet Take 1 tablet (10 mg total) by mouth at bedtime. 30 tablet 1  . etonogestrel (NEXPLANON) 68 MG IMPL implant Nexplanon 68 mg subdermal implant  Removal Farrell 02/2019    . Galcanezumab-gnlm (EMGALITY) 120 MG/ML SOAJ Inject 1 pen into the skin every 30 (thirty) days. 1 pen 5  . GLUCOSAMINE-CHONDROITIN PO Take 1 tablet by mouth daily.    . Lutein 40 MG CAPS Take 40 mg by mouth daily.    . Magnesium 500 MG CAPS Take 500 mg by mouth 4 (four) times daily.    Marland Kitchen omeprazole (PRILOSEC) 40 MG capsule TAKE 1 CAPSULE BY MOUTH TWICE A DAY AS NEEDED BEFORE MEALS BY 30 MINTUES 180 capsule 3  . Potassium Gluconate 595 MG CAPS Take 595 mg by mouth daily.    . propranolol (INDERAL) 40 MG tablet Take 1 tablet (40 mg total) by mouth 2 (two) times daily. 60 tablet 12  . rizatriptan (MAXALT-MLT) 10 MG disintegrating tablet TAKE 1 TABLET BY MOUTH AS NEEDED FOR MIGRAINE. MAY REPEAT IN 2 HOURS IF NEEDED 9 tablet 11  . topiramate (TOPAMAX) 100 MG tablet Take 1 tablet (100 mg total) by mouth 2 (two) times daily. 60  tablet 12  . valACYclovir (VALTREX) 500 MG tablet Take 1 tablet (500 mg total) by mouth 2 (two) times daily. 60 tablet 11  . vitamin A 8000 UNIT capsule Take 8,000 Units by mouth daily.    . vitamin C (ASCORBIC ACID) 500 MG tablet Take 500 mg by mouth daily.     No facility-administered medications prior to visit.    PAST MEDICAL HISTORY: Past Medical History:  Diagnosis Date  . Allergy   . Anemia   . Anxiety   . Arthritis   . 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 Chronic headache disorder    s/p Meningitis x 2, tension- type plus allergic/sinus problems  . History of abnormal Pap smear   . Meningitis, viral At age 34 and at age 69  . Nasal fracture 2011  . Other and unspecified ovarian cysts 2011- pelvic CT   GYN- Dr.  Chevis Pretty  . Vaginal spotting 06/04/2019  . Vision abnormalities     PAST SURGICAL HISTORY: Past Surgical History:  Procedure Laterality Date  . BACK SURGERY  02/09/15   L5 - S1 diskectomy  . FOOT SURGERY  s/p 2 procedures  . LAPAROSCOPY FOR ECTOPIC PREGNANCY  1998   Fallopian tube removed  . MOUTH SURGERY    . spinal injection  03/10/15  . SPINE SURGERY    . UTERINE ARTERY EMBOLIZATION      FAMILY HISTORY: Family History  Problem Relation Age of Onset  . Migraines Father        cluster headache  . Hyperlipidemia Father   . Hypertension Father   . Cancer Mother 43       Breast  . Stroke Maternal Grandmother   . Diabetes Paternal Grandmother   . Diabetes Unknown        paternal relaitve  . Seizures Unknown        maternal relative  . Rheum arthritis Unknown        maternal relatives    SOCIAL HISTORY: Social History   Socioeconomic History  . Marital status: Married    Spouse name: Jesusita Oka  . Number of children: Not on file  . Years of education: Not on file  . Highest education level: Not on file  Occupational History  . Occupation: Chief Financial Officer: [employer]  Tobacco Use  . Smoking status: Former Smoker    Years: 20.00    Types: Cigarettes  . Smokeless tobacco: Never Used  Substance and Sexual Activity  . Alcohol use: Yes    Alcohol/week: 1.0 standard drinks    Types: 1 Standard drinks or equivalent per week  . Drug use: Yes    Types: Marijuana    Comment: per pt "few times a year"  . Sexual activity: Yes    Partners: Male    Birth control/protection: Implant    Comment: sexually active w/ female husband  Other Topics Concern  . Not on file  Social History Narrative   Drinks about 12-16oz of coffee a day, occasionally drinks a tea.   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    Social Determinants of Health   Financial Resource Strain:   . Difficulty of Paying Living Expenses:   Food Insecurity:   . Worried About Programme researcher, broadcasting/film/video in the Last Year:    . Barista in the Last Year:   Transportation Needs:   . Freight forwarder (Medical):   Marland Kitchen Lack of Transportation (Non-Medical):   Physical Activity:   .  Days of Exercise per Week:   . Minutes of Exercise per Session:   Stress:   . Feeling of Stress :   Social Connections:   . Frequency of Communication with Friends and Family:   . Frequency of Social Gatherings with Friends and Family:   . Attends Religious Services:   . Active Member of Clubs or Organizations:   . Attends Archivist Meetings:   Marland Kitchen Marital Status:   Intimate Partner Violence:   . Fear of Current or Ex-Partner:   . Emotionally Abused:   Marland Kitchen Physically Abused:   . Sexually Abused:       PHYSICAL EXAM  Vitals:   08/18/19 1426  BP: 134/84  Pulse: (!) 101  Temp: (!) 97.2 F (36.2 C)  Weight: 200 lb (90.7 kg)  Height: 5\' 3"  (1.6 m)   Body mass index is 35.43 kg/m.  Generalized: Well developed, in no acute distress   Neurological examination  Mentation: Alert oriented to time, place, history taking. Follows all commands speech and language fluent Cranial nerve II-XII: Pupils were equal round reactive to light. Extraocular movements were full, visual field were full on confrontational test.  Head turning and shoulder shrug  were normal and symmetric. Motor: The motor testing reveals 5 over 5 strength of all 4 extremities. Good symmetric motor tone is noted throughout.  Sensory: Sensory testing is intact to soft touch on all 4 extremities. No evidence of extinction is noted.  Coordination: Cerebellar testing reveals good finger-nose-finger and heel-to-shin bilaterally.  Gait and station: Gait is normal.   Reflexes: Deep tendon reflexes are symmetric and normal bilaterally.   DIAGNOSTIC DATA (LABS, IMAGING, TESTING) - I reviewed patient records, labs, notes, testing and imaging myself where available.  Lab Results  Component Value Date   WBC 4.7 02/15/2018   HGB 12.7 02/15/2018    HCT 39.5 02/15/2018   MCV 91 02/15/2018   PLT 345 02/15/2018      Component Value Date/Time   NA 140 02/21/2018 1106   K 4.1 02/21/2018 1106   CL 107 (H) 02/21/2018 1106   CO2 20 02/21/2018 1106   GLUCOSE 89 02/21/2018 1106   GLUCOSE 100 (H) 01/09/2017 1146   BUN 9 02/21/2018 1106   CREATININE 0.95 02/21/2018 1106   CREATININE 0.78 12/21/2016 0855   CALCIUM 8.8 02/21/2018 1106   PROT 6.9 02/21/2018 1106   ALBUMIN 4.3 02/21/2018 1106   AST 31 02/21/2018 1106   ALT 50 (H) 02/21/2018 1106   ALKPHOS 51 02/21/2018 1106   BILITOT 0.6 02/21/2018 1106   GFRNONAA 73 02/21/2018 1106   GFRNONAA >89 10/14/2014 0944   GFRAA 84 02/21/2018 1106   GFRAA >89 10/14/2014 0944   Lab Results  Component Value Date   CHOL 226 (H) 02/15/2018   HDL 50 02/15/2018   LDLCALC 147 (H) 02/15/2018   TRIG 143 02/15/2018   CHOLHDL 4.5 (H) 02/15/2018   Lab Results  Component Value Date   HGBA1C 4.8 02/15/2018   No results found for: [id number] Lab Results  Component Value Date   TSH 0.847 02/15/2018      ASSESSMENT AND PLAN 44 y.o. year old female  has a past medical history of Allergy, Anemia, Anxiety, Arthritis, Benign recurrent meningitis (mollaret) (11/18/2018), Chronic headache disorder, History of abnormal Pap smear, Meningitis, viral (At age 17 and at age 26), Nasal fracture (2011), Other and unspecified ovarian cysts (2011- pelvic CT), Vaginal spotting (06/04/2019), and Vision abnormalities. here with:  1.  Migraine headaches  -  Continue Emgality and Topamax -Advised if headache frequency increases she should let us know  -Follow-up in 6 months or sooner if needed   I spent 20 minutes of face-to-face and non-face-to-face time with patient.  This included previsit chart review, lab review, study review, order entry, electronic health record documentation, patient education.  Butch Penny, MSN, NP-C 08/18/2019, 2:28 PM [facility] [address] [place],  [place] [address] [phone]

## 2019-08-18 NOTE — Patient Instructions (Signed)
Your Plan:  Continue Emgality and topamax If your symptoms worsen or you develop new symptoms please let us know.   Thank you for coming to see [place] at [facility]. I hope we have been able to provide you high quality care today.  You may receive a patient satisfaction survey over the next few weeks. We would appreciate your feedback and comments so that we may continue to improve ourselves and the health of our patients.

## 2019-08-21 ENCOUNTER — Telehealth: Payer: Self-pay | Admitting: *Deleted

## 2019-08-21 ENCOUNTER — Encounter: Payer: Self-pay | Admitting: *Deleted

## 2019-08-21 NOTE — Telephone Encounter (Signed)
Emgality PA, key: [id number], [id number]. she has been on topamax, rizatriptan since 2016-17 but not with 100% effectiveness. Was on emgality then off for a while. She has done better on Emgality.  [facility] is reviewing your PA request. Typically an electronic response will be received within 72 hours. To check for an update later, open this request from your dashboar

## 2019-08-21 NOTE — Telephone Encounter (Signed)
Approved today. Request Reference Number: [id number]. EMGALITY INJ 120MG /ML is approved through 08/20/2020. Your patient may now fill this prescription and it will be covered. Notified her via my chart.  Approval faxed to [facility], [facility].

## 2019-08-25 ENCOUNTER — Other Ambulatory Visit: Payer: Self-pay

## 2019-08-25 ENCOUNTER — Encounter: Payer: Self-pay | Admitting: Adult Health

## 2019-08-25 MED ORDER — TOPIRAMATE 100 MG PO TABS: 100.0000 mg | ORAL_TABLET | Freq: Two times a day (BID) | ORAL | 12 refills | Status: DC

## 2019-10-15 ENCOUNTER — Other Ambulatory Visit: Payer: Self-pay | Admitting: Emergency Medicine

## 2019-10-15 DIAGNOSIS — M62838 Other muscle spasm: Secondary | ICD-10-CM

## 2019-10-15 NOTE — Telephone Encounter (Signed)
Requested medications are due for refill today?  Yes - This refill cannot be delegated.    Requested medications are on active medication list?  Yes  Last Refill:   08/18/2019  # 30 with one refill   Future visit scheduled?  No   Notes to Clinic:  This Refill cannot be delegated.

## 2019-11-13 ENCOUNTER — Other Ambulatory Visit: Payer: Self-pay | Admitting: Adult Health

## 2019-12-14 ENCOUNTER — Other Ambulatory Visit: Payer: Self-pay | Admitting: Emergency Medicine

## 2019-12-14 DIAGNOSIS — M62838 Other muscle spasm: Secondary | ICD-10-CM

## 2019-12-14 NOTE — Telephone Encounter (Signed)
Requested medications are due for refill today? Yes - This medication refill cannot be delegated.   Requested medications are on active medication list? Yes  Last Refill:   10/15/2019  # 30 with one refill.    Future visit scheduled?  No   Notes to Clinic:  This medication refill cannot be delegated.

## 2019-12-15 NOTE — Telephone Encounter (Signed)
[facility] refill sent in for pt's cyclobenzaprine.

## 2020-01-29 ENCOUNTER — Ambulatory Visit: Payer: 59 | Admitting: Emergency Medicine

## 2020-01-29 ENCOUNTER — Encounter: Payer: Self-pay | Admitting: Emergency Medicine

## 2020-01-29 ENCOUNTER — Other Ambulatory Visit: Payer: Self-pay

## 2020-01-29 ENCOUNTER — Other Ambulatory Visit: Payer: Self-pay | Admitting: Emergency Medicine

## 2020-01-29 VITALS — BP 114/77 | HR 102 | Temp 98.0°F | Resp 16 | Ht 63.0 in | Wt 207.0 lb

## 2020-01-29 DIAGNOSIS — M549 Dorsalgia, unspecified: Secondary | ICD-10-CM | POA: Diagnosis not present

## 2020-01-29 DIAGNOSIS — M62838 Other muscle spasm: Secondary | ICD-10-CM

## 2020-01-29 MED ORDER — CYCLOBENZAPRINE HCL 10 MG PO TABS: ORAL_TABLET | ORAL | 1 refills | Status: DC

## 2020-01-29 NOTE — Progress Notes (Signed)
Angela HillockSheila Carmon Farrell 44 y.o.   Chief Complaint  Patient presents with  . Medication Refill    Felexeril  . Back Pain    per patient on the upper area of back to the neck for several weeks    HISTORY OF PRESENT ILLNESS: This is a 44 y.o. female complaining of upper back pain with spasms requesting refill of Flexeril.  On and off for several weeks.  Denies injury.  No other significant associated symptoms.  HPI   Prior to Admission medications   Medication Sig Start Date End Date Taking? Authorizing Provider  B Complex-C (SUPER B COMPLEX/VITAMIN C PO) Take by mouth.   Yes [provider]  Calcium-Magnesium-Vitamin D (CALCIUM 1200+D3 PO) Take 1,200 Units by mouth.   Yes [provider]  Cholecalciferol (D3-1000 PO) Take 2,000 Units by mouth.   Yes [provider]  Coenzyme Q10 (CO Q 10 PO) Take 50 mg by mouth daily.   Yes [provider]  cyclobenzaprine (FLEXERIL) 10 MG tablet TAKE 1 TABLET BY MOUTH EVERYDAY AT BEDTIME 01/29/20  Yes Zalan Shidler, [place] Village[place], MD  EMGALITY 120 MG/ML SOAJ INJECT 1 PEN INTO THE SKIN EVERY 30 (THIRTY) DAYS. 11/13/19  Yes Butch PennyMillikan, Megan, NP  etonogestrel (NEXPLANON) 68 MG IMPL implant Nexplanon 68 mg subdermal implant  Removal due 02/2019   Yes [provider]  GLUCOSAMINE-CHONDROITIN PO Take 1 tablet by mouth daily.   Yes [provider]  Lutein 40 MG CAPS Take 40 mg by mouth daily.   Yes [provider]  Magnesium 500 MG CAPS Take 500 mg by mouth 4 (four) times daily.   Yes [provider]  omeprazole (PRILOSEC) 40 MG capsule TAKE 1 CAPSULE BY MOUTH TWICE A DAY AS NEEDED BEFORE MEALS BY 30 MINTUES 03/11/18  Yes Sherren MochaShaw, Eva N, MD  Potassium Gluconate 595 MG CAPS Take 595 mg by mouth daily.   Yes [provider]  rizatriptan (MAXALT-MLT) 10 MG disintegrating tablet TAKE 1 TABLET BY MOUTH AS NEEDED FOR MIGRAINE. MAY REPEAT IN 2 HOURS IF NEEDED 02/17/19  Yes Penumalli, Vikram R, MD    topiramate (TOPAMAX) 100 MG tablet Take 1 tablet (100 mg total) by mouth 2 (two) times daily. 08/25/19  Yes Butch PennyMillikan, Megan, NP  valACYclovir (VALTREX) 500 MG tablet Take 1 tablet (500 mg total) by mouth 2 (two) times daily. 06/04/19  Yes Daiva EvesVan Dam, Lisette Grinderornelius N, MD  vitamin A 8000 UNIT capsule Take 8,000 Units by mouth daily.   Yes [provider]  vitamin C (ASCORBIC ACID) 500 MG tablet Take 500 mg by mouth daily.   Yes [provider]    Allergies  Allergen Reactions  . Adhesive [Tape] Rash    Patient Active Problem List   Diagnosis Date Noted  . 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 Pityriasis versicolor 08/12/2018  . Migraine 06/20/2018  . Chronic frontal sinusitis 03/05/2017  . History of meningitis 11/03/2014  . Depression 11/03/2014  . Anxiety disorder 01/18/2012  . Chronic anemia 10/14/2011  . 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 Chronic headache disorder 04/18/2011  . Obesity 04/18/2011  . Chronic rhinitis 04/18/2011    Class: Chronic    Past Medical History:  Diagnosis Date  . Allergy   . Anemia   . Anxiety   . Arthritis   . 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 Chronic headache disorder    s/p Meningitis x 2, tension- type plus allergic/sinus problems  . History of abnormal Pap smear   .  Meningitis, viral At age 7 and at age 68  . Nasal fracture 2011  . Other and unspecified ovarian cysts 2011- pelvic CT   GYN- Dr. Chevis Pretty  . Vaginal spotting 06/04/2019  . Vision abnormalities     Past Surgical History:  Procedure Laterality Date  . BACK SURGERY  02/09/15   L5 - S1 diskectomy  . FOOT SURGERY  s/p 2 procedures  . LAPAROSCOPY FOR ECTOPIC PREGNANCY  1998   Fallopian tube removed  . MOUTH SURGERY    . spinal injection  03/10/15  . SPINE SURGERY    . UTERINE ARTERY EMBOLIZATION      Social History   Socioeconomic History  . Marital status: Married    Spouse name: Jesusita Oka  . Number of children: Not on file  .  Years of education: Not on file  . Highest education level: Not on file  Occupational History  . Occupation: Chief Financial Officer: [employer]  Tobacco Use  . Smoking status: Former Smoker    Years: 20.00    Types: Cigarettes  . Smokeless tobacco: Never Used  Vaping Use  . Vaping Use: Never used  Substance and Sexual Activity  . Alcohol use: Yes    Alcohol/week: 1.0 standard drink    Types: 1 Standard drinks or equivalent per week  . Drug use: Yes    Types: Marijuana    Comment: per pt "few times a year"  . Sexual activity: Yes    Partners: Male    Birth control/protection: Implant    Comment: sexually active w/ female husband  Other Topics Concern  . Not on file  Social History Narrative   Drinks about 12-16oz of coffee a day, occasionally drinks a tea.   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    Social Determinants of Health   Financial Resource Strain:   . Difficulty of Paying Living Expenses: Not on file  Food Insecurity:   . Worried About Programme researcher, broadcasting/film/video in the Last Year: Not on file  . Ran Out of Food in the Last Year: Not on file  Transportation Needs:   . Lack of Transportation (Medical): Not on file  . Lack of Transportation (Non-Medical): Not on file  Physical Activity:   . Days of Exercise per Week: Not on file  . Minutes of Exercise per Session: Not on file  Stress:   . Feeling of Stress : Not on file  Social Connections:   . Frequency of Communication with Friends and Family: Not on file  . Frequency of Social Gatherings with Friends and Family: Not on file  . Attends Religious Services: Not on file  . Active Member of Clubs or Organizations: Not on file  . Attends Banker Meetings: Not on file  . Marital Status: Not on file  Intimate Partner Violence:   . Fear of Current or Ex-Partner: Not on file  . Emotionally Abused: Not on file  . Physically Abused: Not on file  . Sexually Abused: Not on file    Family History  Problem  Relation Age of Onset  . Migraines Father        cluster headache  . Hyperlipidemia Father   . Hypertension Father   . Cancer Mother 73       Breast  . Stroke Maternal Grandmother   . Diabetes Paternal Grandmother   . Diabetes Unknown        paternal relaitve  . Seizures Unknown  maternal relative  . Rheum arthritis Unknown        maternal relatives     Review of Systems  Constitutional: Negative.  Negative for chills and fever.  HENT: Negative.  Negative for congestion and sore throat.   Respiratory: Negative.  Negative for cough and shortness of breath.   Cardiovascular: Negative.  Negative for chest pain and palpitations.  Gastrointestinal: Negative.  Negative for abdominal pain, diarrhea, nausea and vomiting.  Genitourinary: Negative.  Negative for dysuria and hematuria.  Musculoskeletal: Positive for back pain and neck pain. Negative for joint pain.  Skin: Negative.  Negative for rash.  Neurological: Negative.  Negative for dizziness and headaches.  All other systems reviewed and are negative.    Physical Exam Vitals reviewed.  Constitutional:      Appearance: Normal appearance.  HENT:     Head: Normocephalic.  Eyes:     Extraocular Movements: Extraocular movements intact.     Pupils: Pupils are equal, round, and reactive to light.  Cardiovascular:     Rate and Rhythm: Normal rate and regular rhythm.     Pulses: Normal pulses.     Heart sounds: Normal heart sounds.  Pulmonary:     Effort: Pulmonary effort is normal.     Breath sounds: Normal breath sounds.  Musculoskeletal:     Cervical back: Normal range of motion and neck supple.     Comments: Positive tenderness and muscle spasm to upper back muscles and trapezius muscles.  Skin:    General: Skin is warm and dry.     Capillary Refill: Capillary refill takes less than 2 seconds.  Neurological:     General: No focal deficit present.     Mental Status: She is alert and oriented to person, place, and  time.      ASSESSMENT & PLAN: Elesa was seen today for medication refill and back pain.  Diagnoses and all orders for this visit:  Musculoskeletal back pain  Muscle spasm -     cyclobenzaprine (FLEXERIL) 10 MG tablet; TAKE 1 TABLET BY MOUTH EVERYDAY AT BEDTIME    Patient Instructions  Health Maintenance, Female Adopting a healthy lifestyle and getting preventive care are important in promoting health and wellness. Ask your health care provider about:  The right schedule for you to have regular tests and exams.  Things you can do on your own to prevent diseases and keep yourself healthy. What should I know about diet, weight, and exercise? Eat a healthy diet   Eat a diet that includes plenty of vegetables, fruits, low-fat dairy products, and lean protein.  Do not eat a lot of foods that are high in solid fats, added sugars, or sodium. Maintain a healthy weight Body mass index (BMI) is used to identify weight problems. It estimates body fat based on height and weight. Your health care provider can help determine your BMI and help you achieve or maintain a healthy weight. Get regular exercise Get regular exercise. This is one of the most important things you can do for your health. Most adults should:  Exercise for at least 150 minutes each week. The exercise should increase your heart rate and make you sweat (moderate-intensity exercise).  Do strengthening exercises at least twice a week. This is in addition to the moderate-intensity exercise.  Spend less time sitting. Even light physical activity can be beneficial. Watch cholesterol and blood lipids Have your blood tested for lipids and cholesterol at 44 years of age, then have this test every  5 years. Have your cholesterol levels checked more often if:  Your lipid or cholesterol levels are high.  You are older than 46 years of age.  You are at high risk for heart disease. What should I know about cancer  screening? Depending on your health history and family history, you may need to have cancer screening at various ages. This may include screening for:  Breast cancer.  Cervical cancer.  Colorectal cancer.  Skin cancer.  Lung cancer. What should I know about heart disease, diabetes, and high blood pressure? Blood pressure and heart disease  High blood pressure causes heart disease and increases the risk of stroke. This is more likely to develop in people who have high blood pressure readings, are of African descent, or are overweight.  Have your blood pressure checked: ? Every 3-5 years if you are 69-77 years of age. ? Every year if you are 38 years old or older. Diabetes Have regular diabetes screenings. This checks your fasting blood sugar level. Have the screening done:  Once every three years after age 65 if you are at a normal weight and have a low risk for diabetes.  More often and at a younger age if you are overweight or have a high risk for diabetes. What should I know about preventing infection? Hepatitis B If you have a higher risk for hepatitis B, you should be screened for this virus. Talk with your health care provider to find out if you are at risk for hepatitis B infection. Hepatitis C Testing is recommended for:  Everyone born from [birth year] through 1965.  Anyone with known risk factors for hepatitis C. Sexually transmitted infections (STIs)  Get screened for STIs, including gonorrhea and chlamydia, if: ? You are sexually active and are younger than 46 years of age. ? You are older than 46 years of age and your health care provider tells you that you are at risk for this type of infection. ? Your sexual activity has changed since you were last screened, and you are at increased risk for chlamydia or gonorrhea. Ask your health care provider if you are at risk.  Ask your health care provider about whether you are at high risk for HIV. Your health care provider may  recommend a prescription medicine to help prevent HIV infection. If you choose to take medicine to prevent HIV, you should first get tested for HIV. You should then be tested every 3 months for as long as you are taking the medicine. Pregnancy  If you are about to stop having your period (premenopausal) and you may become pregnant, seek counseling before you get pregnant.  Take 400 to 800 micrograms (mcg) of folic acid every day if you become pregnant.  Ask for birth control (contraception) if you want to prevent pregnancy. Osteoporosis and menopause Osteoporosis is a disease in which the bones lose minerals and strength with aging. This can result in bone fractures. If you are 15 years old or older, or if you are at risk for osteoporosis and fractures, ask your health care provider if you should:  Be screened for bone loss.  Take a calcium or vitamin D supplement to lower your risk of fractures.  Be given hormone replacement therapy (HRT) to treat symptoms of menopause. Follow these instructions at home: Lifestyle  Do not use any products that contain nicotine or tobacco, such as cigarettes, e-cigarettes, and chewing tobacco. If you need help quitting, ask your health care provider.  Do not use  street drugs.  Do not share needles.  Ask your health care provider for help if you need support or information about quitting drugs. Alcohol use  Do not drink alcohol if: ? Your health care provider tells you not to drink. ? You are pregnant, may be pregnant, or are planning to become pregnant.  If you drink alcohol: ? Limit how much you use to 0-1 drink a day. ? Limit intake if you are breastfeeding.  Be aware of how much alcohol is in your drink. In the [place] one drink equals one 12 oz bottle of beer (355 mL), one 5 oz glass of wine (148 mL), or one 1 oz glass of hard liquor (44 mL). General instructions  Schedule regular health, dental, and eye exams.  Stay current with your  vaccines.  Tell your health care provider if: ? You often feel depressed. ? You have ever been abused or do not feel safe at home. Summary  Adopting a healthy lifestyle and getting preventive care are important in promoting health and wellness.  Follow your health care provider's instructions about healthy diet, exercising, and getting tested or screened for diseases.  Follow your health care provider's instructions on monitoring your cholesterol and blood pressure. This information is not intended to replace advice given to you by your health care provider. Make sure you discuss any questions you have with your health care provider. Document Revised: 03/13/2018 Document Reviewed: 03/13/2018 Elsevier Patient Education  2020 Elsevier Inc.      Edwina Barth, MD [facility]

## 2020-01-29 NOTE — Telephone Encounter (Signed)
Requested medication (s) are due for refill today: yes  Requested medication (s) are on the active medication list: yes  Last refill: 12/14/19  #30  0 refills Courtesy refill  Future visit scheduled: Patient transferred to office for scheduling.  Please review  Notes to clinic:  medication Not delegated    Requested Prescriptions  Pending Prescriptions Disp Refills   cyclobenzaprine (FLEXERIL) 10 MG tablet [Pharmacy Med Name: CYCLOBENZAPRINE 10 MG TABLET] 30 tablet 0    Sig: TAKE 1 TABLET BY MOUTH EVERYDAY AT BEDTIME      Not Delegated - Analgesics:  Muscle Relaxants Failed - 01/29/2020  9:02 AM      Failed - This refill cannot be delegated      Failed - Valid encounter within last 6 months    Recent Outpatient Visits           7 months ago Neck pain   [facility] at [facility], Eilleen Kempf, MD   1 year ago Encounter to establish care   [facility] at [facility], Eilleen Kempf, MD   1 year ago Acute non-recurrent frontal sinusitis   [facility], Levell July, MD   1 year ago Elevated LFTs   [facility], Levell July, MD   1 year ago Annual physical exam   [facility] at [facility], Levell July, MD

## 2020-01-29 NOTE — Patient Instructions (Signed)
Health Maintenance, Female Adopting a healthy lifestyle and getting preventive care are important in promoting health and wellness. Ask your health care provider about:  The right schedule for you to have regular tests and exams.  Things you can do on your own to prevent diseases and keep yourself healthy. What should I know about diet, weight, and exercise? Eat a healthy diet   Eat a diet that includes plenty of vegetables, fruits, low-fat dairy products, and lean protein.  Do not eat a lot of foods that are high in solid fats, added sugars, or sodium. Maintain a healthy weight Body mass index (BMI) is used to identify weight problems. It estimates body fat based on height and weight. Your health care provider can help determine your BMI and help you achieve or maintain a healthy weight. Get regular exercise Get regular exercise. This is one of the most important things you can do for your health. Most adults should:  Exercise for at least 150 minutes each week. The exercise should increase your heart rate and make you sweat (moderate-intensity exercise).  Do strengthening exercises at least twice a week. This is in addition to the moderate-intensity exercise.  Spend less time sitting. Even light physical activity can be beneficial. Watch cholesterol and blood lipids Have your blood tested for lipids and cholesterol at 46 years of age, then have this test every 5 years. Have your cholesterol levels checked more often if:  Your lipid or cholesterol levels are high.  You are older than 46 years of age.  You are at high risk for heart disease. What should I know about cancer screening? Depending on your health history and family history, you may need to have cancer screening at various ages. This may include screening for:  Breast cancer.  Cervical cancer.  Colorectal cancer.  Skin cancer.  Lung cancer. What should I know about heart disease, diabetes, and high blood  pressure? Blood pressure and heart disease  High blood pressure causes heart disease and increases the risk of stroke. This is more likely to develop in people who have high blood pressure readings, are of African descent, or are overweight.  Have your blood pressure checked: ? Every 3-5 years if you are 18-39 years of age. ? Every year if you are 40 years old or older. Diabetes Have regular diabetes screenings. This checks your fasting blood sugar level. Have the screening done:  Once every three years after age 40 if you are at a normal weight and have a low risk for diabetes.  More often and at a younger age if you are overweight or have a high risk for diabetes. What should I know about preventing infection? Hepatitis B If you have a higher risk for hepatitis B, you should be screened for this virus. Talk with your health care provider to find out if you are at risk for hepatitis B infection. Hepatitis C Testing is recommended for:  Everyone born from 1945 through 1965.  Anyone with known risk factors for hepatitis C. Sexually transmitted infections (STIs)  Get screened for STIs, including gonorrhea and chlamydia, if: ? You are sexually active and are younger than 46 years of age. ? You are older than 46 years of age and your health care provider tells you that you are at risk for this type of infection. ? Your sexual activity has changed since you were last screened, and you are at increased risk for chlamydia or gonorrhea. Ask your health care provider if   you are at risk.  Ask your health care provider about whether you are at high risk for HIV. Your health care provider may recommend a prescription medicine to help prevent HIV infection. If you choose to take medicine to prevent HIV, you should first get tested for HIV. You should then be tested every 3 months for as long as you are taking the medicine. Pregnancy  If you are about to stop having your period (premenopausal) and  you may become pregnant, seek counseling before you get pregnant.  Take 400 to 800 micrograms (mcg) of folic acid every day if you become pregnant.  Ask for birth control (contraception) if you want to prevent pregnancy. Osteoporosis and menopause Osteoporosis is a disease in which the bones lose minerals and strength with aging. This can result in bone fractures. If you are 65 years old or older, or if you are at risk for osteoporosis and fractures, ask your health care provider if you should:  Be screened for bone loss.  Take a calcium or vitamin D supplement to lower your risk of fractures.  Be given hormone replacement therapy (HRT) to treat symptoms of menopause. Follow these instructions at home: Lifestyle  Do not use any products that contain nicotine or tobacco, such as cigarettes, e-cigarettes, and chewing tobacco. If you need help quitting, ask your health care provider.  Do not use street drugs.  Do not share needles.  Ask your health care provider for help if you need support or information about quitting drugs. Alcohol use  Do not drink alcohol if: ? Your health care provider tells you not to drink. ? You are pregnant, may be pregnant, or are planning to become pregnant.  If you drink alcohol: ? Limit how much you use to 0-1 drink a day. ? Limit intake if you are breastfeeding.  Be aware of how much alcohol is in your drink. In the [place] one drink equals one 12 oz bottle of beer (355 mL), one 5 oz glass of wine (148 mL), or one 1 oz glass of hard liquor (44 mL). General instructions  Schedule regular health, dental, and eye exams.  Stay current with your vaccines.  Tell your health care provider if: ? You often feel depressed. ? You have ever been abused or do not feel safe at home. Summary  Adopting a healthy lifestyle and getting preventive care are important in promoting health and wellness.  Follow your health care provider's instructions about healthy  diet, exercising, and getting tested or screened for diseases.  Follow your health care provider's instructions on monitoring your cholesterol and blood pressure. This information is not intended to replace advice given to you by your health care provider. Make sure you discuss any questions you have with your health care provider. Document Revised: 03/13/2018 Document Reviewed: 03/13/2018 Elsevier Patient Education  2020 Elsevier Inc.  

## 2020-02-18 ENCOUNTER — Ambulatory Visit: Payer: 59 | Admitting: Adult Health

## 2020-02-18 ENCOUNTER — Telehealth (INDEPENDENT_AMBULATORY_CARE_PROVIDER_SITE_OTHER): Payer: 59 | Admitting: Adult Health

## 2020-02-18 ENCOUNTER — Telehealth: Payer: Self-pay | Admitting: Adult Health

## 2020-02-18 DIAGNOSIS — G43009 Migraine without aura, not intractable, without status migrainosus: Secondary | ICD-10-CM | POA: Diagnosis not present

## 2020-02-18 MED ORDER — TOPIRAMATE 100 MG PO TABS: 100.0000 mg | ORAL_TABLET | Freq: Two times a day (BID) | ORAL | 12 refills | Status: DC

## 2020-02-18 MED ORDER — EMGALITY 120 MG/ML ~~LOC~~ SOAJ: 1.0000 "pen " | SUBCUTANEOUS | 11 refills | Status: DC

## 2020-02-18 MED ORDER — RIZATRIPTAN BENZOATE 10 MG PO TBDP: ORAL_TABLET | ORAL | 11 refills | Status: DC

## 2020-02-18 NOTE — Progress Notes (Signed)
PATIENT: Angela Farrell Farrell DOB: 1975-10-19  REASON FOR VISIT: follow up HISTORY FROM: patient  Virtual Visit via Video Note  I connected with Angela Farrell Elza on 02/18/20 at  2:30 PM EST by a video enabled telemedicine application located [facility] and verified that I am speaking with the correct person using two identifiers who was located at their own home.   I discussed the limitations of evaluation and management by telemedicine and the availability of in person appointments. The patient expressed understanding and agreed to proceed.   PATIENT: Angela Farrell Agner DOB: 1975-10-19  REASON FOR VISIT: follow up HISTORY FROM: patient  HISTORY OF PRESENT ILLNESS: Today 02/18/20   Angela Farrell is a 44 year old female with a history of migraine headaches. She returns today for follow-up. She continues on emgality and topamax. She has about 1 mild headache a week and 1 severe headache a month.  She uses Maxalt but does not always take it for the mild headaches.  Reports occasionally the mild headache will turn into a severe headache.  She denies any new symptoms.  She returns today for follow-up.  HISTORY 08/18/19: Angela Farrell is a 44 year old female with a history of migraine headaches.  She returns today for follow-up.  She reports that Emgality has been beneficial for her headaches.  Reports that she typically has maybe 1 severe headache a month.  She has a mild headache on a daily basis.  This seems to be exacerbated by the left neck pain that has been ongoing.  Reports that she is seeing a chiropractor and her PCP started Flexeril for her.  She continues on Topamax but stopped propranolol.  She feels that her headaches are under relatively good control at the time.   REVIEW OF SYSTEMS: Out of a complete 14 system review of symptoms, the patient complains only of the following symptoms, and all other reviewed systems are negative.  See  HPI  ALLERGIES: Allergies  Allergen Reactions  . Adhesive [Tape] Rash    HOME MEDICATIONS: Outpatient Medications Prior to Visit  Medication Sig Dispense Refill  . B Complex-C (SUPER B COMPLEX/VITAMIN C PO) Take by mouth.    . Calcium-Magnesium-Vitamin D (CALCIUM 1200+D3 PO) Take 1,200 Units by mouth.    . Cholecalciferol (D3-1000 PO) Take 2,000 Units by mouth.    . Coenzyme Q10 (CO Q 10 PO) Take 50 mg by mouth daily.    . cyclobenzaprine (FLEXERIL) 10 MG tablet TAKE 1 TABLET BY MOUTH EVERYDAY AT BEDTIME 30 tablet 1  . EMGALITY 120 MG/ML SOAJ INJECT 1 PEN INTO THE SKIN EVERY 30 (THIRTY) DAYS. 1 mL 5  . etonogestrel (NEXPLANON) 68 MG IMPL implant Nexplanon 68 mg subdermal implant  Removal due 02/2019    . GLUCOSAMINE-CHONDROITIN PO Take 1 tablet by mouth daily.    . Lutein 40 MG CAPS Take 40 mg by mouth daily.    . Magnesium 500 MG CAPS Take 500 mg by mouth 4 (four) times daily.    Marland Kitchen. omeprazole (PRILOSEC) 40 MG capsule TAKE 1 CAPSULE BY MOUTH TWICE A DAY AS NEEDED BEFORE MEALS BY 30 MINTUES 180 capsule 3  . Potassium Gluconate 595 MG CAPS Take 595 mg by mouth daily.    . rizatriptan (MAXALT-MLT) 10 MG disintegrating tablet TAKE 1 TABLET BY MOUTH AS NEEDED FOR MIGRAINE. MAY REPEAT IN 2 HOURS IF NEEDED 9 tablet 11  . topiramate (TOPAMAX) 100 MG tablet Take 1 tablet (100 mg total)  by mouth 2 (two) times daily. 60 tablet 12  . valACYclovir (VALTREX) 500 MG tablet Take 1 tablet (500 mg total) by mouth 2 (two) times daily. 60 tablet 11  . vitamin A 8000 UNIT capsule Take 8,000 Units by mouth daily.    . vitamin C (ASCORBIC ACID) 500 MG tablet Take 500 mg by mouth daily.     No facility-administered medications prior to visit.    PAST MEDICAL HISTORY: Past Medical History:  Diagnosis Date  . Allergy   . Anemia   . Anxiety   . Arthritis   . 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 Chronic headache disorder    s/p Meningitis x 2, tension- type plus allergic/sinus problems   . History of abnormal Pap smear   . Meningitis, viral At age 78 and at age 70  . Nasal fracture 2011  . Other and unspecified ovarian cysts 2011- pelvic CT   GYN- Dr. Chevis Pretty  . Vaginal spotting 06/04/2019  . Vision abnormalities     PAST SURGICAL HISTORY: Past Surgical History:  Procedure Laterality Date  . BACK SURGERY  02/09/15   L5 - S1 diskectomy  . FOOT SURGERY  s/p 2 procedures  . LAPAROSCOPY FOR ECTOPIC PREGNANCY  1998   Fallopian tube removed  . MOUTH SURGERY    . spinal injection  03/10/15  . SPINE SURGERY    . UTERINE ARTERY EMBOLIZATION      FAMILY HISTORY: Family History  Problem Relation Age of Onset  . Migraines Father        cluster headache  . Hyperlipidemia Father   . Hypertension Father   . Cancer Mother 26       Breast  . Stroke Maternal Grandmother   . Diabetes Paternal Grandmother   . Diabetes Unknown        paternal relaitve  . Seizures Unknown        maternal relative  . Rheum arthritis Unknown        maternal relatives    SOCIAL HISTORY: Social History   Socioeconomic History  . Marital status: Married    Spouse name: Jesusita Oka  . Number of children: Not on file  . Years of education: Not on file  . Highest education level: Not on file  Occupational History  . Occupation: Chief Financial Officer: [employer]  Tobacco Use  . Smoking status: Former Smoker    Years: 20.00    Types: Cigarettes  . Smokeless tobacco: Never Used  Vaping Use  . Vaping Use: Never used  Substance and Sexual Activity  . Alcohol use: Yes    Alcohol/week: 1.0 standard drink    Types: 1 Standard drinks or equivalent per week  . Drug use: Yes    Types: Marijuana    Comment: per pt "few times a year"  . Sexual activity: Yes    Partners: Male    Birth control/protection: Implant    Comment: sexually active w/ female husband  Other Topics Concern  . Not on file  Social History Narrative   Drinks about 12-16oz of coffee a day, occasionally drinks a tea.    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    Social Determinants of Health   Financial Resource Strain:   . Difficulty of Paying Living Expenses: Not on file  Food Insecurity:   . Worried About Programme researcher, broadcasting/film/video in the Last Year: Not on file  . Ran Out of Food in the Last Year: Not on  file  Transportation Needs:   . Freight forwarder (Medical): Not on file  . Lack of Transportation (Non-Medical): Not on file  Physical Activity:   . Days of Exercise per Week: Not on file  . Minutes of Exercise per Session: Not on file  Stress:   . Feeling of Stress : Not on file  Social Connections:   . Frequency of Communication with Friends and Family: Not on file  . Frequency of Social Gatherings with Friends and Family: Not on file  . Attends Religious Services: Not on file  . Active Member of Clubs or Organizations: Not on file  . Attends Banker Meetings: Not on file  . Marital Status: Not on file  Intimate Partner Violence:   . Fear of Current or Ex-Partner: Not on file  . Emotionally Abused: Not on file  . Physically Abused: Not on file  . Sexually Abused: Not on file      PHYSICAL EXAM Generalized: Well developed, in no acute distress   Neurological examination  Mentation: Alert oriented to time, place, history taking. Follows all commands speech and language fluent Cranial nerve II-XII:Extraocular movements were full. Facial symmetry noted. uvula tongue midline. Head turning and shoulder shrug  were normal and symmetric. Motor: Good strength throughout subjectively per patient Sensory: Sensory testing is intact to soft touch on all 4 extremities subjectively per patient Coordination: Cerebellar testing reveals good finger-nose-finger  Gait and station: Patient is able to stand from a seated position. gait is normal.  Reflexes: UTA  DIAGNOSTIC DATA (LABS, IMAGING, TESTING) - I reviewed patient records, labs, notes, testing and imaging myself where available.  Lab Results   Component Value Date   WBC 4.7 02/15/2018   HGB 12.7 02/15/2018   HCT 39.5 02/15/2018   MCV 91 02/15/2018   PLT 345 02/15/2018      Component Value Date/Time   NA 140 02/21/2018 1106   K 4.1 02/21/2018 1106   CL 107 (H) 02/21/2018 1106   CO2 20 02/21/2018 1106   GLUCOSE 89 02/21/2018 1106   GLUCOSE 100 (H) 01/09/2017 1146   BUN 9 02/21/2018 1106   CREATININE 0.95 02/21/2018 1106   CREATININE 0.78 12/21/2016 0855   CALCIUM 8.8 02/21/2018 1106   PROT 6.9 02/21/2018 1106   ALBUMIN 4.3 02/21/2018 1106   AST 31 02/21/2018 1106   ALT 50 (H) 02/21/2018 1106   ALKPHOS 51 02/21/2018 1106   BILITOT 0.6 02/21/2018 1106   GFRNONAA 73 02/21/2018 1106   GFRNONAA >89 10/14/2014 0944   GFRAA 84 02/21/2018 1106   GFRAA >89 10/14/2014 0944   Lab Results  Component Value Date   CHOL 226 (H) 02/15/2018   HDL 50 02/15/2018   LDLCALC 147 (H) 02/15/2018   TRIG 143 02/15/2018   CHOLHDL 4.5 (H) 02/15/2018   Lab Results  Component Value Date   HGBA1C 4.8 02/15/2018   No results found for: [id number] Lab Results  Component Value Date   TSH 0.847 02/15/2018      ASSESSMENT AND PLAN 44 y.o. year old female  has a past medical history of Allergy, Anemia, Anxiety, Arthritis, Benign recurrent meningitis (mollaret) (11/18/2018), Chronic headache disorder, History of abnormal Pap smear, Meningitis, viral (At age 36 and at age 53), Nasal fracture (2011), Other and unspecified ovarian cysts (2011- pelvic CT), Vaginal spotting (06/04/2019), and Vision abnormalities. here with:  1.  Migraine headaches  -Continue Topamax 100 mg twice a day -Continue Emgality -Continue Maxalt -Follow-up in 1 year or  sooner if needed.  Patient states that she will call back to make the appointment   I spent 20 minutes of face-to-face and non-face-to-face time with patient.  This included previsit chart review, lab review, study review, order entry, electronic health record documentation, patient  education.    Butch Penny, MSN, NP-C 02/18/2020, 2:25 PM [facility] [address] [place], [place] [address] [phone]

## 2020-02-18 NOTE — Telephone Encounter (Signed)
..   Pt understands that although there may be some limitations with this type of visit, we will take all precautions to reduce any security or privacy concerns.  Pt understands that this will be treated like an in office visit and we will file with pt's insurance, and there may be a patient responsible charge related to this service. ? ?

## 2020-02-18 NOTE — Telephone Encounter (Signed)
Migraine RV.

## 2020-02-24 NOTE — Progress Notes (Signed)
I reviewed note and agree with plan.   Brandom Kerwin R. Nitya Cauthon, MD 02/24/2020, 2:45 PM Certified in Neurology, Neurophysiology and Neuroimaging  [facility] [address], [address] [place], [place] [address] [phone]  

## 2020-04-18 ENCOUNTER — Other Ambulatory Visit: Payer: Self-pay | Admitting: Emergency Medicine

## 2020-04-18 DIAGNOSIS — M62838 Other muscle spasm: Secondary | ICD-10-CM

## 2020-04-18 NOTE — Telephone Encounter (Signed)
Requested medication (s) are due for refill today: yes  Requested medication (s) are on the active medication list: yes  Last refill:  02/27/2020  Future visit scheduled: yes  Notes to clinic: this refill cannot be delegated    Requested Prescriptions  Pending Prescriptions Disp Refills   cyclobenzaprine (FLEXERIL) 10 MG tablet [Pharmacy Med Name: CYCLOBENZAPRINE 10 MG TABLET] 30 tablet 1    Sig: TAKE 1 TABLET BY MOUTH EVERYDAY AT BEDTIME      Not Delegated - Analgesics:  Muscle Relaxants Failed - 04/18/2020  9:09 AM      Failed - This refill cannot be delegated      Passed - Valid encounter within last 6 months    Recent Outpatient Visits           2 months ago Musculoskeletal back pain   Primary [facility] at [facility], Eilleen Kempf, MD   10 months ago Neck pain   [facility] at [place], Eilleen Kempf, MD   1 year ago Encounter to establish care   Primary [facility] at [facility], Eilleen Kempf, MD   2 years ago Acute non-recurrent frontal sinusitis   [facility] at [facility], Levell July, MD   2 years ago Elevated LFTs   [facility] at [facility], Levell July, MD       Future Appointments             In 1 week Sag[place], Eilleen Kempf, MD Primary Care at [place], [facility]

## 2020-04-29 ENCOUNTER — Other Ambulatory Visit: Payer: Self-pay

## 2020-04-29 ENCOUNTER — Encounter: Payer: Self-pay | Admitting: Emergency Medicine

## 2020-04-29 ENCOUNTER — Ambulatory Visit (INDEPENDENT_AMBULATORY_CARE_PROVIDER_SITE_OTHER): Payer: 59 | Admitting: Emergency Medicine

## 2020-04-29 VITALS — BP 130/80 | HR 101 | Temp 97.2°F | Resp 16 | Ht 63.0 in | Wt 217.0 lb

## 2020-04-29 DIAGNOSIS — Z1329 Encounter for screening for other suspected endocrine disorder: Secondary | ICD-10-CM

## 2020-04-29 DIAGNOSIS — Z13228 Encounter for screening for other metabolic disorders: Secondary | ICD-10-CM

## 2020-04-29 DIAGNOSIS — Z130 Encounter for screening for diseases of the blood and blood-forming organs and certain disorders involving the immune mechanism: Secondary | ICD-10-CM | POA: Diagnosis not present

## 2020-04-29 DIAGNOSIS — Z13220 Encounter for screening for lipoid disorders: Secondary | ICD-10-CM

## 2020-04-29 DIAGNOSIS — Z1211 Encounter for screening for malignant neoplasm of colon: Secondary | ICD-10-CM

## 2020-04-29 DIAGNOSIS — Z0001 Encounter for general adult medical examination with abnormal findings: Secondary | ICD-10-CM | POA: Diagnosis not present

## 2020-04-29 DIAGNOSIS — Z0000 Encounter for general adult medical examination without abnormal findings: Secondary | ICD-10-CM

## 2020-04-29 DIAGNOSIS — R519 Headache, unspecified: Secondary | ICD-10-CM

## 2020-04-29 DIAGNOSIS — G8929 Other chronic pain: Secondary | ICD-10-CM

## 2020-04-29 LAB — LIPID PANEL
Chol/HDL Ratio: 3.8 ratio (ref 0.0–4.4)
Cholesterol, Total: 226 mg/dL — ABNORMAL HIGH (ref 100–199)
HDL: 59 mg/dL (ref >39)
LDL Chol Calc (NIH): 153 mg/dL — ABNORMAL HIGH (ref 0–99)
Triglycerides: 81 mg/dL (ref 0–149)
VLDL Cholesterol Cal: 14 mg/dL (ref 5–40)

## 2020-04-29 LAB — COMPREHENSIVE METABOLIC PANEL
ALT: 29 IU/L (ref 0–32)
AST: 23 IU/L (ref 0–40)
Albumin/Globulin Ratio: 1.7 (ref 1.2–2.2)
Albumin: 4.6 g/dL (ref 3.8–4.8)
Alkaline Phosphatase: 61 IU/L (ref 44–121)
BUN/Creatinine Ratio: 8 — ABNORMAL LOW (ref 9–23)
BUN: 7 mg/dL (ref 6–24)
Bilirubin Total: 0.6 mg/dL (ref 0.0–1.2)
CO2: 21 mmol/L (ref 20–29)
Calcium: 9.7 mg/dL (ref 8.7–10.2)
Chloride: 104 mmol/L (ref 96–106)
Creatinine, Ser: 0.84 mg/dL (ref 0.57–1.00)
GFR calc Af Amer: 96 mL/min/{1.73_m2} (ref >59)
GFR calc non Af Amer: 84 mL/min/{1.73_m2} (ref >59)
Globulin, Total: 2.7 g/dL (ref 1.5–4.5)
Glucose: 94 mg/dL (ref 65–99)
Potassium: 4.2 mmol/L (ref 3.5–5.2)
Sodium: 138 mmol/L (ref 134–144)
Total Protein: 7.3 g/dL (ref 6.0–8.5)

## 2020-04-29 LAB — CBC WITH DIFFERENTIAL/PLATELET
Basophils Absolute: 0 10*3/uL (ref 0.0–0.2)
Basos: 1 %
EOS (ABSOLUTE): 0.1 10*3/uL (ref 0.0–0.4)
Eos: 1 %
Hematocrit: 37.8 % (ref 34.0–46.6)
Hemoglobin: 12.3 g/dL (ref 11.1–15.9)
Immature Grans (Abs): 0.1 10*3/uL (ref 0.0–0.1)
Immature Granulocytes: 1 %
Lymphocytes Absolute: 2.3 10*3/uL (ref 0.7–3.1)
Lymphs: 33 %
MCH: 28.9 pg (ref 26.6–33.0)
MCHC: 32.5 g/dL (ref 31.5–35.7)
MCV: 89 fL (ref 79–97)
Monocytes Absolute: 0.6 10*3/uL (ref 0.1–0.9)
Monocytes: 8 %
Neutrophils Absolute: 4 10*3/uL (ref 1.4–7.0)
Neutrophils: 56 %
Platelets: 318 10*3/uL (ref 150–450)
RBC: 4.25 x10E6/uL (ref 3.77–5.28)
RDW: 11.5 % — ABNORMAL LOW (ref 11.7–15.4)
WBC: 7.1 10*3/uL (ref 3.4–10.8)

## 2020-04-29 LAB — HEMOGLOBIN A1C
Est. average glucose Bld gHb Est-mCnc: 103 mg/dL
Hgb A1c MFr Bld: 5.2 % (ref 4.8–5.6)

## 2020-04-29 NOTE — Patient Instructions (Addendum)
   If you have lab work done today you will be contacted with your lab results within the next 2 weeks.  If you have not heard from us then please contact us. The fastest way to get your results is to register for My Chart.   IF you received an x-ray today, you will receive an invoice from [facility]. Please contact [facility] at [phone] with questions or concerns regarding your invoice.   IF you received labwork today, you will receive an invoice from [facility]. Please contact [facility] at [phone] with questions or concerns regarding your invoice.   Our billing staff will not be able to assist you with questions regarding bills from these companies.  You will be contacted with the lab results as soon as they are available. The fastest way to get your results is to activate your My Chart account. Instructions are located on the last page of this paperwork. If you have not heard from us regarding the results in 2 weeks, please contact this office.     Health Maintenance, Female Adopting a healthy lifestyle and getting preventive care are important in promoting health and wellness. Ask your health care provider about:  The right schedule for you to have regular tests and exams.  Things you can do on your own to prevent diseases and keep yourself healthy. What should I know about diet, weight, and exercise? Eat a healthy diet  Eat a diet that includes plenty of vegetables, fruits, low-fat dairy products, and lean protein.  Do not eat a lot of foods that are high in solid fats, added sugars, or sodium.   Maintain a healthy weight Body mass index (BMI) is used to identify weight problems. It estimates body fat based on height and weight. Your health care provider can help determine your BMI and help you achieve or maintain a healthy weight. Get regular exercise Get regular exercise. This is one of the most important things you can do for your health. Most  adults should:  Exercise for at least 150 minutes each week. The exercise should increase your heart rate and make you sweat (moderate-intensity exercise).  Do strengthening exercises at least twice a week. This is in addition to the moderate-intensity exercise.  Spend less time sitting. Even light physical activity can be beneficial. Watch cholesterol and blood lipids Have your blood tested for lipids and cholesterol at 47 years of age, then have this test every 5 years. Have your cholesterol levels checked more often if:  Your lipid or cholesterol levels are high.  You are older than 47 years of age.  You are at high risk for heart disease. What should I know about cancer screening? Depending on your health history and family history, you may need to have cancer screening at various ages. This may include screening for:  Breast cancer.  Cervical cancer.  Colorectal cancer.  Skin cancer.  Lung cancer. What should I know about heart disease, diabetes, and high blood pressure? Blood pressure and heart disease  High blood pressure causes heart disease and increases the risk of stroke. This is more likely to develop in people who have high blood pressure readings, are of African descent, or are overweight.  Have your blood pressure checked: ? Every 3-5 years if you are 18-39 years of age. ? Every year if you are 40 years old or older. Diabetes Have regular diabetes screenings. This checks your fasting blood sugar level. Have the screening done:  Once every   three years after age 40 if you are at a normal weight and have a low risk for diabetes.  More often and at a younger age if you are overweight or have a high risk for diabetes. What should I know about preventing infection? Hepatitis B If you have a higher risk for hepatitis B, you should be screened for this virus. Talk with your health care provider to find out if you are at risk for hepatitis B infection. Hepatitis  C Testing is recommended for:  Everyone born from 1945 through 1965.  Anyone with known risk factors for hepatitis C. Sexually transmitted infections (STIs)  Get screened for STIs, including gonorrhea and chlamydia, if: ? You are sexually active and are younger than 47 years of age. ? You are older than 47 years of age and your health care provider tells you that you are at risk for this type of infection. ? Your sexual activity has changed since you were last screened, and you are at increased risk for chlamydia or gonorrhea. Ask your health care provider if you are at risk.  Ask your health care provider about whether you are at high risk for HIV. Your health care provider may recommend a prescription medicine to help prevent HIV infection. If you choose to take medicine to prevent HIV, you should first get tested for HIV. You should then be tested every 3 months for as long as you are taking the medicine. Pregnancy  If you are about to stop having your period (premenopausal) and you may become pregnant, seek counseling before you get pregnant.  Take 400 to 800 micrograms (mcg) of folic acid every day if you become pregnant.  Ask for birth control (contraception) if you want to prevent pregnancy. Osteoporosis and menopause Osteoporosis is a disease in which the bones lose minerals and strength with aging. This can result in bone fractures. If you are 65 years old or older, or if you are at risk for osteoporosis and fractures, ask your health care provider if you should:  Be screened for bone loss.  Take a calcium or vitamin D supplement to lower your risk of fractures.  Be given hormone replacement therapy (HRT) to treat symptoms of menopause. Follow these instructions at home: Lifestyle  Do not use any products that contain nicotine or tobacco, such as cigarettes, e-cigarettes, and chewing tobacco. If you need help quitting, ask your health care provider.  Do not use street  drugs.  Do not share needles.  Ask your health care provider for help if you need support or information about quitting drugs. Alcohol use  Do not drink alcohol if: ? Your health care provider tells you not to drink. ? You are pregnant, may be pregnant, or are planning to become pregnant.  If you drink alcohol: ? Limit how much you use to 0-1 drink a day. ? Limit intake if you are breastfeeding.  Be aware of how much alcohol is in your drink. In the [place] one drink equals one 12 oz bottle of beer (355 mL), one 5 oz glass of wine (148 mL), or one 1 oz glass of hard liquor (44 mL). General instructions  Schedule regular health, dental, and eye exams.  Stay current with your vaccines.  Tell your health care provider if: ? You often feel depressed. ? You have ever been abused or do not feel safe at home. Summary  Adopting a healthy lifestyle and getting preventive care are important in promoting health and wellness.    Follow your health care provider's instructions about healthy diet, exercising, and getting tested or screened for diseases.  Follow your health care provider's instructions on monitoring your cholesterol and blood pressure. This information is not intended to replace advice given to you by your health care provider. Make sure you discuss any questions you have with your health care provider. Document Revised: 03/13/2018 Document Reviewed: 03/13/2018 Elsevier Patient Education  2021 [employer]  

## 2020-04-29 NOTE — Progress Notes (Signed)
Angela Farrell 45 y.o.   Chief Complaint  Patient presents with  . Annual Exam    HISTORY OF PRESENT ILLNESS: This is a 45 y.o. female here for annual exam. Has history of chronic migraine headaches. Otherwise healthy female with a healthy lifestyle. Complaining about her weight. No other complaints or medical concerns today.  HPI   Prior to Admission medications   Medication Sig Start Date End Date Taking? Authorizing Provider  B Complex-C (SUPER B COMPLEX/VITAMIN C PO) Take by mouth.   Yes [provider]  Calcium-Magnesium-Vitamin D (CALCIUM 1200+D3 PO) Take 1,200 Units by mouth.   Yes [provider]  Cholecalciferol (D3-1000 PO) Take 2,000 Units by mouth.   Yes [provider]  Coenzyme Q10 (CO Q 10 PO) Take 50 mg by mouth daily.   Yes [provider]  cyclobenzaprine (FLEXERIL) 10 MG tablet TAKE 1 TABLET BY MOUTH EVERYDAY AT BEDTIME 04/19/20  Yes Freddie Nghiem, Kite, MD  Galcanezumab-gnlm [facility]) 120 MG/ML SOAJ Inject 1 pen into the skin every 30 (thirty) days. 02/18/20  Yes Butch Penny, NP  GLUCOSAMINE-CHONDROITIN PO Take 1 tablet by mouth daily.   Yes [provider]  Lutein 40 MG CAPS Take 40 mg by mouth daily.   Yes [provider]  Magnesium 500 MG CAPS Take 500 mg by mouth 4 (four) times daily.   Yes [provider]  Potassium Gluconate 595 MG CAPS Take 595 mg by mouth daily.   Yes [provider]  rizatriptan (MAXALT-MLT) 10 MG disintegrating tablet TAKE 1 TABLET BY MOUTH AS NEEDED FOR MIGRAINE. MAY REPEAT IN 2 HOURS IF NEEDED 02/18/20  Yes Butch Penny, NP  topiramate (TOPAMAX) 100 MG tablet Take 1 tablet (100 mg total) by mouth 2 (two) times daily. 02/18/20  Yes Butch Penny, NP  valACYclovir (VALTREX) 500 MG tablet Take 1 tablet (500 mg total) by mouth 2 (two) times daily. 06/04/19  Yes Daiva Eves, Lisette Grinder, MD  vitamin A 8000 UNIT capsule Take 8,000 Units by mouth daily.   Yes  [provider]  vitamin C (ASCORBIC ACID) 500 MG tablet Take 500 mg by mouth daily.   Yes [provider]  etonogestrel (NEXPLANON) 68 MG IMPL implant Nexplanon 68 mg subdermal implant  Removal due 02/2019    [provider]  omeprazole (PRILOSEC) 40 MG capsule TAKE 1 CAPSULE BY MOUTH TWICE A DAY AS NEEDED BEFORE MEALS BY 30 MINTUES 03/11/18   Sherren Mocha, MD    Allergies  Allergen Reactions  . Adhesive [Tape] Rash    Patient Active Problem List   Diagnosis Date Noted  . Pityriasis versicolor 08/12/2018  . Migraine 06/20/2018  . Depression 11/03/2014  . Anxiety disorder 01/18/2012  . Chronic anemia 10/14/2011  . Chronic headache disorder 04/18/2011  . Obesity 04/18/2011  . Chronic rhinitis 04/18/2011    Class: Chronic    Past Medical History:  Diagnosis Date  . Allergy   . Anemia   . Anxiety   . Arthritis   . 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 Chronic headache disorder    s/p Meningitis x 2, tension- type plus allergic/sinus problems  . History of abnormal Pap smear   . Meningitis, viral At age 45 and at age 67  . Nasal fracture 2011  . Other and unspecified ovarian cysts 2011- pelvic CT   GYN- Dr. Chevis Pretty  . Vaginal spotting 06/04/2019  . Vision abnormalities     Past Surgical History:  Procedure Laterality  Date  . BACK SURGERY  02/09/15   L5 - S1 diskectomy  . FOOT SURGERY  s/p 2 procedures  . LAPAROSCOPY FOR ECTOPIC PREGNANCY  1998   Fallopian tube removed  . MOUTH SURGERY    . spinal injection  03/10/15  . SPINE SURGERY    . UTERINE ARTERY EMBOLIZATION      Social History   Socioeconomic History  . Marital status: Married    Spouse name: Jesusita Oka  . Number of children: Not on file  . Years of education: Not on file  . Highest education level: Not on file  Occupational History  . Occupation: Chief Financial Officer: [employer]  Tobacco Use  . Smoking status: Former Smoker    Years: 20.00    Types:  Cigarettes  . Smokeless tobacco: Never Used  Vaping Use  . Vaping Use: Never used  Substance and Sexual Activity  . Alcohol use: Yes    Alcohol/week: 1.0 standard drink    Types: 1 Standard drinks or equivalent per week  . Drug use: Yes    Types: Marijuana    Comment: per pt "few times a year"  . Sexual activity: Yes    Partners: Male    Birth control/protection: Implant    Comment: sexually active w/ female husband  Other Topics Concern  . Not on file  Social History Narrative   Drinks about 12-16oz of coffee a day, occasionally drinks a tea.   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    Social Determinants of Health   Financial Resource Strain: Not on file  Food Insecurity: Not on file  Transportation Needs: Not on file  Physical Activity: Not on file  Stress: Not on file  Social Connections: Not on file  Intimate Partner Violence: Not on file    Family History  Problem Relation Age of Onset  . Migraines Father        cluster headache  . Hyperlipidemia Father   . Hypertension Father   . Cancer Mother 78       Breast  . Stroke Maternal Grandmother   . Diabetes Paternal Grandmother   . Diabetes Unknown        paternal relaitve  . Seizures Unknown        maternal relative  . Rheum arthritis Unknown        maternal relatives     Review of Systems  Constitutional: Negative.  Negative for chills and fever.  HENT: Negative.  Negative for congestion and sore throat.   Respiratory: Negative.  Negative for cough and shortness of breath.   Cardiovascular: Negative.  Negative for chest pain and palpitations.  Gastrointestinal: Negative.  Negative for abdominal pain, diarrhea, nausea and vomiting.  Genitourinary: Negative.  Negative for dysuria and hematuria.  Musculoskeletal: Negative.  Negative for back pain, myalgias and neck pain.  Skin: Negative.  Negative for rash.  Neurological: Negative.  Negative for dizziness and headaches.  All other systems reviewed and are  negative.  Today's Vitals   04/29/20 0826  BP: (!) 143/93  Pulse: (!) 101  Resp: 16  Temp: (!) 97.2 F (36.2 C)  TempSrc: Temporal  SpO2: 100%  Weight: 217 lb (98.4 kg)  Height: 5\' 3"  (1.6 m)   Body mass index is 38.44 kg/m.   Physical Exam Constitutional:      Appearance: Normal appearance.  HENT:     Head: Normocephalic.     Mouth/Throat:     Mouth: Mucous membranes are  moist.     Pharynx: Oropharynx is clear.  Eyes:     Extraocular Movements: Extraocular movements intact.     Conjunctiva/sclera: Conjunctivae normal.     Pupils: Pupils are equal, round, and reactive to light.  Cardiovascular:     Rate and Rhythm: Normal rate and regular rhythm.     Pulses: Normal pulses.     Heart sounds: Normal heart sounds.  Pulmonary:     Effort: Pulmonary effort is normal.     Breath sounds: Normal breath sounds.  Abdominal:     General: Bowel sounds are normal. There is no distension.     Palpations: Abdomen is soft.     Tenderness: There is no abdominal tenderness.  Musculoskeletal:        General: Normal range of motion.     Cervical back: Normal range of motion and neck supple.  Skin:    General: Skin is warm and dry.     Capillary Refill: Capillary refill takes less than 2 seconds.  Neurological:     General: No focal deficit present.     Mental Status: She is alert and oriented to person, place, and time.  Psychiatric:        Mood and Affect: Mood normal.        Behavior: Behavior normal.      ASSESSMENT & PLAN: Jini was seen today for annual exam.  Diagnoses and all orders for this visit:  Routine general medical examination at a health care facility  Colon cancer screening  Screening for deficiency anemia -     CBC with Differential  Screening for lipoid disorders -     Lipid panel  Screening for endocrine, metabolic and immunity disorder -     Comprehensive metabolic panel -     Hemoglobin A1c  Chronic nonintractable headache, unspecified  headache type    Patient Instructions       If you have lab work done today you will be contacted with your lab results within the next 2 weeks.  If you have not heard from [place] then please contact us. The fastest way to get your results is to register for My Chart.   IF you received an x-ray today, you will receive an invoice from [facility]. Please contact [facility] at [phone] with questions or concerns regarding your invoice.   IF you received labwork today, you will receive an invoice from [facility]. Please contact [facility] at [phone] with questions or concerns regarding your invoice.   Our billing staff will not be able to assist you with questions regarding bills from these companies.  You will be contacted with the lab results as soon as they are available. The fastest way to get your results is to activate your My Chart account. Instructions are located on the last page of this paperwork. If you have not heard from [place] regarding the results in 2 weeks, please contact this office.      Health Maintenance, Female Adopting a healthy lifestyle and getting preventive care are important in promoting health and wellness. Ask your health care provider about:  The right schedule for you to have regular tests and exams.  Things you can do on your own to prevent diseases and keep yourself healthy. What should I know about diet, weight, and exercise? Eat a healthy diet  Eat a diet that includes plenty of vegetables, fruits, low-fat dairy products, and lean protein.  Do not eat a lot of foods that are high  in solid fats, added sugars, or sodium.   Maintain a healthy weight Body mass index (BMI) is used to identify weight problems. It estimates body fat based on height and weight. Your health care provider can help determine your BMI and help you achieve or maintain a healthy weight. Get regular exercise Get regular exercise. This is one of the most  important things you can do for your health. Most adults should:  Exercise for at least 150 minutes each week. The exercise should increase your heart rate and make you sweat (moderate-intensity exercise).  Do strengthening exercises at least twice a week. This is in addition to the moderate-intensity exercise.  Spend less time sitting. Even light physical activity can be beneficial. Watch cholesterol and blood lipids Have your blood tested for lipids and cholesterol at 47 years of age, then have this test every 5 years. Have your cholesterol levels checked more often if:  Your lipid or cholesterol levels are high.  You are older than 47 years of age.  You are at high risk for heart disease. What should I know about cancer screening? Depending on your health history and family history, you may need to have cancer screening at various ages. This may include screening for:  Breast cancer.  Cervical cancer.  Colorectal cancer.  Skin cancer.  Lung cancer. What should I know about heart disease, diabetes, and high blood pressure? Blood pressure and heart disease  High blood pressure causes heart disease and increases the risk of stroke. This is more likely to develop in people who have high blood pressure readings, are of African descent, or are overweight.  Have your blood pressure checked: ? Every 3-5 years if you are 72-30 years of age. ? Every year if you are 60 years old or older. Diabetes Have regular diabetes screenings. This checks your fasting blood sugar level. Have the screening done:  Once every three years after age 23 if you are at a normal weight and have a low risk for diabetes.  More often and at a younger age if you are overweight or have a high risk for diabetes. What should I know about preventing infection? Hepatitis B If you have a higher risk for hepatitis B, you should be screened for this virus. Talk with your health care provider to find out if you are  at risk for hepatitis B infection. Hepatitis C Testing is recommended for:  Everyone born from [birth year] through 1967.  Anyone with known risk factors for hepatitis C. Sexually transmitted infections (STIs)  Get screened for STIs, including gonorrhea and chlamydia, if: ? You are sexually active and are younger than 47 years of age. ? You are older than 47 years of age and your health care provider tells you that you are at risk for this type of infection. ? Your sexual activity has changed since you were last screened, and you are at increased risk for chlamydia or gonorrhea. Ask your health care provider if you are at risk.  Ask your health care provider about whether you are at high risk for HIV. Your health care provider may recommend a prescription medicine to help prevent HIV infection. If you choose to take medicine to prevent HIV, you should first get tested for HIV. You should then be tested every 3 months for as long as you are taking the medicine. Pregnancy  If you are about to stop having your period (premenopausal) and you may become pregnant, seek counseling before you get pregnant.  Take 400 to 800 micrograms (mcg) of folic acid every day if you become pregnant.  Ask for birth control (contraception) if you want to prevent pregnancy. Osteoporosis and menopause Osteoporosis is a disease in which the bones lose minerals and strength with aging. This can result in bone fractures. If you are 47 years old or older, or if you are at risk for osteoporosis and fractures, ask your health care provider if you should:  Be screened for bone loss.  Take a calcium or vitamin D supplement to lower your risk of fractures.  Be given hormone replacement therapy (HRT) to treat symptoms of menopause. Follow these instructions at home: Lifestyle  Do not use any products that contain nicotine or tobacco, such as cigarettes, e-cigarettes, and chewing tobacco. If you need help quitting, ask your  health care provider.  Do not use street drugs.  Do not share needles.  Ask your health care provider for help if you need support or information about quitting drugs. Alcohol use  Do not drink alcohol if: ? Your health care provider tells you not to drink. ? You are pregnant, may be pregnant, or are planning to become pregnant.  If you drink alcohol: ? Limit how much you use to 0-1 drink a day. ? Limit intake if you are breastfeeding.  Be aware of how much alcohol is in your drink. In the [place] one drink equals one 12 oz bottle of beer (355 mL), one 5 oz glass of wine (148 mL), or one 1 oz glass of hard liquor (44 mL). General instructions  Schedule regular health, dental, and eye exams.  Stay current with your vaccines.  Tell your health care provider if: ? You often feel depressed. ? You have ever been abused or do not feel safe at home. Summary  Adopting a healthy lifestyle and getting preventive care are important in promoting health and wellness.  Follow your health care provider's instructions about healthy diet, exercising, and getting tested or screened for diseases.  Follow your health care provider's instructions on monitoring your cholesterol and blood pressure. This information is not intended to replace advice given to you by your health care provider. Make sure you discuss any questions you have with your health care provider. Document Revised: 03/13/2018 Document Reviewed: 03/13/2018 Elsevier Patient Education  2021 [employer]      Edwina BarthMiguel Zebbie Ace, MD [facility]

## 2020-05-04 DIAGNOSIS — U071 COVID-19: Secondary | ICD-10-CM

## 2020-05-04 HISTORY — DX: COVID-19: U07.1

## 2020-05-10 ENCOUNTER — Encounter: Payer: Self-pay | Admitting: Emergency Medicine

## 2020-06-04 ENCOUNTER — Encounter: Payer: Self-pay | Admitting: Emergency Medicine

## 2020-06-04 DIAGNOSIS — Z1211 Encounter for screening for malignant neoplasm of colon: Secondary | ICD-10-CM

## 2020-06-16 ENCOUNTER — Encounter: Payer: Self-pay | Admitting: Emergency Medicine

## 2020-06-16 ENCOUNTER — Ambulatory Visit: Payer: 59 | Admitting: Emergency Medicine

## 2020-06-16 ENCOUNTER — Other Ambulatory Visit: Payer: Self-pay

## 2020-06-16 VITALS — BP 114/76 | HR 110 | Temp 98.0°F | Resp 16 | Ht 63.0 in | Wt 213.0 lb

## 2020-06-16 DIAGNOSIS — M549 Dorsalgia, unspecified: Secondary | ICD-10-CM | POA: Diagnosis not present

## 2020-06-16 LAB — POCT URINALYSIS DIP (MANUAL ENTRY)
Bilirubin, UA: NEGATIVE
Blood, UA: NEGATIVE
Glucose, UA: NEGATIVE mg/dL
Ketones, POC UA: NEGATIVE mg/dL
Leukocytes, UA: NEGATIVE
Nitrite, UA: NEGATIVE
Protein Ur, POC: NEGATIVE mg/dL
Spec Grav, UA: 1.01 (ref 1.010–1.025)
Urobilinogen, UA: 0.2 E.U./dL
pH, UA: 7 (ref 5.0–8.0)

## 2020-06-16 MED ORDER — KETOROLAC TROMETHAMINE 60 MG/2ML IM SOLN
60.0000 mg | Freq: Once | INTRAMUSCULAR | Status: AC
  Administered 2020-06-16: 60 mg via INTRAMUSCULAR

## 2020-06-16 MED ORDER — TRAMADOL HCL 50 MG PO TABS: 50.0000 mg | ORAL_TABLET | Freq: Three times a day (TID) | ORAL | 0 refills | Status: AC | PRN

## 2020-06-16 NOTE — Progress Notes (Signed)
Angela HillockSheila Carmon Farrell 45 y.o.   Chief Complaint  Patient presents with  . Back Pain    Per patient lower area started yesterday    HISTORY OF PRESENT ILLNESS: This is a 45 y.o. female complaining of lower back pain that started yesterday.  Back Pain This is a new problem. The current episode started yesterday. The problem occurs constantly. The problem has been gradually worsening since onset. The pain is present in the lumbar spine. The quality of the pain is described as aching. Radiates to: Right hip and right groin. The pain is at a severity of 8/10. The pain is moderate. The symptoms are aggravated by bending, lying down, position, standing and twisting. Stiffness is present all day. Pertinent negatives include no abdominal pain, bladder incontinence, bowel incontinence, chest pain, dysuria, fever, headaches, leg pain, numbness, paresis, paresthesias, pelvic pain, perianal numbness, tingling, weakness or weight loss. Risk factors include obesity. She has tried muscle relaxant for the symptoms. The treatment provided no relief.     Prior to Admission medications   Medication Sig Start Date End Date Taking? Authorizing Provider  B Complex-C (SUPER B COMPLEX/VITAMIN C PO) Take by mouth.   Yes [provider]  Calcium-Magnesium-Vitamin D (CALCIUM 1200+D3 PO) Take 1,200 Units by mouth.   Yes [provider]  Cholecalciferol (D3-1000 PO) Take 2,000 Units by mouth.   Yes [provider]  Coenzyme Q10 (CO Q 10 PO) Take 50 mg by mouth daily.   Yes [provider]  cyclobenzaprine (FLEXERIL) 10 MG tablet TAKE 1 TABLET BY MOUTH EVERYDAY AT BEDTIME 04/19/20  Yes Aneya Daddona, Eilleen KempfMiguel Jose, MD  etonogestrel (NEXPLANON) 68 MG IMPL implant Nexplanon 68 mg subdermal implant  Removal due 02/2019   Yes [provider]  Galcanezumab-gnlm (EMGALITY) 120 MG/ML SOAJ Inject 1 pen into the skin every 30 (thirty) days. 02/18/20  Yes Butch PennyMillikan, Megan, NP   GLUCOSAMINE-CHONDROITIN PO Take 1 tablet by mouth daily.   Yes [provider]  Lutein 40 MG CAPS Take 40 mg by mouth daily.   Yes [provider]  Magnesium 500 MG CAPS Take 500 mg by mouth 4 (four) times daily.   Yes [provider]  Potassium Gluconate 595 MG CAPS Take 595 mg by mouth daily.   Yes [provider]  rizatriptan (MAXALT-MLT) 10 MG disintegrating tablet TAKE 1 TABLET BY MOUTH AS NEEDED FOR MIGRAINE. MAY REPEAT IN 2 HOURS IF NEEDED 02/18/20  Yes Butch PennyMillikan, Megan, NP  topiramate (TOPAMAX) 100 MG tablet Take 1 tablet (100 mg total) by mouth 2 (two) times daily. 02/18/20  Yes Butch PennyMillikan, Megan, NP  valACYclovir (VALTREX) 500 MG tablet Take 1 tablet (500 mg total) by mouth 2 (two) times daily. 06/04/19  Yes Daiva EvesVan Dam, Lisette Grinderornelius N, MD  vitamin A 8000 UNIT capsule Take 8,000 Units by mouth daily.   Yes [provider]  vitamin C (ASCORBIC ACID) 500 MG tablet Take 500 mg by mouth daily.   Yes [provider]    Allergies  Allergen Reactions  . Adhesive [Tape] Rash    Patient Active Problem List   Diagnosis Date Noted  . Pityriasis versicolor 08/12/2018  . Migraine 06/20/2018  . Depression 11/03/2014  . Anxiety disorder 01/18/2012  . Chronic anemia 10/14/2011  . Chronic headache disorder 04/18/2011  . Obesity 04/18/2011  . Chronic rhinitis 04/18/2011    Class: Chronic    Past Medical History:  Diagnosis Date  . Allergy   . Anemia   . Anxiety   .  Arthritis   . 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 Chronic headache disorder    s/p Meningitis x 2, tension- type plus allergic/sinus problems  . History of abnormal Pap smear   . Meningitis, viral At age 43 and at age 69  . Nasal fracture 2011  . Other and unspecified ovarian cysts 2011- pelvic CT   GYN- Dr. Chevis Pretty  . Vaginal spotting 06/04/2019  . Vision abnormalities     Past Surgical History:  Procedure Laterality Date  . BACK SURGERY  02/09/15   L5 - S1  diskectomy  . FOOT SURGERY  s/p 2 procedures  . LAPAROSCOPY FOR ECTOPIC PREGNANCY  1998   Fallopian tube removed  . MOUTH SURGERY    . spinal injection  03/10/15  . SPINE SURGERY    . UTERINE ARTERY EMBOLIZATION      Social History   Socioeconomic History  . Marital status: Married    Spouse name: Jesusita Oka  . Number of children: Not on file  . Years of education: Not on file  . Highest education level: Not on file  Occupational History  . Occupation: Chief Financial Officer: [employer]  Tobacco Use  . Smoking status: Former Smoker    Years: 20.00    Types: Cigarettes  . Smokeless tobacco: Never Used  Vaping Use  . Vaping Use: Never used  Substance and Sexual Activity  . Alcohol use: Yes    Alcohol/week: 1.0 standard drink    Types: 1 Standard drinks or equivalent per week  . Drug use: Yes    Types: Marijuana    Comment: per pt "few times a year"  . Sexual activity: Yes    Partners: Male    Birth control/protection: Implant    Comment: sexually active w/ female husband  Other Topics Concern  . Not on file  Social History Narrative   Drinks about 12-16oz of coffee a day, occasionally drinks a tea.   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    Social Determinants of Health   Financial Resource Strain: Not on file  Food Insecurity: Not on file  Transportation Needs: Not on file  Physical Activity: Not on file  Stress: Not on file  Social Connections: Not on file  Intimate Partner Violence: Not on file    Family History  Problem Relation Age of Onset  . Migraines Father        cluster headache  . Hyperlipidemia Father   . Hypertension Father   . Cancer Mother 28       Breast  . Stroke Maternal Grandmother   . Diabetes Paternal Grandmother   . Diabetes Unknown        paternal relaitve  . Seizures Unknown        maternal relative  . Rheum arthritis Unknown        maternal relatives     Review of Systems  Constitutional: Negative.  Negative for fever and weight  loss.  HENT: Negative.  Negative for congestion and sore throat.   Respiratory: Negative.  Negative for cough and shortness of breath.   Cardiovascular: Negative for chest pain and palpitations.  Gastrointestinal: Negative.  Negative for abdominal pain, blood in stool, bowel incontinence, diarrhea, melena, nausea and vomiting.  Genitourinary: Negative for bladder incontinence, dysuria, flank pain, frequency, hematuria and pelvic pain.  Musculoskeletal: Positive for back pain.  Skin: Negative.  Negative for rash.  Neurological: Negative.  Negative for tingling, weakness, numbness, headaches and paresthesias.  All  other systems reviewed and are negative.   Today's Vitals   06/16/20 1016  BP: 114/76  Pulse: (!) 110  Resp: 16  Temp: 98 F (36.7 C)  TempSrc: Temporal  SpO2: 98%  Weight: 213 lb (96.6 kg)  Height: 5\' 3"  (1.6 m)   Body mass index is 37.73 kg/m. Wt Readings from Last 3 Encounters:  06/16/20 213 lb (96.6 kg)  04/29/20 217 lb (98.4 kg)  01/29/20 207 lb (93.9 kg)    Physical Exam Vitals reviewed.  Constitutional:      Appearance: Normal appearance. She is obese.  HENT:     Head: Normocephalic.  Eyes:     Extraocular Movements: Extraocular movements intact.     Conjunctiva/sclera: Conjunctivae normal.     Pupils: Pupils are equal, round, and reactive to light.  Cardiovascular:     Rate and Rhythm: Normal rate and regular rhythm.     Pulses: Normal pulses.     Heart sounds: Normal heart sounds.  Pulmonary:     Effort: Pulmonary effort is normal.     Breath sounds: Normal breath sounds.  Abdominal:     General: Bowel sounds are normal. There is no distension.     Palpations: Abdomen is soft.     Tenderness: There is no abdominal tenderness.  Musculoskeletal:     Cervical back: Normal range of motion and neck supple.     Lumbar back: Spasms and tenderness present. No bony tenderness. Decreased range of motion. Positive right straight leg raise test.  Skin:     General: Skin is warm and dry.     Capillary Refill: Capillary refill takes less than 2 seconds.     Comments: Old surgical scar lumbar area  Neurological:     General: No focal deficit present.     Mental Status: She is alert and oriented to person, place, and time.     Sensory: No sensory deficit.     Motor: No weakness.     Coordination: Coordination normal.     Deep Tendon Reflexes: Reflexes normal.  Psychiatric:        Mood and Affect: Mood normal.        Behavior: Behavior normal.     Results for orders placed or performed in visit on 06/16/20 (from the past 24 hour(s))  POCT urinalysis dipstick     Status: None   Collection Time: 06/16/20 10:20 AM  Result Value Ref Range   Color, UA yellow yellow   Clarity, UA clear clear   Glucose, UA negative negative mg/dL   Bilirubin, UA negative negative   Ketones, POC UA negative negative mg/dL   Spec Grav, UA 06/18/20 7.124 - 1.025   Blood, UA negative negative   pH, UA 7.0 5.0 - 8.0   Protein Ur, POC negative negative mg/dL   Urobilinogen, UA 0.2 0.2 or 1.0 E.U./dL   Nitrite, UA Negative Negative   Leukocytes, UA Negative Negative    ASSESSMENT & PLAN: Joan was seen today for back pain.  Diagnoses and all orders for this visit:  Musculoskeletal back pain -     ketorolac (TORADOL) injection 60 mg -     traMADol (ULTRAM) 50 MG tablet; Take 1 tablet (50 mg total) by mouth every 8 (eight) hours as needed for up to 5 days.  Back pain, unspecified back location, unspecified back pain laterality, unspecified chronicity -     POCT urinalysis dipstick    Patient Instructions       If you  have lab work done today you will be contacted with your lab results within the next 2 weeks.  If you have not heard from [place] then please contact us. The fastest way to get your results is to register for My Chart.   IF you received an x-ray today, you will receive an invoice from [facility]. Please contact [facility]  at [phone] with questions or concerns regarding your invoice.   IF you received labwork today, you will receive an invoice from [place]. Please contact [facility] at [phone] with questions or concerns regarding your invoice.   Our billing staff will not be able to assist you with questions regarding bills from these companies.  You will be contacted with the lab results as soon as they are available. The fastest way to get your results is to activate your My Chart account. Instructions are located on the last page of this paperwork. If you have not heard from [place] regarding the results in 2 weeks, please contact this office.     Acute Back Pain, Adult Acute back pain is sudden and usually short-lived. It is often caused by an injury to the muscles and tissues in the back. The injury may result from:  A muscle or ligament getting overstretched or torn (strained). Ligaments are tissues that connect bones to each other. Lifting something improperly can cause a back strain.  Wear and tear (degeneration) of the spinal disks. Spinal disks are circular tissue that provide cushioning between the bones of the spine (vertebrae).  Twisting motions, such as while playing sports or doing yard work.  A hit to the back.  Arthritis. You may have a physical exam, lab tests, and imaging tests to find the cause of your pain. Acute back pain usually goes away with rest and home care. Follow these instructions at home: Managing pain, stiffness, and swelling  Treatment may include medicines for pain and inflammation that are taken by mouth or applied to the skin, prescription pain medicine, or muscle relaxants. Take over-the-counter and prescription medicines only as told by your health care provider.  Your health care provider may recommend applying ice during the first 24-48 hours after your pain starts. To do this: ? Put ice in a plastic bag. ? Place a towel between your skin and the bag. ? Leave  the ice on for 20 minutes, 2-3 times a day.  If directed, apply heat to the affected area as often as told by your health care provider. Use the heat source that your health care provider recommends, such as a moist heat pack or a heating pad. ? Place a towel between your skin and the heat source. ? Leave the heat on for 20-30 minutes. ? Remove the heat if your skin turns bright red. This is especially important if you are unable to feel pain, heat, or cold. You have a greater risk of getting burned. Activity  Do not stay in bed. Staying in bed for more than 1-2 days can delay your recovery.  Sit up and stand up straight. Avoid leaning forward when you sit or hunching over when you stand. ? If you work at a desk, sit close to it so you do not need to lean over. Keep your chin tucked in. Keep your neck drawn back, and keep your elbows bent at a 90-degree angle (right angle). ? Sit high and close to the steering wheel when you drive. Add lower back (lumbar) support to your car seat, if needed.  Take short walks on even surfaces as soon as you are able. Try to increase the length of time you walk each day.  Do not sit, drive, or stand in one place for more than 30 minutes at a time. Sitting or standing for long periods of time can put stress on your back.  Do not drive or use heavy machinery while taking prescription pain medicine.  Use proper lifting techniques. When you bend and lift, use positions that put less stress on your back: ? Cobre your knees. ? Keep the load close to your body. ? Avoid twisting.  Exercise regularly as told by your health care provider. Exercising helps your back heal faster and helps prevent back injuries by keeping muscles strong and flexible.  Work with a physical therapist to make a safe exercise program, as recommended by your health care provider. Do any exercises as told by your physical therapist.   Lifestyle  Maintain a healthy weight. Extra weight puts  stress on your back and makes it difficult to have good posture.  Avoid activities or situations that make you feel anxious or stressed. Stress and anxiety increase muscle tension and can make back pain worse. Learn ways to manage anxiety and stress, such as through exercise. General instructions  Sleep on a firm mattress in a comfortable position. Try lying on your side with your knees slightly bent. If you lie on your back, put a pillow under your knees.  Follow your treatment plan as told by your health care provider. This may include: ? Cognitive or behavioral therapy. ? Acupuncture or massage therapy. ? Meditation or yoga. Contact a health care provider if:  You have pain that is not relieved with rest or medicine.  You have increasing pain going down into your legs or buttocks.  Your pain does not improve after 2 weeks.  You have pain at night.  You lose weight without trying.  You have a fever or chills. Get help right away if:  You develop new bowel or bladder control problems.  You have unusual weakness or numbness in your arms or legs.  You develop nausea or vomiting.  You develop abdominal pain.  You feel faint. Summary  Acute back pain is sudden and usually short-lived.  Use proper lifting techniques. When you bend and lift, use positions that put less stress on your back.  Take over-the-counter and prescription medicines and apply heat or ice as directed by your health care provider. This information is not intended to replace advice given to you by your health care provider. Make sure you discuss any questions you have with your health care provider. Document Revised: 12/12/2019 Document Reviewed: 12/12/2019 Elsevier Patient Education  2021 Elsevier Inc.      Edwina Barth, MD [facility]

## 2020-06-16 NOTE — Patient Instructions (Addendum)
If you have lab work done today you will be contacted with your lab results within the next 2 weeks.  If you have not heard from [place] then please contact us. The fastest way to get your results is to register for My Chart.   IF you received an x-ray today, you will receive an invoice from [facility]. Please contact [facility] at [phone] with questions or concerns regarding your invoice.   IF you received labwork today, you will receive an invoice from [place]. Please contact [facility] at [phone] with questions or concerns regarding your invoice.   Our billing staff will not be able to assist you with questions regarding bills from these companies.  You will be contacted with the lab results as soon as they are available. The fastest way to get your results is to activate your My Chart account. Instructions are located on the last page of this paperwork. If you have not heard from [place] regarding the results in 2 weeks, please contact this office.     Acute Back Pain, Adult Acute back pain is sudden and usually short-lived. It is often caused by an injury to the muscles and tissues in the back. The injury may result from:  A muscle or ligament getting overstretched or torn (strained). Ligaments are tissues that connect bones to each other. Lifting something improperly can cause a back strain.  Wear and tear (degeneration) of the spinal disks. Spinal disks are circular tissue that provide cushioning between the bones of the spine (vertebrae).  Twisting motions, such as while playing sports or doing yard work.  A hit to the back.  Arthritis. You may have a physical exam, lab tests, and imaging tests to find the cause of your pain. Acute back pain usually goes away with rest and home care. Follow these instructions at home: Managing pain, stiffness, and swelling  Treatment may include medicines for pain and inflammation that are taken by mouth or applied to  the skin, prescription pain medicine, or muscle relaxants. Take over-the-counter and prescription medicines only as told by your health care provider.  Your health care provider may recommend applying ice during the first 24-48 hours after your pain starts. To do this: ? Put ice in a plastic bag. ? Place a towel between your skin and the bag. ? Leave the ice on for 20 minutes, 2-3 times a day.  If directed, apply heat to the affected area as often as told by your health care provider. Use the heat source that your health care provider recommends, such as a moist heat pack or a heating pad. ? Place a towel between your skin and the heat source. ? Leave the heat on for 20-30 minutes. ? Remove the heat if your skin turns bright red. This is especially important if you are unable to feel pain, heat, or cold. You have a greater risk of getting burned. Activity  Do not stay in bed. Staying in bed for more than 1-2 days can delay your recovery.  Sit up and stand up straight. Avoid leaning forward when you sit or hunching over when you stand. ? If you work at a desk, sit close to it so you do not need to lean over. Keep your chin tucked in. Keep your neck drawn back, and keep your elbows bent at a 90-degree angle (right angle). ? Sit high and close to the steering wheel when you drive. Add lower back (lumbar) support to your car  seat, if needed.  Take short walks on even surfaces as soon as you are able. Try to increase the length of time you walk each day.  Do not sit, drive, or stand in one place for more than 30 minutes at a time. Sitting or standing for long periods of time can put stress on your back.  Do not drive or use heavy machinery while taking prescription pain medicine.  Use proper lifting techniques. When you bend and lift, use positions that put less stress on your back: ? Bend your knees. ? Keep the load close to your body. ? Avoid twisting.  Exercise regularly as told by your  health care provider. Exercising helps your back heal faster and helps prevent back injuries by keeping muscles strong and flexible.  Work with a physical therapist to make a safe exercise program, as recommended by your health care provider. Do any exercises as told by your physical therapist.   Lifestyle  Maintain a healthy weight. Extra weight puts stress on your back and makes it difficult to have good posture.  Avoid activities or situations that make you feel anxious or stressed. Stress and anxiety increase muscle tension and can make back pain worse. Learn ways to manage anxiety and stress, such as through exercise. General instructions  Sleep on a firm mattress in a comfortable position. Try lying on your side with your knees slightly bent. If you lie on your back, put a pillow under your knees.  Follow your treatment plan as told by your health care provider. This may include: ? Cognitive or behavioral therapy. ? Acupuncture or massage therapy. ? Meditation or yoga. Contact a health care provider if:  You have pain that is not relieved with rest or medicine.  You have increasing pain going down into your legs or buttocks.  Your pain does not improve after 2 weeks.  You have pain at night.  You lose weight without trying.  You have a fever or chills. Get help right away if:  You develop new bowel or bladder control problems.  You have unusual weakness or numbness in your arms or legs.  You develop nausea or vomiting.  You develop abdominal pain.  You feel faint. Summary  Acute back pain is sudden and usually short-lived.  Use proper lifting techniques. When you bend and lift, use positions that put less stress on your back.  Take over-the-counter and prescription medicines and apply heat or ice as directed by your health care provider. This information is not intended to replace advice given to you by your health care provider. Make sure you discuss any  questions you have with your health care provider. Document Revised: 12/12/2019 Document Reviewed: 12/12/2019 Elsevier Patient Education  2021 Elsevier Inc.  

## 2020-06-18 ENCOUNTER — Other Ambulatory Visit: Payer: Self-pay

## 2020-06-18 ENCOUNTER — Ambulatory Visit
Admission: RE | Admit: 2020-06-18 | Discharge: 2020-06-18 | Disposition: A | Discharge status: Home / Self Care | Payer: 59 | Source: Ambulatory Visit | Attending: Emergency Medicine | Admitting: Emergency Medicine

## 2020-06-18 VITALS — BP 148/98 | HR 103 | Temp 98.2°F | Resp 18

## 2020-06-18 DIAGNOSIS — M545 Low back pain, unspecified: Secondary | ICD-10-CM | POA: Diagnosis not present

## 2020-06-18 LAB — POCT URINALYSIS DIP (MANUAL ENTRY)
Bilirubin, UA: NEGATIVE
Blood, UA: NEGATIVE
Glucose, UA: NEGATIVE mg/dL
Ketones, POC UA: NEGATIVE mg/dL
Leukocytes, UA: NEGATIVE
Nitrite, UA: NEGATIVE
Protein Ur, POC: NEGATIVE mg/dL
Spec Grav, UA: 1.015 (ref 1.010–1.025)
Urobilinogen, UA: 0.2 E.U./dL
pH, UA: 7 (ref 5.0–8.0)

## 2020-06-18 MED ORDER — PREDNISONE 10 MG PO TABS: ORAL_TABLET | ORAL | 0 refills | Status: DC

## 2020-06-18 MED ORDER — TIZANIDINE HCL 4 MG PO TABS: 4.0000 mg | ORAL_TABLET | Freq: Four times a day (QID) | ORAL | 0 refills | Status: DC | PRN

## 2020-06-18 NOTE — ED Triage Notes (Signed)
Pt presents today with c/o of lower back pain x 3 days. She was seen by PCP on Tuesday for same.

## 2020-06-18 NOTE — ED Provider Notes (Signed)
[name]    CSN: [id number] Arrival date & time: 06/18/20  1253      History   Chief Complaint Chief Complaint  Patient presents with  . Back Pain    lower    HPI Angela Farrell is a 45 y.o. female presenting today for evaluation of back pain.  Reports back pain for 3 days.  Seen by PCP 3 days ago, was given Toradol and tramadol.  Reports continued back pain and has slightly worsened.  Pain radiates into flank and right groin.  Denies any urinary symptoms of dysuria, hematuria.  Denies any nausea or vomiting.  Oral intake at baseline.  Has history of back pain and discectomy, but that pain feels different than her prior back pain.  Denies radiation into extremities, does go slightly into upper glutes.  Denies numbness or tingling.  HPI  Past Medical History:  Diagnosis Date  . Allergy   . Anemia   . Anxiety   . Arthritis   . 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 Chronic headache disorder    s/p Meningitis x 2, tension- type plus allergic/sinus problems  . COVID-19 05/2020  . History of abnormal Pap smear   . Meningitis, viral At age 45 and at age 45  . Nasal fracture 2011  . Other and unspecified ovarian cysts 2011- pelvic CT   GYN- Dr. Chevis PrettyMezer  . Vaginal spotting 06/04/2019  . Vision abnormalities     Patient Active Problem List   Diagnosis Date Noted  . Pityriasis versicolor 08/12/2018  . Migraine 06/20/2018  . Depression 11/03/2014  . Anxiety disorder 01/18/2012  . Chronic anemia 10/14/2011  . Chronic headache disorder 04/18/2011  . Obesity 04/18/2011  . Chronic rhinitis 04/18/2011    Class: Chronic    Past Surgical History:  Procedure Laterality Date  . BACK SURGERY  02/09/15   L5 - S1 diskectomy  . FOOT SURGERY  s/p 2 procedures  . LAPAROSCOPY FOR ECTOPIC PREGNANCY  1998   Fallopian tube removed  . MOUTH SURGERY    . spinal injection  03/10/15  . SPINE SURGERY    . UTERINE ARTERY EMBOLIZATION      OB History   No  obstetric history on file.      Home Medications    Prior to Admission medications   Medication Sig Start Date End Date Taking? Authorizing Provider  predniSONE (DELTASONE) 10 MG tablet Begin with 6 tabs on day 1, 5 tab on day 2, 4 tab on day 3, 3 tab on day 4, 2 tab on day 5, 1 tab on day 6-take with food 06/18/20  Yes Valetta Mulroy C, PA-C  tiZANidine (ZANAFLEX) 4 MG tablet Take 1 tablet (4 mg total) by mouth every 6 (six) hours as needed for muscle spasms. 06/18/20  Yes Dajahnae Vondra C, PA-C  B Complex-C (SUPER B COMPLEX/VITAMIN C PO) Take by mouth.    [provider]  Calcium-Magnesium-Vitamin D (CALCIUM 1200+D3 PO) Take 1,200 Units by mouth.    [provider]  Cholecalciferol (D3-1000 PO) Take 2,000 Units by mouth.    [provider]  Coenzyme Q10 (CO Q 10 PO) Take 50 mg by mouth daily.    [provider]  etonogestrel (NEXPLANON) 68 MG IMPL implant Nexplanon 68 mg subdermal implant  Removal due 02/2019    [provider]  Galcanezumab-gnlm (EMGALITY) 120 MG/ML SOAJ Inject 1 pen into the skin every 30 (thirty) days. 02/18/20   Butch PennyMillikan, Megan, NP  GLUCOSAMINE-CHONDROITIN PO Take 1 tablet by mouth daily.    [provider]  Lutein 40 MG CAPS Take 40 mg by mouth daily.    [provider]  Magnesium 500 MG CAPS Take 500 mg by mouth 4 (four) times daily.    [provider]  Potassium Gluconate 595 MG CAPS Take 595 mg by mouth daily.    [provider]  rizatriptan (MAXALT-MLT) 10 MG disintegrating tablet TAKE 1 TABLET BY MOUTH AS NEEDED FOR MIGRAINE. MAY REPEAT IN 2 HOURS IF NEEDED 02/18/20   Butch Penny, NP  topiramate (TOPAMAX) 100 MG tablet Take 1 tablet (100 mg total) by mouth 2 (two) times daily. 02/18/20   Butch Penny, NP  traMADol (ULTRAM) 50 MG tablet Take 1 tablet (50 mg total) by mouth every 8 (eight) hours as needed for up to 5 days. 06/16/20 06/21/20  Georgina Quint, MD  valACYclovir  (VALTREX) 500 MG tablet Take 1 tablet (500 mg total) by mouth 2 (two) times daily. 06/04/19   Randall Hiss, MD  vitamin A 8000 UNIT capsule Take 8,000 Units by mouth daily.    [provider]  vitamin C (ASCORBIC ACID) 500 MG tablet Take 500 mg by mouth daily.    [provider]    Family History Family History  Problem Relation Age of Onset  . Migraines Father        cluster headache  . Hyperlipidemia Father   . Hypertension Father   . Cancer Mother 45       Breast  . Stroke Maternal Grandmother   . Diabetes Paternal Grandmother   . Diabetes Other        paternal relaitve  . Seizures Other        maternal relative  . Rheum arthritis Other        maternal relatives    Social History Social History   Tobacco Use  . Smoking status: Former Smoker    Years: 20.00    Types: Cigarettes  . Smokeless tobacco: Never Used  Vaping Use  . Vaping Use: Never used  Substance Use Topics  . Alcohol use: Yes    Alcohol/week: 1.0 standard drink    Types: 1 Standard drinks or equivalent per week  . Drug use: Yes    Types: Marijuana    Comment: per pt "few times a year"     Allergies   Adhesive [tape]   Review of Systems Review of Systems  Constitutional: Negative for fatigue and fever.  Eyes: Negative for visual disturbance.  Respiratory: Negative for shortness of breath.   Cardiovascular: Negative for chest pain.  Gastrointestinal: Negative for abdominal pain, nausea and vomiting.  Musculoskeletal: Positive for back pain and myalgias. Negative for arthralgias and joint swelling.  Skin: Negative for color change, rash and wound.  Neurological: Negative for dizziness, weakness, light-headedness and headaches.     Physical Exam Triage Vital Signs ED Triage Vitals  Enc Vitals Group     BP 06/18/20 1309 (!) 148/98     Pulse Rate 06/18/20 1309 (!) 103     Resp 06/18/20 1309 18     Temp 06/18/20 1309 98.2 F (36.8 C)     Temp Source 06/18/20 1309  Oral     SpO2 06/18/20 1309 98 %     Weight --      Height --      Head Circumference --      Peak Flow --  Pain Score 06/18/20 1305 10     Pain Loc --      Pain Edu? --      Excl. in GC? --    No data found.  Updated Vital Signs BP (!) 148/98 (BP Location: Left Arm)   Pulse (!) 103   Temp 98.2 F (36.8 C) (Oral)   Resp 18   SpO2 98%   Visual Acuity Right Eye Distance:   Left Eye Distance:   Bilateral Distance:    Right Eye Near:   Left Eye Near:    Bilateral Near:     Physical Exam Vitals and nursing note reviewed.  Constitutional:      Appearance: She is well-developed.     Comments: No acute distress  HENT:     Head: Normocephalic and atraumatic.     Nose: Nose normal.  Eyes:     Conjunctiva/sclera: Conjunctivae normal.  Cardiovascular:     Rate and Rhythm: Normal rate.  Pulmonary:     Effort: Pulmonary effort is normal. No respiratory distress.  Abdominal:     General: There is no distension.  Musculoskeletal:        General: Normal range of motion.     Cervical back: Neck supple.     Comments: Nontender palpation along thoracic, lumbar spine midline, no palpable deformity or step-off, no reproducible tenderness to palpation along right lumbar/thoracic musculature or extending into flank  Moving all extremities appropriately  Skin:    General: Skin is warm and dry.  Neurological:     Mental Status: She is alert and oriented to person, place, and time.      UC Treatments / Results  Labs (all labs ordered are listed, but only abnormal results are displayed) Labs Reviewed  POCT URINALYSIS DIP (MANUAL ENTRY)    EKG   Radiology No results found.  Procedures Procedures (including critical care time)  Medications Ordered in UC Medications - No data to display  Initial Impression / Assessment and Plan / UC Course  I have reviewed the triage vital signs and the nursing notes.  Pertinent labs & imaging results that were available during  my care of the patient were reviewed by me and considered in my medical decision making (see chart for details).     UA unremarkable, negative hemoglobin, negative leuks and nitrites, less suspicious of underlying stone or UTI.  Opting to proceed with continued treatment for MSK cause of back pain, no new mechanism of injury, trial of prednisone taper x6 days, supplement tizanidine, ice and heat and encouraged to follow-up with sports medicine/orthopedics if persisting, if pain worsening/changing to follow-up in emergency room for further work-up/imaging given pain was not reproducible.   Discussed strict return precautions. Patient verbalized understanding and is agreeable with plan.  Final Clinical Impressions(s) / UC Diagnoses   Final diagnoses:  Acute bilateral low back pain without sciatica     Discharge Instructions     Plan prednisone taper over the next 6 days-begin with 6 tablets, decrease by 1 tablet each day until complete-6, 5, 4, 3, 2, 1-take with food and earlier in the day if possible Supplement with tizanidine at home/bedtime-this is muscle relaxer, may cause drowsiness Continue to alternate ice and heat Gentle stretching and low impact activity Follow-up if not improving or worsening    ED Prescriptions    Medication Sig Dispense Auth. Provider   predniSONE (DELTASONE) 10 MG tablet Begin with 6 tabs on day 1, 5 tab on day 2, 4 tab  on day 3, 3 tab on day 4, 2 tab on day 5, 1 tab on day 6-take with food 21 tablet Delisa Finck C, PA-C   tiZANidine (ZANAFLEX) 4 MG tablet Take 1 tablet (4 mg total) by mouth every 6 (six) hours as needed for muscle spasms. 30 tablet Knowledge Escandon, [place] C, PA-C     PDMP not reviewed this encounter.   Lew Dawes, [place] 06/18/20 1539

## 2020-06-18 NOTE — Discharge Instructions (Addendum)
Plan prednisone taper over the next 6 days-begin with 6 tablets, decrease by 1 tablet each day until complete-6, 5, 4, 3, 2, 1-take with food and earlier in the day if possible Supplement with tizanidine at home/bedtime-this is muscle relaxer, may cause drowsiness Continue to alternate ice and heat Gentle stretching and low impact activity Follow-up if not improving or worsening

## 2020-06-22 ENCOUNTER — Other Ambulatory Visit: Payer: Self-pay | Admitting: Infectious Disease

## 2020-07-06 ENCOUNTER — Encounter: Payer: Self-pay | Admitting: Gastroenterology

## 2020-07-15 IMAGING — US US ABDOMEN LIMITED
1 series · 14 of 25 positions shown · non-contrast
Comparison: 01/09/2017

CLINICAL DATA: Elevated liver function tests. Hyperlipidemia. Class
2 obesity.

EXAM:
ULTRASOUND ABDOMEN LIMITED RIGHT UPPER QUADRANT

[Series 1: us abdomen limited · 0.23mm/px · 14 of 45 slices shown]
[im 1/45]
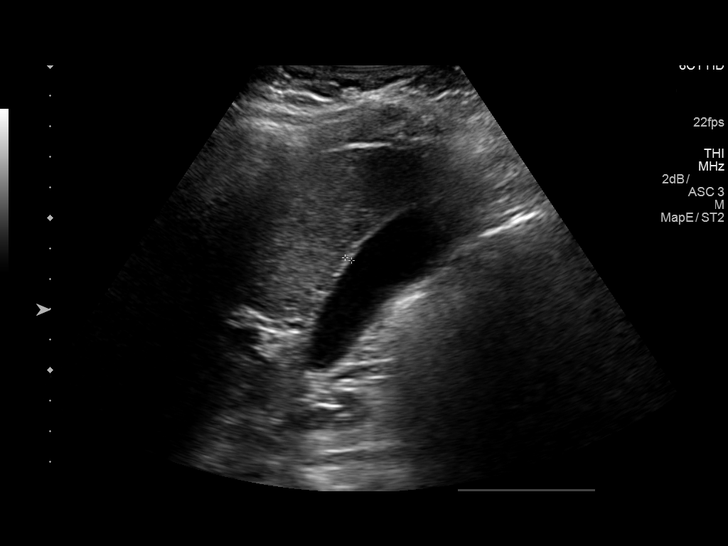
[im 4/45]
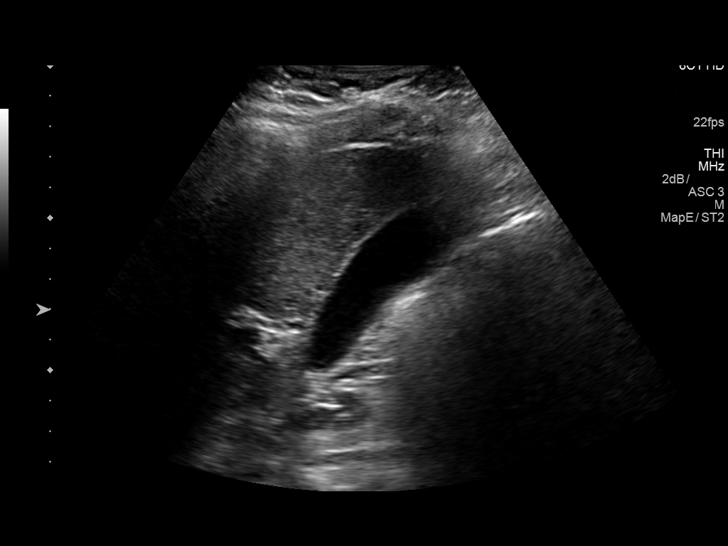
[im 8/45]
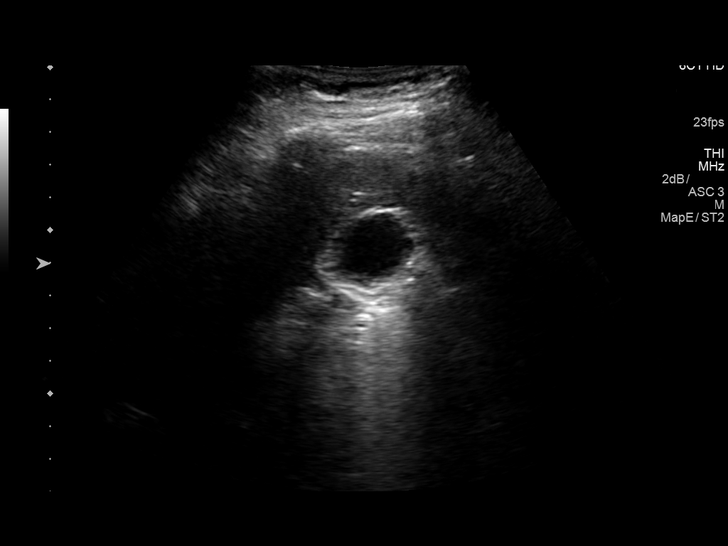
[im 12/45]
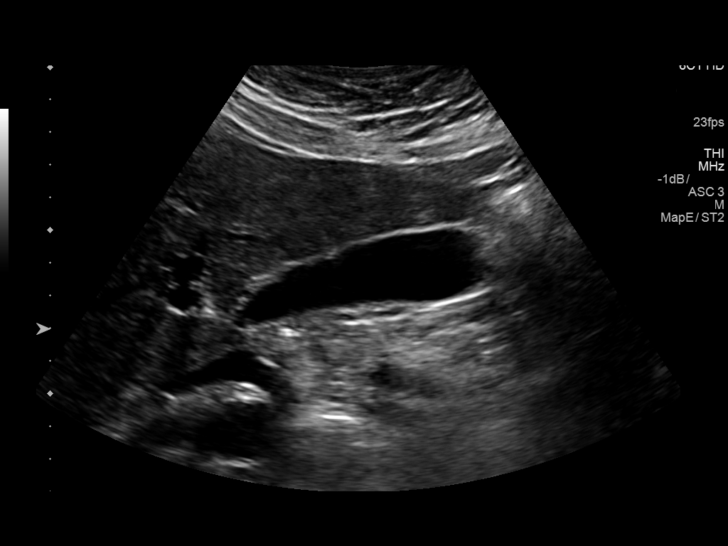
[im 15/45]
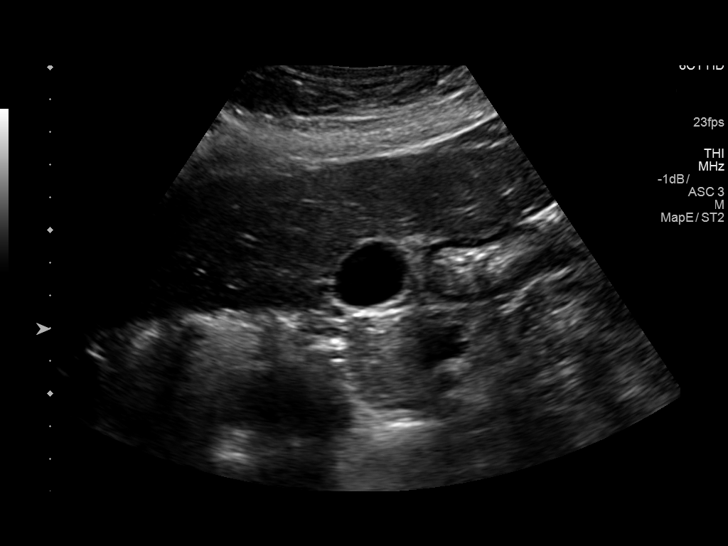
[im 17/45]
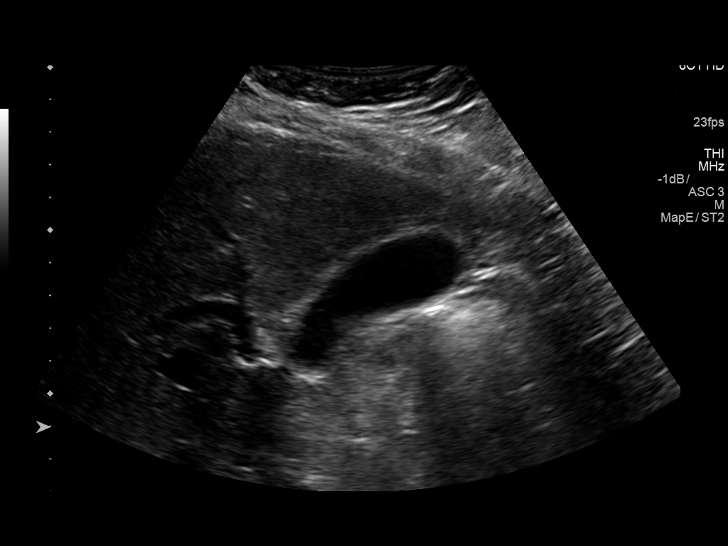
[im 21/45]
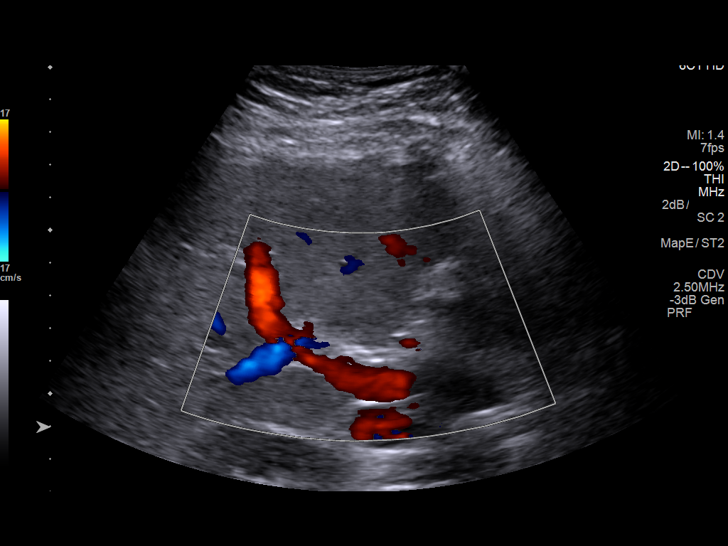
[im 24/45]
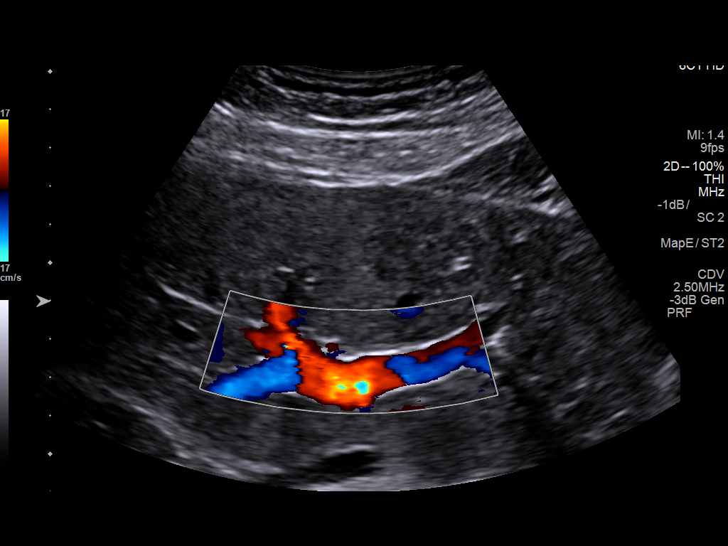
[im 28/45]
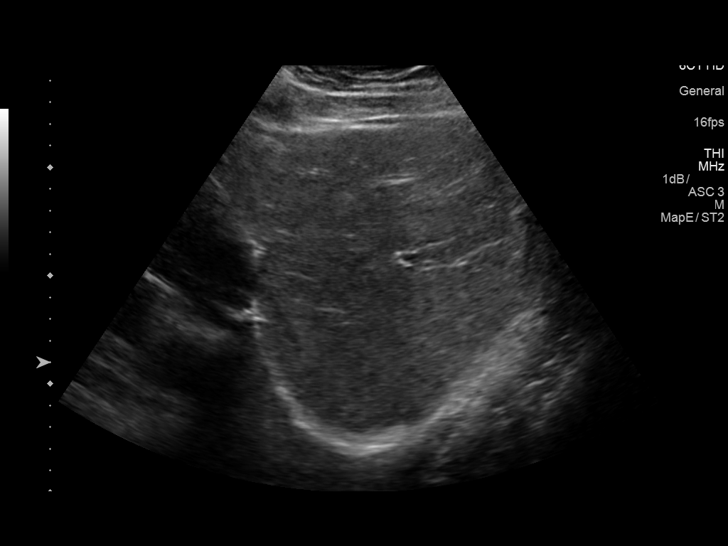
[im 30/45]
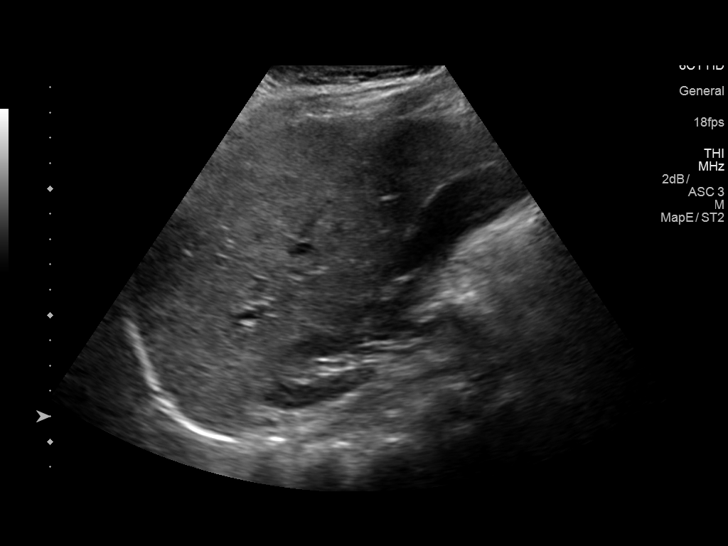
[im 34/45]
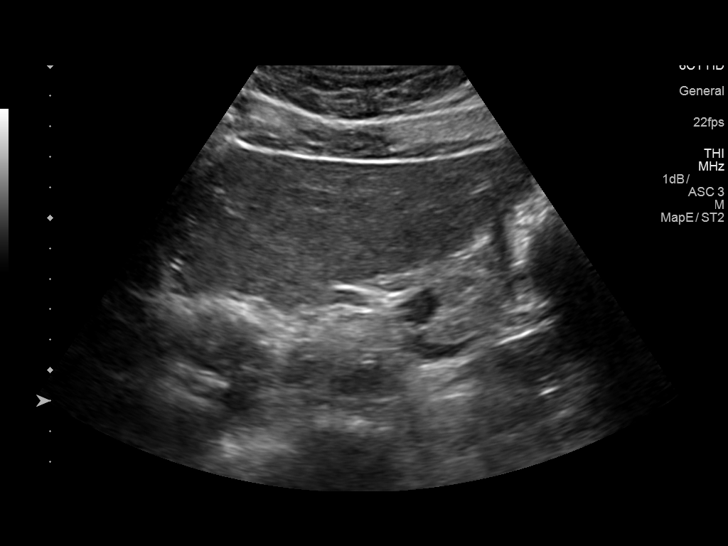
[im 37/45]
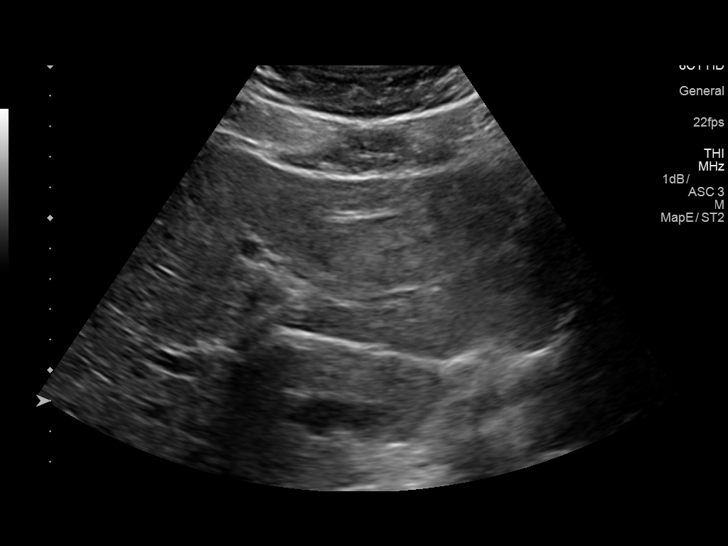
[im 41/45]
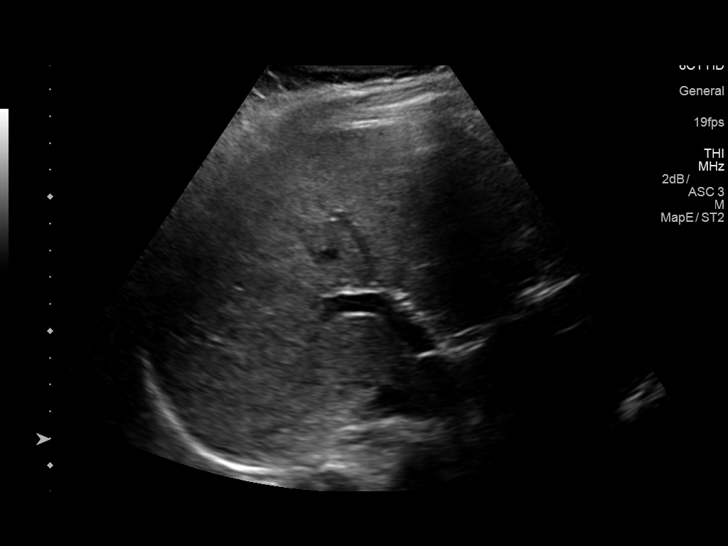
[im 45/45]
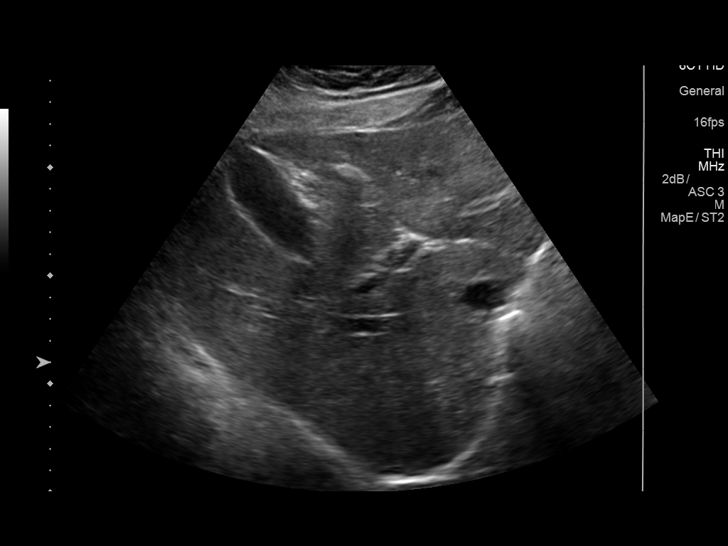

[14 of 25 positions shown; findings below may reference images not displayed]

FINDINGS: Gallbladder:

No gallstones or wall thickening visualized. No sonographic Murphy
sign noted by sonographer.

Common bile duct:

Diameter: 2 mm, within normal limits.

Liver:

No focal lesion identified. Within normal limits in parenchymal
echogenicity. Portal vein is patent on color Doppler imaging with
normal direction of blood flow towards the liver.
IMPRESSION: Negative.  No hepatobiliary abnormality identified.

## 2020-07-16 ENCOUNTER — Other Ambulatory Visit: Payer: Self-pay | Admitting: Infectious Disease

## 2020-08-13 ENCOUNTER — Other Ambulatory Visit: Payer: Self-pay | Admitting: Infectious Disease

## 2020-08-16 ENCOUNTER — Encounter: Payer: Self-pay | Admitting: Infectious Disease

## 2020-08-16 ENCOUNTER — Ambulatory Visit (INDEPENDENT_AMBULATORY_CARE_PROVIDER_SITE_OTHER): Payer: 59 | Admitting: Infectious Disease

## 2020-08-16 ENCOUNTER — Telehealth: Payer: Self-pay

## 2020-08-16 ENCOUNTER — Other Ambulatory Visit: Payer: Self-pay

## 2020-08-16 DIAGNOSIS — G8929 Other chronic pain: Secondary | ICD-10-CM | POA: Diagnosis not present

## 2020-08-16 DIAGNOSIS — R519 Headache, unspecified: Secondary | ICD-10-CM | POA: Diagnosis not present

## 2020-08-16 DIAGNOSIS — U071 COVID-19: Secondary | ICD-10-CM

## 2020-08-16 DIAGNOSIS — G032 Benign recurrent meningitis [Mollaret]: Secondary | ICD-10-CM

## 2020-08-16 HISTORY — DX: COVID-19: U07.1

## 2020-08-16 MED ORDER — VALACYCLOVIR HCL 500 MG PO TABS: 500.0000 mg | ORAL_TABLET | Freq: Two times a day (BID) | ORAL | 11 refills | Status: DC

## 2020-08-16 NOTE — Telephone Encounter (Signed)
Patient has virtual appointment today  

## 2020-08-16 NOTE — Telephone Encounter (Signed)
Received refill request for Valtrex. Patient was being treated for herpes simplex meningitis and overdue for follow up visit. Patient accepts virtual visit with Dr. Daiva Eves today. Valarie Cones

## 2020-08-16 NOTE — Progress Notes (Addendum)
Virtual Visit via Video Note  I connected with [name] on 08/16/20 at  9:45 AM EDT by a video enabled telemedicine application and verified that I am speaking with the correct person using two identifiers.  Location: Patient: Angela Farrell Provider: [facility]   I discussed the limitations of evaluation and management by telemedicine and the availability of Angela person appointments. The patient expressed understanding and agreed to proceed.  History of Present Illness:   Angela Farrell is a [age]-year-old African-American woman whom I met Angela 2010.  At that time she had carried a diagnosis of "aseptic meningitis and had undergone a lumbar puncture previously it was a long hospital.  It had not grown any organisms but did not sent been sent for PCR testing.  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  Indeed her HSV-2 PCR was positive Angela 2010 as it was again Angela 2013 on a different hospital admission.  She has been on Valtrex at times to prevent HSV 2 meningitis.  It is interesting because having prophylactic Valtrex to patients with genital herpes does reduce the frequency of genital outbreaks but Angela setting of HSV-2 meningitis the data is not clear that it helps and there is even some data to suggest it may increase the frequency of recurrent meningitis.  However Angela the case of Chyler she has felt very strongly that when she is not taking her Valtrex which she takes at a dose of 500 twice daily it is very soon after that she has an recurrence of HSV-2 meningitis.  Does have comorbid migraine headaches but she can sometimes tell the difference Angela particular once she starts having neck stiffness and she also at times will have fevers  [place] a year ago.  Since then she has been taking the Valtrex 500 mg twice daily and has not had any episodes of meningitis.  She distinguishes the meningitis from her normal  migraines Angela the she will have a stiff neck that hurts when she has onset of this.  She did succumb to COVID-19 infection roughly a month ago symptoms of sinus congestion fevers headaches then followed by and did have severe migraines after that but no flare of her HSV-2 meningitis.    She is otherwise Angela good spirits and would like to follow-up Angela a years time. Past Medical History:  Diagnosis Date  . Allergy   . Anemia   . Anxiety   . Arthritis   . 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 Chronic headache disorder    s/p Meningitis x 2, tension- type plus allergic/sinus problems  . COVID 08/16/2020  . COVID-19 05/2020  . History of abnormal Pap smear   . Meningitis, viral At age 45 and at age 45  . Nasal fracture 2011  . Other and unspecified ovarian cysts 2011- pelvic CT   GYN- Dr. Carren Rang  . Vaginal spotting 06/04/2019  . Vision abnormalities     Past Surgical History:  Procedure Laterality Date  . BACK SURGERY  02/09/15   L5 - S1 diskectomy  . FOOT SURGERY  s/p 2 procedures  . LAPAROSCOPY FOR ECTOPIC PREGNANCY  1998   Fallopian tube removed  . MOUTH SURGERY    . spinal injection  03/10/15  . SPINE SURGERY    . UTERINE ARTERY EMBOLIZATION      Family History  Problem Relation Age of Onset  . Migraines Father  cluster headache  . Hyperlipidemia Father   . Hypertension Father   . Cancer Mother 70       Breast  . Stroke Maternal Grandmother   . Diabetes Paternal Grandmother   . Diabetes Other        paternal relaitve  . Seizures Other        maternal relative  . Rheum arthritis Other        maternal relatives      Social History   Socioeconomic History  . Marital status: Married    Spouse name: Linna Hoff  . Number of children: Not on file  . Years of education: Not on file  . Highest education level: Not on file  Occupational History  . Occupation: Hospital doctor: [employer]  Tobacco Use  . Smoking status: Former Smoker     Years: 20.00    Types: Cigarettes  . Smokeless tobacco: Never Used  Vaping Use  . Vaping Use: Never used  Substance and Sexual Activity  . Alcohol use: Yes    Alcohol/week: 1.0 standard drink    Types: 1 Standard drinks or equivalent per week  . Drug use: Yes    Types: Marijuana    Comment: per pt "few times a year"  . Sexual activity: Yes    Partners: Male    Birth control/protection: Implant    Comment: sexually active w/ female husband  Other Topics Concern  . Not on file  Social History Narrative   Drinks about 12-16oz of coffee a day, occasionally drinks a tea.   [employer] Teaching laboratory technician as Administrator.    Social Determinants of Health   Financial Resource Strain: Not on file  Food Insecurity: Not on file  Transportation Needs: Not on file  Physical Activity: Not on file  Stress: Not on file  Social Connections: Not on file    Allergies  Allergen Reactions  . Adhesive [Tape] Rash     Current Outpatient Medications:  .  B Complex-C (SUPER B COMPLEX/VITAMIN C PO), Take by mouth., Disp: , Rfl:  .  Calcium-Magnesium-Vitamin D (CALCIUM 1200+D3 PO), Take 1,200 Units by mouth., Disp: , Rfl:  .  Cholecalciferol (D3-1000 PO), Take 2,000 Units by mouth., Disp: , Rfl:  .  Coenzyme Q10 (CO Q 10 PO), Take 50 mg by mouth daily., Disp: , Rfl:  .  etonogestrel (NEXPLANON) 68 MG IMPL implant, Nexplanon 68 mg subdermal implant  Removal due 02/2019, Disp: , Rfl:  .  Galcanezumab-gnlm (EMGALITY) 120 MG/ML SOAJ, Inject 1 pen into the skin every 30 (thirty) days., Disp: 1 mL, Rfl: 11 .  GLUCOSAMINE-CHONDROITIN PO, Take 1 tablet by mouth daily., Disp: , Rfl:  .  Lutein 40 MG CAPS, Take 40 mg by mouth daily., Disp: , Rfl:  .  Magnesium 500 MG CAPS, Take 500 mg by mouth 4 (four) times daily., Disp: , Rfl:  .  Potassium Gluconate 595 MG CAPS, Take 595 mg by mouth daily., Disp: , Rfl:  .  predniSONE (DELTASONE) 10 MG tablet, Begin with 6 tabs on day 1, 5 tab on day 2, 4 tab on day 3, 3 tab on day 4, 2  tab on day 5, 1 tab on day 6-take with food, Disp: 21 tablet, Rfl: 0 .  rizatriptan (MAXALT-MLT) 10 MG disintegrating tablet, TAKE 1 TABLET BY MOUTH AS NEEDED FOR MIGRAINE. MAY REPEAT Angela 2 HOURS IF NEEDED, Disp: 9 tablet, Rfl: 11 .  tiZANidine (ZANAFLEX) 4 MG tablet, Take 1  tablet (4 mg total) by mouth every 6 (six) hours as needed for muscle spasms., Disp: 30 tablet, Rfl: 0 .  topiramate (TOPAMAX) 100 MG tablet, Take 1 tablet (100 mg total) by mouth 2 (two) times daily., Disp: 60 tablet, Rfl: 12 .  valACYclovir (VALTREX) 500 MG tablet, Take 1 tablet (500 mg total) by mouth 2 (two) times daily., Disp: 60 tablet, Rfl: 11 .  vitamin A 8000 UNIT capsule, Take 8,000 Units by mouth daily., Disp: , Rfl:  .  vitamin C (ASCORBIC ACID) 500 MG tablet, Take 500 mg by mouth daily., Disp: , Rfl:    ROS as above otherwise 12 point ROS is negative. Observations/Objective:  Appeared comfortable on video screen was walking outside was no acute distress  Assessment and Plan:  Mollaret's  meningitis: Continue prophylactic Valtrex 500 twice daily as it does use the frequency of her recurrent meningitis  Migraine headaches followed by neurology.  No graft recent COVID-19 infection seems to have recovered   Follow Up Instructions:    I discussed the assessment and treatment plan with the patient. The patient was provided an opportunity to ask questions and all were answered. The patient agreed with the plan and demonstrated an understanding of the instructions.   The patient was advised to call back or seek an Angela-person evaluation if the symptoms worsen or if the condition fails to improve as anticipated.  I provided 30 minutes Angela face to face counselling of the pateint review of her radiographs, reads micro and lab data and Angela coordination of her care.  Alcide Evener, MD

## 2020-08-23 ENCOUNTER — Other Ambulatory Visit: Payer: Self-pay | Admitting: Obstetrics and Gynecology

## 2020-08-23 DIAGNOSIS — R928 Other abnormal and inconclusive findings on diagnostic imaging of breast: Secondary | ICD-10-CM

## 2020-08-24 ENCOUNTER — Encounter: Payer: Self-pay | Admitting: *Deleted

## 2020-08-24 ENCOUNTER — Telehealth: Payer: Self-pay | Admitting: *Deleted

## 2020-08-24 DIAGNOSIS — G43009 Migraine without aura, not intractable, without status migrainosus: Secondary | ICD-10-CM

## 2020-08-24 NOTE — Telephone Encounter (Signed)
Per my chart with patient she has only been getting 4 Rizatriptan a month due to insurance. I found no notes indicating that she can only get 4/month. She is asking to get full Rx amount, stated her headaches aren't more frequent but are lasting longer. Called [facility], closed for lunch. Will call back.

## 2020-08-24 NOTE — Telephone Encounter (Signed)
Called [facility], spoke with Dennard Nip who stated when they run RX it says max quantity of 4 tabs for 2 day supply.  He stated if he runs Rx for 9 tabs/30 days it won't go through. They have been running it through for 4 tabs for 2 days without issue.  Sent patient my chart to get current insurance information.

## 2020-08-24 NOTE — Telephone Encounter (Signed)
[employer] PA, key: [id number], G43.009, continuation of therapy. Your information has been sent to [facility]

## 2020-08-24 NOTE — Telephone Encounter (Signed)
[employer] Approved  Request Reference Number: [id number]. EMGALITY INJ 120MG /ML is approved through 08/24/2021. Sent my chart to inform.

## 2020-08-25 MED ORDER — UBRELVY 50 MG PO TABS: ORAL_TABLET | ORAL | 5 refills | Status: DC

## 2020-08-25 NOTE — Telephone Encounter (Signed)
Bernita Raisin ordered for patient Maxalt discontinued

## 2020-08-25 NOTE — Telephone Encounter (Signed)
Received insurance info and will do PA for Rizatriptan. PA, key:  [id number], [id number]. Your information has been sent to [facility].

## 2020-08-25 NOTE — Telephone Encounter (Signed)
This request was denied because you did not meet the following clinical requirements: The requested medication and/or diagnosis are not a covered benefit and excluded from coverage in accordance with the terms and conditions of your plan benefit. Therefore, the request has been administratively denied. [facility] cannot perform this prior authorization request because prior authorization is not required for the requested drug and medication quantities above the benefit limit are excludedunder the plan. For additional information on plan benefits, the member can contact Member Services by calling the number on the back of their ID card.

## 2020-08-25 NOTE — Telephone Encounter (Signed)
Called [facility] help desk, spoke with Archie Patten who stated Rizatriptan was denied because maximum quantity per Rx is 4 for 30 days with her insurance.  Will send to provider for alternatives.

## 2020-08-26 ENCOUNTER — Encounter: Payer: Self-pay | Admitting: *Deleted

## 2020-08-26 ENCOUNTER — Telehealth: Payer: Self-pay | Admitting: *Deleted

## 2020-08-26 NOTE — Telephone Encounter (Signed)
Bernita Raisin PA, key:  [id number], tried/failed propranolol, AImovig, Rizatriptan. Your information has been sent to [facility]

## 2020-08-26 NOTE — Telephone Encounter (Signed)
Bernita Raisin was denied because you did not meet the following clinical requirements:  The request for coverage for Ubrelvy 50mg  Tablet, use as directed (15 per month), is denied. This medicine is covered only if: All of the following: You have a history of therapeutic failure (after at least 3 migraine episodes and a minimum of a 30-day trial), contraindication or intolerance to <*two/one*> of the following (name and date tried required): (A) Almotriptan (Axert). (B) Eletriptan (Relpax). (C) Frovatriptan (Frova). (D) Naratriptan (Amerge). (E) Rizatriptan (Maxalt/Maxalt MLT). (F) Sumatriptan (Imitrex). (G) Zolmitriptan (Zomig). The information provided does not show that you meet the criteria listed above. *Please note: These products may require prior authorization. *Please note: This medication cannot be further reviewed for the quantity limit until the above criteria have been addressed.  Sent my chart advising she use savings card.

## 2020-08-29 ENCOUNTER — Other Ambulatory Visit: Payer: Self-pay | Admitting: Emergency Medicine

## 2020-08-29 DIAGNOSIS — M62838 Other muscle spasm: Secondary | ICD-10-CM

## 2020-08-30 ENCOUNTER — Other Ambulatory Visit: Payer: Self-pay | Admitting: Emergency Medicine

## 2020-09-02 ENCOUNTER — Encounter: Payer: Self-pay | Admitting: Internal Medicine

## 2020-09-02 ENCOUNTER — Ambulatory Visit (INDEPENDENT_AMBULATORY_CARE_PROVIDER_SITE_OTHER)
Admission: RE | Admit: 2020-09-02 | Discharge: 2020-09-02 | Disposition: A | Discharge status: Home / Self Care | Payer: 59 | Source: Ambulatory Visit | Attending: Internal Medicine | Admitting: Internal Medicine

## 2020-09-02 ENCOUNTER — Ambulatory Visit: Payer: 59 | Admitting: Internal Medicine

## 2020-09-02 ENCOUNTER — Other Ambulatory Visit: Payer: Self-pay

## 2020-09-02 VITALS — BP 140/92 | HR 98 | Temp 99.0°F | Ht 63.0 in | Wt 216.8 lb

## 2020-09-02 DIAGNOSIS — R600 Localized edema: Secondary | ICD-10-CM

## 2020-09-02 DIAGNOSIS — F17200 Nicotine dependence, unspecified, uncomplicated: Secondary | ICD-10-CM | POA: Insufficient documentation

## 2020-09-02 DIAGNOSIS — A6000 Herpesviral infection of urogenital system, unspecified: Secondary | ICD-10-CM | POA: Insufficient documentation

## 2020-09-02 DIAGNOSIS — G44009 Cluster headache syndrome, unspecified, not intractable: Secondary | ICD-10-CM | POA: Insufficient documentation

## 2020-09-02 DIAGNOSIS — I10 Essential (primary) hypertension: Secondary | ICD-10-CM | POA: Diagnosis not present

## 2020-09-02 DIAGNOSIS — Z8659 Personal history of other mental and behavioral disorders: Secondary | ICD-10-CM | POA: Insufficient documentation

## 2020-09-02 DIAGNOSIS — F419 Anxiety disorder, unspecified: Secondary | ICD-10-CM

## 2020-09-02 DIAGNOSIS — R609 Edema, unspecified: Secondary | ICD-10-CM | POA: Diagnosis not present

## 2020-09-02 DIAGNOSIS — Z8759 Personal history of other complications of pregnancy, childbirth and the puerperium: Secondary | ICD-10-CM | POA: Insufficient documentation

## 2020-09-02 LAB — CBC WITH DIFFERENTIAL/PLATELET
Basophils Absolute: 0.1 10*3/uL (ref 0.0–0.1)
Basophils Relative: 1.2 % (ref 0.0–3.0)
Eosinophils Absolute: 0.1 10*3/uL (ref 0.0–0.7)
Eosinophils Relative: 1.3 % (ref 0.0–5.0)
HCT: 35.9 % — ABNORMAL LOW (ref 36.0–46.0)
Hemoglobin: 11.8 g/dL — ABNORMAL LOW (ref 12.0–15.0)
Lymphocytes Relative: 39.8 % (ref 12.0–46.0)
Lymphs Abs: 2.7 10*3/uL (ref 0.7–4.0)
MCHC: 33 g/dL (ref 30.0–36.0)
MCV: 89.3 fl (ref 78.0–100.0)
Monocytes Absolute: 0.6 10*3/uL (ref 0.1–1.0)
Monocytes Relative: 8.4 % (ref 3.0–12.0)
Neutro Abs: 3.3 10*3/uL (ref 1.4–7.7)
Neutrophils Relative %: 49.3 % (ref 43.0–77.0)
Platelets: 281 10*3/uL (ref 150.0–400.0)
RBC: 4.02 Mil/uL (ref 3.87–5.11)
RDW: 13.8 % (ref 11.5–15.5)
WBC: 6.7 10*3/uL (ref 4.0–10.5)

## 2020-09-02 LAB — BRAIN NATRIURETIC PEPTIDE: Pro B Natriuretic peptide (BNP): 49 pg/mL (ref 0.0–100.0)

## 2020-09-02 MED ORDER — HYDROCHLOROTHIAZIDE 12.5 MG PO CAPS: 12.5000 mg | ORAL_CAPSULE | Freq: Every day | ORAL | 3 refills | Status: DC

## 2020-09-02 NOTE — Assessment & Plan Note (Signed)
Tried to [employer] pt, declines new tx or counseling,  to f/u any worsening symptoms or concerns

## 2020-09-02 NOTE — Progress Notes (Signed)
[name], female   DOB: 02/28/1976, 45 y.o.   MRN: [id number]        Chief Complaint: "swollen ankles"       HPI:  Angela Farrell is a 45 y.o. female here with painless 1 wk worsening leg swelling left slightly more than right, constant, worse in the PM, slightly better in the PM without increased oral fluids, but is concerned about heart disease.   Pt denies chest pain, increased sob or doe, wheezing, orthopnea, PND, palpitations, dizziness or syncope.   Pt denies polydipsia, polyuria, or new focal neuro s/s.  No overt bleeding   Denies hyper or hypo thyroid symptoms such as voice, skin or hair change.  Denies worsening depressive symptoms, suicidal ideation, or panic; has ongoing anxiety.  BP has been mildly elevated recently in the past 2 mo.  Incidentally has been notified she has slight abnormal mammogram film and asked to return for f/u films, appt not until June 13 and quite concerned about this.       Wt Readings from Last 3 Encounters:  09/02/20 216 lb 12.8 oz (98.3 kg)  06/16/20 213 lb (96.6 kg)  04/29/20 217 lb (98.4 kg)   BP Readings from Last 3 Encounters:  09/02/20 (!) 140/92  06/18/20 (!) 148/98  06/16/20 114/76         Past Medical History:  Diagnosis Date  . Allergy   . Anemia   . Anxiety   . Arthritis   . 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 Chronic headache disorder    s/p Meningitis x 2, tension- type plus allergic/sinus problems  . COVID 08/16/2020  . COVID-19 05/2020  . History of abnormal Pap smear   . Meningitis, viral At age 38 and at age 33  . Nasal fracture 2011  . Other and unspecified ovarian cysts 2011- pelvic CT   GYN- Dr. Chevis Pretty  . Vaginal spotting 06/04/2019  . Vision abnormalities    Past Surgical History:  Procedure Laterality Date  . BACK SURGERY  02/09/15   L5 - S1 diskectomy  . FOOT SURGERY  s/p 2 procedures  . LAPAROSCOPY FOR ECTOPIC PREGNANCY  1998   Fallopian tube removed  . MOUTH SURGERY    .  spinal injection  03/10/15  . SPINE SURGERY    . UTERINE ARTERY EMBOLIZATION      reports that she has quit smoking. Her smoking use included cigarettes. She quit after 20.00 years of use. She has never used smokeless tobacco. She reports current alcohol use of about 1.0 standard drink of alcohol per week. She reports current drug use. Drug: Marijuana. family history includes Cancer (age of onset: 50) in her mother; Diabetes in her paternal grandmother and another family member; Hyperlipidemia in her father; Hypertension in her father; Migraines in her father; Rheum arthritis in an other family member; Seizures in an other family member; Stroke in her maternal grandmother. Allergies  Allergen Reactions  . Adhesive [Tape] Rash  . Other Rash   Current Outpatient Medications on File Prior to Visit  Medication Sig Dispense Refill  . B Complex-C (SUPER B COMPLEX/VITAMIN C PO) Take by mouth.    . Calcium-Magnesium-Vitamin D (CALCIUM 1200+D3 PO) Take 1,200 Units by mouth.    . Cholecalciferol (D3-1000 PO) Take 2,000 Units by mouth.    . etonogestrel (NEXPLANON) 68 MG IMPL implant Nexplanon 68 mg subdermal implant  Removal due 02/2019    . Galcanezumab-gnlm (EMGALITY) 120 MG/ML SOAJ Inject 1 pen  into the skin every 30 (thirty) days. 1 mL 11  . Magnesium 500 MG CAPS Take 500 mg by mouth 4 (four) times daily.    Marland Kitchen topiramate (TOPAMAX) 100 MG tablet Take 1 tablet (100 mg total) by mouth 2 (two) times daily. 60 tablet 12  . Ubrogepant (UBRELVY) 50 MG TABS Take 50 mg by mouth as needed. May repeat once in two hours, no more than 2 tablets in 24 hours. 15 tablet 5  . valACYclovir (VALTREX) 500 MG tablet Take 1 tablet (500 mg total) by mouth 2 (two) times daily. 60 tablet 11  . vitamin A 8000 UNIT capsule Take 8,000 Units by mouth daily.    . vitamin C (ASCORBIC ACID) 500 MG tablet Take 500 mg by mouth daily.    . Coenzyme Q10 (CO Q 10 PO) Take 50 mg by mouth daily.    . cyclobenzaprine (FLEXERIL) 10 MG  tablet TAKE 1 TABLET BY MOUTH EVERYDAY AT BEDTIME 30 tablet 1  . GLUCOSAMINE-CHONDROITIN PO Take 1 tablet by mouth daily.    . Lutein 40 MG CAPS Take 40 mg by mouth daily.    . meloxicam (MOBIC) 15 MG tablet Take 1 tablet by mouth daily.    . Potassium Gluconate 595 MG CAPS Take 595 mg by mouth daily.    . predniSONE (DELTASONE) 10 MG tablet Begin with 6 tabs on day 1, 5 tab on day 2, 4 tab on day 3, 3 tab on day 4, 2 tab on day 5, 1 tab on day 6-take with food 21 tablet 0   No current facility-administered medications on file prior to visit.        ROS:  All others reviewed and negative.  Objective        PE:  BP (!) 140/92 (BP Location: Left Arm, Patient Position: Sitting, Cuff Size: Large)   Pulse 98   Temp 99 F (37.2 C) (Oral)   Ht 5\' 3"  (1.6 m)   Wt 216 lb 12.8 oz (98.3 kg)   SpO2 96%   BMI 38.40 kg/m                 Constitutional: Pt appears in NAD               HENT: Head: NCAT.                Right Ear: External ear normal.                 Left Ear: External ear normal.                Eyes: . Pupils are equal, round, and reactive to light. Conjunctivae and EOM are normal               Nose: without d/c or deformity               Neck: Neck supple. Gross normal ROM               Cardiovascular: Normal rate and regular rhythm.                 Pulmonary/Chest: Effort normal and breath sounds without rales or wheezing.                Abd:  Soft, NT, ND, + BS, no organomegaly               Neurological: Pt is alert. At baseline orientation, motor grossly intact  Skin: Skin is warm. No rashes, no other new lesions, LE edema - trace bilateral edema to knees left > right               Psychiatric: Pt behavior is normal without agitation   Micro: none  Cardiac tracings I have personally interpreted today:  none  Pertinent Radiological findings (summarize): none   Lab Results  Component Value Date   WBC 7.1 04/29/2020   HGB 12.3 04/29/2020   HCT 37.8  04/29/2020   PLT 318 04/29/2020   GLUCOSE 94 04/29/2020   CHOL 226 (H) 04/29/2020   TRIG 81 04/29/2020   HDL 59 04/29/2020   LDLCALC 153 (H) 04/29/2020   ALT 29 04/29/2020   AST 23 04/29/2020   NA 138 04/29/2020   K 4.2 04/29/2020   CL 104 04/29/2020   CREATININE 0.84 04/29/2020   BUN 7 04/29/2020   CO2 21 04/29/2020   TSH 0.847 02/15/2018   HGBA1C 5.2 04/29/2020   Assessment/Plan:  Angela Farrell is a 45 y.o. Black or African American [2] female with  has a past medical history of Allergy, Anemia, Anxiety, Arthritis, Benign recurrent meningitis (mollaret) (11/18/2018), Chronic headache disorder, COVID (08/16/2020), COVID-19 (05/2020), History of abnormal Pap smear, Meningitis, viral (At age 34 and at age 43), Nasal fracture (2011), Other and unspecified ovarian cysts (2011- pelvic CT), Vaginal spotting (06/04/2019), and Vision abnormalities.  Peripheral edema Etiology unclear, for labs and cxr, and f/u with PCP  HTN (hypertension) New onset, for hct 12.5 qd  Anxiety Tried to reaassure pt, declines new tx or counseling,  to f/u any worsening symptoms or concerns  Followup: Return in about 2 weeks (around 09/16/2020), or with PCP.  Oliver Barre, MD 09/02/2020 6:44 PM [facility]

## 2020-09-02 NOTE — Assessment & Plan Note (Signed)
New onset, for hct 12.5 qd

## 2020-09-02 NOTE — Patient Instructions (Signed)
Your BP was repeated today  Please take all new medication as prescribed - the HCT 12.5 mg per day (mild fluid pill)  Please continue all other medications as before, and refills have been done if requested.  Please have the pharmacy call with any other refills you may need.  Please keep your appointments with your specialists as you may have planned - your follow up mammogram films on June 13  Please go to the XRAY Department in the first floor for the x-ray testing  Please go to the LAB at the blood drawing area for the tests to be done  You will be contacted by phone if any changes need to be made immediately.  Otherwise, you will receive a letter about your results with an explanation, but please check with MyChart first.  Please remember to sign up for MyChart if you have not done so, as this will be important to you in the future with finding out test results, communicating by private email, and scheduling acute appointments online when needed.  Please see Dr Alvy Bimler in 2-3 wks to check on swelling and see if other testing is needed

## 2020-09-02 NOTE — Assessment & Plan Note (Signed)
Etiology unclear, for labs and cxr, and f/u with PCP

## 2020-09-03 LAB — HEPATIC FUNCTION PANEL
ALT: 20 U/L (ref 0–35)
AST: 19 U/L (ref 0–37)
Albumin: 4.3 g/dL (ref 3.5–5.2)
Alkaline Phosphatase: 45 U/L (ref 39–117)
Bilirubin, Direct: 0.1 mg/dL (ref 0.0–0.3)
Total Bilirubin: 0.8 mg/dL (ref 0.2–1.2)
Total Protein: 7.1 g/dL (ref 6.0–8.3)

## 2020-09-03 LAB — BASIC METABOLIC PANEL
BUN: 10 mg/dL (ref 6–23)
CO2: 21 mEq/L (ref 19–32)
Calcium: 8.9 mg/dL (ref 8.4–10.5)
Chloride: 108 mEq/L (ref 96–112)
Creatinine, Ser: 0.79 mg/dL (ref 0.40–1.20)
GFR: 89.47 mL/min (ref >60.00)
Glucose, Bld: 87 mg/dL (ref 70–99)
Potassium: 4.2 mEq/L (ref 3.5–5.1)
Sodium: 138 mEq/L (ref 135–145)

## 2020-09-04 ENCOUNTER — Encounter: Payer: Self-pay | Admitting: Internal Medicine

## 2020-09-08 ENCOUNTER — Ambulatory Visit (AMBULATORY_SURGERY_CENTER): Payer: 59 | Admitting: *Deleted

## 2020-09-08 ENCOUNTER — Other Ambulatory Visit: Payer: Self-pay

## 2020-09-08 VITALS — Ht 63.0 in | Wt 208.0 lb

## 2020-09-08 DIAGNOSIS — Z1211 Encounter for screening for malignant neoplasm of colon: Secondary | ICD-10-CM

## 2020-09-08 NOTE — Progress Notes (Signed)

## 2020-09-13 ENCOUNTER — Ambulatory Visit
Admission: RE | Admit: 2020-09-13 | Discharge: 2020-09-13 | Disposition: A | Discharge status: Home / Self Care | Payer: 59 | Source: Ambulatory Visit | Attending: Obstetrics and Gynecology | Admitting: Obstetrics and Gynecology

## 2020-09-13 ENCOUNTER — Other Ambulatory Visit: Payer: Self-pay

## 2020-09-13 DIAGNOSIS — R928 Other abnormal and inconclusive findings on diagnostic imaging of breast: Secondary | ICD-10-CM

## 2020-09-22 ENCOUNTER — Ambulatory Visit (AMBULATORY_SURGERY_CENTER): Payer: 59 | Admitting: Gastroenterology

## 2020-09-22 ENCOUNTER — Encounter: Payer: Self-pay | Admitting: Gastroenterology

## 2020-09-22 ENCOUNTER — Other Ambulatory Visit: Payer: Self-pay

## 2020-09-22 VITALS — BP 135/88 | HR 77 | Temp 97.3°F | Resp 16 | Ht 63.0 in | Wt 208.0 lb

## 2020-09-22 DIAGNOSIS — Z1211 Encounter for screening for malignant neoplasm of colon: Secondary | ICD-10-CM

## 2020-09-22 MED ORDER — SODIUM CHLORIDE 0.9 % IV SOLN: 500.0000 mL | Freq: Once | INTRAVENOUS | Status: DC

## 2020-09-22 NOTE — Progress Notes (Signed)
A/ox3, pleased with MAC, report to RN 

## 2020-09-22 NOTE — Patient Instructions (Signed)
You don't need another colonoscopy for 10 years!  Your colon was normal except for some internal staining.  YOU HAD AN ENDOSCOPIC PROCEDURE TODAY AT THE [facility]:   Refer to the procedure report that was given to you for any specific questions about what was found during the examination.  If the procedure report does not answer your questions, please call your gastroenterologist to clarify.  If you requested that your care partner not be given the details of your procedure findings, then the procedure report has been included in a sealed envelope for you to review at your convenience later.  YOU SHOULD EXPECT: Some feelings of bloating in the abdomen. Passage of more gas than usual.  Walking can help get rid of the air that was put into your GI tract during the procedure and reduce the bloating. If you had a lower endoscopy (such as a colonoscopy or flexible sigmoidoscopy) you may notice spotting of blood in your stool or on the toilet paper. If you underwent a bowel prep for your procedure, you may not have a normal bowel movement for a few days.  Please Note:  You might notice some irritation and congestion in your nose or some drainage.  This is from the oxygen used during your procedure.  There is no need for concern and it should clear up in a day or so.  SYMPTOMS TO REPORT IMMEDIATELY:  Following lower endoscopy (colonoscopy or flexible sigmoidoscopy):  Excessive amounts of blood in the stool  Significant tenderness or worsening of abdominal pains  Swelling of the abdomen that is new, acute  Fever of 100F or higher  For urgent or emergent issues, a gastroenterologist can be reached at any hour by calling [phone]. Do not use MyChart messaging for urgent concerns.    DIET:  We do recommend a small meal at first, but then you may proceed to your regular diet.  Drink plenty of fluids but you should avoid alcoholic beverages for 24 hours.  ACTIVITY:  You should plan to  take it easy for the rest of today and you should NOT DRIVE or use heavy machinery until tomorrow (because of the sedation medicines used during the test).    FOLLOW UP: Our staff will call the number listed on your records 48-72 hours following your procedure to check on you and address any questions or concerns that you may have regarding the information given to you following your procedure. If we do not reach you, we will leave a message.  We will attempt to reach you two times.  During this call, we will ask if you have developed any symptoms of COVID 19. If you develop any symptoms (ie: fever, flu-like symptoms, shortness of breath, cough etc.) before then, please call [phone].  If you test positive for Covid 19 in the 2 weeks post procedure, please call and report this information to [place].    If any biopsies were taken you will be contacted by phone or by letter within the next 1-3 weeks.  Please call us at [phone] if you have not heard about the biopsies in 3 weeks.    SIGNATURES/CONFIDENTIALITY: You and/or your care partner have signed paperwork which will be entered into your electronic medical record.  These signatures attest to the fact that that the information above on your After Visit Summary has been reviewed and is understood.  Full responsibility of the confidentiality of this discharge information lies with you and/or your care-partner.

## 2020-09-22 NOTE — Progress Notes (Signed)
VS-CW  Pt's states no medical or surgical changes since previsit or office visit.  

## 2020-09-22 NOTE — Op Note (Signed)
[facility] Patient Name: Angela Farrell Procedure Date: 09/22/2020 10:56 AM MRN: [id number] Endoscopist: Rachael Fee , MD Age: 45 Referring MD:  Date of Birth: [birth year] Gender: Female Account #: [id number] Procedure:                Colonoscopy Indications:              Screening for colorectal malignant neoplasm Medicines:                Monitored Anesthesia Care Procedure:                Pre-Anesthesia Assessment:                           - Prior to the procedure, a History and Physical                            was performed, and patient medications and                            allergies were reviewed. The patient's tolerance of                            previous anesthesia was also reviewed. The risks                            and benefits of the procedure and the sedation                            options and risks were discussed with the patient.                            All questions were answered, and informed consent                            was obtained. Prior Anticoagulants: The patient has                            taken no previous anticoagulant or antiplatelet                            agents. ASA Grade Assessment: II - A patient with                            mild systemic disease. After reviewing the risks                            and benefits, the patient was deemed in                            satisfactory condition to undergo the procedure.                           After obtaining informed consent, the colonoscope  was passed under direct vision. Throughout the                            procedure, the patient's blood pressure, pulse, and                            oxygen saturations were monitored continuously. The                            Olympus [id number]) [id number] was introduced                            through the anus and advanced to the the cecum,                            identified by  appendiceal orifice and ileocecal                            valve. The colonoscopy was performed without                            difficulty. The patient tolerated the procedure                            well. The quality of the bowel preparation was                            good. The ileocecal valve, appendiceal orifice, and                            rectum were photographed. Scope In: 11:14:39 AM Scope Out: 11:28:28 AM Scope Withdrawal Time: 0 hours 8 minutes 49 seconds  Total Procedure Duration: 0 hours 13 minutes 49 seconds  Findings:                 A diffuse area of melanosis was found in the entire                            colon.                           The exam was otherwise without abnormality on                            direct and retroflexion views. Complications:            No immediate complications. Estimated blood loss:                            None. Estimated Blood Loss:     Estimated blood loss: none. Impression:               - Melanosiss staining in the colon (likely from                            laxatives).                           -  The examination was otherwise normal on direct                            and retroflexion views.                           - No polyps or cancers. Recommendation:           - Patient has a contact number available for                            emergencies. The signs and symptoms of potential                            delayed complications were discussed with the                            patient. Return to normal activities tomorrow.                            Written discharge instructions were provided to the                            patient.                           - Resume previous diet.                           - Continue present medications.                           - Repeat colonoscopy in 10 years for screening. Rachael Fee, MD 09/22/2020 11:32:19 AM This report has been signed electronically.

## 2020-09-24 ENCOUNTER — Telehealth: Payer: Self-pay | Admitting: *Deleted

## 2020-09-24 NOTE — Telephone Encounter (Signed)
  Follow up Call-  Call back number 09/22/2020  Post procedure Call Back phone  # [phone]  Permission to leave phone message Yes  Some recent data might be hidden     Patient questions:  Do you have a fever, pain , or abdominal swelling? No. Pain Score  0 *  Have you tolerated food without any problems? Yes.    Have you been able to return to your normal activities? Yes.    Do you have any questions about your discharge instructions: Diet   No. Medications  No. Follow up visit  No.  Do you have questions or concerns about your Care? No.  Actions: * If pain score is 4 or above: No action needed, pain <4.

## 2020-10-18 ENCOUNTER — Encounter: Payer: Self-pay | Admitting: Internal Medicine

## 2020-10-18 ENCOUNTER — Other Ambulatory Visit: Payer: Self-pay

## 2020-10-18 ENCOUNTER — Ambulatory Visit (INDEPENDENT_AMBULATORY_CARE_PROVIDER_SITE_OTHER)
Admission: RE | Admit: 2020-10-18 | Discharge: 2020-10-18 | Disposition: A | Discharge status: Home / Self Care | Payer: Self-pay | Source: Ambulatory Visit | Attending: Internal Medicine | Admitting: Internal Medicine

## 2020-10-18 DIAGNOSIS — I10 Essential (primary) hypertension: Secondary | ICD-10-CM

## 2020-10-19 ENCOUNTER — Encounter: Payer: Self-pay | Admitting: Internal Medicine

## 2020-10-25 ENCOUNTER — Other Ambulatory Visit: Payer: Self-pay | Admitting: Emergency Medicine

## 2020-10-25 DIAGNOSIS — M62838 Other muscle spasm: Secondary | ICD-10-CM

## 2020-12-30 ENCOUNTER — Ambulatory Visit: Payer: 59 | Admitting: Emergency Medicine

## 2020-12-30 ENCOUNTER — Encounter: Payer: Self-pay | Admitting: Emergency Medicine

## 2020-12-30 ENCOUNTER — Other Ambulatory Visit: Payer: Self-pay

## 2020-12-30 VITALS — BP 132/82 | HR 93 | Temp 98.2°F | Ht 63.0 in | Wt 211.0 lb

## 2020-12-30 DIAGNOSIS — E785 Hyperlipidemia, unspecified: Secondary | ICD-10-CM | POA: Insufficient documentation

## 2020-12-30 DIAGNOSIS — I10 Essential (primary) hypertension: Secondary | ICD-10-CM

## 2020-12-30 MED ORDER — VALSARTAN-HYDROCHLOROTHIAZIDE 80-12.5 MG PO TABS: 1.0000 | ORAL_TABLET | Freq: Every day | ORAL | 3 refills | Status: DC

## 2020-12-30 MED ORDER — ROSUVASTATIN CALCIUM 10 MG PO TABS: 10.0000 mg | ORAL_TABLET | Freq: Every day | ORAL | 3 refills | Status: DC

## 2020-12-30 NOTE — Assessment & Plan Note (Addendum)
Elevated blood pressure readings in the office and at home. BP Readings from Last 3 Encounters:  12/30/20 132/82  09/22/20 135/88  09/02/20 (!) 140/92  Will change hydrochlorothiazide to Diovan-HCTZ 80-12.5 mg daily. Advised to monitor blood pressure readings at home daily for the next several weeks and keep a log.  Dietary approaches to stop hypertension discussed. Follow-up in 6 months.

## 2020-12-30 NOTE — Assessment & Plan Note (Signed)
Diet and nutrition discussed.  Needs to start rosuvastatin 10 mg daily. The 10-year ASCVD risk score (Arnett DK, et al., 2019) is: 6.1%   Values used to calculate the score:     Age: 45 years     Sex: Female     Is Non-Hispanic African American: Yes     Diabetic: No     Tobacco smoker: Yes     Systolic Blood Pressure: 132 mmHg     Is BP treated: Yes     HDL Cholesterol: 59 mg/dL     Total Cholesterol: 226 mg/dL

## 2020-12-30 NOTE — Patient Instructions (Signed)

## 2020-12-30 NOTE — Progress Notes (Signed)
Angela Farrell 45 y.o.   Chief Complaint  Patient presents with   Hypertension    HISTORY OF PRESENT ILLNESS: This is a 45 y.o. female with history of hypertension presently on hydrochlorothiazide 12.5 mg daily here for follow-up. Recent cardiac CT scan had a total score of 0.  No incidental findings in the chest.  Completely normal. Recent lipid profile showed elevated cholesterol. No other complaints or medical concerns today. The 10-year ASCVD risk score (Arnett DK, et al., 2019) is: 6.1%   Values used to calculate the score:     Age: 3 years     Sex: Female     Is Non-Hispanic African American: Yes     Diabetic: No     Tobacco smoker: Yes     Systolic Blood Pressure: 132 mmHg     Is BP treated: Yes     HDL Cholesterol: 59 mg/dL     Total Cholesterol: 226 mg/dL BP Readings from Last 3 Encounters:  12/30/20 132/82  09/22/20 135/88  09/02/20 (!) 140/92     Hypertension Pertinent negatives include no chest pain, headaches, palpitations or shortness of breath.    Prior to Admission medications   Medication Sig Start Date End Date Taking? Authorizing Provider  B Complex-C (SUPER B COMPLEX/VITAMIN C PO) Take by mouth.   Yes [provider]  Calcium-Magnesium-Vitamin D (CALCIUM 1200+D3 PO) Take 1,200 Units by mouth.   Yes [provider]  Cholecalciferol (D3-1000 PO) Take 2,000 Units by mouth.   Yes [provider]  cyclobenzaprine (FLEXERIL) 10 MG tablet TAKE 1 TABLET BY MOUTH EVERYDAY AT BEDTIME 10/25/20  Yes Shantanique Hodo, Eilleen Kempf, MD  etonogestrel (NEXPLANON) 68 MG IMPL implant Nexplanon 68 mg subdermal implant  Removal due 02/2019   Yes [provider]  Galcanezumab-gnlm (EMGALITY) 120 MG/ML SOAJ Inject 1 pen into the skin every 30 (thirty) days. 02/18/20  Yes Butch Penny, NP  hydrochlorothiazide (MICROZIDE) 12.5 MG capsule Take 1 capsule (12.5 mg total) by mouth daily. 09/02/20 09/02/21 Yes Corwin Levins, MD  meloxicam (MOBIC) 15  MG tablet Take 1 tablet by mouth daily. 09/01/20  Yes [provider]  omega-3 acid ethyl esters (LOVAZA) 1 g capsule Take by mouth 2 (two) times daily.   Yes [provider]  rizatriptan (MAXALT-MLT) 10 MG disintegrating tablet Take by mouth. 09/21/20  Yes [provider]  topiramate (TOPAMAX) 100 MG tablet Take 1 tablet (100 mg total) by mouth 2 (two) times daily. 02/18/20  Yes Millikan, Megan, NP  Ubrogepant (UBRELVY) 50 MG TABS Take 50 mg by mouth as needed. May repeat once in two hours, no more than 2 tablets in 24 hours. 08/25/20  Yes Butch Penny, NP  valACYclovir (VALTREX) 500 MG tablet Take 1 tablet (500 mg total) by mouth 2 (two) times daily. 08/16/20  Yes Randall Hiss, MD  vitamin A 8000 UNIT capsule Take 8,000 Units by mouth daily.   Yes [provider]  vitamin C (ASCORBIC ACID) 500 MG tablet Take 500 mg by mouth daily.   Yes [provider]  Coenzyme Q10 (CO Q 10 PO) Take 50 mg by mouth daily. Patient not taking: No sig reported    [provider]  GLUCOSAMINE-CHONDROITIN PO Take 1 tablet by mouth daily. Patient not taking: No sig reported    [provider]  Lutein 40 MG CAPS Take 40 mg by mouth daily. Patient not taking: No sig reported    [provider]  Magnesium 500  MG CAPS Take 500 mg by mouth 4 (four) times daily. Patient not taking: No sig reported    [provider]  Potassium Gluconate 595 MG CAPS Take 595 mg by mouth daily. 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   Pityriasis versicolor 08/12/2018   Migraine 06/20/2018   Depression 11/03/2014   Anxiety 01/18/2012   Chronic anemia 10/14/2011   Chronic headache disorder 04/18/2011   Obese 04/18/2011   Chronic rhinitis 04/18/2011    Class: Chronic    Past Medical History:  Diagnosis Date   Allergy    Anemia    Anxiety    Arthritis    Benign recurrent meningitis (mollaret) 11/18/2018   Chronic headache disorder    s/p Meningitis x 2, tension- type plus allergic/sinus problems   COVID 08/16/2020   COVID-19 05/2020   History of abnormal Pap smear    Meningitis, viral At age 31 and at age 72   Nasal fracture 2011   Other and unspecified ovarian cysts 2011- pelvic CT   GYN- Dr. Chevis Pretty   Vaginal spotting 06/04/2019   Vision abnormalities     Past Surgical History:  Procedure Laterality Date   BACK SURGERY  02/09/15   L5 - S1 diskectomy   FOOT SURGERY  s/p 2 procedures   LAPAROSCOPY FOR ECTOPIC PREGNANCY  1998   Fallopian tube removed   MOUTH SURGERY     spinal injection  03/10/15   SPINE SURGERY     UTERINE ARTERY EMBOLIZATION      Social History   Socioeconomic History   Marital status: Married    Spouse name: [name]   Number of children: Not on file   Years of education: Not on file   Highest education level: Not on file  Occupational History   Occupation: Chief Financial Officer: [employer]  Tobacco Use   Smoking status: Former    Years: 20.00    Types: Cigarettes   Smokeless tobacco: Never  Vaping Use   Vaping Use: Never used  Substance and Sexual Activity   Alcohol use: Yes    Alcohol/week: 1.0 standard drink    Types: 1 Standard drinks or equivalent per week   Drug use: Yes    Types: Marijuana    Comment: per pt "few times a year"   Sexual activity: Yes    Partners: Male    Birth control/protection: Implant    Comment: sexually active w/ female husband  Other Topics Concern   Not on file  Social History Narrative    Drinks about 12-16oz of coffee a day, occasionally drinks a tea.   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    Social Determinants of Health   Financial Resource Strain: Not on file  Food Insecurity: Not on file  Transportation Needs: Not on file  Physical Activity: Not on file  Stress: Not on file  Social Connections: Not on file  Intimate Partner Violence: Not on file    Family History  Problem Relation Age  of Onset   Migraines Father        cluster headache   Hyperlipidemia Father    Hypertension Father    Cancer Mother 83       Breast   Stroke Maternal Grandmother    Diabetes Paternal Grandmother    Diabetes Other        paternal relaitve   Seizures Other        maternal relative   Rheum arthritis Other        maternal relatives   Colon polyps Neg Hx    Esophageal cancer Neg Hx    Stomach cancer Neg Hx    Rectal cancer Neg Hx      Review of Systems  Constitutional: Negative.  Negative for chills and fever.  HENT: Negative.  Negative for congestion and sore throat.   Respiratory: Negative.  Negative for cough and shortness of breath.   Cardiovascular: Negative.  Negative for chest pain and palpitations.  Gastrointestinal: Negative.  Negative for abdominal pain, diarrhea, nausea and vomiting.  Genitourinary: Negative.  Negative for dysuria and hematuria.  Skin: Negative.  Negative for rash.  Neurological:  Negative for dizziness and headaches.  All other systems reviewed and are negative.   Physical Exam Vitals reviewed.  Constitutional:      Appearance: Normal appearance.  HENT:     Head: Normocephalic.  Eyes:     Extraocular Movements: Extraocular movements intact.     Conjunctiva/sclera: Conjunctivae normal.     Pupils: Pupils are equal, round, and reactive to light.  Cardiovascular:     Rate and Rhythm: Normal rate and regular rhythm.     Pulses: Normal pulses.     Heart sounds: Normal heart sounds.  Pulmonary:     Effort: Pulmonary effort is normal.      Breath sounds: Normal breath sounds.  Musculoskeletal:     Cervical back: Normal range of motion and neck supple.     Right lower leg: No edema.     Left lower leg: No edema.  Skin:    General: Skin is warm and dry.     Capillary Refill: Capillary refill takes less than 2 seconds.  Neurological:     General: No focal deficit present.     Mental Status: She is alert and oriented to person, place, and time.  Psychiatric:        Mood and Affect: Mood normal.        Behavior: Behavior normal.     ASSESSMENT & PLAN: Problem List Items Addressed This Visit       Cardiovascular and Mediastinum   Essential hypertension - Primary    Elevated blood pressure readings in the office and at home. BP Readings from Last 3 Encounters:  12/30/20 132/82  09/22/20 135/88  09/02/20 (!) 140/92  Will change hydrochlorothiazide to Diovan-HCTZ 80-12.5 mg daily. Advised to monitor blood pressure readings at home daily for the next several weeks and keep a log.  Dietary approaches to stop hypertension discussed. Follow-up in 6 months.        Relevant Medications   valsartan-hydrochlorothiazide (DIOVAN-HCT) 80-12.5 MG tablet   rosuvastatin (CRESTOR) 10 MG tablet   Other Relevant Orders   Comprehensive metabolic panel   Hemoglobin A1c     Other   Dyslipidemia    Diet and nutrition discussed.  Needs to start rosuvastatin 10 mg daily. The 10-year ASCVD risk score (Arnett DK, et al., 2019) is: 6.1%   Values used to calculate the score:  Age: 74 years     Sex: Female     Is Non-Hispanic African American: Yes     Diabetic: No     Tobacco smoker: Yes     Systolic Blood Pressure: 132 mmHg     Is BP treated: Yes     HDL Cholesterol: 59 mg/dL     Total Cholesterol: 226 mg/dL       Relevant Medications   rosuvastatin (CRESTOR) 10 MG tablet   Other Relevant Orders   Hemoglobin A1c   Lipid panel   Patient Instructions  Hypertension, Adult High blood pressure (hypertension) is when the  force of blood pumping through the arteries is too strong. The arteries are the blood vessels that carry blood from the heart throughout the body. Hypertension forces the heart to work harder to pump blood and may cause arteries to become narrow or stiff. Untreated or uncontrolled hypertension can cause a heart attack, heart failure, a stroke, kidney disease, and other problems. A blood pressure reading consists of a higher number over a lower number. Ideally, your blood pressure should be below 120/80. The first ("top") number is called the systolic pressure. It is a measure of the pressure in your arteries as your heart beats. The second ("bottom") number is called the diastolic pressure. It is a measure of the pressure in your arteries as the heart relaxes. What are the causes? The exact cause of this condition is not known. There are some conditions that result in or are related to high blood pressure. What increases the risk? Some risk factors for high blood pressure are under your control. The following factors may make you more likely to develop this condition: Smoking. Having type 2 diabetes mellitus, high cholesterol, or both. Not getting enough exercise or physical activity. Being overweight. Having too much fat, sugar, calories, or salt (sodium) in your diet. Drinking too much alcohol. Some risk factors for high blood pressure may be difficult or impossible to change. Some of these factors include: Having chronic kidney disease. Having a family history of high blood pressure. Age. Risk increases with age. Race. You may be at higher risk if you are African American. Gender. Men are at higher risk than women before age 57. After age 67, women are at higher risk than men. Having obstructive sleep apnea. Stress. What are the signs or symptoms? High blood pressure may not cause symptoms. Very high blood pressure (hypertensive crisis) may cause: Headache. Anxiety. Shortness of  breath. Nosebleed. Nausea and vomiting. Vision changes. Severe chest pain. Seizures. How is this diagnosed? This condition is diagnosed by measuring your blood pressure while you are seated, with your arm resting on a flat surface, your legs uncrossed, and your feet flat on the floor. The cuff of the blood pressure monitor will be placed directly against the skin of your upper arm at the level of your heart. It should be measured at least twice using the same arm. Certain conditions can cause a difference in blood pressure between your right and left arms. Certain factors can cause blood pressure readings to be lower or higher than normal for a short period of time: When your blood pressure is higher when you are in a health care provider's office than when you are at home, this is called white coat hypertension. Most people with this condition do not need medicines. When your blood pressure is higher at home than when you are in a health care provider's office, this is called masked hypertension.  Most people with this condition may need medicines to control blood pressure. If you have a high blood pressure reading during one visit or you have normal blood pressure with other risk factors, you may be asked to: Return on a different day to have your blood pressure checked again. Monitor your blood pressure at home for 1 week or longer. If you are diagnosed with hypertension, you may have other blood or imaging tests to help your health care provider understand your overall risk for other conditions. How is this treated? This condition is treated by making healthy lifestyle changes, such as eating healthy foods, exercising more, and reducing your alcohol intake. Your health care provider may prescribe medicine if lifestyle changes are not enough to get your blood pressure under control, and if: Your systolic blood pressure is above 130. Your diastolic blood pressure is above 80. Your personal target  blood pressure may vary depending on your medical conditions, your age, and other factors. Follow these instructions at home: Eating and drinking  Eat a diet that is high in fiber and potassium, and low in sodium, added sugar, and fat. An example eating plan is called the DASH (Dietary Approaches to Stop Hypertension) diet. To eat this way: Eat plenty of fresh fruits and vegetables. Try to fill one half of your plate at each meal with fruits and vegetables. Eat whole grains, such as whole-wheat pasta, brown rice, or whole-grain bread. Fill about one fourth of your plate with whole grains. Eat or drink low-fat dairy products, such as skim milk or low-fat yogurt. Avoid fatty cuts of meat, processed or cured meats, and poultry with skin. Fill about one fourth of your plate with lean proteins, such as fish, chicken without skin, beans, eggs, or tofu. Avoid pre-made and processed foods. These tend to be higher in sodium, added sugar, and fat. Reduce your daily sodium intake. Most people with hypertension should eat less than 1,500 mg of sodium a day. Do not drink alcohol if: Your health care provider tells you not to drink. You are pregnant, may be pregnant, or are planning to become pregnant. If you drink alcohol: Limit how much you use to: 0-1 drink a day for women. 0-2 drinks a day for men. Be aware of how much alcohol is in your drink. In the [place] one drink equals one 12 oz bottle of beer (355 mL), one 5 oz glass of wine (148 mL), or one 1 oz glass of hard liquor (44 mL). Lifestyle  Work with your health care provider to maintain a healthy body weight or to lose weight. Ask what an ideal weight is for you. Get at least 30 minutes of exercise most days of the week. Activities may include walking, swimming, or biking. Include exercise to strengthen your muscles (resistance exercise), such as Pilates or lifting weights, as part of your weekly exercise routine. Try to do these types of exercises  for 30 minutes at least 3 days a week. Do not use any products that contain nicotine or tobacco, such as cigarettes, e-cigarettes, and chewing tobacco. If you need help quitting, ask your health care provider. Monitor your blood pressure at home as told by your health care provider. Keep all follow-up visits as told by your health care provider. This is important. Medicines Take over-the-counter and prescription medicines only as told by your health care provider. Follow directions carefully. Blood pressure medicines must be taken as prescribed. Do not skip doses of blood pressure medicine. Doing this puts  you at risk for problems and can make the medicine less effective. Ask your health care provider about side effects or reactions to medicines that you should watch for. Contact a health care provider if you: Think you are having a reaction to a medicine you are taking. Have headaches that keep coming back (recurring). Feel dizzy. Have swelling in your ankles. Have trouble with your vision. Get help right away if you: Develop a severe headache or confusion. Have unusual weakness or numbness. Feel faint. Have severe pain in your chest or abdomen. Vomit repeatedly. Have trouble breathing. Summary Hypertension is when the force of blood pumping through your arteries is too strong. If this condition is not controlled, it may put you at risk for serious complications. Your personal target blood pressure may vary depending on your medical conditions, your age, and other factors. For most people, a normal blood pressure is less than 120/80. Hypertension is treated with lifestyle changes, medicines, or a combination of both. Lifestyle changes include losing weight, eating a healthy, low-sodium diet, exercising more, and limiting alcohol. This information is not intended to replace advice given to you by your health care provider. Make sure you discuss any questions you have with your health care  provider. Document Revised: 11/28/2017 Document Reviewed: 11/28/2017 Elsevier Patient Education  2022 Elsevier Inc.    Edwina Barth, MD [facility]

## 2020-12-31 LAB — LIPID PANEL
Cholesterol: 233 mg/dL — ABNORMAL HIGH (ref 0–200)
HDL: 54.6 mg/dL (ref >39.00)
LDL Cholesterol: 140 mg/dL — ABNORMAL HIGH (ref 0–99)
NonHDL: 178.18
Total CHOL/HDL Ratio: 4
Triglycerides: 189 mg/dL — ABNORMAL HIGH (ref 0.0–149.0)
VLDL: 37.8 mg/dL (ref 0.0–40.0)

## 2020-12-31 LAB — COMPREHENSIVE METABOLIC PANEL
ALT: 32 U/L (ref 0–35)
AST: 27 U/L (ref 0–37)
Albumin: 4.4 g/dL (ref 3.5–5.2)
Alkaline Phosphatase: 58 U/L (ref 39–117)
BUN: 10 mg/dL (ref 6–23)
CO2: 26 mEq/L (ref 19–32)
Calcium: 9.1 mg/dL (ref 8.4–10.5)
Chloride: 104 mEq/L (ref 96–112)
Creatinine, Ser: 0.79 mg/dL (ref 0.40–1.20)
GFR: 89.27 mL/min (ref >60.00)
Glucose, Bld: 91 mg/dL (ref 70–99)
Potassium: 3.9 mEq/L (ref 3.5–5.1)
Sodium: 137 mEq/L (ref 135–145)
Total Bilirubin: 0.5 mg/dL (ref 0.2–1.2)
Total Protein: 7.5 g/dL (ref 6.0–8.3)

## 2020-12-31 LAB — HEMOGLOBIN A1C: Hgb A1c MFr Bld: 5 % (ref 4.6–6.5)

## 2021-01-31 ENCOUNTER — Telehealth: Payer: Self-pay | Admitting: *Deleted

## 2021-01-31 NOTE — Telephone Encounter (Signed)
CMM KEY [id number]. UBRELVY determination pending [facility].

## 2021-02-02 NOTE — Telephone Encounter (Signed)
Received .The request for coverage for UBRELVY TAB 50MG , use as directed (16 per prescription), is denied. This decision is based on health plan criteria for UBRELVY TAB 50MG . This medicine is covered only if: You have a history of therapeutic failure (after at least 3 migraine episodes and a minimum of a 30-day trial), contraindication or intolerance to one of the following (name and date tried required): (A) Almotriptan (Axert). (B) Eletriptan (Relpax). (C) Frovatriptan (Frova). (D) Naratriptan (Amerge). (E) Sumatriptan (Imitrex). (F) Zolmitriptan (Zomig). The information provided does not show that you meet the criteria listed above. *Please note: These products may require prior authorization.

## 2021-02-18 ENCOUNTER — Other Ambulatory Visit: Payer: Self-pay | Admitting: Adult Health

## 2021-02-25 ENCOUNTER — Other Ambulatory Visit: Payer: Self-pay | Admitting: Adult Health

## 2021-03-23 ENCOUNTER — Other Ambulatory Visit: Payer: Self-pay | Admitting: Adult Health

## 2021-06-07 ENCOUNTER — Other Ambulatory Visit: Payer: Self-pay | Admitting: Emergency Medicine

## 2021-06-07 DIAGNOSIS — M62838 Other muscle spasm: Secondary | ICD-10-CM

## 2021-06-30 ENCOUNTER — Ambulatory Visit: Payer: 59 | Admitting: Emergency Medicine

## 2021-06-30 ENCOUNTER — Encounter: Payer: Self-pay | Admitting: Emergency Medicine

## 2021-06-30 VITALS — BP 120/86 | HR 99 | Temp 98.3°F | Ht 63.0 in | Wt 209.1 lb

## 2021-06-30 DIAGNOSIS — E785 Hyperlipidemia, unspecified: Secondary | ICD-10-CM

## 2021-06-30 DIAGNOSIS — S93401S Sprain of unspecified ligament of right ankle, sequela: Secondary | ICD-10-CM

## 2021-06-30 DIAGNOSIS — I10 Essential (primary) hypertension: Secondary | ICD-10-CM | POA: Diagnosis not present

## 2021-06-30 NOTE — Progress Notes (Signed)
Angela Farrell ?46 y.o. ? ? ?Chief Complaint  ?Patient presents with  ? Follow-up  ? Ankle Pain  ?  Ankle swelling, pain, Sprain ankle a few months ago still having issues   ? ? ?HISTORY OF PRESENT ILLNESS: ?This is a 46 y.o. female here for follow-up of hypertension. ?Also sustained bad right ankle sprain last October, slowly recovering, still having pain. ?Under the care of orthopedist.  Had MRI done.  Has done physical therapy.  Stable.  Slowly making progress. ?No other complaints or medical concerns today. ? ?Ankle Pain  ? ? ? ?Prior to Admission medications   ?Medication Sig Start Date End Date Taking? Authorizing Provider  ?B Complex-C (SUPER B COMPLEX/VITAMIN C PO) Take by mouth.   Yes [provider]  ?Calcium-Magnesium-Vitamin D (CALCIUM 1200+D3 PO) Take 1,200 Units by mouth.   Yes [provider]  ?Cholecalciferol (D3-1000 PO) Take 2,000 Units by mouth.   Yes [provider]  ?cyclobenzaprine (FLEXERIL) 10 MG tablet TAKE 1 TABLET BY MOUTH EVERYDAY AT BEDTIME 06/07/21  Yes Garnet Chatmon, Ines Bloomer, MD  ?EMGALITY 120 MG/ML SOAJ INJECT 1 PEN INTO THE SKIN EVERY 30 (THIRTY) DAYS. 02/21/21  Yes Ward Givens, NP  ?etonogestrel (NEXPLANON) 68 MG IMPL implant Nexplanon 68 mg subdermal implant ? Removal due 02/2019   Yes [provider]  ?meloxicam (MOBIC) 15 MG tablet Take 1 tablet by mouth daily. 09/01/20  Yes [provider]  ?omega-3 acid ethyl esters (LOVAZA) 1 g capsule Take by mouth 2 (two) times daily.   Yes [provider]  ?rizatriptan (MAXALT-MLT) 10 MG disintegrating tablet TAKE 1 TABLET BY MOUTH AS NEEDED FOR MIGRAINE. MAY REPEAT IN 2 HOURS IF NEEDED 02/21/21  Yes Ward Givens, NP  ?rosuvastatin (CRESTOR) 10 MG tablet Take 1 tablet (10 mg total) by mouth daily. 12/30/20  Yes Fanchon Papania, Ines Bloomer, MD  ?Ubrogepant (UBRELVY) 50 MG TABS Take 50 mg by mouth as needed. May repeat once in two hours, no more than 2 tablets in 24 hours. 08/25/20  Yes  Ward Givens, NP  ?valACYclovir (VALTREX) 500 MG tablet Take 1 tablet (500 mg total) by mouth 2 (two) times daily. 08/16/20  Yes Truman Hayward, MD  ?vitamin A 8000 UNIT capsule Take 8,000 Units by mouth daily.   Yes [provider]  ?vitamin C (ASCORBIC ACID) 500 MG tablet Take 500 mg by mouth daily.   Yes [provider]  ?Coenzyme Q10 (CO Q 10 PO) Take 50 mg by mouth daily. ?Patient not taking: Reported on 06/30/2021    [provider]  ?GLUCOSAMINE-CHONDROITIN PO Take 1 tablet by mouth daily. ?Patient not taking: Reported on 06/30/2021    [provider]  ?Lutein 40 MG CAPS Take 40 mg by mouth daily. ?Patient not taking: Reported on 06/30/2021    [provider]  ?Magnesium 500 MG CAPS Take 500 mg by mouth 4 (four) times daily. ?Patient not taking: Reported on 06/30/2021    [provider]  ?Potassium Gluconate 595 MG CAPS Take 595 mg by mouth daily. ?Patient not taking: Reported on 06/30/2021    [provider]  ?topiramate (TOPAMAX) 100 MG tablet Take 1 tablet (100 mg total) by mouth 2 (two) times daily. Must be seen for further refills. Call [phone] to schedule. ?Patient not taking: Reported on 06/30/2021 03/01/21   Ward Givens, NP  ?valsartan-hydrochlorothiazide (DIOVAN-HCT) 80-12.5 MG tablet Take 1 tablet by mouth daily. 12/30/20 03/30/21  Horald Pollen, MD  ? ? ?Allergies  ?  Allergen Reactions  ? Adhesive [Tape] Rash  ? Other Rash  ? ? ?Patient Active Problem List  ? Diagnosis Date Noted  ? Dyslipidemia 12/30/2020  ? History of depression 09/02/2020  ? Smoker 09/02/2020  ? Essential hypertension 09/02/2020  ? 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 Pityriasis versicolor 08/12/2018  ? Depression 11/03/2014  ? Anxiety 01/18/2012  ? Chronic anemia 10/14/2011  ? Chronic headache disorder 04/18/2011  ? Obese 04/18/2011  ? Chronic rhinitis 04/18/2011  ?  Class: Chronic  ? ? ?Past Medical History:  ?Diagnosis  Date  ? Allergy   ? Anemia   ? Anxiety   ? Arthritis   ? Benign recurrent meningitis (mollaret) 11/18/2018  ? Chronic headache disorder   ? s/p Meningitis x 2, tension- type plus allergic/sinus problems  ? COVID 08/16/2020  ? COVID-19 05/2020  ? History of abnormal Pap smear   ? Meningitis, viral At age 49 and at age 87  ? Nasal fracture 2011  ? Other and unspecified ovarian cysts 2011- pelvic CT  ? GYN- Dr. Carren Rang  ? Vaginal spotting 06/04/2019  ? Vision abnormalities   ? ? ?Past Surgical History:  ?Procedure Laterality Date  ? BACK SURGERY  02/09/15  ? L5 - S1 diskectomy  ? FOOT SURGERY  s/p 2 procedures  ? LAPAROSCOPY FOR ECTOPIC PREGNANCY  1998  ? Fallopian tube removed  ? MOUTH SURGERY    ? spinal injection  03/10/15  ? SPINE SURGERY    ? UTERINE ARTERY EMBOLIZATION    ? ? ?Social History  ? ?Socioeconomic History  ? Marital status: Married  ?  Spouse name: Linna Hoff  ? Number of children: Not on file  ? Years of education: Not on file  ? Highest education level: Not on file  ?Occupational History  ? Occupation: Environmental consultant  ?  Employer: [employer]  ?Tobacco Use  ? Smoking status: Former  ?  Years: 20.00  ?  Types: Cigarettes  ? Smokeless tobacco: Never  ?Vaping Use  ? Vaping Use: Never used  ?Substance and Sexual Activity  ? Alcohol use: Yes  ?  Alcohol/week: 1.0 standard drink  ?  Types: 1 Standard drinks or equivalent per week  ? Drug use: Yes  ?  Types: Marijuana  ?  Comment: per pt "few times a year"  ? Sexual activity: Yes  ?  Partners: Male  ?  Birth control/protection: Implant  ?  Comment: sexually active w/ female husband  ?Other Topics Concern  ? Not on file  ?Social History Narrative  ? Drinks about 12-16oz of coffee a day, occasionally drinks a tea.   Woks on computer as Administrator.   ? ?Social Determinants of Health  ? ?Financial Resource Strain: Not on file  ?Food Insecurity: Not on file  ?Transportation Needs: Not on file  ?Physical Activity: Not on file  ?Stress: Not on file  ?Social Connections: Not on  file  ?Intimate Partner Violence: Not on file  ? ? ?Family History  ?Problem Relation Age of Onset  ? Migraines Father   ?     cluster headache  ? Hyperlipidemia Father   ? Hypertension Father   ? Cancer Mother 31  ?     Breast  ? Stroke Maternal Grandmother   ? Diabetes Paternal Grandmother   ? Diabetes Other   ?     paternal relaitve  ? Seizures Other   ?     maternal relative  ? Rheum arthritis  Other   ?     maternal relatives  ? Colon polyps Neg Hx   ? Esophageal cancer Neg Hx   ? Stomach cancer Neg Hx   ? Rectal cancer Neg Hx   ? ? ? ?Review of Systems  ?Constitutional: Negative.  Negative for fever.  ?HENT: Negative.  Negative for congestion and sore throat.   ?Respiratory: Negative.  Negative for cough and shortness of breath.   ?Cardiovascular: Negative.  Negative for chest pain and palpitations.  ?Gastrointestinal:  Negative for abdominal pain, diarrhea, nausea and vomiting.  ?Genitourinary: Negative.   ?Skin: Negative.  Negative for rash.  ?Neurological:  Negative for dizziness and headaches.  ?All other systems reviewed and are negative. ? ?Today's Vitals  ? 06/30/21 1609  ?Weight: 209 lb 2 oz (94.9 kg)  ?Height: 5\' 3"  (1.6 m)  ? ?Body mass index is 37.04 kg/m?. ?Wt Readings from Last 3 Encounters:  ?06/30/21 209 lb 2 oz (94.9 kg)  ?12/30/20 211 lb (95.7 kg)  ?09/22/20 208 lb (94.3 kg)  ? ? ?Physical Exam ?Vitals reviewed.  ?Constitutional:   ?   Appearance: Normal appearance.  ?HENT:  ?   Head: Normocephalic.  ?Eyes:  ?   Extraocular Movements: Extraocular movements intact.  ?   Pupils: Pupils are equal, round, and reactive to light.  ?Cardiovascular:  ?   Rate and Rhythm: Normal rate and regular rhythm.  ?   Pulses: Normal pulses.  ?   Heart sounds: Normal heart sounds.  ?Pulmonary:  ?   Effort: Pulmonary effort is normal.  ?   Breath sounds: Normal breath sounds.  ?Musculoskeletal:     ?   General: Normal range of motion.  ?   Cervical back: No tenderness.  ?   Comments: Right ankle: No erythema or  swelling.  Full range of motion.  No significant tenderness  ?Lymphadenopathy:  ?   Cervical: No cervical adenopathy.  ?Skin: ?   General: Skin is warm and dry.  ?   Capillary Refill: Capillary refill takes l

## 2021-06-30 NOTE — Patient Instructions (Signed)

## 2021-06-30 NOTE — Assessment & Plan Note (Signed)
Stable.  Continue rosuvastatin 10 mg daily. ?The 10-year ASCVD risk score (Arnett DK, et al., 2019) is: 1.2% ?  Values used to calculate the score: ?    Age: 46 years ?    Sex: Female ?    Is Non-Hispanic African American: Yes ?    Diabetic: No ?    Tobacco smoker: No ?    Systolic Blood Pressure: 120 mmHg ?    Is BP treated: No ?    HDL Cholesterol: 54.6 mg/dL ?    Total Cholesterol: 233 mg/dL ? ?

## 2021-06-30 NOTE — Assessment & Plan Note (Signed)
Well-controlled hypertension.  Continue Diovan HCTZ 80-12.5 mg daily. ?Dietary approaches to stop hypertension discussed. ?BP Readings from Last 3 Encounters:  ?06/30/21 120/86  ?12/30/20 132/82  ?09/22/20 135/88  ? ? ?

## 2021-07-18 ENCOUNTER — Other Ambulatory Visit: Payer: Self-pay | Admitting: Infectious Disease

## 2021-07-20 ENCOUNTER — Other Ambulatory Visit: Payer: Self-pay | Admitting: Adult Health

## 2021-08-11 ENCOUNTER — Other Ambulatory Visit: Payer: Self-pay | Admitting: Infectious Disease

## 2021-08-11 NOTE — Telephone Encounter (Signed)
Appt 5.15

## 2021-08-15 ENCOUNTER — Ambulatory Visit: Payer: 59 | Admitting: Infectious Disease

## 2021-08-15 ENCOUNTER — Other Ambulatory Visit: Payer: Self-pay

## 2021-08-15 ENCOUNTER — Encounter: Payer: Self-pay | Admitting: Infectious Disease

## 2021-08-15 VITALS — BP 145/93 | HR 106 | Resp 16 | Ht 63.0 in | Wt 209.0 lb

## 2021-08-15 DIAGNOSIS — R519 Headache, unspecified: Secondary | ICD-10-CM | POA: Diagnosis not present

## 2021-08-15 DIAGNOSIS — G032 Benign recurrent meningitis [Mollaret]: Secondary | ICD-10-CM

## 2021-08-15 DIAGNOSIS — B003 Herpesviral meningitis: Secondary | ICD-10-CM

## 2021-08-15 DIAGNOSIS — G8929 Other chronic pain: Secondary | ICD-10-CM | POA: Diagnosis not present

## 2021-08-15 MED ORDER — VALACYCLOVIR HCL 500 MG PO TABS: 500.0000 mg | ORAL_TABLET | Freq: Two times a day (BID) | ORAL | 11 refills | Status: DC

## 2021-08-15 NOTE — Progress Notes (Signed)
? ?Subjective:  ?Chief complaint follow-up for molar is meningitis ? [name], female    DOB: 04/22/1975, 47 y.o.   MRN: [id number] ? ?HPI ? ?80 year old African-American woman whom I met in 2010.  At that time she had carried a diagnosis of "aseptic meningitis and had undergone a lumbar puncture previously it was a long hospital.  It had not grown any organisms but did not sent been sent for PCR testing.  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 ?  ?Indeed her HSV-2 PCR was positive in 2010 as it was again in 2013 on a different hospital admission. ?  ?She has been on Valtrex at times to prevent HSV 2 meningitis.  It is interesting because having prophylactic Valtrex to patients with genital herpes does reduce the frequency of genital outbreaks but in setting of HSV-2 meningitis the data is not clear that it helps and there is even some data to suggest it may increase the frequency of recurrent meningitis. ?  ?However in the case of Kezia she has felt very strongly that when she is not taking her Valtrex which she takes at a dose of 500 twice daily it is very soon after that she has an recurrence of HSV-2 meningitis. ?  ?Does have comorbid migraine headaches but she can sometimes tell the difference in particular once she starts having neck stiffness and she also at times will have fevers. ?  ?Veroncia says that since she has been on Valtrex 500 twice daily reliably she has not had any further episodes of her meningitis type headaches that she does have her other chronic headaches.  She has been out of multiple medications for them recently but has follow-up with neurology. ? ? ? ?Past Medical History:  ?Diagnosis Date  ? Allergy   ? Anemia   ? Anxiety   ? Arthritis   ? Benign recurrent meningitis (mollaret) 11/18/2018  ? Chronic headache disorder   ? s/p Meningitis x 2, tension- type plus allergic/sinus  problems  ? COVID 08/16/2020  ? COVID-19 05/2020  ? History of abnormal Pap smear   ? Meningitis, viral At age 70 and at age 69  ? Nasal fracture 2011  ? Other and unspecified ovarian cysts 2011- pelvic CT  ? GYN- Dr. Carren Rang  ? Vaginal spotting 06/04/2019  ? Vision abnormalities   ? ? ?Past Surgical History:  ?Procedure Laterality Date  ? BACK SURGERY  02/09/15  ? L5 - S1 diskectomy  ? FOOT SURGERY  s/p 2 procedures  ? LAPAROSCOPY FOR ECTOPIC PREGNANCY  1998  ? Fallopian tube removed  ? MOUTH SURGERY    ? spinal injection  03/10/15  ? SPINE SURGERY    ? UTERINE ARTERY EMBOLIZATION    ? ? ?Family History  ?Problem Relation Age of Onset  ? Migraines Father   ?     cluster headache  ? Hyperlipidemia Father   ? Hypertension Father   ? Cancer Mother 24  ?     Breast  ? Stroke Maternal Grandmother   ? Diabetes Paternal Grandmother   ? Diabetes Other   ?     paternal relaitve  ? Seizures Other   ?     maternal relative  ? Rheum arthritis Other   ?     maternal relatives  ? Colon polyps Neg Hx   ? Esophageal cancer Neg Hx   ?  Stomach cancer Neg Hx   ? Rectal cancer Neg Hx   ? ? ?  ?Social History  ? ?Socioeconomic History  ? Marital status: Married  ?  Spouse name: Linna Hoff  ? Number of children: Not on file  ? Years of education: Not on file  ? Highest education level: Not on file  ?Occupational History  ? Occupation: Environmental consultant  ?  Employer: [employer]  ?Tobacco Use  ? Smoking status: Former  ?  Years: 20.00  ?  Types: Cigarettes  ? Smokeless tobacco: Never  ?Vaping Use  ? Vaping Use: Never used  ?Substance and Sexual Activity  ? Alcohol use: Yes  ?  Alcohol/week: 1.0 standard drink  ?  Types: 1 Standard drinks or equivalent per week  ? Drug use: Yes  ?  Types: Marijuana  ?  Comment: per pt "few times a year"  ? Sexual activity: Yes  ?  Partners: Male  ?  Birth control/protection: Implant  ?  Comment: sexually active w/ female husband  ?Other Topics Concern  ? Not on file  ?Social History Narrative  ? Drinks about 12-16oz of  coffee a day, occasionally drinks a tea.   [employer]oks on computer as Administrator.   ? ?Social Determinants of Health  ? ?Financial Resource Strain: Not on file  ?Food Insecurity: Not on file  ?Transportation Needs: Not on file  ?Physical Activity: Not on file  ?Stress: Not on file  ?Social Connections: Not on file  ? ? ?Allergies  ?Allergen Reactions  ? Adhesive [Tape] Rash  ? Other Rash  ? ? ? ?Current Outpatient Medications:  ?  B Complex-C (SUPER B COMPLEX/VITAMIN C PO), Take by mouth., Disp: , Rfl:  ?  Cholecalciferol (D3-1000 PO), Take 2,000 Units by mouth., Disp: , Rfl:  ?  cyclobenzaprine (FLEXERIL) 10 MG tablet, TAKE 1 TABLET BY MOUTH EVERYDAY AT BEDTIME, Disp: 30 tablet, Rfl: 1 ?  etonogestrel (NEXPLANON) 68 MG IMPL implant, Nexplanon 68 mg subdermal implant  Removal due 02/2019, Disp: , Rfl:  ?  Magnesium 500 MG CAPS, Take 500 mg by mouth 4 (four) times daily., Disp: , Rfl:  ?  meloxicam (MOBIC) 15 MG tablet, Take 1 tablet by mouth daily., Disp: , Rfl:  ?  omega-3 acid ethyl esters (LOVAZA) 1 g capsule, Take by mouth 2 (two) times daily., Disp: , Rfl:  ?  Potassium Gluconate 595 MG CAPS, Take 595 mg by mouth daily., Disp: , Rfl:  ?  rizatriptan (MAXALT-MLT) 10 MG disintegrating tablet, TAKE 1 TABLET BY MOUTH AS NEEDED FOR MIGRAINE. MAY REPEAT IN 2 HOURS IF NEEDED, Disp: 4 tablet, Rfl: 13 ?  rosuvastatin (CRESTOR) 10 MG tablet, Take 1 tablet (10 mg total) by mouth daily., Disp: 90 tablet, Rfl: 3 ?  vitamin A 8000 UNIT capsule, Take 8,000 Units by mouth daily., Disp: , Rfl:  ?  vitamin C (ASCORBIC ACID) 500 MG tablet, Take 500 mg by mouth daily., Disp: , Rfl:  ?  valACYclovir (VALTREX) 500 MG tablet, Take 1 tablet (500 mg total) by mouth 2 (two) times daily., Disp: 60 tablet, Rfl: 11 ?  valsartan-hydrochlorothiazide (DIOVAN-HCT) 80-12.5 MG tablet, Take 1 tablet by mouth daily., Disp: 90 tablet, Rfl: 3 ? ? ? ?Review of Systems  ?Constitutional:  Negative for activity change, appetite change, chills, diaphoresis,  fatigue, fever and unexpected weight change.  ?HENT:  Negative for congestion, rhinorrhea, sinus pressure, sneezing, sore throat and trouble swallowing.   ?Eyes:  Negative for photophobia and  visual disturbance.  ?Respiratory:  Negative for cough, chest tightness, shortness of breath, wheezing and stridor.   ?Cardiovascular:  Negative for chest pain, palpitations and leg swelling.  ?Gastrointestinal:  Negative for abdominal distention, abdominal pain, anal bleeding, blood in stool, constipation, diarrhea, nausea and vomiting.  ?Genitourinary:  Negative for difficulty urinating, dysuria, flank pain and hematuria.  ?Musculoskeletal:  Negative for arthralgias, back pain, gait problem, joint swelling and myalgias.  ?Skin:  Negative for color change, pallor, rash and wound.  ?Neurological:  Positive for headaches. Negative for dizziness, tremors, weakness and light-headedness.  ?Hematological:  Negative for adenopathy. Does not bruise/bleed easily.  ?Psychiatric/Behavioral:  Negative for agitation, behavioral problems, confusion, decreased concentration, dysphoric mood and sleep disturbance.   ? ?   ?Objective:  ? Physical Exam ?Constitutional:   ?   General: She is not in acute distress. ?   Appearance: Normal appearance. She is well-developed. She is not ill-appearing or diaphoretic.  ?HENT:  ?   Head: Normocephalic and atraumatic.  ?   Right Ear: Hearing and external ear normal.  ?   Left Ear: Hearing and external ear normal.  ?   Nose: No nasal deformity or rhinorrhea.  ?Eyes:  ?   General: No scleral icterus. ?   Conjunctiva/sclera: Conjunctivae normal.  ?   Right eye: Right conjunctiva is not injected.  ?   Left eye: Left conjunctiva is not injected.  ?   Pupils: Pupils are equal, round, and reactive to light.  ?Neck:  ?   Vascular: No JVD.  ?Cardiovascular:  ?   Rate and Rhythm: Normal rate and regular rhythm.  ?   Heart sounds: Normal heart sounds, S1 normal and S2 normal.  ?Pulmonary:  ?   Effort: Pulmonary  effort is normal. No respiratory distress.  ?Abdominal:  ?   General: There is no distension.  ?   Palpations: Abdomen is soft.  ?Musculoskeletal:     ?   General: Normal range of motion.  ?   Right shoulder: N

## 2021-11-15 ENCOUNTER — Telehealth: Payer: Self-pay | Admitting: *Deleted

## 2021-11-15 ENCOUNTER — Encounter: Payer: Self-pay | Admitting: Diagnostic Neuroimaging

## 2021-11-15 ENCOUNTER — Ambulatory Visit: Payer: 59 | Admitting: Neurology

## 2021-11-15 ENCOUNTER — Ambulatory Visit: Payer: 59 | Admitting: Diagnostic Neuroimaging

## 2021-11-15 VITALS — BP 135/89 | HR 87 | Ht 63.0 in | Wt 211.2 lb

## 2021-11-15 DIAGNOSIS — G43009 Migraine without aura, not intractable, without status migrainosus: Secondary | ICD-10-CM | POA: Diagnosis not present

## 2021-11-15 MED ORDER — RIZATRIPTAN BENZOATE 10 MG PO TBDP: 10.0000 mg | ORAL_TABLET | ORAL | 12 refills | Status: DC | PRN

## 2021-11-15 MED ORDER — NURTEC 75 MG PO TBDP: 75.0000 mg | ORAL_TABLET | ORAL | 12 refills | Status: DC

## 2021-11-15 NOTE — Telephone Encounter (Signed)
Received e mail, documents attached. Your information has been sent to [facility].  

## 2021-11-15 NOTE — Telephone Encounter (Signed)
[employer] PA, Key: [facility] , faxed office notes to be attached.

## 2021-11-15 NOTE — Progress Notes (Signed)
[facility]  PATIENT: Angela Farrell DOB: [birth year]  REFERRING CLINICIAN: Georgina Quint, *  HISTORY FROM: patient REASON FOR VISIT: follow up   HISTORICAL  CHIEF COMPLAINT:  Chief Complaint  Patient presents with   Migraine    Rm 7, est pt last seen 02/2020 "average 3 or more a month; weaned off Emgality and topamax, rizatriptan helps but headache still lingers some"    HISTORY OF PRESENT ILLNESS:   UPDATE (11/15/21, VRP): Here for migraine follow up. Avg ~5+ migraines per month. Not on rizatriptan (refills ran out; previously only getting 4 tabs per month. ? Insurance reason?).   UPDATE (12/10/17, VRP): Since last visit, doing about the same. Symptoms are moderate. Still with daily headaches + 2 severe migraines per month. No alleviating or aggravating factors. Tolerating meds.    UPDATE 10/23/16: Since last visit, was doing well until last few months. Now daily headaches, and severe migraine 1 per week. [place] 4th week, and having more migraines.   UPDATE 04/25/16: Since last visit, doing well. Only 2-3 migraine in the last 6-7 months. Much improved.  Last migraine was a few weeks ago. Has been doing better with yoga and exercise.  UPDATE 09/21/15: Since last visit, overall stable. Avg 1-2 migraine per month. Avg 3-4 HA per week. Currently with right forehead headache.   UPDATE 06/21/15: Since last visit, was doing well with higher TPX, but now more work stress, and more HA since March 2017.  UPDATE 03/23/15: Since last visit, was doing better with TPX + rizatriptan; then had back surgery Feb 09, 2015; then Dec 2016 having more HA.  PRIOR HPI (12/25/14): 46 year old ambidextrous female here for evaluation of headaches. 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 Patient has history of headaches starting in childhood around 46 years old. She describes frontal, eye pressure,  throbbing severe headaches with neck pain, photophobia, phonophobia and occasionally nausea. Headaches can last hours to days at a time. Triggers include stress. Over the years headaches varied in frequency and severity. More recently headaches have been occurring roughly 4-6 days per month. Patient has family history of cluster headache in her father. Patient herself has never been diagnosed with migraine headaches or tried migraine medications. Patient had been hospitalized several times for headache, light sensitivity, neck pain, fevers, with testing and lumbar puncture results demonstrating viral/aseptic meningitis. 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 On the basis of this patient was diagnosed with recurrent meningitis (Mollaret's) in 2013. Since that time she has been prescribed valacyclovir 500 mg twice a day to be taken during times of recurrence of symptoms. She uses this 6-8 months out of the year, 3-4 days at a time. This has helped keep some of her symptoms under control. Her last hospitalization was in 2013 for meningitis. More recently, last night patient had recurrence of headache. She had some nausea and vomiting last night. This morning and during office visit patient is suffering with severe headache, sensitivity to light, neck pain. Patient states that her current symptoms feel slightly similar to her prior meningitis attacks but not as severe. Patient has not started valacyclovir yet for this attack.   REVIEW OF SYSTEMS: Full 14 system review of systems performed and negative except for: anemia runny nose neck pain freq waking.   ALLERGIES: Allergies  Allergen Reactions   Adhesive [Tape] Rash   Other Rash  HOME MEDICATIONS: Outpatient Medications Prior to Visit  Medication Sig Dispense Refill   B Complex-C (SUPER B COMPLEX/VITAMIN C PO) Take by mouth.     Cholecalciferol (D3-1000 PO) Take 2,000 Units by mouth.     cyclobenzaprine (FLEXERIL) 10 MG tablet TAKE 1 TABLET BY  MOUTH EVERYDAY AT BEDTIME 30 tablet 1   etonogestrel (NEXPLANON) 68 MG IMPL implant Nexplanon 68 mg subdermal implant  Removal due 02/2019     Magnesium 500 MG CAPS Take 500 mg by mouth 4 (four) times daily.     meloxicam (MOBIC) 15 MG tablet Take 1 tablet by mouth daily.     omega-3 acid ethyl esters (LOVAZA) 1 g capsule Take by mouth 2 (two) times daily.     Potassium Gluconate 595 MG CAPS Take 595 mg by mouth daily.     rosuvastatin (CRESTOR) 10 MG tablet Take 1 tablet (10 mg total) by mouth daily. 90 tablet 3   valACYclovir (VALTREX) 500 MG tablet Take 1 tablet (500 mg total) by mouth 2 (two) times daily. 60 tablet 11   vitamin A 8000 UNIT capsule Take 8,000 Units by mouth daily.     vitamin C (ASCORBIC ACID) 500 MG tablet Take 500 mg by mouth daily.     rizatriptan (MAXALT-MLT) 10 MG disintegrating tablet TAKE 1 TABLET BY MOUTH AS NEEDED FOR MIGRAINE. MAY REPEAT IN 2 HOURS IF NEEDED 4 tablet 13   valsartan-hydrochlorothiazide (DIOVAN-HCT) 80-12.5 MG tablet Take 1 tablet by mouth daily. 90 tablet 3   No facility-administered medications prior to visit.    PAST MEDICAL HISTORY: Past Medical History:  Diagnosis Date   Allergy    Anemia    Anxiety    Arthritis    Benign recurrent meningitis (mollaret) 11/18/2018   Chronic headache disorder    s/p Meningitis x 2, tension- type plus allergic/sinus problems   COVID 08/16/2020   COVID-19 05/2020   History of abnormal Pap smear    Meningitis, viral At age 56 and at age 50   Nasal fracture 2011   Other and unspecified ovarian cysts 2011- pelvic CT   GYN- Dr. Chevis Pretty   Vaginal spotting 06/04/2019   Vision abnormalities     PAST SURGICAL HISTORY: Past Surgical History:  Procedure Laterality Date   BACK SURGERY  02/09/15   L5 - S1 diskectomy   FOOT SURGERY  s/p 2 procedures   LAPAROSCOPY FOR ECTOPIC PREGNANCY  1998   Fallopian tube removed   MOUTH SURGERY     spinal injection  03/10/15   SPINE SURGERY     UTERINE ARTERY EMBOLIZATION       FAMILY HISTORY: Family History  Problem Relation Age of Onset   Migraines Father        cluster headache   Hyperlipidemia Father    Hypertension Father    Cancer Mother 72       Breast   Stroke Maternal Grandmother    Diabetes Paternal Grandmother    Diabetes Other        paternal relaitve   Seizures Other        maternal relative   Rheum arthritis Other        maternal relatives   Colon polyps Neg Hx    Esophageal cancer Neg Hx    Stomach cancer Neg Hx    Rectal cancer Neg Hx     SOCIAL HISTORY:  Social History   Socioeconomic History   Marital status: Married    Spouse name: Jesusita Oka  Number of children: Not on file   Years of education: Not on file   Highest education level: Not on file  Occupational History   Occupation: Chief Financial Officer: [employer]  Tobacco Use   Smoking status: Some Days    Years: 20.00    Types: Cigarettes   Smokeless tobacco: Never  Vaping Use   Vaping Use: Never used  Substance and Sexual Activity   Alcohol use: Yes    Alcohol/week: 1.0 standard drink of alcohol    Types: 1 Standard drinks or equivalent per week   Drug use: Yes    Types: Marijuana    Comment: per pt "few times a year"   Sexual activity: Yes    Partners: Male    Birth control/protection: Implant    Comment: sexually active w/ female husband  Other Topics Concern   Not on file  Social History Narrative   Drinks about 12-16oz of coffee a day, occasionally drinks a tea.   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    Social Determinants of Health   Financial Resource Strain: Not on file  Food Insecurity: Not on file  Transportation Needs: Not on file  Physical Activity: Unknown (02/17/2018)   Exercise Vital Sign    Days of Exercise per Week: 0 days    Minutes of Exercise per Session: Not on file  Stress: Not on file  Social Connections: Not on file  Intimate Partner Violence: Not on file     PHYSICAL EXAM  GENERAL EXAM/CONSTITUTIONAL: Vitals:   Vitals:   11/15/21 1125  BP: 135/89  Pulse: 87  Weight: 211 lb 3.2 oz (95.8 kg)  Height: 5\' 3"  (1.6 m)   Body mass index is 37.41 kg/m. Wt Readings from Last 3 Encounters:  11/15/21 211 lb 3.2 oz (95.8 kg)  08/15/21 209 lb (94.8 kg)  06/30/21 209 lb 2 oz (94.9 kg)   Patient is in no distress; well developed, nourished and groomed; neck is supple  CARDIOVASCULAR: Examination of carotid arteries is normal; no carotid bruits Regular rate and rhythm, no murmurs Examination of peripheral vascular system by observation and palpation is normal  EYES: Ophthalmoscopic exam of optic discs and posterior segments is normal; no papilledema or hemorrhages No results found.  MUSCULOSKELETAL: Gait, strength, tone, movements noted in Neurologic exam below  NEUROLOGIC: MENTAL STATUS:      No data to display         awake, alert, oriented to person, place and time recent and remote memory intact normal attention and concentration language fluent, comprehension intact, naming intact fund of knowledge appropriate  CRANIAL NERVE:  2nd - no papilledema on fundoscopic exam 2nd, 3rd, 4th, 6th - pupils equal and reactive to light, visual fields full to confrontation, extraocular muscles intact, no nystagmus 5th - facial sensation symmetric 7th - facial strength symmetric 8th - hearing intact 9th - palate elevates symmetrically, uvula midline 11th - shoulder shrug symmetric 12th - tongue protrusion midline  MOTOR:  normal bulk and tone, full strength in the BUE, BLE  SENSORY:  normal and symmetric to light touch, temperature, vibration  COORDINATION:  finger-nose-finger, fine finger movements normal  REFLEXES:  deep tendon reflexes present and symmetric  GAIT/STATION:  narrow based gait     DIAGNOSTIC DATA (LABS, IMAGING, TESTING) - I reviewed patient records, labs, notes, testing and imaging myself where available.  Lab Results  Component Value Date   WBC 6.7  09/02/2020   HGB 11.8 (L) 09/02/2020  HCT 35.9 (L) 09/02/2020   MCV 89.3 09/02/2020   PLT 281.0 09/02/2020      Component Value Date/Time   NA 137 12/30/2020 1616   NA 138 04/29/2020 1007   K 3.9 12/30/2020 1616   CL 104 12/30/2020 1616   CO2 26 12/30/2020 1616   GLUCOSE 91 12/30/2020 1616   BUN 10 12/30/2020 1616   BUN 7 04/29/2020 1007   CREATININE 0.79 12/30/2020 1616   CREATININE 0.78 12/21/2016 0855   CALCIUM 9.1 12/30/2020 1616   PROT 7.5 12/30/2020 1616   PROT 7.3 04/29/2020 1007   ALBUMIN 4.4 12/30/2020 1616   ALBUMIN 4.6 04/29/2020 1007   AST 27 12/30/2020 1616   ALT 32 12/30/2020 1616   ALKPHOS 58 12/30/2020 1616   BILITOT 0.5 12/30/2020 1616   BILITOT 0.6 04/29/2020 1007   GFRNONAA 84 04/29/2020 1007   GFRNONAA >89 10/14/2014 0944   GFRAA 96 04/29/2020 1007   GFRAA >89 10/14/2014 0944   Lab Results  Component Value Date   CHOL 233 (H) 12/30/2020   HDL 54.60 12/30/2020   LDLCALC 140 (H) 12/30/2020   TRIG 189.0 (H) 12/30/2020   CHOLHDL 4 12/30/2020   Lab Results  Component Value Date   HGBA1C 5.0 12/30/2020   No results found for: "VITAMINB12" Lab Results  Component Value Date   TSH 0.847 02/15/2018    10/22/14 MRI thoracic spine [I reviewed images myself and agree with interpretation. -VRP]  - Multilevel mild disc degeneration without definitive impingement. - Multilevel mild to moderate left foraminal stenoses are noted above.   11/27/14 MRI lumbar spine [I reviewed images myself and agree with interpretation. -VRP]  - Disc bulging and facet hypertrophy L4-5 with mild to moderate spinal stenosis. - Large central disc protrusion L5-S1 with possible extruded disc fragments. There is severe spinal stenosis with compression of the thecal sac and S1 nerve roots bilaterally.    09/26/14 CT head [I reviewed images myself and agree with interpretation. -VRP]  - Stable and normal noncontrast CT appearance of the brain.  01/20/15 MRI brain  -  normal  03/03/15 PSG / split - normal AHI - sleep fragmentation    ASSESSMENT AND PLAN  46 y.o. year old female here with history of recurrent aseptic/viral meningitis since 2008-2013, also with history of migraine type headaches since age 53 years old, never officially diagnosed. Also with recurrent migraine attacks.   Dx:    Migraine without aura and without status migrainosus, not intractable    PLAN:  MIGRAINE WITHOUT AURA (5-8 migraines per month) + CHRONIC DAILY HEADACHES (not controlled) - nurtec 75mg  every other day (migraine prevention; failed topiramate, propranolol, aimovig, emgality, ubrelvy) - rizatriptan as needed for breakthrough migraine  Meds ordered this encounter  Medications   Rimegepant Sulfate (NURTEC) 75 MG TBDP    Sig: Take 75 mg by mouth every other day.    Dispense:  15 tablet    Refill:  12    Migraine prevention dosing   rizatriptan (MAXALT-MLT) 10 MG disintegrating tablet    Sig: Take 1 tablet (10 mg total) by mouth as needed for migraine (may repeat x 1 tab after 2 hours; max 2 tabs per day or 8 tabs per month). May repeat in 2 hours if needed    Dispense:  8 tablet    Refill:  12   Return in about 4 months (around 03/17/2022) for with NP 03/19/2022).    Butch Penny, MD 11/15/2021, 12:21 PM Certified in Neurology,  Neurophysiology and Neuroimaging  [facility] [address] [place], [place] [address] [phone]

## 2021-11-15 NOTE — Patient Instructions (Addendum)
MIGRAINE WITHOUT AURA + CHRONIC DAILY HEADACHES (not controlled)  - nurtec 75mg  every other day (for prevention)  - rizatriptan as needed for breakthrough migraine

## 2021-11-17 ENCOUNTER — Encounter: Payer: Self-pay | Admitting: *Deleted

## 2021-11-17 ENCOUNTER — Encounter: Payer: Self-pay | Admitting: Diagnostic Neuroimaging

## 2021-11-17 NOTE — Telephone Encounter (Signed)
Failed: sumatriptan Intolerance: topiramate, propranolol Diagnosis: episodic migraine > 4 days per month  Please submit above info.  Suanne Marker, MD 11/17/2021, 9:11 AM Certified in Neurology, Neurophysiology and Neuroimaging  [facility] [address], [address] [place], [place] [address] [phone]

## 2021-11-17 NOTE — Telephone Encounter (Signed)
Nurtec denied: This decision is based on health plan criteria for NURTEC TAB 75MG  ODT. This medicine is covered only if: One of the following: (1) All of the following: (A) You have a history of therapeutic failure (after at least 3 migraine episodes and a minimum of a 30-day trial), contraindication or intolerance to one of the following (document name and date tried): (I) Almotriptan (Axert). (II) Eletriptan (Relpax). (III) Frovatriptan (Frova). (IV) Naratriptan (Amerge). (V) Sumatriptan (Imitrex). (VI) Zolmitriptan (Zomig/Zomig-ZMT). (C) One of the following: (I) You are currently treated with one of the following prophylactic therapies: (a) Amitriptyline (Elavil). (b) A beta-blocker (that is, atenolol, metoprolol, nadolol, propranolol, or timolol). (c) A calcitonin gene-related peptide receptor antagonist or inhibitor for preventive treatment  ofmigraine (that is, Aimovig, Ajovy, Emgality, Qulipta, Vyepti)*. (d) Divalproex sodium (Depakote/Depakote ER). (e) OnabotulinumtoxinA (Botox)*. (f) Topiramate (Topamax). (g) Venlafaxine (Effexor/Effexor XR). (h) Candesartan (Atacand). (II) You have 4 or more migraine days per month and have a contraindication or intolerance to one of the following prophylactic therapies: (a) Amitriptyline (Elavil). (b) A beta-blocker (that is, atenolol, metoprolol, nadolol, propranolol, or timolol). (c) A calcitonin gene-related peptide receptor antagonist or inhibitor for preventive treatment of migraine (that is, Aimovig, Ajovy, Emgality, Qulipta, Vyepti)*. (d) Divalproex sodium (Depakote/Depakote ER). (e) OnabotulinumtoxinA (Botox)*. (f) Topiramate (Topamax). (g) Venlafaxine (Effexor/Effexor XR). (h) Candesartan (Atacand). (2) All of the following: (A) You have a diagnosis of episodic migraines with greater than or equal to four migraine days per month. (B) The drug is used for preventive treatment of migraines. The information provided does not show  that you meet the criteria listed.

## 2021-11-17 NOTE — Telephone Encounter (Signed)
Nurtec appeal letter signed, denial letter, appeal letter and office notes faxed to [facility] appeals. Received confirmation. Determination within 30 days.  Sent patient my chart to update.

## 2021-11-17 NOTE — Telephone Encounter (Signed)
Nurtec appeal letter on MD desk for signature.

## 2021-11-29 NOTE — Telephone Encounter (Signed)
Nurtec approved thru 11/20/22. Patient is aware.

## 2021-12-12 ENCOUNTER — Other Ambulatory Visit: Payer: Self-pay | Admitting: Emergency Medicine

## 2021-12-12 DIAGNOSIS — I10 Essential (primary) hypertension: Secondary | ICD-10-CM

## 2021-12-12 DIAGNOSIS — E785 Hyperlipidemia, unspecified: Secondary | ICD-10-CM

## 2021-12-19 ENCOUNTER — Other Ambulatory Visit: Payer: Self-pay | Admitting: Orthopedic Surgery

## 2021-12-19 DIAGNOSIS — M542 Cervicalgia: Secondary | ICD-10-CM

## 2021-12-19 DIAGNOSIS — M545 Low back pain, unspecified: Secondary | ICD-10-CM

## 2021-12-27 ENCOUNTER — Ambulatory Visit
Admission: RE | Admit: 2021-12-27 | Discharge: 2021-12-27 | Disposition: A | Discharge status: Home / Self Care | Payer: 59 | Source: Ambulatory Visit | Attending: Orthopedic Surgery | Admitting: Orthopedic Surgery

## 2021-12-27 DIAGNOSIS — M545 Low back pain, unspecified: Secondary | ICD-10-CM

## 2021-12-27 DIAGNOSIS — M542 Cervicalgia: Secondary | ICD-10-CM

## 2022-01-02 ENCOUNTER — Encounter: Payer: Self-pay | Admitting: Emergency Medicine

## 2022-01-02 ENCOUNTER — Ambulatory Visit: Payer: 59 | Admitting: Emergency Medicine

## 2022-01-02 ENCOUNTER — Other Ambulatory Visit: Payer: Self-pay | Admitting: Emergency Medicine

## 2022-01-02 VITALS — BP 128/88 | HR 90 | Temp 98.3°F | Ht 63.0 in | Wt 209.0 lb

## 2022-01-02 DIAGNOSIS — G4700 Insomnia, unspecified: Secondary | ICD-10-CM | POA: Insufficient documentation

## 2022-01-02 DIAGNOSIS — I10 Essential (primary) hypertension: Secondary | ICD-10-CM | POA: Diagnosis not present

## 2022-01-02 DIAGNOSIS — E785 Hyperlipidemia, unspecified: Secondary | ICD-10-CM | POA: Diagnosis not present

## 2022-01-02 MED ORDER — ZOLPIDEM TARTRATE 5 MG PO TABS: 5.0000 mg | ORAL_TABLET | Freq: Every evening | ORAL | 1 refills | Status: DC | PRN

## 2022-01-02 NOTE — Assessment & Plan Note (Signed)
Chronic and affecting quality of life. May benefit from Ambien 5 mg daily. 

## 2022-01-02 NOTE — Assessment & Plan Note (Signed)
Well-controlled hypertension. Continue Diovan HCT 80-12.5 mg daily BP Readings from Last 3 Encounters:  01/02/22 128/88  11/15/21 135/89  08/15/21 (!) 145/93

## 2022-01-02 NOTE — Assessment & Plan Note (Signed)
Stable.  Diet and nutrition discussed. Continue rosuvastatin 10 mg daily. The 10-year ASCVD risk score (Arnett DK, et al., 2019) is: 6.7%   Values used to calculate the score:     Age: 46 years     Sex: Female     Is Non-Hispanic African American: Yes     Diabetic: No     Tobacco smoker: Yes     Systolic Blood Pressure: 579 mmHg     Is BP treated: Yes     HDL Cholesterol: 54.6 mg/dL     Total Cholesterol: 233 mg/dL

## 2022-01-02 NOTE — Progress Notes (Signed)
Angela Farrell 46 y.o.   Chief complaint: Follow-up of hypertension and dyslipidemia.  HISTORY OF PRESENT ILLNESS: This is a 46 y.o. female here for follow-up of hypertension and dyslipidemia. Also complaining of inability to sleep. No other complaints or medical concerns today.  HPI   Prior to Admission medications   Medication Sig Start Date End Date Taking? Authorizing Provider  B Complex-C (SUPER B COMPLEX/VITAMIN C PO) Take by mouth.   Yes [provider]  Cholecalciferol (D3-1000 PO) Take 2,000 Units by mouth.   Yes [provider]  cyclobenzaprine (FLEXERIL) 10 MG tablet TAKE 1 TABLET BY MOUTH EVERYDAY AT BEDTIME 06/07/21  Yes Jini Horiuchi, Eilleen Kempf, MD  etonogestrel (NEXPLANON) 68 MG IMPL implant Nexplanon 68 mg subdermal implant  Removal due 02/2019   Yes [provider]  Magnesium 500 MG CAPS Take 500 mg by mouth 4 (four) times daily.   Yes [provider]  meloxicam (MOBIC) 15 MG tablet Take 1 tablet by mouth daily. 09/01/20  Yes [provider]  omega-3 acid ethyl esters (LOVAZA) 1 g capsule Take by mouth 2 (two) times daily.   Yes [provider]  Potassium Gluconate 595 MG CAPS Take 595 mg by mouth daily.   Yes [provider]  Rimegepant Sulfate (NURTEC) 75 MG TBDP Take 75 mg by mouth every other day. 11/15/21  Yes Penumalli, [name], MD  rizatriptan (MAXALT-MLT) 10 MG disintegrating tablet Take 1 tablet (10 mg total) by mouth as needed for migraine (may repeat x 1 tab after 2 hours; max 2 tabs per day or 8 tabs per month). May repeat in 2 hours if needed 11/15/21  Yes Penumalli, [name], MD  rosuvastatin (CRESTOR) 10 MG tablet TAKE 1 TABLET BY MOUTH EVERY DAY 12/12/21  Yes Daviana Haymaker, Eilleen Kempf, MD  valACYclovir (VALTREX) 500 MG tablet Take 1 tablet (500 mg total) by mouth 2 (two) times daily. 08/15/21  Yes Daiva Eves, Lisette Grinder, MD  valsartan-hydrochlorothiazide (DIOVAN-HCT) 80-12.5 MG tablet TAKE 1 TABLET BY  MOUTH EVERY DAY 12/12/21  Yes Addilynn Mowrer, Eilleen Kempf, MD  vitamin A 8000 UNIT capsule Take 8,000 Units by mouth daily.   Yes [provider]  vitamin C (ASCORBIC ACID) 500 MG tablet Take 500 mg by mouth daily.   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   Pityriasis versicolor 08/12/2018   Depression 11/03/2014   Anxiety 01/18/2012   Chronic anemia 10/14/2011   Chronic headache disorder 04/18/2011   Obese 04/18/2011   Chronic rhinitis 04/18/2011    Class: Chronic    Past Medical History:  Diagnosis Date   Allergy    Anemia    Anxiety    Arthritis    Benign recurrent meningitis (mollaret) 11/18/2018   Chronic headache disorder    s/p Meningitis x 2, tension- type plus allergic/sinus problems   COVID 08/16/2020   COVID-19 05/2020   History of abnormal Pap smear    Meningitis, viral At age 54 and at age 80   Nasal fracture 2011   Other and unspecified ovarian cysts 2011- pelvic CT   GYN- Dr. Chevis Pretty   Vaginal spotting 06/04/2019   Vision abnormalities     Past Surgical History:  Procedure Laterality Date   BACK SURGERY  02/09/15   L5 - S1 diskectomy   FOOT SURGERY  s/p 2 procedures   LAPAROSCOPY FOR ECTOPIC PREGNANCY  1998   Fallopian tube removed   MOUTH SURGERY     spinal injection  03/10/15   SPINE SURGERY     UTERINE ARTERY EMBOLIZATION      Social History   Socioeconomic History   Marital status: Married    Spouse name: [name]   Number of children: Not on file   Years of education: Not on file   Highest education level: Not on file  Occupational History   Occupation: Chief Financial Officer: [employer]  Tobacco Use   Smoking status: Some Days    Years:  20.00    Types: Cigarettes   Smokeless tobacco: Never  Vaping Use   Vaping Use: Never used  Substance and Sexual Activity   Alcohol use: Yes    Alcohol/week: 1.0 standard drink of alcohol    Types: 1 Standard drinks or equivalent per week   Drug use: Yes    Types: Marijuana    Comment: per pt "few times a year"   Sexual activity: Yes    Partners: Male    Birth control/protection: Implant    Comment: sexually active w/ female husband  Other Topics Concern   Not on file  Social History Narrative   Drinks about 12-16oz of coffee a day, occasionally drinks a tea.   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    Social Determinants of Health   Financial Resource Strain: Not on file  Food Insecurity: Not on file  Transportation Needs: Not on file  Physical Activity: Unknown (02/17/2018)   Exercise Vital Sign    Days of Exercise per Week: 0 days    Minutes of Exercise per Session: Not on file  Stress: Not on file  Social Connections: Not on file  Intimate Partner Violence: Not on file    Family History  Problem Relation Age of Onset   Migraines Father        cluster headache   Hyperlipidemia Father    Hypertension Father    Cancer Mother 46       Breast   Stroke Maternal Grandmother    Diabetes Paternal Grandmother    Diabetes Other        paternal relaitve   Seizures Other        maternal relative   Rheum arthritis Other        maternal relatives   Colon polyps Neg Hx    Esophageal cancer Neg Hx    Stomach cancer Neg Hx    Rectal cancer Neg Hx      Review of Systems  Constitutional: Negative.  Negative for fever.  HENT: Negative.  Negative for congestion and sore throat.   Respiratory: Negative.  Negative for cough and shortness of breath.   Cardiovascular: Negative.  Negative for chest pain and palpitations.  Gastrointestinal:  Negative for abdominal pain, diarrhea, nausea and vomiting.  Musculoskeletal: Negative.   Skin: Negative.  Negative for rash.  Neurological:   Negative for dizziness and headaches.  Psychiatric/Behavioral:  The patient has insomnia.   All other systems reviewed and are negative.   Today's Vitals   01/02/22 1549  BP: 128/88  Pulse: 90  Temp: 98.3 F (36.8 C)  TempSrc: Oral  SpO2: 99%  Weight: 209 lb (94.8 kg)  Height: 5\' 3"  (1.6 m)   Body mass  index is 37.02 kg/m.  Physical Exam Vitals reviewed.  Constitutional:      Appearance: Normal appearance.  HENT:     Head: Normocephalic.  Eyes:     Extraocular Movements: Extraocular movements intact.     Pupils: Pupils are equal, round, and reactive to light.  Cardiovascular:     Rate and Rhythm: Normal rate and regular rhythm.     Pulses: Normal pulses.     Heart sounds: Normal heart sounds.  Pulmonary:     Effort: Pulmonary effort is normal.     Breath sounds: Normal breath sounds.  Musculoskeletal:        General: Normal range of motion.     Cervical back: No tenderness.  Lymphadenopathy:     Cervical: No cervical adenopathy.  Skin:    General: Skin is warm and dry.  Neurological:     General: No focal deficit present.     Mental Status: She is alert and oriented to person, place, and time.  Psychiatric:        Mood and Affect: Mood normal.        Behavior: Behavior normal.      ASSESSMENT & PLAN: A total of 40 minutes was spent with the patient and counseling/coordination of care regarding preparing for this visit, review of most recent office visit notes, review of multiple chronic medical problems and their management, review of all medications, review of most recent blood work results, education on nutrition, cardiovascular risk associated with hypertension and dyslipidemia, treatment of insomnia, prognosis, documentation, need for follow-up.  Problem List Items Addressed This Visit       Cardiovascular and Mediastinum   Essential hypertension - Primary    Well-controlled hypertension. Continue Diovan HCT 80-12.5 mg daily BP Readings from Last 3  Encounters:  01/02/22 128/88  11/15/21 135/89  08/15/21 (!) 145/93           Other   Dyslipidemia    Stable.  Diet and nutrition discussed. Continue rosuvastatin 10 mg daily. The 10-year ASCVD risk score (Arnett DK, et al., 2019) is: 6.7%   Values used to calculate the score:     Age: 42 years     Sex: Female     Is Non-Hispanic African American: Yes     Diabetic: No     Tobacco smoker: Yes     Systolic Blood Pressure: 093 mmHg     Is BP treated: Yes     HDL Cholesterol: 54.6 mg/dL     Total Cholesterol: 233 mg/dL       Insomnia    Chronic and affecting quality of life. May benefit from Ambien 5 mg daily.      Relevant Medications   zolpidem (AMBIEN) 5 MG tablet   Patient Instructions  Hypertension, Adult High blood pressure (hypertension) is when the force of blood pumping through the arteries is too strong. The arteries are the blood vessels that carry blood from the heart throughout the body. Hypertension forces the heart to work harder to pump blood and may cause arteries to become narrow or stiff. Untreated or uncontrolled hypertension can lead to a heart attack, heart failure, a stroke, kidney disease, and other problems. A blood pressure reading consists of a higher number over a lower number. Ideally, your blood pressure should be below 120/80. The first ("top") number is called the systolic pressure. It is a measure of the pressure in your arteries as your heart beats. The second ("bottom") number is called the diastolic pressure.  It is a measure of the pressure in your arteries as the heart relaxes. What are the causes? The exact cause of this condition is not known. There are some conditions that result in high blood pressure. What increases the risk? Certain factors may make you more likely to develop high blood pressure. Some of these risk factors are under your control, including: Smoking. Not getting enough exercise or physical activity. Being  overweight. Having too much fat, sugar, calories, or salt (sodium) in your diet. Drinking too much alcohol. Other risk factors include: Having a personal history of heart disease, diabetes, high cholesterol, or kidney disease. Stress. Having a family history of high blood pressure and high cholesterol. Having obstructive sleep apnea. Age. The risk increases with age. What are the signs or symptoms? High blood pressure may not cause symptoms. Very high blood pressure (hypertensive crisis) may cause: Headache. Fast or irregular heartbeats (palpitations). Shortness of breath. Nosebleed. Nausea and vomiting. Vision changes. Severe chest pain, dizziness, and seizures. How is this diagnosed? This condition is diagnosed by measuring your blood pressure while you are seated, with your arm resting on a flat surface, your legs uncrossed, and your feet flat on the floor. The cuff of the blood pressure monitor will be placed directly against the skin of your upper arm at the level of your heart. Blood pressure should be measured at least twice using the same arm. Certain conditions can cause a difference in blood pressure between your right and left arms. If you have a high blood pressure reading during one visit or you have normal blood pressure with other risk factors, you may be asked to: Return on a different day to have your blood pressure checked again. Monitor your blood pressure at home for 1 week or longer. If you are diagnosed with hypertension, you may have other blood or imaging tests to help your health care provider understand your overall risk for other conditions. How is this treated? This condition is treated by making healthy lifestyle changes, such as eating healthy foods, exercising more, and reducing your alcohol intake. You may be referred for counseling on a healthy diet and physical activity. Your health care provider may prescribe medicine if lifestyle changes are not enough to  get your blood pressure under control and if: Your systolic blood pressure is above 130. Your diastolic blood pressure is above 80. Your personal target blood pressure may vary depending on your medical conditions, your age, and other factors. Follow these instructions at home: Eating and drinking  Eat a diet that is high in fiber and potassium, and low in sodium, added sugar, and fat. An example of this eating plan is called the DASH diet. DASH stands for Dietary Approaches to Stop Hypertension. To eat this way: Eat plenty of fresh fruits and vegetables. Try to fill one half of your plate at each meal with fruits and vegetables. Eat whole grains, such as whole-wheat pasta, brown rice, or whole-grain bread. Fill about one fourth of your plate with whole grains. Eat or drink low-fat dairy products, such as skim milk or low-fat yogurt. Avoid fatty cuts of meat, processed or cured meats, and poultry with skin. Fill about one fourth of your plate with lean proteins, such as fish, chicken without skin, beans, eggs, or tofu. Avoid pre-made and processed foods. These tend to be higher in sodium, added sugar, and fat. Reduce your daily sodium intake. Many people with hypertension should eat less than 1,500 mg of sodium a day.  Do not drink alcohol if: Your health care provider tells you not to drink. You are pregnant, may be pregnant, or are planning to become pregnant. If you drink alcohol: Limit how much you have to: 0-1 drink a day for women. 0-2 drinks a day for men. Know how much alcohol is in your drink. In the [place] one drink equals one 12 oz bottle of beer (355 mL), one 5 oz glass of wine (148 mL), or one 1 oz glass of hard liquor (44 mL). Lifestyle  Work with your health care provider to maintain a healthy body weight or to lose weight. Ask what an ideal weight is for you. Get at least 30 minutes of exercise that causes your heart to beat faster (aerobic exercise) most days of the week.  Activities may include walking, swimming, or biking. Include exercise to strengthen your muscles (resistance exercise), such as Pilates or lifting weights, as part of your weekly exercise routine. Try to do these types of exercises for 30 minutes at least 3 days a week. Do not use any products that contain nicotine or tobacco. These products include cigarettes, chewing tobacco, and vaping devices, such as e-cigarettes. If you need help quitting, ask your health care provider. Monitor your blood pressure at home as told by your health care provider. Keep all follow-up visits. This is important. Medicines Take over-the-counter and prescription medicines only as told by your health care provider. Follow directions carefully. Blood pressure medicines must be taken as prescribed. Do not skip doses of blood pressure medicine. Doing this puts you at risk for problems and can make the medicine less effective. Ask your health care provider about side effects or reactions to medicines that you should watch for. Contact a health care provider if you: Think you are having a reaction to a medicine you are taking. Have headaches that keep coming back (recurring). Feel dizzy. Have swelling in your ankles. Have trouble with your vision. Get help right away if you: Develop a severe headache or confusion. Have unusual weakness or numbness. Feel faint. Have severe pain in your chest or abdomen. Vomit repeatedly. Have trouble breathing. These symptoms may be an emergency. Get help right away. Call 911. Do not wait to see if the symptoms will go away. Do not drive yourself to the hospital. Summary Hypertension is when the force of blood pumping through your arteries is too strong. If this condition is not controlled, it may put you at risk for serious complications. Your personal target blood pressure may vary depending on your medical conditions, your age, and other factors. For most people, a normal blood  pressure is less than 120/80. Hypertension is treated with lifestyle changes, medicines, or a combination of both. Lifestyle changes include losing weight, eating a healthy, low-sodium diet, exercising more, and limiting alcohol. This information is not intended to replace advice given to you by your health care provider. Make sure you discuss any questions you have with your health care provider. Document Revised: 01/25/2021 Document Reviewed: 01/25/2021 Elsevier Patient Education  2023 Elsevier Inc.    Edwina BarthMiguel Lottie Siska, MD [facility]

## 2022-01-02 NOTE — Patient Instructions (Signed)
Hypertension, Adult High blood pressure (hypertension) is when the force of blood pumping through the arteries is too strong. The arteries are the blood vessels that carry blood from the heart throughout the body. Hypertension forces the heart to work harder to pump blood and may cause arteries to become narrow or stiff. Untreated or uncontrolled hypertension can lead to a heart attack, heart failure, a stroke, kidney disease, and other problems. A blood pressure reading consists of a higher number over a lower number. Ideally, your blood pressure should be below 120/80. The first ("top") number is called the systolic pressure. It is a measure of the pressure in your arteries as your heart beats. The second ("bottom") number is called the diastolic pressure. It is a measure of the pressure in your arteries as the heart relaxes. What are the causes? The exact cause of this condition is not known. There are some conditions that result in high blood pressure. What increases the risk? Certain factors may make you more likely to develop high blood pressure. Some of these risk factors are under your control, including: Smoking. Not getting enough exercise or physical activity. Being overweight. Having too much fat, sugar, calories, or salt (sodium) in your diet. Drinking too much alcohol. Other risk factors include: Having a personal history of heart disease, diabetes, high cholesterol, or kidney disease. Stress. Having a family history of high blood pressure and high cholesterol. Having obstructive sleep apnea. Age. The risk increases with age. What are the signs or symptoms? High blood pressure may not cause symptoms. Very high blood pressure (hypertensive crisis) may cause: Headache. Fast or irregular heartbeats (palpitations). Shortness of breath. Nosebleed. Nausea and vomiting. Vision changes. Severe chest pain, dizziness, and seizures. How is this diagnosed? This condition is diagnosed by  measuring your blood pressure while you are seated, with your arm resting on a flat surface, your legs uncrossed, and your feet flat on the floor. The cuff of the blood pressure monitor will be placed directly against the skin of your upper arm at the level of your heart. Blood pressure should be measured at least twice using the same arm. Certain conditions can cause a difference in blood pressure between your right and left arms. If you have a high blood pressure reading during one visit or you have normal blood pressure with other risk factors, you may be asked to: Return on a different day to have your blood pressure checked again. Monitor your blood pressure at home for 1 week or longer. If you are diagnosed with hypertension, you may have other blood or imaging tests to help your health care provider understand your overall risk for other conditions. How is this treated? This condition is treated by making healthy lifestyle changes, such as eating healthy foods, exercising more, and reducing your alcohol intake. You may be referred for counseling on a healthy diet and physical activity. Your health care provider may prescribe medicine if lifestyle changes are not enough to get your blood pressure under control and if: Your systolic blood pressure is above 130. Your diastolic blood pressure is above 80. Your personal target blood pressure may vary depending on your medical conditions, your age, and other factors. Follow these instructions at home: Eating and drinking  Eat a diet that is high in fiber and potassium, and low in sodium, added sugar, and fat. An example of this eating plan is called the DASH diet. DASH stands for Dietary Approaches to Stop Hypertension. To eat this way: Eat   plenty of fresh fruits and vegetables. Try to fill one half of your plate at each meal with fruits and vegetables. Eat whole grains, such as whole-wheat pasta, brown rice, or whole-grain bread. Fill about one  fourth of your plate with whole grains. Eat or drink low-fat dairy products, such as skim milk or low-fat yogurt. Avoid fatty cuts of meat, processed or cured meats, and poultry with skin. Fill about one fourth of your plate with lean proteins, such as fish, chicken without skin, beans, eggs, or tofu. Avoid pre-made and processed foods. These tend to be higher in sodium, added sugar, and fat. Reduce your daily sodium intake. Many people with hypertension should eat less than 1,500 mg of sodium a day. Do not drink alcohol if: Your health care provider tells you not to drink. You are pregnant, may be pregnant, or are planning to become pregnant. If you drink alcohol: Limit how much you have to: 0-1 drink a day for women. 0-2 drinks a day for men. Know how much alcohol is in your drink. In the [place] one drink equals one 12 oz bottle of beer (355 mL), one 5 oz glass of wine (148 mL), or one 1 oz glass of hard liquor (44 mL). Lifestyle  Work with your health care provider to maintain a healthy body weight or to lose weight. Ask what an ideal weight is for you. Get at least 30 minutes of exercise that causes your heart to beat faster (aerobic exercise) most days of the week. Activities may include walking, swimming, or biking. Include exercise to strengthen your muscles (resistance exercise), such as Pilates or lifting weights, as part of your weekly exercise routine. Try to do these types of exercises for 30 minutes at least 3 days a week. Do not use any products that contain nicotine or tobacco. These products include cigarettes, chewing tobacco, and vaping devices, such as e-cigarettes. If you need help quitting, ask your health care provider. Monitor your blood pressure at home as told by your health care provider. Keep all follow-up visits. This is important. Medicines Take over-the-counter and prescription medicines only as told by your health care provider. Follow directions carefully. Blood  pressure medicines must be taken as prescribed. Do not skip doses of blood pressure medicine. Doing this puts you at risk for problems and can make the medicine less effective. Ask your health care provider about side effects or reactions to medicines that you should watch for. Contact a health care provider if you: Think you are having a reaction to a medicine you are taking. Have headaches that keep coming back (recurring). Feel dizzy. Have swelling in your ankles. Have trouble with your vision. Get help right away if you: Develop a severe headache or confusion. Have unusual weakness or numbness. Feel faint. Have severe pain in your chest or abdomen. Vomit repeatedly. Have trouble breathing. These symptoms may be an emergency. Get help right away. Call 911. Do not wait to see if the symptoms will go away. Do not drive yourself to the hospital. Summary Hypertension is when the force of blood pumping through your arteries is too strong. If this condition is not controlled, it may put you at risk for serious complications. Your personal target blood pressure may vary depending on your medical conditions, your age, and other factors. For most people, a normal blood pressure is less than 120/80. Hypertension is treated with lifestyle changes, medicines, or a combination of both. Lifestyle changes include losing weight, eating a healthy,   low-sodium diet, exercising more, and limiting alcohol. This information is not intended to replace advice given to you by your health care provider. Make sure you discuss any questions you have with your health care provider. Document Revised: 01/25/2021 Document Reviewed: 01/25/2021 Elsevier Patient Education  2023 Elsevier Inc.  

## 2022-01-09 ENCOUNTER — Other Ambulatory Visit: Payer: Self-pay | Admitting: Orthopedic Surgery

## 2022-01-09 DIAGNOSIS — M545 Low back pain, unspecified: Secondary | ICD-10-CM

## 2022-01-12 ENCOUNTER — Encounter: Payer: Self-pay | Admitting: Emergency Medicine

## 2022-01-16 NOTE — Telephone Encounter (Signed)
This is a question for her OB/GYN doctor.  I do not have any effective recommendations.

## 2022-01-27 ENCOUNTER — Ambulatory Visit
Admission: RE | Admit: 2022-01-27 | Discharge: 2022-01-27 | Disposition: A | Discharge status: Home / Self Care | Payer: 59 | Source: Ambulatory Visit | Attending: Orthopedic Surgery | Admitting: Orthopedic Surgery

## 2022-01-27 DIAGNOSIS — M545 Low back pain, unspecified: Secondary | ICD-10-CM

## 2022-01-27 MED ORDER — METHYLPREDNISOLONE ACETATE 40 MG/ML INJ SUSP (RADIOLOG: 80.0000 mg | Freq: Once | INTRAMUSCULAR | Status: DC

## 2022-02-02 ENCOUNTER — Other Ambulatory Visit: Payer: Self-pay | Admitting: Orthopedic Surgery

## 2022-02-02 DIAGNOSIS — M545 Low back pain, unspecified: Secondary | ICD-10-CM

## 2022-03-20 ENCOUNTER — Ambulatory Visit: Payer: 59 | Admitting: Adult Health

## 2022-04-18 ENCOUNTER — Ambulatory Visit: Payer: 59 | Admitting: Diagnostic Neuroimaging

## 2022-04-18 ENCOUNTER — Encounter: Payer: Self-pay | Admitting: Diagnostic Neuroimaging

## 2022-04-18 VITALS — BP 120/75 | HR 113 | Ht 63.0 in | Wt 200.0 lb

## 2022-04-18 DIAGNOSIS — G43109 Migraine with aura, not intractable, without status migrainosus: Secondary | ICD-10-CM | POA: Diagnosis not present

## 2022-04-18 MED ORDER — NURTEC 75 MG PO TBDP: 75.0000 mg | ORAL_TABLET | ORAL | 12 refills | Status: DC

## 2022-04-18 MED ORDER — RIZATRIPTAN BENZOATE 10 MG PO TBDP: 10.0000 mg | ORAL_TABLET | ORAL | 12 refills | Status: DC | PRN

## 2022-04-18 NOTE — Patient Instructions (Signed)
MIGRAINE WITHOUT AURA (2-3 migraines per month) + CHRONIC DAILY HEADACHES (not controlled) - nurtec 75mg  every other day (migraine prevention) - rizatriptan as needed for breakthrough migraine  ANXIETY / INSOMNIA - consider SSRI - consider psychology evaluation - sleep hygiene review

## 2022-04-18 NOTE — Progress Notes (Signed)
[facility]  PATIENT: Angela Farrell DOB: 10-11-1975  REFERRING CLINICIAN: Sag[place], [place] FROM: patient REASON FOR VISIT: follow up   HISTORICAL  CHIEF COMPLAINT:  Chief Complaint  Patient presents with   Follow-up    Patient in room #7 and alone. Patient here today for f/u on her migraines. Patient has a headaches today.    HISTORY OF PRESENT ILLNESS:   UPDATE (04/18/22, VRP): Since last visit, doing fair. Since Nov 2023, almost daily HA. 2-3 per month now. Now on nurtec every other daily. Rizatriptan helps.   UPDATE (11/15/21, VRP): Here for migraine follow up. Avg ~5+ migraines per month. Not on rizatriptan (refills ran out; previously only getting 4 tabs per month. ? Insurance reason?).   UPDATE (12/10/17, VRP): Since last visit, doing about the same. Symptoms are moderate. Still with daily headaches + 2 severe migraines per month. No alleviating or aggravating factors. Tolerating meds.    UPDATE 10/23/16: Since last visit, was doing well until last few months. Now daily headaches, and severe migraine 1 per week. [place] 4th week, and having more migraines.   UPDATE 04/25/16: Since last visit, doing well. Only 2-3 migraine in the last 6-7 months. Much improved.  Last migraine was a few weeks ago. Has been doing better with yoga and exercise.  UPDATE 09/21/15: Since last visit, overall stable. Avg 1-2 migraine per month. Avg 3-4 HA per week. Currently with right forehead headache.   UPDATE 06/21/15: Since last visit, was doing well with higher TPX, but now more work stress, and more HA since March 2017.  UPDATE 03/23/15: Since last visit, was doing better with TPX + rizatriptan; then had back surgery Feb 09, 2015; then Dec 2016 having more HA.  PRIOR HPI (12/25/14): 47 year old ambidextrous female here for evaluation of headaches. 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 Patient has history of headaches starting in childhood around 47 years old. She describes frontal, eye pressure, throbbing severe headaches with neck pain, photophobia, phonophobia and occasionally nausea. Headaches can last hours to days at a time. Triggers include stress. Over the years headaches varied in frequency and severity. More recently headaches have been occurring roughly 4-6 days per month. Patient has family history of cluster headache in her father. Patient herself has never been diagnosed with migraine headaches or tried migraine medications. Patient had been hospitalized several times for headache, light sensitivity, neck pain, fevers, with testing and lumbar puncture results demonstrating viral/aseptic meningitis. 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 On the basis of this patient was diagnosed with recurrent meningitis (Mollaret's) in 2013. Since that time she has been prescribed valacyclovir 500 mg twice a day to be taken during times of recurrence of symptoms. She uses this 6-8 months out of the year, 3-4 days at a time. This has helped keep some of her symptoms under control. Her last hospitalization was in 2013 for meningitis. More recently, last night patient had recurrence of headache. She had some nausea and vomiting last night. This morning and during office visit patient is suffering with severe headache, sensitivity to light, neck pain. Patient states that her current symptoms feel slightly similar to her prior meningitis attacks but not as severe. Patient has not started valacyclovir yet for this attack.   REVIEW OF SYSTEMS: Full 14 system review of systems performed and negative except for: anemia runny nose neck  pain freq waking.   ALLERGIES: Allergies  Allergen Reactions   Adhesive [Tape] Rash   Other Rash    HOME MEDICATIONS: Outpatient Medications Prior to Visit  Medication Sig Dispense Refill   B Complex-C (SUPER B COMPLEX/VITAMIN C PO) Take by mouth.      Cholecalciferol (D3-1000 PO) Take 2,000 Units by mouth.     Magnesium 500 MG CAPS Take 500 mg by mouth 4 (four) times daily.     omega-3 acid ethyl esters (LOVAZA) 1 g capsule Take by mouth 2 (two) times daily.     Potassium Gluconate 595 MG CAPS Take 595 mg by mouth daily.     rosuvastatin (CRESTOR) 10 MG tablet TAKE 1 TABLET BY MOUTH EVERY DAY 90 tablet 3   valACYclovir (VALTREX) 500 MG tablet Take 1 tablet (500 mg total) by mouth 2 (two) times daily. 60 tablet 11   valsartan-hydrochlorothiazide (DIOVAN-HCT) 80-12.5 MG tablet TAKE 1 TABLET BY MOUTH EVERY DAY 90 tablet 3   vitamin A 8000 UNIT capsule Take 8,000 Units by mouth daily.     vitamin C (ASCORBIC ACID) 500 MG tablet Take 500 mg by mouth daily.     zolpidem (AMBIEN) 5 MG tablet Take 1 tablet (5 mg total) by mouth at bedtime as needed for sleep. 15 tablet 1   Rimegepant Sulfate (NURTEC) 75 MG TBDP Take 75 mg by mouth every other day. 15 tablet 12   rizatriptan (MAXALT-MLT) 10 MG disintegrating tablet Take 1 tablet (10 mg total) by mouth as needed for migraine (may repeat x 1 tab after 2 hours; max 2 tabs per day or 8 tabs per month). May repeat in 2 hours if needed 8 tablet 12   cyclobenzaprine (FLEXERIL) 10 MG tablet TAKE 1 TABLET BY MOUTH EVERYDAY AT BEDTIME 30 tablet 1   etonogestrel (NEXPLANON) 68 MG IMPL implant Nexplanon 68 mg subdermal implant  Removal due 02/2019     meloxicam (MOBIC) 15 MG tablet Take 1 tablet by mouth daily. (Patient not taking: Reported on 04/18/2022)     No facility-administered medications prior to visit.    PAST MEDICAL HISTORY: Past Medical History:  Diagnosis Date   Allergy    Anemia    Anxiety    Arthritis    Benign recurrent meningitis (mollaret) 11/18/2018   Chronic headache disorder    s/p Meningitis x 2, tension- type plus allergic/sinus problems   COVID 08/16/2020   COVID-19 05/2020   History of abnormal Pap smear    Meningitis, viral At age 32 and at age 50   Nasal fracture 2011    Other and unspecified ovarian cysts 2011- pelvic CT   GYN- Dr. Chevis Pretty   Vaginal spotting 06/04/2019   Vision abnormalities     PAST SURGICAL HISTORY: Past Surgical History:  Procedure Laterality Date   BACK SURGERY  02/09/15   L5 - S1 diskectomy   FOOT SURGERY  s/p 2 procedures   LAPAROSCOPY FOR ECTOPIC PREGNANCY  1998   Fallopian tube removed   MOUTH SURGERY     spinal injection  03/10/15   SPINE SURGERY     UTERINE ARTERY EMBOLIZATION      FAMILY HISTORY: Family History  Problem Relation Age of Onset   Migraines Father        cluster headache   Hyperlipidemia Father    Hypertension Father    Cancer Mother 1       Breast   Stroke Maternal Grandmother    Diabetes Paternal Grandmother  Diabetes Other        paternal relaitve   Seizures Other        maternal relative   Rheum arthritis Other        maternal relatives   Colon polyps Neg Hx    Esophageal cancer Neg Hx    Stomach cancer Neg Hx    Rectal cancer Neg Hx     SOCIAL HISTORY:  Social History   Socioeconomic History   Marital status: Married    Spouse name: [name]   Number of children: Not on file   Years of education: Not on file   Highest education level: Not on file  Occupational History   Occupation: Hospital doctor: [employer]  Tobacco Use   Smoking status: Some Days    Years: 20.00    Types: Cigarettes   Smokeless tobacco: Never  Vaping Use   Vaping Use: Never used  Substance and Sexual Activity   Alcohol use: Yes    Alcohol/week: 1.0 standard drink of alcohol    Types: 1 Standard drinks or equivalent per week   Drug use: Yes    Types: Marijuana    Comment: per pt "few times a year"   Sexual activity: Yes    Partners: Male    Birth control/protection: Implant    Comment: sexually active w/ female husband  Other Topics Concern   Not on file  Social History Narrative   Drinks about 12-16oz of coffee a day, occasionally drinks a tea.   [employer] on Teaching laboratory technician as Administrator.     Social Determinants of Health   Financial Resource Strain: Not on file  Food Insecurity: Not on file  Transportation Needs: Not on file  Physical Activity: Unknown (02/17/2018)   Exercise Vital Sign    Days of Exercise per Week: 0 days    Minutes of Exercise per Session: Not on file  Stress: Not on file  Social Connections: Not on file  Intimate Partner Violence: Not on file     PHYSICAL EXAM  GENERAL EXAM/CONSTITUTIONAL: Vitals:  Vitals:   04/18/22 1600  BP: 120/75  Pulse: (!) 113  Weight: 200 lb (90.7 kg)  Height: 5\' 3"  (1.6 m)   Body mass index is 35.43 kg/m. Wt Readings from Last 3 Encounters:  04/18/22 200 lb (90.7 kg)  01/02/22 209 lb (94.8 kg)  11/15/21 211 lb 3.2 oz (95.8 kg)   Patient is in no distress; well developed, nourished and groomed; neck is supple  CARDIOVASCULAR: Examination of carotid arteries is normal; no carotid bruits Regular rate and rhythm, no murmurs Examination of peripheral vascular system by observation and palpation is normal  EYES: Ophthalmoscopic exam of optic discs and posterior segments is normal; no papilledema or hemorrhages No results found.  MUSCULOSKELETAL: Gait, strength, tone, movements noted in Neurologic exam below  NEUROLOGIC: MENTAL STATUS:      No data to display         awake, alert, oriented to person, place and time recent and remote memory intact normal attention and concentration language fluent, comprehension intact, naming intact fund of knowledge appropriate  CRANIAL NERVE:  2nd - no papilledema on fundoscopic exam 2nd, 3rd, 4th, 6th - pupils equal and reactive to light, visual fields full to confrontation, extraocular muscles intact, no nystagmus 5th - facial sensation symmetric 7th - facial strength symmetric 8th - hearing intact 9th - palate elevates symmetrically, uvula midline 11th - shoulder shrug symmetric 12th - tongue protrusion midline  MOTOR:  normal bulk and tone, full  strength in the BUE, BLE  SENSORY:  normal and symmetric to light touch, temperature, vibration  COORDINATION:  finger-nose-finger, fine finger movements normal  REFLEXES:  deep tendon reflexes present and symmetric  GAIT/STATION:  narrow based gait     DIAGNOSTIC DATA (LABS, IMAGING, TESTING) - I reviewed patient records, labs, notes, testing and imaging myself where available.  Lab Results  Component Value Date   WBC 6.7 09/02/2020   HGB 11.8 (L) 09/02/2020   HCT 35.9 (L) 09/02/2020   MCV 89.3 09/02/2020   PLT 281.0 09/02/2020      Component Value Date/Time   NA 137 12/30/2020 1616   NA 138 04/29/2020 1007   K 3.9 12/30/2020 1616   CL 104 12/30/2020 1616   CO2 26 12/30/2020 1616   GLUCOSE 91 12/30/2020 1616   BUN 10 12/30/2020 1616   BUN 7 04/29/2020 1007   CREATININE 0.79 12/30/2020 1616   CREATININE 0.78 12/21/2016 0855   CALCIUM 9.1 12/30/2020 1616   PROT 7.5 12/30/2020 1616   PROT 7.3 04/29/2020 1007   ALBUMIN 4.4 12/30/2020 1616   ALBUMIN 4.6 04/29/2020 1007   AST 27 12/30/2020 1616   ALT 32 12/30/2020 1616   ALKPHOS 58 12/30/2020 1616   BILITOT 0.5 12/30/2020 1616   BILITOT 0.6 04/29/2020 1007   GFRNONAA 84 04/29/2020 1007   GFRNONAA >89 10/14/2014 0944   GFRAA 96 04/29/2020 1007   GFRAA >89 10/14/2014 0944   Lab Results  Component Value Date   CHOL 233 (H) 12/30/2020   HDL 54.60 12/30/2020   LDLCALC 140 (H) 12/30/2020   TRIG 189.0 (H) 12/30/2020   CHOLHDL 4 12/30/2020   Lab Results  Component Value Date   HGBA1C 5.0 12/30/2020   No results found for: "VITAMINB12" Lab Results  Component Value Date   TSH 0.847 02/15/2018    10/22/14 MRI thoracic spine [I reviewed images myself and agree with interpretation. -VRP]  - Multilevel mild disc degeneration without definitive impingement. - Multilevel mild to moderate left foraminal stenoses are noted above.   11/27/14 MRI lumbar spine [I reviewed images myself and agree with interpretation.  -VRP]  - Disc bulging and facet hypertrophy L4-5 with mild to moderate spinal stenosis. - Large central disc protrusion L5-S1 with possible extruded disc fragments. There is severe spinal stenosis with compression of the thecal sac and S1 nerve roots bilaterally.    09/26/14 CT head [I reviewed images myself and agree with interpretation. -VRP]  - Stable and normal noncontrast CT appearance of the brain.  01/20/15 MRI brain  - normal  03/03/15 PSG / split - normal AHI - sleep fragmentation    ASSESSMENT AND PLAN  47 y.o. year old female here with history of recurrent aseptic/viral meningitis since 2008-2013, also with history of migraine type headaches since age 83 years old, never officially diagnosed. Also with recurrent migraine attacks.  Meds tried: sumatriptan, topiramate, propranolol, aimovig, emgality, ubrelvy, nurtec, rizatriptan  Dx:    Migraine with aura and without status migrainosus, not intractable    PLAN:  MIGRAINE WITHOUT AURA (2-3 migraines per month) + CHRONIC DAILY HEADACHES (not controlled) - nurtec 75mg  every other day (migraine prevention) - rizatriptan as needed for breakthrough migraine  ANXIETY / INSOMNIA - consider SSRI - consider psychology evaluation - sleep hygiene review  Meds ordered this encounter  Medications   Rimegepant Sulfate (NURTEC) 75 MG TBDP    Sig: Take 75 mg by mouth every other day.  Dispense:  15 tablet    Refill:  12    Migraine prevention dosing   rizatriptan (MAXALT-MLT) 10 MG disintegrating tablet    Sig: Take 1 tablet (10 mg total) by mouth as needed for migraine (may repeat x 1 tab after 2 hours; max 2 tabs per day or 8 tabs per month). May repeat in 2 hours if needed    Dispense:  8 tablet    Refill:  12   Return in about 6 months (around 10/17/2022).    Suanne Marker, MD 04/18/2022, 4:50 PM Certified in Neurology, Neurophysiology and Neuroimaging  [facility] [address], [address] [place], [place] [address] [phone]

## 2022-07-04 ENCOUNTER — Ambulatory Visit: Payer: 59 | Admitting: Emergency Medicine

## 2022-07-04 VITALS — BP 136/88 | HR 82 | Temp 98.1°F | Ht 63.0 in | Wt 209.1 lb

## 2022-07-04 DIAGNOSIS — G8929 Other chronic pain: Secondary | ICD-10-CM

## 2022-07-04 DIAGNOSIS — M25551 Pain in right hip: Secondary | ICD-10-CM | POA: Diagnosis not present

## 2022-07-04 DIAGNOSIS — E785 Hyperlipidemia, unspecified: Secondary | ICD-10-CM | POA: Diagnosis not present

## 2022-07-04 DIAGNOSIS — M7711 Lateral epicondylitis, right elbow: Secondary | ICD-10-CM

## 2022-07-04 DIAGNOSIS — M25552 Pain in left hip: Secondary | ICD-10-CM

## 2022-07-04 DIAGNOSIS — I10 Essential (primary) hypertension: Secondary | ICD-10-CM

## 2022-07-04 NOTE — Progress Notes (Signed)
Angela Farrell 47 y.o.   Chief Complaint  Patient presents with   Medical Management of Chronic Issues    39mnth f/u appt, patient is having issues with her right arm, right elbow,     HISTORY OF PRESENT ILLNESS: This is a 47 y.o. female here for 25-month follow-up of chronic medical problems including hypertension Also complaining of chronic pain to both hips and chronic pain to right elbow.  Has seen orthopedist in the past several months about this. Went through physical therapy and also elbow injection with results. No other complaints or medical concerns today.  HPI   Prior to Admission medications   Medication Sig Start Date End Date Taking? Authorizing Provider  Magnesium 500 MG CAPS Take 500 mg by mouth 4 (four) times daily.    [provider]  omega-3 acid ethyl esters (LOVAZA) 1 g capsule Take by mouth 2 (two) times daily.    [provider]  Potassium Gluconate 595 MG CAPS Take 595 mg by mouth daily.    [provider]  Rimegepant Sulfate (NURTEC) 75 MG TBDP Take 75 mg by mouth every other day. 04/18/22   Penumalli, Earlean Polka, MD  rizatriptan (MAXALT-MLT) 10 MG disintegrating tablet Take 1 tablet (10 mg total) by mouth as needed for migraine (may repeat x 1 tab after 2 hours; max 2 tabs per day or 8 tabs per month). May repeat in 2 hours if needed 04/18/22   Penumalli, Earlean Polka, MD  rosuvastatin (CRESTOR) 10 MG tablet TAKE 1 TABLET BY MOUTH EVERY DAY 12/12/21   Horald Pollen, MD  valACYclovir (VALTREX) 500 MG tablet Take 1 tablet (500 mg total) by mouth 2 (two) times daily. 08/15/21   Truman Hayward, MD  valsartan-hydrochlorothiazide (DIOVAN-HCT) 80-12.5 MG tablet TAKE 1 TABLET BY MOUTH EVERY DAY 12/12/21   Horald Pollen, MD  vitamin A 8000 UNIT capsule Take 8,000 Units by mouth daily.    [provider]  vitamin C (ASCORBIC ACID) 500 MG tablet Take 500 mg by mouth daily.    [provider]  zolpidem (AMBIEN) 5  MG tablet Take 1 tablet (5 mg total) by mouth at bedtime as needed for sleep. 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   Pityriasis versicolor 08/12/2018   Depression 11/03/2014   Anxiety 01/18/2012   Chronic anemia 10/14/2011   Chronic headache disorder 04/18/2011   Obese 04/18/2011   Chronic rhinitis 04/18/2011    Class: Chronic    Past Medical History:  Diagnosis Date   Allergy    Anemia    Anxiety    Arthritis    Benign recurrent meningitis (mollaret) 11/18/2018   Chronic headache disorder    s/p Meningitis x 2, tension- type plus allergic/sinus problems   COVID 08/16/2020   COVID-19 05/2020   History of abnormal Pap smear    Meningitis, viral At age 26 and at age 84   Nasal fracture 2011   Other and unspecified ovarian cysts 2011- pelvic CT   GYN- Dr. Carren Rang   Vaginal spotting 06/04/2019   Vision abnormalities     Past Surgical History:  Procedure Laterality Date  BACK SURGERY  02/09/15   L5 - S1 diskectomy   FOOT SURGERY  s/p 2 procedures   LAPAROSCOPY FOR ECTOPIC PREGNANCY  1998   Fallopian tube removed   MOUTH SURGERY     spinal injection  03/10/15   SPINE SURGERY     UTERINE ARTERY EMBOLIZATION      Social History   Socioeconomic History   Marital status: Married    Spouse name: Linna Hoff   Number of children: Not on file   Years of education: Not on file   Highest education level: Bachelor's degree (e.g., BA, AB, BS)  Occupational History   Occupation: Hospital doctor: [employer]  Tobacco Use   Smoking status: Some Days    Years: 20    Types: Cigarettes   Smokeless tobacco: Never  Vaping Use   Vaping Use: Never  used  Substance and Sexual Activity   Alcohol use: Yes    Alcohol/week: 1.0 standard drink of alcohol    Types: 1 Standard drinks or equivalent per week   Drug use: Yes    Types: Marijuana    Comment: per pt "few times a year"   Sexual activity: Yes    Partners: Male    Birth control/protection: Implant    Comment: sexually active w/ female husband  Other Topics Concern   Not on file  Social History Narrative   Drinks about 12-16oz of coffee a day, occasionally drinks a tea.   [employer] on Teaching laboratory technician as Administrator.    Social Determinants of Health   Financial Resource Strain: Low Risk  (07/04/2022)   Overall Financial Resource Strain (CARDIA)    Difficulty of Paying Living Expenses: Not hard at all  Food Insecurity: No Food Insecurity (07/04/2022)   Hunger Vital Sign    Worried About Running Out of Food in the Last Year: Never true    Ran Out of Food in the Last Year: Never true  Transportation Needs: No Transportation Needs (07/04/2022)   PRAPARE - Hydrologist (Medical): No    Lack of Transportation (Non-Medical): No  Physical Activity: Sufficiently Active (07/04/2022)   Exercise Vital Sign    Days of Exercise per Week: 5 days    Minutes of Exercise per Session: 30 min  Stress: Stress Concern Present (07/04/2022)   [place]    Feeling of Stress : To some extent  Social Connections: Moderately Isolated (07/04/2022)   Social Connection and Isolation Panel [NHANES]    Frequency of Communication with Friends and Family: Once a week    Frequency of Social Gatherings with Friends and Family: Once a week    Attends Religious Services: Never    Marine scientist or Organizations: Yes    Attends Music therapist: More than 4 times per year    Marital Status: Married  Human resources officer Violence: Not on file    Family History  Problem Relation Age of Onset   Migraines Father         cluster headache   Hyperlipidemia Father    Hypertension Father    Cancer Mother 64       Breast   Stroke Maternal Grandmother    Diabetes Paternal Grandmother    Diabetes Other        paternal relaitve   Seizures Other        maternal relative   Rheum arthritis Other  maternal relatives   Colon polyps Neg Hx    Esophageal cancer Neg Hx    Stomach cancer Neg Hx    Rectal cancer Neg Hx      Review of Systems  Constitutional: Negative.  Negative for chills and fever.  HENT: Negative.  Negative for congestion and sore throat.   Respiratory: Negative.  Negative for cough and shortness of breath.   Cardiovascular: Negative.  Negative for chest pain and palpitations.  Gastrointestinal:  Negative for abdominal pain, diarrhea, nausea and vomiting.  Musculoskeletal:  Positive for back pain and joint pain.  Skin: Negative.  Negative for rash.  Neurological: Negative.  Negative for dizziness and headaches.  All other systems reviewed and are negative.   Vitals:   07/04/22 1502  BP: 136/88  Pulse: 82  Temp: 98.1 F (36.7 C)  SpO2: 97%    Physical Exam Vitals reviewed.  Constitutional:      Appearance: Normal appearance.  HENT:     Head: Normocephalic.     Mouth/Throat:     Mouth: Mucous membranes are moist.     Pharynx: Oropharynx is clear.  Eyes:     Extraocular Movements: Extraocular movements intact.     Conjunctiva/sclera: Conjunctivae normal.     Pupils: Pupils are equal, round, and reactive to light.  Cardiovascular:     Rate and Rhythm: Normal rate and regular rhythm.     Pulses: Normal pulses.     Heart sounds: Normal heart sounds.  Pulmonary:     Effort: Pulmonary effort is normal.     Breath sounds: Normal breath sounds.  Musculoskeletal:        General: Normal range of motion.     Cervical back: No tenderness.     Comments: Right elbow: Mild tenderness to lateral epicondyle.  Full range of motion.  No other abnormal findings Hips: Full range of  motion  Lymphadenopathy:     Cervical: No cervical adenopathy.  Skin:    General: Skin is warm and dry.  Neurological:     General: No focal deficit present.     Mental Status: She is alert and oriented to person, place, and time.  Psychiatric:        Mood and Affect: Mood normal.        Behavior: Behavior normal.      ASSESSMENT & PLAN: A total of 43 minutes was spent with the patient and counseling/coordination of care regarding preparing for this visit, review of most recent office visit notes, review of multiple chronic medical conditions under management, review of all medications, pain management, need for orthopedic evaluation, cardiovascular risks associated with hypertension and dyslipidemia, education on nutrition, prognosis, documentation and need for follow-up.  Problem List Items Addressed This Visit       Cardiovascular and Mediastinum   Essential hypertension - Primary    Well-controlled hypertension Continue Diovan HCT 80-12.5 mg daily Cardiovascular risks associated with hypertension discussed Dietary approaches to stop hypertension discussed. BP Readings from Last 3 Encounters:  07/04/22 136/88  04/18/22 120/75  01/02/22 128/88          Musculoskeletal and Integument   Lateral epicondylitis of right elbow    Chronic and intermittent.  Not active today. Related to activities of daily living Pain management discussed Recommend to follow-up with orthopedist      Relevant Orders   Ambulatory referral to Sports Medicine     Other   Dyslipidemia    Chronic stable condition Continue rosuvastatin 10 mg daily  Chronic pain of both hips    Chronic pain affecting quality of life Pain management discussed Recommend orthopedic evaluation Referral placed today      Relevant Orders   Ambulatory referral to Sports Medicine   Patient Instructions  Hypertension, Adult High blood pressure (hypertension) is when the force of blood pumping through the  arteries is too strong. The arteries are the blood vessels that carry blood from the heart throughout the body. Hypertension forces the heart to work harder to pump blood and may cause arteries to become narrow or stiff. Untreated or uncontrolled hypertension can lead to a heart attack, heart failure, a stroke, kidney disease, and other problems. A blood pressure reading consists of a higher number over a lower number. Ideally, your blood pressure should be below 120/80. The first ("top") number is called the systolic pressure. It is a measure of the pressure in your arteries as your heart beats. The second ("bottom") number is called the diastolic pressure. It is a measure of the pressure in your arteries as the heart relaxes. What are the causes? The exact cause of this condition is not known. There are some conditions that result in high blood pressure. What increases the risk? Certain factors may make you more likely to develop high blood pressure. Some of these risk factors are under your control, including: Smoking. Not getting enough exercise or physical activity. Being overweight. Having too much fat, sugar, calories, or salt (sodium) in your diet. Drinking too much alcohol. Other risk factors include: Having a personal history of heart disease, diabetes, high cholesterol, or kidney disease. Stress. Having a family history of high blood pressure and high cholesterol. Having obstructive sleep apnea. Age. The risk increases with age. What are the signs or symptoms? High blood pressure may not cause symptoms. Very high blood pressure (hypertensive crisis) may cause: Headache. Fast or irregular heartbeats (palpitations). Shortness of breath. Nosebleed. Nausea and vomiting. Vision changes. Severe chest pain, dizziness, and seizures. How is this diagnosed? This condition is diagnosed by measuring your blood pressure while you are seated, with your arm resting on a flat surface, your legs  uncrossed, and your feet flat on the floor. The cuff of the blood pressure monitor will be placed directly against the skin of your upper arm at the level of your heart. Blood pressure should be measured at least twice using the same arm. Certain conditions can cause a difference in blood pressure between your right and left arms. If you have a high blood pressure reading during one visit or you have normal blood pressure with other risk factors, you may be asked to: Return on a different day to have your blood pressure checked again. Monitor your blood pressure at home for 1 week or longer. If you are diagnosed with hypertension, you may have other blood or imaging tests to help your health care provider understand your overall risk for other conditions. How is this treated? This condition is treated by making healthy lifestyle changes, such as eating healthy foods, exercising more, and reducing your alcohol intake. You may be referred for counseling on a healthy diet and physical activity. Your health care provider may prescribe medicine if lifestyle changes are not enough to get your blood pressure under control and if: Your systolic blood pressure is above 130. Your diastolic blood pressure is above 80. Your personal target blood pressure may vary depending on your medical conditions, your age, and other factors. Follow these instructions at home: Eating and drinking  Eat a diet that is high in fiber and potassium, and low in sodium, added sugar, and fat. An example of this eating plan is called the Farrell diet. Farrell stands for Dietary Approaches to Stop Hypertension. To eat this way: Eat plenty of fresh fruits and vegetables. Try to fill one half of your plate at each meal with fruits and vegetables. Eat whole grains, such as whole-wheat pasta, brown rice, or whole-grain bread. Fill about one fourth of your plate with whole grains. Eat or drink low-fat dairy products, such as skim milk or low-fat  yogurt. Avoid fatty cuts of meat, processed or cured meats, and poultry with skin. Fill about one fourth of your plate with lean proteins, such as fish, chicken without skin, beans, eggs, or tofu. Avoid pre-made and processed foods. These tend to be higher in sodium, added sugar, and fat. Reduce your daily sodium intake. Many people with hypertension should eat less than 1,500 mg of sodium a day. Do not drink alcohol if: Your health care provider tells you not to drink. You are pregnant, may be pregnant, or are planning to become pregnant. If you drink alcohol: Limit how much you have to: 0-1 drink a day for women. 0-2 drinks a day for men. Know how much alcohol is in your drink. In the [place] one drink equals one 12 oz bottle of beer (355 mL), one 5 oz glass of wine (148 mL), or one 1 oz glass of hard liquor (44 mL). Lifestyle  Work with your health care provider to maintain a healthy body weight or to lose weight. Ask what an ideal weight is for you. Get at least 30 minutes of exercise that causes your heart to beat faster (aerobic exercise) most days of the week. Activities may include walking, swimming, or biking. Include exercise to strengthen your muscles (resistance exercise), such as Pilates or lifting weights, as part of your weekly exercise routine. Try to do these types of exercises for 30 minutes at least 3 days a week. Do not use any products that contain nicotine or tobacco. These products include cigarettes, chewing tobacco, and vaping devices, such as e-cigarettes. If you need help quitting, ask your health care provider. Monitor your blood pressure at home as told by your health care provider. Keep all follow-up visits. This is important. Medicines Take over-the-counter and prescription medicines only as told by your health care provider. Follow directions carefully. Blood pressure medicines must be taken as prescribed. Do not skip doses of blood pressure medicine. Doing this  puts you at risk for problems and can make the medicine less effective. Ask your health care provider about side effects or reactions to medicines that you should watch for. Contact a health care provider if you: Think you are having a reaction to a medicine you are taking. Have headaches that keep coming back (recurring). Feel dizzy. Have swelling in your ankles. Have trouble with your vision. Get help right away if you: Develop a severe headache or confusion. Have unusual weakness or numbness. Feel faint. Have severe pain in your chest or abdomen. Vomit repeatedly. Have trouble breathing. These symptoms may be an emergency. Get help right away. Call 911. Do not wait to see if the symptoms will go away. Do not drive yourself to the hospital. Summary Hypertension is when the force of blood pumping through your arteries is too strong. If this condition is not controlled, it may put you at risk for serious complications. Your personal target blood pressure may  vary depending on your medical conditions, your age, and other factors. For most people, a normal blood pressure is less than 120/80. Hypertension is treated with lifestyle changes, medicines, or a combination of both. Lifestyle changes include losing weight, eating a healthy, low-sodium diet, exercising more, and limiting alcohol. This information is not intended to replace advice given to you by your health care provider. Make sure you discuss any questions you have with your health care provider. Document Revised: 01/25/2021 Document Reviewed: 01/25/2021 Elsevier Patient Education  [place], MD [facility]

## 2022-07-04 NOTE — Assessment & Plan Note (Signed)
Chronic stable condition.  Continue rosuvastatin 10 mg daily. 

## 2022-07-04 NOTE — Patient Instructions (Signed)
Hypertension, Adult High blood pressure (hypertension) is when the force of blood pumping through the arteries is too strong. The arteries are the blood vessels that carry blood from the heart throughout the body. Hypertension forces the heart to work harder to pump blood and may cause arteries to become narrow or stiff. Untreated or uncontrolled hypertension can lead to a heart attack, heart failure, a stroke, kidney disease, and other problems. A blood pressure reading consists of a higher number over a lower number. Ideally, your blood pressure should be below 120/80. The first ("top") number is called the systolic pressure. It is a measure of the pressure in your arteries as your heart beats. The second ("bottom") number is called the diastolic pressure. It is a measure of the pressure in your arteries as the heart relaxes. What are the causes? The exact cause of this condition is not known. There are some conditions that result in high blood pressure. What increases the risk? Certain factors may make you more likely to develop high blood pressure. Some of these risk factors are under your control, including: Smoking. Not getting enough exercise or physical activity. Being overweight. Having too much fat, sugar, calories, or salt (sodium) in your diet. Drinking too much alcohol. Other risk factors include: Having a personal history of heart disease, diabetes, high cholesterol, or kidney disease. Stress. Having a family history of high blood pressure and high cholesterol. Having obstructive sleep apnea. Age. The risk increases with age. What are the signs or symptoms? High blood pressure may not cause symptoms. Very high blood pressure (hypertensive crisis) may cause: Headache. Fast or irregular heartbeats (palpitations). Shortness of breath. Nosebleed. Nausea and vomiting. Vision changes. Severe chest pain, dizziness, and seizures. How is this diagnosed? This condition is diagnosed by  measuring your blood pressure while you are seated, with your arm resting on a flat surface, your legs uncrossed, and your feet flat on the floor. The cuff of the blood pressure monitor will be placed directly against the skin of your upper arm at the level of your heart. Blood pressure should be measured at least twice using the same arm. Certain conditions can cause a difference in blood pressure between your right and left arms. If you have a high blood pressure reading during one visit or you have normal blood pressure with other risk factors, you may be asked to: Return on a different day to have your blood pressure checked again. Monitor your blood pressure at home for 1 week or longer. If you are diagnosed with hypertension, you may have other blood or imaging tests to help your health care provider understand your overall risk for other conditions. How is this treated? This condition is treated by making healthy lifestyle changes, such as eating healthy foods, exercising more, and reducing your alcohol intake. You may be referred for counseling on a healthy diet and physical activity. Your health care provider may prescribe medicine if lifestyle changes are not enough to get your blood pressure under control and if: Your systolic blood pressure is above 130. Your diastolic blood pressure is above 80. Your personal target blood pressure may vary depending on your medical conditions, your age, and other factors. Follow these instructions at home: Eating and drinking  Eat a diet that is high in fiber and potassium, and low in sodium, added sugar, and fat. An example of this eating plan is called the DASH diet. DASH stands for Dietary Approaches to Stop Hypertension. To eat this way: Eat   plenty of fresh fruits and vegetables. Try to fill one half of your plate at each meal with fruits and vegetables. Eat whole grains, such as whole-wheat pasta, brown rice, or whole-grain bread. Fill about one  fourth of your plate with whole grains. Eat or drink low-fat dairy products, such as skim milk or low-fat yogurt. Avoid fatty cuts of meat, processed or cured meats, and poultry with skin. Fill about one fourth of your plate with lean proteins, such as fish, chicken without skin, beans, eggs, or tofu. Avoid pre-made and processed foods. These tend to be higher in sodium, added sugar, and fat. Reduce your daily sodium intake. Many people with hypertension should eat less than 1,500 mg of sodium a day. Do not drink alcohol if: Your health care provider tells you not to drink. You are pregnant, may be pregnant, or are planning to become pregnant. If you drink alcohol: Limit how much you have to: 0-1 drink a day for women. 0-2 drinks a day for men. Know how much alcohol is in your drink. In the [place] one drink equals one 12 oz bottle of beer (355 mL), one 5 oz glass of wine (148 mL), or one 1 oz glass of hard liquor (44 mL). Lifestyle  Work with your health care provider to maintain a healthy body weight or to lose weight. Ask what an ideal weight is for you. Get at least 30 minutes of exercise that causes your heart to beat faster (aerobic exercise) most days of the week. Activities may include walking, swimming, or biking. Include exercise to strengthen your muscles (resistance exercise), such as Pilates or lifting weights, as part of your weekly exercise routine. Try to do these types of exercises for 30 minutes at least 3 days a week. Do not use any products that contain nicotine or tobacco. These products include cigarettes, chewing tobacco, and vaping devices, such as e-cigarettes. If you need help quitting, ask your health care provider. Monitor your blood pressure at home as told by your health care provider. Keep all follow-up visits. This is important. Medicines Take over-the-counter and prescription medicines only as told by your health care provider. Follow directions carefully. Blood  pressure medicines must be taken as prescribed. Do not skip doses of blood pressure medicine. Doing this puts you at risk for problems and can make the medicine less effective. Ask your health care provider about side effects or reactions to medicines that you should watch for. Contact a health care provider if you: Think you are having a reaction to a medicine you are taking. Have headaches that keep coming back (recurring). Feel dizzy. Have swelling in your ankles. Have trouble with your vision. Get help right away if you: Develop a severe headache or confusion. Have unusual weakness or numbness. Feel faint. Have severe pain in your chest or abdomen. Vomit repeatedly. Have trouble breathing. These symptoms may be an emergency. Get help right away. Call 911. Do not wait to see if the symptoms will go away. Do not drive yourself to the hospital. Summary Hypertension is when the force of blood pumping through your arteries is too strong. If this condition is not controlled, it may put you at risk for serious complications. Your personal target blood pressure may vary depending on your medical conditions, your age, and other factors. For most people, a normal blood pressure is less than 120/80. Hypertension is treated with lifestyle changes, medicines, or a combination of both. Lifestyle changes include losing weight, eating a healthy,   low-sodium diet, exercising more, and limiting alcohol. This information is not intended to replace advice given to you by your health care provider. Make sure you discuss any questions you have with your health care provider. Document Revised: 01/25/2021 Document Reviewed: 01/25/2021 Elsevier Patient Education  2023 Elsevier Inc.  

## 2022-07-04 NOTE — Assessment & Plan Note (Signed)
Chronic and intermittent.  Not active today. Related to activities of daily living Pain management discussed Recommend to follow-up with orthopedist

## 2022-07-04 NOTE — Assessment & Plan Note (Signed)
Well-controlled hypertension Continue Diovan HCT 80-12.5 mg daily Cardiovascular risks associated with hypertension discussed Dietary approaches to stop hypertension discussed. BP Readings from Last 3 Encounters:  07/04/22 136/88  04/18/22 120/75  01/02/22 128/88

## 2022-07-04 NOTE — Assessment & Plan Note (Signed)
Chronic pain affecting quality of life Pain management discussed Recommend orthopedic evaluation Referral placed today

## 2022-07-19 NOTE — Progress Notes (Unsigned)
    Aleen Sells D.[name] [address] [place] [address] Phone: [phone]   Assessment and Plan:     There are no diagnoses linked to this encounter.  ***   Pertinent previous records reviewed include ***   Follow Up: ***     Subjective:   I, Adeel Guiffre, am serving as a Neurosurgeon for Doctor Richardean Sale  Chief Complaint: right elbow , bilat hip   HPI:  07/20/2022 Patient is a 47 year old female complaining of right elbow and bilat hip pain. Patient states  Relevant Historical Information: ***  Additional pertinent review of systems negative.   Current Outpatient Medications:    Magnesium 500 MG CAPS, Take 500 mg by mouth 4 (four) times daily., Disp: , Rfl:    omega-3 acid ethyl esters (LOVAZA) 1 g capsule, Take by mouth 2 (two) times daily., Disp: , Rfl:    Potassium Gluconate 595 MG CAPS, Take 595 mg by mouth daily., Disp: , Rfl:    Rimegepant Sulfate (NURTEC) 75 MG TBDP, Take 75 mg by mouth every other day., Disp: 15 tablet, Rfl: 12   rizatriptan (MAXALT-MLT) 10 MG disintegrating tablet, Take 1 tablet (10 mg total) by mouth as needed for migraine (may repeat x 1 tab after 2 hours; max 2 tabs per day or 8 tabs per month). May repeat in 2 hours if needed, Disp: 8 tablet, Rfl: 12   rosuvastatin (CRESTOR) 10 MG tablet, TAKE 1 TABLET BY MOUTH EVERY DAY, Disp: 90 tablet, Rfl: 3   valACYclovir (VALTREX) 500 MG tablet, Take 1 tablet (500 mg total) by mouth 2 (two) times daily., Disp: 60 tablet, Rfl: 11   valsartan-hydrochlorothiazide (DIOVAN-HCT) 80-12.5 MG tablet, TAKE 1 TABLET BY MOUTH EVERY DAY, Disp: 90 tablet, Rfl: 3   vitamin A 8000 UNIT capsule, Take 8,000 Units by mouth daily., Disp: , Rfl:    vitamin C (ASCORBIC ACID) 500 MG tablet, Take 500 mg by mouth daily., Disp: , Rfl:    zolpidem (AMBIEN) 5 MG tablet, Take 1 tablet (5 mg total) by mouth at bedtime as needed for sleep., Disp: 15 tablet, Rfl: 1   Objective:      There were no vitals filed for this visit.    There is no height or weight on file to calculate BMI.    Physical Exam:    ***   Electronically signed by:  Aleen Sells D.[name] 7:30 AM 07/19/22

## 2022-07-20 ENCOUNTER — Ambulatory Visit (INDEPENDENT_AMBULATORY_CARE_PROVIDER_SITE_OTHER): Payer: 59

## 2022-07-20 ENCOUNTER — Ambulatory Visit: Payer: 59 | Admitting: Sports Medicine

## 2022-07-20 VITALS — HR 98 | Ht 63.0 in | Wt 209.0 lb

## 2022-07-20 DIAGNOSIS — M25551 Pain in right hip: Secondary | ICD-10-CM

## 2022-07-20 DIAGNOSIS — M25522 Pain in left elbow: Secondary | ICD-10-CM

## 2022-07-20 DIAGNOSIS — M25521 Pain in right elbow: Secondary | ICD-10-CM

## 2022-07-20 DIAGNOSIS — M25552 Pain in left hip: Secondary | ICD-10-CM

## 2022-07-20 MED ORDER — MELOXICAM 15 MG PO TABS
15.0000 mg | ORAL_TABLET | Freq: Every day | ORAL | 0 refills | Status: DC
Start: 2022-07-20 — End: 2022-08-23

## 2022-07-20 NOTE — Patient Instructions (Signed)
-   Start meloxicam 15 mg daily x2 weeks.  If still having pain after 2 weeks, complete 3rd-week of meloxicam. May use remaining meloxicam as needed once daily for pain control.  Do not to use additional NSAIDs while taking meloxicam.  May use Tylenol [phone] mg 2 to 3 times a day for breakthrough pain. Elbow and glute HEP PT referral  Left wrist brace  4 week follow up

## 2022-08-14 ENCOUNTER — Other Ambulatory Visit: Payer: Self-pay

## 2022-08-14 ENCOUNTER — Encounter: Payer: Self-pay | Admitting: Physical Therapy

## 2022-08-14 ENCOUNTER — Ambulatory Visit: Payer: 59 | Attending: Sports Medicine | Admitting: Physical Therapy

## 2022-08-14 DIAGNOSIS — M25552 Pain in left hip: Secondary | ICD-10-CM | POA: Insufficient documentation

## 2022-08-14 DIAGNOSIS — M25551 Pain in right hip: Secondary | ICD-10-CM | POA: Diagnosis present

## 2022-08-14 DIAGNOSIS — M25521 Pain in right elbow: Secondary | ICD-10-CM

## 2022-08-14 DIAGNOSIS — M25522 Pain in left elbow: Secondary | ICD-10-CM | POA: Insufficient documentation

## 2022-08-14 DIAGNOSIS — M6281 Muscle weakness (generalized): Secondary | ICD-10-CM

## 2022-08-14 NOTE — Therapy (Signed)
OUTPATIENT PHYSICAL THERAPY EVALUATION   Patient Name: Angela Farrell MRN: [id number] DOB:11-24-1975, 47 y.o., female Today's Date: 08/14/2022  END OF SESSION:  PT End of Session - 08/14/22 1631     Visit Number 1    Number of Visits 17    Date for PT Re-Evaluation 10/09/22    Authorization Type [employer]    PT Start Time 1632    PT Stop Time 1725    PT Time Calculation (min) 53 min    Activity Tolerance Patient tolerated treatment well    Behavior During Therapy WFL for tasks assessed/performed             Past Medical History:  Diagnosis Date   Allergy    Anemia    Anxiety    Arthritis    Benign recurrent meningitis (mollaret) 11/18/2018   Chronic headache disorder    s/p Meningitis x 2, tension- type plus allergic/sinus problems   COVID 08/16/2020   COVID-19 05/2020   History of abnormal Pap smear    Meningitis, viral At age 52 and at age 86   Nasal fracture 2011   Other and unspecified ovarian cysts 2011- pelvic CT   GYN- Dr. Chevis Pretty   Vaginal spotting 06/04/2019   Vision abnormalities    Past Surgical History:  Procedure Laterality Date   BACK SURGERY  02/09/15   L5 - S1 diskectomy   FOOT SURGERY  s/p 2 procedures   LAPAROSCOPY FOR ECTOPIC PREGNANCY  1998   Fallopian tube removed   MOUTH SURGERY     spinal injection  03/10/15   SPINE SURGERY     UTERINE ARTERY EMBOLIZATION     Patient Active Problem List   Diagnosis Date Noted   Chronic pain of both hips 07/04/2022   Lateral epicondylitis of right elbow 07/04/2022   Insomnia 01/02/2022   Dyslipidemia 12/30/2020   History of depression 09/02/2020   Smoker 09/02/2020   Essential hypertension 09/02/2020   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   Pityriasis versicolor 08/12/2018   Depression 11/03/2014   Anxiety 01/18/2012   Chronic anemia 10/14/2011   Chronic headache disorder 04/18/2011   Obese 04/18/2011   Chronic rhinitis 04/18/2011    Class: Chronic    PCP: Georgina Quint, MD  REFERRING PROVIDER: Richardean Sale, DO  REFERRING DIAG: [phone] (ICD-10-CM) - Bilateral hip pain M25.521 (ICD-10-CM) - Right elbow pain M25.522 (ICD-10-CM) - Left elbow pain  THERAPY DIAG:  Pain of both elbows  Pain of both hip joints  Muscle weakness (generalized)  Rationale for Evaluation and Treatment: Rehabilitation  ONSET DATE: B hips for a couple of years, R elbow started last year, followed by L elbow  SUBJECTIVE:   SUBJECTIVE STATEMENT: Pt states hip pain is bothered by extension, feels like her L hip locks up sometimes. Walking tends to improve hip symptoms, especially after sitting for a while. Walks 2-3 miles per day. Standing is worse than walking. Denies n/t. Had a hip flexor injury several years ago that she received PT for.  R hand dominant. Occasional numbness into back of lateral 2 digits on RUE especially at end of day. Tender at cubital tunnel bilat. LUE has throbbing pain along lateral elbow. Difficulty gripping, especially when extended. Is having some increased difficulty with carrying objects (pitcher of water)  Pt states she is doing exercises from her ortho provider and her chiropractor.   PERTINENT HISTORY: mollaret's syndrome, HTN, anxiety/depression, smoking  PAIN:   Elbow:  Are you having pain: 6/10  Location/description: dull ache, throbbing R>L. Medial and lateral elbows Best-worst over past week: 6-10/10  - aggravating factors: sitting - Easing factors: ice, massage  Hips: Are you having pain: 6-7/10 Location/description: B hips, glutes Best-worst over past week: 6-9/10  - aggravating factors: standing, sitting, bending - Easing factors: walking, changing positions    PRECAUTIONS: none  WEIGHT BEARING RESTRICTIONS: No  FALLS:  Has patient fallen in last 6 months? Yes. Number of falls 1 fall -fell on her way to her car in a parking lot, landed on her R elbow  LIVING ENVIRONMENT: 1 story home, 3STE,  4STE Lives w/ husband  OCCUPATION: works for city, Systems developer. Hybrid remote/office  PLOF: Independent  PATIENT GOALS: reduce pain, be able to walk more with less pain.   NEXT MD VISIT: 08/23/22  OBJECTIVE:   DIAGNOSTIC FINDINGS:  BIL elbow XR 07/23/22 showing osteoarthritis (see EPIC for details) BIL hip XR 07/23/22 showing minimal degenerative changes (see EPIC for details)  PATIENT SURVEYS:  FOTO Hip: 63 current, 66 predicted FOTO elbow: 59 current, 60 predicted  COGNITION: Overall cognitive status: Within functional limits for tasks assessed     SENSATION: Light touch intact all extremities except mildly reduced RUE medial elbow     PALPATION: Concordant tenderness RUE at cubital tunnel and lateral epicondyle, LUE at proximal wrist flexor bundle   CERVICAL ROM:   Active  A/PROM  eval  Flexion   Extension   Right lateral flexion   Left lateral flexion   Right rotation   Left rotation    (Blank rows = not tested) (Key: WFL = within functional limits not formally assessed, * = concordant pain, s = stiffness/stretching sensation, NT = not tested)  Comments: Cervical ROM grossly WFL all planes although limited slightly L rotation compared to R, all movements mildly painful but do not reproduce UE symptoms    UPPER EXTREMITY ROM:  Active ROM Right eval Left eval  Shoulder flexion    Shoulder abduction    Elbow flexion [facility] WFL  Elbow extension [facility] WFL  Wrist pronation 78 deg s 82 deg s, *  Wrist supination 92 deg 90 deg  Wrist flexion 70 deg s 60 deg   Wrist extension 50 deg s 60 deg     (Blank rows = not tested)   (Key: WFL = within functional limits not formally assessed, * = concordant pain, s = stiffness/stretching sensation, NT = not tested)  Comments:     LOWER EXTREMITY ROM:     Active  Right eval Left eval  Hip flexion    Hip extension    Hip internal rotation    Hip external rotation    Knee extension    Knee flexion    (Blank rows = not  tested) (Key: WFL = within functional limits not formally assessed, * = concordant pain, s = stiffness/stretching sensation, NT = not tested)  Comments:     UPPER EXTREMITY MMT:  MMT Right eval Left eval  Shoulder flexion    Shoulder abduction    Shoulder internal rotation    Shoulder external rotation    Elbow flexion 4 4  Elbow extension 4 4  Wrist flexion 4 mild pain 4 mild pain  Wrist extension 4 mild pain 4 mild pain  Wrist pronation 4 4  Wrist supination 4 4  Grip strength     (Blank rows = not tested) Comments:   LOWER EXTREMITY MMT:    MMT Right eval Left eval  Hip flexion  Hip abduction (modified sitting)    Hip internal rotation    Hip external rotation    Knee flexion    Knee extension    Ankle dorsiflexion     (Blank rows = not tested) (Key: WFL = within functional limits not formally assessed, * = concordant pain, s = stiffness/stretching sensation, NT = not tested)  Comments:     FUNCTIONAL TESTS:  5xSTS: 16 sec no UE support   Grip strength: R 40# L 75#     TODAY'S TREATMENT:                                                                                                                              [facility] Adult PT Treatment:                                                DATE: 08/14/22 Therapeutic Exercise: Ulnar nerve glide x12 total, modified for pt comfort and significant time spent w/ explanation, monitoring symptoms and modifying accordingly Verbal HEP review of pt current program (wrist flex/ext stretches and finger abd strengthening)   PATIENT EDUCATION:  Education details: Pt education on PT impairments, prognosis, and POC. Informed consent. Rationale for interventions Person educated: Patient Education method: Explanation, Demonstration, Tactile cues, Verbal cues Education comprehension: verbalized understanding, returned demonstration, verbal cues required, tactile cues required, and needs further education    HOME EXERCISE  PROGRAM: Deferred given time constraints, verbally reviewed prior HEP  ASSESSMENT:  CLINICAL IMPRESSION: Patient is a 47 y.o. woman who was seen today for physical therapy evaluation and treatment for bilateral elbow and hip pain. Given time constraints with multiple body parts, today pt elects to focus on elbows for examination. Pt demos point tenderness at cubital tunnel and lateral epicondyle on RUE, and tenderness at proximal wrist flexor bundle on LUE. Mild reduction in LT on R UE medial elbow, reduced grip strength. Cervical screening mildly painful but not reproductive of UE symptoms. Positive R ulnar nerve testing, extensive time spent with appropriate nerve glide performance, deferred HEP for now given symptom irritability. No adverse events. Will plan to assess hips next session and update HEP/goals accordingly. Recommend skilled PT to address aforementioned deficits and maximize functional tolerance. Pt departs today's session in no acute distress, all voiced questions/concerns addressed appropriately from PT perspective.    OBJECTIVE IMPAIRMENTS: decreased activity tolerance, decreased endurance, decreased mobility, decreased ROM, decreased strength, hypomobility, impaired UE functional use, and pain.   ACTIVITY LIMITATIONS: carrying, lifting, sitting, and standing  PARTICIPATION LIMITATIONS: community activity and occupation  PERSONAL FACTORS: Time since onset of injury/illness/exacerbation and 3+ comorbidities: HTN, anxiety/depression, and multiple treatment areas  are also affecting patient's functional outcome.   REHAB POTENTIAL: Good  CLINICAL DECISION MAKING: Evolving/moderate complexity  EVALUATION COMPLEXITY: Moderate   GOALS: Goals reviewed with patient? No  SHORT TERM GOALS: Target date: 09/11/2022 Pt will demonstrate appropriate understanding and performance of initially prescribed HEP in order to facilitate improved independence with management of symptoms.   Baseline: HEP provided on eval Goal status: INITIAL   2. Pt will report at least 33% decrease in overall pain levels in past week in order to facilitate improved tolerance to basic ADLs/mobility.   Baseline: 6-9/10  Goal status: INITIAL     LONG TERM GOALS: Target date: 10/09/2022 Pt will score 66 or greater on FOTO in order to demonstrate improved perception of function due to symptoms.  Baseline: 63 Goal status: INITIAL  2.  Pt will report at least 50% decrease in overall pain levels in past week in order to facilitate improved tolerance to basic ADLs/mobility.   Baseline: 6-9/10 overall  Goal status: INITIAL    3.  Pt will demonstrate appropriate performance of final prescribed HEP in order to facilitate improved self-management of symptoms post-discharge.   Baseline: initial HEP prescribed  Goal status: INITIAL    4.  Pt will be able to perform 5xSTS in less than or equal to 12 in order to demonstrate reduced fall risk and improved functional independence (MCID 5xSTS = 2.3 sec). Baseline: 16sec Goal status: INITIAL   5. Pt will be demonstrate bilateral grip strength within 5# of unaffected limb in order to facilitate improved functional strength for daily activities.   Baseline: see functional assessment above  Goal status: INITIAL    PLAN:  PT FREQUENCY: 1-2x/week  PT DURATION: 8 weeks  PLANNED INTERVENTIONS: Therapeutic exercises, Therapeutic activity, Neuromuscular re-education, Balance training, Gait training, Patient/Family education, Self Care, Joint mobilization, Joint manipulation, Stair training, DME instructions, Aquatic Therapy, Dry Needling, Electrical stimulation, Spinal mobilization, Cryotherapy, Moist heat, Taping, Manual therapy, and Re-evaluation  PLAN FOR NEXT SESSION: Progress ROM/strengthening exercises as able/appropriate, review HEP.    Ashley Murrain PT, DPT 08/14/2022 5:27 PM

## 2022-08-17 ENCOUNTER — Ambulatory Visit: Payer: 59 | Admitting: Sports Medicine

## 2022-08-22 NOTE — Progress Notes (Unsigned)
    Angela Farrell D.[name] [address] [place] [address] Phone: [phone]   Assessment and Plan:     There are no diagnoses linked to this encounter.  ***   Pertinent previous records reviewed include ***   Follow Up: ***     Subjective:   I, Angela Farrell, am serving as a Neurosurgeon for Angela Farrell   Chief Complaint: right elbow , bilat hip    HPI:  07/20/2022 Patient is a 47 year old female complaining of right elbow and bilat hip pain. Patient states tht she has had hip pain for a year, went to [facility] , pt , and chiro, she states she gets kinked up chiro told her , her pain was coming from her back , hx of back surgery, states she walks a lot , the pain will ease up if she walks a lot    Elbow pain bilat has numbness and tingling going down both , no MOI, pain going on for a while she was given a shot and that helped , she works on a computed raily but she has purhcased things that will help ergonomically , decreased grip strength , pain radiates up and down her arm   08/23/2022 Patient states   Relevant Historical Information: Hypertension  Additional pertinent review of systems negative.   Current Outpatient Medications:    Magnesium 500 MG CAPS, Take 500 mg by mouth 4 (four) times daily., Disp: , Rfl:    meloxicam (MOBIC) 15 MG tablet, Take 1 tablet (15 mg total) by mouth daily., Disp: 30 tablet, Rfl: 0   omega-3 acid ethyl esters (LOVAZA) 1 g capsule, Take by mouth 2 (two) times daily., Disp: , Rfl:    Potassium Gluconate 595 MG CAPS, Take 595 mg by mouth daily., Disp: , Rfl:    Rimegepant Sulfate (NURTEC) 75 MG TBDP, Take 75 mg by mouth every other day., Disp: 15 tablet, Rfl: 12   rizatriptan (MAXALT-MLT) 10 MG disintegrating tablet, Take 1 tablet (10 mg total) by mouth as needed for migraine (may repeat x 1 tab after 2 hours; max 2 tabs per day or 8 tabs per month). May repeat in 2 hours if needed, Disp:  8 tablet, Rfl: 12   rosuvastatin (CRESTOR) 10 MG tablet, TAKE 1 TABLET BY MOUTH EVERY DAY, Disp: 90 tablet, Rfl: 3   valACYclovir (VALTREX) 500 MG tablet, Take 1 tablet (500 mg total) by mouth 2 (two) times daily., Disp: 60 tablet, Rfl: 11   valsartan-hydrochlorothiazide (DIOVAN-HCT) 80-12.5 MG tablet, TAKE 1 TABLET BY MOUTH EVERY DAY, Disp: 90 tablet, Rfl: 3   vitamin A 8000 UNIT capsule, Take 8,000 Units by mouth daily., Disp: , Rfl:    vitamin C (ASCORBIC ACID) 500 MG tablet, Take 500 mg by mouth daily., Disp: , Rfl:    zolpidem (AMBIEN) 5 MG tablet, Take 1 tablet (5 mg total) by mouth at bedtime as needed for sleep., Disp: 15 tablet, Rfl: 1   Objective:     There were no vitals filed for this visit.    There is no height or weight on file to calculate BMI.    Physical Exam:    ***   Electronically signed by:  Angela Farrell D.[name] 3:42 PM 08/22/22

## 2022-08-23 ENCOUNTER — Ambulatory Visit: Payer: 59 | Admitting: Sports Medicine

## 2022-08-23 VITALS — HR 104 | Ht 63.0 in | Wt 202.0 lb

## 2022-08-23 DIAGNOSIS — M25551 Pain in right hip: Secondary | ICD-10-CM | POA: Diagnosis not present

## 2022-08-23 DIAGNOSIS — M25552 Pain in left hip: Secondary | ICD-10-CM | POA: Diagnosis not present

## 2022-08-23 DIAGNOSIS — M2550 Pain in unspecified joint: Secondary | ICD-10-CM

## 2022-08-23 DIAGNOSIS — M25522 Pain in left elbow: Secondary | ICD-10-CM | POA: Diagnosis not present

## 2022-08-23 DIAGNOSIS — M25521 Pain in right elbow: Secondary | ICD-10-CM | POA: Diagnosis not present

## 2022-08-23 LAB — CBC WITH DIFFERENTIAL/PLATELET
Basophils Absolute: 0.1 10*3/uL (ref 0.0–0.1)
Basophils Relative: 1 % (ref 0.0–3.0)
Eosinophils Absolute: 0.1 10*3/uL (ref 0.0–0.7)
Eosinophils Relative: 1.1 % (ref 0.0–5.0)
HCT: 36.1 % (ref 36.0–46.0)
Hemoglobin: 11.3 g/dL — ABNORMAL LOW (ref 12.0–15.0)
Lymphocytes Relative: 48.1 % — ABNORMAL HIGH (ref 12.0–46.0)
Lymphs Abs: 4.2 10*3/uL — ABNORMAL HIGH (ref 0.7–4.0)
MCHC: 31.4 g/dL (ref 30.0–36.0)
MCV: 91.6 fl (ref 78.0–100.0)
Monocytes Absolute: 0.6 10*3/uL (ref 0.1–1.0)
Monocytes Relative: 6.5 % (ref 3.0–12.0)
Neutro Abs: 3.8 10*3/uL (ref 1.4–7.7)
Neutrophils Relative %: 43.3 % (ref 43.0–77.0)
Platelets: 415 10*3/uL — ABNORMAL HIGH (ref 150.0–400.0)
RBC: 3.94 Mil/uL (ref 3.87–5.11)
RDW: 13.1 % (ref 11.5–15.5)
WBC: 8.7 10*3/uL (ref 4.0–10.5)

## 2022-08-23 LAB — TSH: TSH: 0.76 u[IU]/mL (ref 0.35–5.50)

## 2022-08-23 LAB — COMPREHENSIVE METABOLIC PANEL
ALT: 32 U/L (ref 0–35)
AST: 25 U/L (ref 0–37)
Albumin: 4.4 g/dL (ref 3.5–5.2)
Alkaline Phosphatase: 48 U/L (ref 39–117)
BUN: 14 mg/dL (ref 6–23)
CO2: 25 mEq/L (ref 19–32)
Calcium: 9.1 mg/dL (ref 8.4–10.5)
Chloride: 103 mEq/L (ref 96–112)
Creatinine, Ser: 0.69 mg/dL (ref 0.40–1.20)
GFR: 102.38 mL/min (ref >60.00)
Glucose, Bld: 109 mg/dL — ABNORMAL HIGH (ref 70–99)
Potassium: 3.7 mEq/L (ref 3.5–5.1)
Sodium: 136 mEq/L (ref 135–145)
Total Bilirubin: 0.8 mg/dL (ref 0.2–1.2)
Total Protein: 7.2 g/dL (ref 6.0–8.3)

## 2022-08-23 LAB — FERRITIN: Ferritin: 113.1 ng/mL (ref 10.0–291.0)

## 2022-08-23 LAB — SEDIMENTATION RATE: Sed Rate: 15 mm/hr (ref 0–20)

## 2022-08-23 LAB — URIC ACID: Uric Acid, Serum: 3.7 mg/dL (ref 2.4–7.0)

## 2022-08-23 LAB — C-REACTIVE PROTEIN: CRP: <1 mg/dL (ref 0.5–20.0)

## 2022-08-23 LAB — VITAMIN D 25 HYDROXY (VIT D DEFICIENCY, FRACTURES): VITD: 46.67 ng/mL (ref 30.00–100.00)

## 2022-08-23 MED ORDER — METHYLPREDNISOLONE 4 MG PO TBPK
ORAL_TABLET | ORAL | 0 refills | Status: DC
Start: 2022-08-23 — End: 2022-11-08

## 2022-08-23 NOTE — Patient Instructions (Signed)
Labs on the way out  Prednisone dos pak  3 week follow up

## 2022-08-27 LAB — RHEUMATOID FACTOR: Rheumatoid fact SerPl-aCnc: <10 IU/mL (ref <14)

## 2022-08-27 LAB — ANA: Anti Nuclear Antibody (ANA): POSITIVE — AB

## 2022-08-27 LAB — ANTI-NUCLEAR AB-TITER (ANA TITER): ANA Titer 1: 1:40 {titer} — ABNORMAL HIGH

## 2022-08-27 LAB — CYCLIC CITRUL PEPTIDE ANTIBODY, IGG: Cyclic Citrullin Peptide Ab: <16 UNITS

## 2022-08-29 ENCOUNTER — Other Ambulatory Visit: Payer: Self-pay | Admitting: Sports Medicine

## 2022-08-29 DIAGNOSIS — R7689 Other specified abnormal immunological findings in serum: Secondary | ICD-10-CM

## 2022-08-29 DIAGNOSIS — R768 Other specified abnormal immunological findings in serum: Secondary | ICD-10-CM

## 2022-08-29 DIAGNOSIS — M2550 Pain in unspecified joint: Secondary | ICD-10-CM

## 2022-08-29 NOTE — Progress Notes (Unsigned)
positive ANA and polyarthralgia referral has been placed

## 2022-08-29 NOTE — Therapy (Addendum)
OUTPATIENT PHYSICAL THERAPY TREATMENT NOTE + NO VISIT DISCHARGE SUMMARY (see below)    Patient Name: Angela Farrell MRN: [id number] DOB:1976-01-02, 47 y.o., female Today's Date: 08/30/2022  END OF SESSION:  PT End of Session - 08/30/22 1633     Visit Number 2    Number of Visits 17    Date for PT Re-Evaluation 10/09/22    Authorization Type [employer]    PT Start Time 1633    PT Stop Time 1712    PT Time Calculation (min) 39 min    Activity Tolerance Patient tolerated treatment well    Behavior During Therapy WFL for tasks assessed/performed              Past Medical History:  Diagnosis Date   Allergy    Anemia    Anxiety    Arthritis    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 Chevis Pretty   Vaginal spotting 06/04/2019   Vision abnormalities    Past Surgical History:  Procedure Laterality Date   BACK SURGERY  02/09/15   L5 - S1 diskectomy   FOOT SURGERY  s/p 2 procedures   LAPAROSCOPY FOR ECTOPIC PREGNANCY  1998   Fallopian tube removed   MOUTH SURGERY     spinal injection  03/10/15   SPINE SURGERY     UTERINE ARTERY EMBOLIZATION     Patient Active Problem List   Diagnosis Date Noted   Chronic pain of both hips 07/04/2022   Lateral epicondylitis of right elbow 07/04/2022   Insomnia 01/02/2022   Dyslipidemia 12/30/2020   History of depression 09/02/2020   Smoker 09/02/2020   Essential hypertension 09/02/2020   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   Pityriasis versicolor 08/12/2018   Depression 11/03/2014   Anxiety 01/18/2012   Chronic anemia 10/14/2011   Chronic headache disorder 04/18/2011   Obese 04/18/2011   Chronic rhinitis  04/18/2011    Class: Chronic    PCP: Georgina Quint, MD  REFERRING PROVIDER: Richardean Sale, DO  REFERRING DIAG: [phone] (ICD-10-CM) - Bilateral hip pain M25.521 (ICD-10-CM) - Right elbow pain M25.522 (ICD-10-CM) - Left elbow pain  THERAPY DIAG:  Pain of both elbows  Pain of both hip joints  Muscle weakness (generalized)  Rationale for Evaluation and Treatment: Rehabilitation  ONSET DATE: B hips for a couple of years, R elbow started last year, followed by L elbow  SUBJECTIVE:  Per eval - Pt states hip pain is bothered by extension, feels like her L hip locks up sometimes. Walking tends to improve hip symptoms, especially after sitting for a while. Walks 2-3 miles per day. Standing is worse than walking. Denies n/t. Had a hip flexor injury several years ago that she received PT for.  R hand dominant. Occasional numbness into back of lateral 2 digits on RUE especially at end of day. Tender at cubital tunnel bilat. LUE has throbbing pain along lateral elbow. Difficulty gripping, especially when extended. Is having some increased difficulty with carrying objects (pitcher of water) due to pain with gripping RUE Pt states she is doing exercises from her ortho provider and her chiropractor.   SUBJECTIVE STATEMENT: 08/30/2022  Pt arrives w/ primary report of L hip pain, notes that she had a recent injection in elbow that seems to have improved things. She reports that she had some labs done and is being referred to a rheumatologist, states she would like to proceed with hip assessment and HEP but wants to place PT on hold until after she has had workup    PERTINENT HISTORY: mollaret's syndrome, HTN, anxiety/depression, smoking  PAIN:   Elbow:  Are you having pain: 1/10 Location/description: dull ache, throbbing R>L. Medial and lateral elbows  Per eval -  Best-worst over past week: 6-10/10  - aggravating factors: sitting - Easing factors: ice, massage  Hips: Are  you having pain: 9-10/10 Location/description: B hips, glutes, L>R  Per eval -  Best-worst over past week: 6-9/10  - aggravating factors: standing, sitting, bending - Easing factors: walking, changing positions    PRECAUTIONS: none  WEIGHT BEARING RESTRICTIONS: No  FALLS:  Has patient fallen in last 6 months? Yes. Number of falls 1 fall -fell on her way to her car in a parking lot, landed on her R elbow  LIVING ENVIRONMENT: 1 story home, 3STE, 4STE Lives w/ husband  OCCUPATION: works for city, Systems developer. Hybrid remote/office  PLOF: Independent  PATIENT GOALS: reduce pain, be able to walk more with less pain.   NEXT MD VISIT: 08/23/22  OBJECTIVE: (objective measures completed at initial evaluation unless otherwise dated)   DIAGNOSTIC FINDINGS:  BIL elbow XR 07/23/22 showing osteoarthritis (see EPIC for details) BIL hip XR 07/23/22 showing minimal degenerative changes (see EPIC for details)  PATIENT SURVEYS:  FOTO Hip: 63 current, 66 predicted FOTO elbow: 59 current, 60 predicted  COGNITION: Overall cognitive status: Within functional limits for tasks assessed     SENSATION: Light touch intact all extremities except mildly reduced RUE medial elbow     PALPATION: Concordant tenderness RUE at cubital tunnel and lateral epicondyle, LUE at proximal wrist flexor bundle   CERVICAL ROM:   Active  A/PROM  eval  Flexion   Extension   Right lateral flexion   Left lateral flexion   Right rotation   Left rotation    (Blank rows = not tested) (Key: WFL = within functional limits not formally assessed, * = concordant pain, s = stiffness/stretching sensation, NT = not tested)  Comments: Cervical ROM grossly WFL all planes although limited slightly L rotation compared to R, all movements mildly painful but do not reproduce UE symptoms    UPPER EXTREMITY ROM:  Active ROM Right eval Left eval  Shoulder flexion    Shoulder abduction    Elbow flexion [facility] WFL  Elbow  extension [facility] WFL  Wrist pronation 78 deg s 82 deg s, *  Wrist supination 92 deg 90 deg  Wrist flexion 70 deg s 60 deg   Wrist extension 50 deg s 60 deg     (Blank rows = not tested)   (Key: WFL = within functional limits not formally assessed, * = concordant pain, s = stiffness/stretching sensation, NT = not tested)  Comments:     LOWER EXTREMITY ROM:     Active  Right eval Left eval  Hip flexion    Hip extension    Hip internal rotation    Hip external rotation    Knee extension    Knee flexion    (Blank rows = not tested) (Key: WFL = within functional limits not formally assessed, * = concordant pain, s = stiffness/stretching sensation, NT = not tested)  Comments:     UPPER EXTREMITY MMT:  MMT Right eval Left eval  Shoulder flexion    Shoulder abduction    Shoulder internal rotation    Shoulder external rotation    Elbow flexion 4 4  Elbow extension 4 4  Wrist flexion 4 mild pain 4 mild pain  Wrist extension 4 mild pain 4 mild pain  Wrist pronation 4 4  Wrist supination 4 4  Grip strength     (Blank rows = not tested) Comments:   LOWER EXTREMITY MMT:    MMT Right 08/29/22 Left 08/29/22  Hip flexion 4 4   Hip abduction (modified sitting) 5 5  Hip internal rotation 4- * 4- *  Hip external rotation 4(L sided pain) 4  Knee flexion 5 5 (feels subjectively weaker per pt)  Knee extension 4+ 4+  Ankle dorsiflexion 5 5   (Blank rows = not tested) (Key: WFL = within functional limits not formally assessed, * = concordant pain, s = stiffness/stretching sensation, NT = not tested)  Comments:     FUNCTIONAL TESTS:  5xSTS: 16 sec no UE support   Grip strength: R 40# L 75#   HIP EXAM 08/30/22: LLE - - FADDIR, + FABER (anterior), mild relief from distraction and compression RLE - - FADDIR, + FABER post, mild relief from distraction, mild aggravation compression    TODAY'S TREATMENT:                                                                                                                               [facility] Adult PT Treatment:                                                DATE: 08/30/22 Therapeutic Exercise: Seated RTB ER  x10, cues for setup and comfortable ROM Seated RTB IR x10, cues for setup and comfortable ROM HEP handout + education  Therapeutic Activity: MSK assessment + education Education on exam findings as they relate to symptom behavior and functional tolerance, relevant anatomy/physiology as they relate to function Education on options w/ PT going forward as pt states she would like to hold on PT, education on activity modification, monitoring symptoms and accommodating activity accordingly, benefits of continuing walking program  [facility] Adult PT Treatment:                                                DATE: 08/14/22 Therapeutic Exercise: Ulnar nerve glide x12 total, modified for pt comfort and significant time spent w/ explanation, monitoring symptoms and modifying accordingly Verbal HEP review of pt current program (wrist flex/ext stretches and finger abd strengthening)   PATIENT EDUCATION:  Education details: Pt  education on PT impairments, prognosis, and POC. Informed consent. Rationale for interventions, HEP, followup with provider Person educated: Patient Education method: Explanation, Demonstration, Tactile cues, Verbal cues Education comprehension: verbalized understanding, returned demonstration, verbal cues required, tactile cues required, and needs further education    HOME EXERCISE PROGRAM: Access Code: [email or web address] URL: [email or web address] Date: 08/30/2022 Prepared by: Fransisco Hertz  Program Notes - do external rotation with similar setup to internal rotation as below  Exercises - Seated Hip Internal Rotation with Ball and Resistance  - 1 x daily - 7 x weekly - 3 sets - 10 reps - Seated Hip External Rotation with Resistance  - 1 x daily - 7 x weekly - 3 sets - 10 reps  ASSESSMENT:  CLINICAL  IMPRESSION: 08/30/2022 Pt arrives w/ report of L hip pain. Pt states she is being referred to rheumatology and would like to hold on PT until she has finished their work up, although she does request that we proceed with hip exam and HEP today which is respected. See above for findings, noted concordant discomfort and weakness with rotational musculature of hips. Tolerates HEP well without issue, education on activity as above. Per discussion with pt, will place PT on temporary hold at this time. Pt verbalizes agreement/understanding of plan, departs without adverse event and denies any questions/concerns.    Per eval - Patient is a pleasant 47 y.o. woman who was seen today for physical therapy evaluation and treatment for bilateral elbow and hip pain. Given time constraints with multiple body parts, today pt elects to focus on elbows for examination. Pt demos point tenderness at cubital tunnel and lateral epicondyle on RUE, and tenderness at proximal wrist flexor bundle on LUE. Mild reduction in LT on R UE medial elbow, reduced grip strength. Cervical screening mildly painful but not reproductive of UE symptoms. Positive R ulnar nerve testing, extensive time spent with appropriate nerve glide performance, deferred HEP for now given symptom irritability and time constraints. No adverse events. Will plan to assess hips next session and update HEP/goals accordingly. Recommend skilled PT to address aforementioned deficits and maximize functional tolerance. Pt departs today's session in no acute distress, all voiced questions/concerns addressed appropriately from PT perspective.    OBJECTIVE IMPAIRMENTS: decreased activity tolerance, decreased endurance, decreased mobility, decreased ROM, decreased strength, hypomobility, impaired UE functional use, and pain.   ACTIVITY LIMITATIONS: carrying, lifting, sitting, and standing  PARTICIPATION LIMITATIONS: community activity and occupation  PERSONAL FACTORS: Time  since onset of injury/illness/exacerbation and 3+ comorbidities: HTN, anxiety/depression, and multiple treatment areas  are also affecting patient's functional outcome.   REHAB POTENTIAL: Good  CLINICAL DECISION MAKING: Evolving/moderate complexity  EVALUATION COMPLEXITY: Moderate   GOALS: Goals reviewed with patient? No  SHORT TERM GOALS: Target date: 09/11/2022 Pt will demonstrate appropriate understanding and performance of initially prescribed HEP in order to facilitate improved independence with management of symptoms.  Baseline: HEP provided on eval Goal status: INITIAL   2. Pt will report at least 33% decrease in overall pain levels in past week in order to facilitate improved tolerance to basic ADLs/mobility.   Baseline: 6-9/10  Goal status: INITIAL     LONG TERM GOALS: Target date: 10/09/2022 Pt will score 66 or greater on FOTO in order to demonstrate improved perception of function due to symptoms.  Baseline: 63 Goal status: INITIAL  2.  Pt will report at least 50% decrease in overall pain levels in past week in order to facilitate improved tolerance to basic ADLs/mobility.   Baseline:  6-9/10 overall  Goal status: INITIAL    3.  Pt will demonstrate appropriate performance of final prescribed HEP in order to facilitate improved self-management of symptoms post-discharge.   Baseline: initial HEP prescribed  Goal status: INITIAL    4.  Pt will be able to perform 5xSTS in less than or equal to 12 sec without UE support in order to demonstrate reduced fall risk and improved functional independence (MCID 5xSTS = 2.3 sec). Baseline: 16sec with UE support Goal status: INITIAL   5. Pt will be demonstrate bilateral grip strength within 5# of unaffected limb in order to facilitate improved functional strength for daily activities.   Baseline: see functional assessment above  Goal status: INITIAL    PLAN:  PT FREQUENCY: 1-2x/week  PT DURATION: 8 weeks  PLANNED  INTERVENTIONS: Therapeutic exercises, Therapeutic activity, Neuromuscular re-education, Balance training, Gait training, Patient/Family education, Self Care, Joint mobilization, Joint manipulation, Stair training, DME instructions, Aquatic Therapy, Dry Needling, Electrical stimulation, Spinal mobilization, Cryotherapy, Moist heat, Taping, Manual therapy, and Re-evaluation  PLAN FOR NEXT SESSION: temporary hold per pt request given rheumatological workup. Re-assess, update POC as able/appropriate   Ashley Murrain PT, DPT 08/31/2022 8:33 AM    Discharge addendum:    PHYSICAL THERAPY DISCHARGE SUMMARY  Visits from Start of Care: 2  Current functional level related to goals / functional outcomes: Unable to assess   Remaining deficits: Unable to assess   Education / Equipment: Unable to assess   Patient goals were  unable to be assessed . Patient is being discharged due to not returning since the last visit.    Ashley Murrain PT, DPT 10/19/2022 3:34 PM

## 2022-08-30 ENCOUNTER — Encounter: Payer: Self-pay | Admitting: Physical Therapy

## 2022-08-30 ENCOUNTER — Ambulatory Visit: Payer: 59 | Admitting: Physical Therapy

## 2022-08-30 DIAGNOSIS — M6281 Muscle weakness (generalized): Secondary | ICD-10-CM

## 2022-08-30 DIAGNOSIS — M25551 Pain in right hip: Secondary | ICD-10-CM

## 2022-08-30 DIAGNOSIS — M25521 Pain in right elbow: Secondary | ICD-10-CM | POA: Diagnosis not present

## 2022-08-30 NOTE — Progress Notes (Signed)
Aleen Sells D.[name] [address] [place] [address] Phone: [phone]   Assessment and Plan:     1. Positive ANA (antinuclear antibody) 2. Polyarthralgia 3. Bilateral hip pain 4. Right elbow pain 5. Left elbow pain  -Chronic with exacerbation, subsequent visit - Overall no improvement despite HEP, meloxicam course, prednisone course.  Still unclear etiology of patient's broad musculoskeletal complaints with thus far primarily unremarkable workup.  Patient does have degenerative changes seen in lumbar and cervical spine from MRIs, however no significant findings on hip x-ray or bilateral elbow x-rays.  Patient does have mildly elevated 1: 40 ANA, however inflammatory markers were all within normal limits including sed rate, CRP, ferritin levels, so this may not represent an active disease process.  I still recommend patient establish care with rheumatology to see if they would recommend any more advanced lab work - Differential includes fibromyalgia.  Discussed trialing gabapentin versus duloxetine and patient elected to trial duloxetine - Start duloxetine 30 mg daily for the next 4 weeks -Recommend calling back physical therapy to restart visits  Patient accompanied by her husband throughout exam  Pertinent previous records reviewed include lab work from 08/23/2022   Follow Up: 4 weeks for reevaluation.  Could discuss continuing versus increasing duloxetine medication.   Subjective:   I, [name], am serving as a Neurosurgeon for Doctor Richardean Sale   Chief Complaint: right elbow , bilat hip    HPI:  07/20/2022 Patient is a 47 year old female complaining of right elbow and bilat hip pain. Patient states tht she has had hip pain for a year, went to [facility] , pt , and chiro, she states she gets kinked up chiro told her , her pain was coming from her back , hx of back surgery, states she walks a lot , the pain will ease up if  she walks a lot    Elbow pain bilat has numbness and tingling going down both , no MOI, pain going on for a while she was given a shot and that helped , she works on a computed raily but she has purhcased things that will help ergonomically , decreased grip strength , pain radiates up and down her arm    08/23/2022 Patient states she is seeing murphy wainer and received a CSI bilat  elbow but she is still TTP , they want her to get a nerve conduction study , she is still in pain all over    09/06/2022 Patient states that she has good days and bad days mostly bad days, still gets achy pain in forearms and elbow and hips are always in pain   Relevant Historical Information: Hypertension  Additional pertinent review of systems negative.   Current Outpatient Medications:    DULoxetine (CYMBALTA) 30 MG capsule, Take 1 capsule (30 mg total) by mouth daily., Disp: 30 capsule, Rfl: 0   Magnesium 500 MG CAPS, Take 500 mg by mouth 4 (four) times daily., Disp: , Rfl:    omega-3 acid ethyl esters (LOVAZA) 1 g capsule, Take by mouth 2 (two) times daily., Disp: , Rfl:    Potassium Gluconate 595 MG CAPS, Take 595 mg by mouth daily., Disp: , Rfl:    Rimegepant Sulfate (NURTEC) 75 MG TBDP, Take 75 mg by mouth every other day., Disp: 15 tablet, Rfl: 12   rizatriptan (MAXALT-MLT) 10 MG disintegrating tablet, Take 1 tablet (10 mg total) by mouth as needed for migraine (may repeat x  1 tab after 2 hours; max 2 tabs per day or 8 tabs per month). May repeat in 2 hours if needed, Disp: 8 tablet, Rfl: 12   rosuvastatin (CRESTOR) 10 MG tablet, TAKE 1 TABLET BY MOUTH EVERY DAY, Disp: 90 tablet, Rfl: 3   valACYclovir (VALTREX) 500 MG tablet, Take 1 tablet (500 mg total) by mouth 2 (two) times daily., Disp: 60 tablet, Rfl: 11   valsartan-hydrochlorothiazide (DIOVAN-HCT) 80-12.5 MG tablet, TAKE 1 TABLET BY MOUTH EVERY DAY, Disp: 90 tablet, Rfl: 3   vitamin A 8000 UNIT capsule, Take 8,000 Units by mouth daily., Disp: , Rfl:     vitamin C (ASCORBIC ACID) 500 MG tablet, Take 500 mg by mouth daily., Disp: , Rfl:    methylPREDNISolone (MEDROL DOSEPAK) 4 MG TBPK tablet, Take 6 tablets on day 1.  Take 5 tablets on day 2.  Take 4 tablets on day 3.  Take 3 tablets on day 4.  Take 2 tablets on day 5.  Take 1 tablet on day 6., Disp: 21 tablet, Rfl: 0   Objective:     Vitals:   09/06/22 1003  BP: 122/82  Pulse: 93  SpO2: 98%  Weight: 205 lb (93 kg)  Height: 5\' 3"  (1.6 m)      Body mass index is 36.31 kg/m.    Physical Exam:    General: awake, alert, and oriented no acute distress, nontoxic Skin: no suspicious lesions or rashes Neuro:sensation intact distally with no deficits, normal muscle tone, no atrophy, strength 5/5 in all tested lower ext groups Psych: normal mood and affect, speech clear   Bilateral hip: No deformity, swelling or wasting ROM Flexion 90, ext 30, IR 45, ER 45 TTP gluteal musculature and greater trochanter NTTP over the hip flexors, si joint, lumbar spine Negative log roll with FROM Negative FABER Negative FADIR Negative Piriformis test for radicular symptoms, though reproduced gluteal tension bilaterally Negative trendelenberg Gait normal       Electronically signed by:  Aleen Sells D.[name] 10:40 AM 09/06/22

## 2022-09-05 ENCOUNTER — Ambulatory Visit: Payer: 59 | Admitting: Physical Therapy

## 2022-09-06 ENCOUNTER — Encounter: Payer: Self-pay | Admitting: Physical Therapy

## 2022-09-06 ENCOUNTER — Ambulatory Visit: Payer: 59 | Admitting: Sports Medicine

## 2022-09-06 VITALS — BP 122/82 | HR 93 | Ht 63.0 in | Wt 205.0 lb

## 2022-09-06 DIAGNOSIS — R768 Other specified abnormal immunological findings in serum: Secondary | ICD-10-CM | POA: Diagnosis not present

## 2022-09-06 DIAGNOSIS — M25521 Pain in right elbow: Secondary | ICD-10-CM

## 2022-09-06 DIAGNOSIS — M25551 Pain in right hip: Secondary | ICD-10-CM | POA: Diagnosis not present

## 2022-09-06 DIAGNOSIS — M25552 Pain in left hip: Secondary | ICD-10-CM

## 2022-09-06 DIAGNOSIS — M2550 Pain in unspecified joint: Secondary | ICD-10-CM

## 2022-09-06 DIAGNOSIS — R7689 Other specified abnormal immunological findings in serum: Secondary | ICD-10-CM

## 2022-09-06 DIAGNOSIS — M25522 Pain in left elbow: Secondary | ICD-10-CM

## 2022-09-06 MED ORDER — DULOXETINE HCL 30 MG PO CPEP: 30.0000 mg | ORAL_CAPSULE | Freq: Every day | ORAL | 0 refills | Status: DC

## 2022-09-06 NOTE — Patient Instructions (Signed)
Duloxetine 30 mg daily  Call PT to re establish care 4 week follow up

## 2022-09-07 ENCOUNTER — Other Ambulatory Visit: Payer: Self-pay | Admitting: Infectious Disease

## 2022-09-07 NOTE — Telephone Encounter (Signed)
Ok to refill? She does not have her one year f/u schedule with you.

## 2022-09-07 NOTE — Telephone Encounter (Signed)
Patient is scheduled to see you on 8/7 and she stated that she does not need refills at this time, she has extra bottles.

## 2022-09-07 NOTE — Telephone Encounter (Signed)
Do you want me send in a years worth of refills or couple of months?

## 2022-09-11 ENCOUNTER — Encounter: Payer: Self-pay | Admitting: Physical Therapy

## 2022-09-13 ENCOUNTER — Ambulatory Visit: Payer: 59 | Admitting: Sports Medicine

## 2022-09-25 ENCOUNTER — Encounter: Payer: 59 | Admitting: Physical Therapy

## 2022-09-25 NOTE — Progress Notes (Signed)
Angela Farrell D.[name] [address] [place] [address] Phone: [phone]   Assessment and Plan:     1. Fibromyalgia 2. Positive ANA (antinuclear antibody) 3. Polyarthralgia  -Chronic with exacerbation, improving, subsequent visit - Symptoms most consistent with fibromyalgia.  Overall improvement when patient started duloxetine 30 mg daily.  Patient ran out of medication within the last week and has noticed that pain has returned.  This would be consistent with fibromyalgia - I do not have records of patient's rheumatology visit, though she states that further workup was done and was unremarkable. - Recommend restarting duloxetine 30 mg daily - May continue physical therapy  Pertinent previous records reviewed include none   Follow Up: 4 to 8 weeks for reevaluation.  If improving, would continue duloxetine 30 mg daily as chronic medication.  If no improvement or worsening of symptoms, could consider increasing dose.   Subjective:   I, Angela Farrell, am serving as a Neurosurgeon for Doctor Richardean Sale   Chief Complaint: right elbow , bilat hip    HPI:  07/20/2022 Patient is a 47 year old female complaining of right elbow and bilat hip pain. Patient states tht she has had hip pain for a year, went to [facility] , pt , and chiro, she states she gets kinked up chiro told her , her pain was coming from her back , hx of back surgery, states she walks a lot , the pain will ease up if she walks a lot    Elbow pain bilat has numbness and tingling going down both , no MOI, pain going on for a while she was given a shot and that helped , she works on a computed raily but she has purhcased things that will help ergonomically , decreased grip strength , pain radiates up and down her arm    08/23/2022 Patient states she is seeing murphy wainer and received a CSI bilat  elbow but she is still TTP , they want her to get a nerve conduction study ,  she is still in pain all over    09/06/2022 Patient states that she has good days and bad days mostly bad days, still gets achy pain in forearms and elbow and hips are always in pain   10/02/2022 Patient states meds helped a lot and now the pain has come back     Relevant Historical Information: Hypertension   Additional pertinent review of systems negative.   Current Outpatient Medications:    Magnesium 500 MG CAPS, Take 500 mg by mouth 4 (four) times daily., Disp: , Rfl:    omega-3 acid ethyl esters (LOVAZA) 1 g capsule, Take by mouth 2 (two) times daily., Disp: , Rfl:    Potassium Gluconate 595 MG CAPS, Take 595 mg by mouth daily., Disp: , Rfl:    Rimegepant Sulfate (NURTEC) 75 MG TBDP, Take 75 mg by mouth every other day., Disp: 15 tablet, Rfl: 12   rizatriptan (MAXALT-MLT) 10 MG disintegrating tablet, Take 1 tablet (10 mg total) by mouth as needed for migraine (may repeat x 1 tab after 2 hours; max 2 tabs per day or 8 tabs per month). May repeat in 2 hours if needed, Disp: 8 tablet, Rfl: 12   rosuvastatin (CRESTOR) 10 MG tablet, TAKE 1 TABLET BY MOUTH EVERY DAY, Disp: 90 tablet, Rfl: 3   valACYclovir (VALTREX) 500 MG tablet, Take 1 tablet (500 mg total) by mouth 2 (two) times daily., Disp: 60 tablet,  Rfl: 11   valsartan-hydrochlorothiazide (DIOVAN-HCT) 80-12.5 MG tablet, TAKE 1 TABLET BY MOUTH EVERY DAY, Disp: 90 tablet, Rfl: 3   vitamin A 8000 UNIT capsule, Take 8,000 Units by mouth daily., Disp: , Rfl:    vitamin C (ASCORBIC ACID) 500 MG tablet, Take 500 mg by mouth daily., Disp: , Rfl:    DULoxetine (CYMBALTA) 30 MG capsule, TAKE 1 CAPSULE BY MOUTH EVERY DAY, Disp: 90 capsule, Rfl: 0   methylPREDNISolone (MEDROL DOSEPAK) 4 MG TBPK tablet, Take 6 tablets on day 1.  Take 5 tablets on day 2.  Take 4 tablets on day 3.  Take 3 tablets on day 4.  Take 2 tablets on day 5.  Take 1 tablet on day 6., Disp: 21 tablet, Rfl: 0   Objective:     Vitals:   10/02/22 1545  BP: 132/78  Pulse: (!)  102  SpO2: 97%  Weight: 205 lb (93 kg)  Height: 5\' 3"  (1.6 m)      Body mass index is 36.31 kg/m.    Physical Exam:    General: awake, alert, and oriented no acute distress, nontoxic Skin: no suspicious lesions or rashes Neuro:sensation intact distally with no deficits, normal muscle tone, no atrophy, strength 5/5 in all tested lower ext groups Psych: normal mood and affect, speech clear   Bilateral hip: No deformity, swelling or wasting ROM Flexion 90, ext 30, IR 45, ER 45 TTP gluteal musculature and greater trochanter NTTP over the hip flexors, si joint, lumbar spine Negative log roll with FROM Negative FABER Negative FADIR Negative Piriformis test for radicular symptoms, though reproduced gluteal tension bilaterally Negative trendelenberg Gait normal       Electronically signed by:  Angela Farrell [name] 4:07 PM 10/02/22

## 2022-09-27 ENCOUNTER — Encounter: Payer: 59 | Admitting: Physical Therapy

## 2022-09-28 ENCOUNTER — Other Ambulatory Visit: Payer: Self-pay | Admitting: Sports Medicine

## 2022-10-02 ENCOUNTER — Ambulatory Visit: Payer: 59 | Admitting: Sports Medicine

## 2022-10-02 ENCOUNTER — Other Ambulatory Visit: Payer: Self-pay | Admitting: Sports Medicine

## 2022-10-02 VITALS — BP 132/78 | HR 102 | Ht 63.0 in | Wt 205.0 lb

## 2022-10-02 DIAGNOSIS — M797 Fibromyalgia: Secondary | ICD-10-CM

## 2022-10-02 DIAGNOSIS — R768 Other specified abnormal immunological findings in serum: Secondary | ICD-10-CM

## 2022-10-02 DIAGNOSIS — M2550 Pain in unspecified joint: Secondary | ICD-10-CM | POA: Diagnosis not present

## 2022-10-02 NOTE — Patient Instructions (Signed)
4-8 week follow up

## 2022-10-16 ENCOUNTER — Telehealth: Payer: Self-pay | Admitting: Diagnostic Neuroimaging

## 2022-10-16 NOTE — Telephone Encounter (Signed)
LVM and sent mychart msg informing pt of need to reschedule 10/24/22 appt - MD out 

## 2022-10-24 ENCOUNTER — Institutional Professional Consult (permissible substitution): Payer: 59 | Admitting: Diagnostic Neuroimaging

## 2022-11-08 ENCOUNTER — Other Ambulatory Visit: Payer: Self-pay

## 2022-11-08 ENCOUNTER — Ambulatory Visit: Payer: 59 | Admitting: Infectious Disease

## 2022-11-08 ENCOUNTER — Encounter: Payer: Self-pay | Admitting: Infectious Disease

## 2022-11-08 VITALS — BP 126/87 | HR 102 | Temp 97.8°F | Ht 63.0 in | Wt 201.0 lb

## 2022-11-08 DIAGNOSIS — M353 Polymyalgia rheumatica: Secondary | ICD-10-CM | POA: Diagnosis not present

## 2022-11-08 DIAGNOSIS — M2550 Pain in unspecified joint: Secondary | ICD-10-CM | POA: Diagnosis not present

## 2022-11-08 DIAGNOSIS — G032 Benign recurrent meningitis [Mollaret]: Secondary | ICD-10-CM

## 2022-11-08 DIAGNOSIS — R519 Headache, unspecified: Secondary | ICD-10-CM | POA: Diagnosis not present

## 2022-11-08 DIAGNOSIS — G8929 Other chronic pain: Secondary | ICD-10-CM

## 2022-11-08 NOTE — Progress Notes (Signed)
Subjective:  Chief complaint: followup for "Mollaret's meningitis"  [name], female    DOB: 06-02-1975, 48 y.o.   MRN: [id number]  HPI  1 year old African-American woman whom I met in 2010.  At that time she had carried a diagnosis of "aseptic meningitis and had undergone a lumbar puncture previously it was a long hospital.  It had not grown any organisms but did not sent been sent for PCR testing.  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   Indeed her HSV-2 PCR was positive in 2010 as it was again in 2013 on a different hospital admission.   She has been on Valtrex at times to prevent HSV 2 meningitis.  It is interesting because having prophylactic Valtrex to patients with genital herpes does reduce the frequency of genital outbreaks but in setting of HSV-2 meningitis the data is not clear that it helps and there is even some data to suggest it may increase the frequency of recurrent meningitis.   However in the case of Angela Farrell she has felt very strongly that when she is not taking her Valtrex which she takes at a dose of 500 twice daily it is very soon after that she has an recurrence of HSV-2 meningitis.    Does have Corman migraine headaches in the past is felt she can tell the difference.  Still taking her Valtrex twice daily but is stopped taking extra doses of Valtrex even when her headaches worsen.  She is now suffering from multiple musculoskeletal pains including her elbows bilaterally hips and thighs.  She has been worked up by her primary care physician by rheumatology sports medicine without a conclusive diagnosis underlying her symptomatology.      Past Medical History:  Diagnosis Date   Allergy    Anemia    Anxiety    Arthritis    Benign recurrent meningitis (mollaret) 11/18/2018   Chronic headache disorder    s/p Meningitis x 2, tension- type plus  allergic/sinus problems   COVID 08/16/2020   COVID-19 05/2020   History of abnormal Pap smear    Meningitis, viral At age 8 and at age 23   Nasal fracture 2011   Other and unspecified ovarian cysts 2011- pelvic CT   GYN- Dr. Chevis Pretty   Vaginal spotting 06/04/2019   Vision abnormalities     Past Surgical History:  Procedure Laterality Date   BACK SURGERY  02/09/15   L5 - S1 diskectomy   FOOT SURGERY  s/p 2 procedures   LAPAROSCOPY FOR ECTOPIC PREGNANCY  1998   Fallopian tube removed   MOUTH SURGERY     spinal injection  03/10/15   SPINE SURGERY     UTERINE ARTERY EMBOLIZATION      Family History  Problem Relation Age of Onset   Migraines Father        cluster headache   Hyperlipidemia Father    Hypertension Father    Cancer Mother 30       Breast   Stroke Maternal Grandmother    Diabetes Paternal Grandmother    Diabetes Other        paternal relaitve   Seizures Other        maternal relative   Rheum arthritis Other        maternal relatives   Colon polyps Neg Hx    Esophageal cancer Neg Hx    Stomach cancer Neg  Hx    Rectal cancer Neg Hx       Social History   Socioeconomic History   Marital status: Married    Spouse name: Angela Farrell   Number of children: Not on file   Years of education: Not on file   Highest education level: Bachelor's degree (e.g., BA, AB, BS)  Occupational History   Occupation: Chief Financial Officer: [employer]  Tobacco Use   Smoking status: Some Days    Types: Cigarettes   Smokeless tobacco: Never  Vaping Use   Vaping status: Never Used  Substance and Sexual Activity   Alcohol use: Yes    Alcohol/week: 1.0 standard drink of alcohol    Types: 1 Standard drinks or equivalent per week   Drug use: Yes    Types: Marijuana    Comment: per pt "few times a year"   Sexual activity: Yes    Partners: Male    Birth control/protection: Implant    Comment: sexually active w/ female husband  Other Topics Concern   Not on file  Social  History Narrative   Drinks about 12-16oz of coffee a day, occasionally drinks a tea.   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    Social Determinants of Health   Financial Resource Strain: Low Risk  (07/04/2022)   Overall Financial Resource Strain (CARDIA)    Difficulty of Paying Living Expenses: Not hard at all  Food Insecurity: No Food Insecurity (07/04/2022)   Hunger Vital Sign    Worried About Running Out of Food in the Last Year: Never true    Ran Out of Food in the Last Year: Never true  Transportation Needs: No Transportation Needs (07/04/2022)   PRAPARE - Administrator, Civil Service (Medical): No    Lack of Transportation (Non-Medical): No  Physical Activity: Sufficiently Active (07/04/2022)   Exercise Vital Sign    Days of Exercise per Week: 5 days    Minutes of Exercise per Session: 30 min  Stress: Stress Concern Present (07/04/2022)   [employer] of Occupational Health - Occupational Stress Questionnaire    Feeling of Stress : To some extent  Social Connections: Moderately Isolated (07/04/2022)   Social Connection and Isolation Panel [NHANES]    Frequency of Communication with Friends and Family: Once a week    Frequency of Social Gatherings with Friends and Family: Once a week    Attends Religious Services: Never    Database administrator or Organizations: Yes    Attends Engineer, structural: More than 4 times per year    Marital Status: Married    Allergies  Allergen Reactions   Adhesive [Tape] Rash   Other Rash     Current Outpatient Medications:    DULoxetine (CYMBALTA) 30 MG capsule, TAKE 1 CAPSULE BY MOUTH EVERY DAY, Disp: 90 capsule, Rfl: 0   Magnesium 500 MG CAPS, Take 500 mg by mouth 4 (four) times daily., Disp: , Rfl:    omega-3 acid ethyl esters (LOVAZA) 1 g capsule, Take by mouth 2 (two) times daily., Disp: , Rfl:    Potassium Gluconate 595 MG CAPS, Take 595 mg by mouth daily., Disp: , Rfl:    Rimegepant Sulfate (NURTEC) 75 MG TBDP, Take 75 mg  by mouth every other day., Disp: 15 tablet, Rfl: 12   rizatriptan (MAXALT-MLT) 10 MG disintegrating tablet, Take 1 tablet (10 mg total) by mouth as needed for migraine (may repeat x 1 tab after 2 hours; max  2 tabs per day or 8 tabs per month). May repeat in 2 hours if needed, Disp: 8 tablet, Rfl: 12   rosuvastatin (CRESTOR) 10 MG tablet, TAKE 1 TABLET BY MOUTH EVERY DAY, Disp: 90 tablet, Rfl: 3   valACYclovir (VALTREX) 500 MG tablet, Take 1 tablet (500 mg total) by mouth 2 (two) times daily., Disp: 60 tablet, Rfl: 11   valsartan-hydrochlorothiazide (DIOVAN-HCT) 80-12.5 MG tablet, TAKE 1 TABLET BY MOUTH EVERY DAY, Disp: 90 tablet, Rfl: 3   vitamin A 8000 UNIT capsule, Take 8,000 Units by mouth daily., Disp: , Rfl:    vitamin C (ASCORBIC ACID) 500 MG tablet, Take 500 mg by mouth daily., Disp: , Rfl:     Review of Systems  Constitutional:  Negative for activity change, appetite change, chills, diaphoresis, fatigue, fever and unexpected weight change.  HENT:  Negative for congestion, rhinorrhea, sinus pressure, sneezing, sore throat and trouble swallowing.   Eyes:  Negative for photophobia and visual disturbance.  Respiratory:  Negative for cough, chest tightness, shortness of breath, wheezing and stridor.   Cardiovascular:  Negative for chest pain, palpitations and leg swelling.  Gastrointestinal:  Negative for abdominal distention, abdominal pain, anal bleeding, blood in stool, constipation, diarrhea, nausea and vomiting.  Genitourinary:  Negative for difficulty urinating, dysuria, flank pain and hematuria.  Musculoskeletal:  Positive for arthralgias and myalgias. Negative for back pain, gait problem and joint swelling.  Skin:  Negative for color change, pallor, rash and wound.  Neurological:  Negative for dizziness, tremors, weakness and light-headedness.  Hematological:  Negative for adenopathy. Does not bruise/bleed easily.  Psychiatric/Behavioral:  Negative for agitation, behavioral problems,  confusion, decreased concentration, dysphoric mood and sleep disturbance.        Objective:   Physical Exam Constitutional:      General: She is not in acute distress.    Appearance: Normal appearance. She is well-developed. She is not ill-appearing or diaphoretic.  HENT:     Head: Normocephalic and atraumatic.     Right Ear: Hearing and external ear normal.     Left Ear: Hearing and external ear normal.     Nose: No nasal deformity or rhinorrhea.  Eyes:     General: No scleral icterus.    Conjunctiva/sclera: Conjunctivae normal.     Right eye: Right conjunctiva is not injected.     Left eye: Left conjunctiva is not injected.     Pupils: Pupils are equal, round, and reactive to light.  Neck:     Vascular: No JVD.  Cardiovascular:     Rate and Rhythm: Normal rate and regular rhythm.     Heart sounds: Normal heart sounds, S1 normal and S2 normal.  Pulmonary:     Effort: Pulmonary effort is normal. No respiratory distress.  Abdominal:     General: There is no distension.     Palpations: Abdomen is soft.  Musculoskeletal:        General: Normal range of motion.     Right shoulder: Normal.     Left shoulder: Normal.     Cervical back: Normal range of motion and neck supple.     Right hip: Normal.     Left hip: Normal.     Right knee: Normal.     Left knee: Normal.  Lymphadenopathy:     Head:     Right side of head: No submandibular, preauricular or posterior auricular adenopathy.     Left side of head: No submandibular, preauricular or posterior auricular adenopathy.  Cervical: No cervical adenopathy.     Right cervical: No superficial or deep cervical adenopathy.    Left cervical: No superficial or deep cervical adenopathy.  Skin:    General: Skin is warm and dry.     Coloration: Skin is not pale.     Findings: No abrasion, bruising, ecchymosis, erythema, lesion or rash.     Nails: There is no clubbing.  Neurological:     General: No focal deficit present.      Mental Status: She is alert and oriented to person, place, and time.     Sensory: No sensory deficit.     Coordination: Coordination normal.     Gait: Gait normal.  Psychiatric:        Attention and Perception: She is attentive.        Mood and Affect: Mood normal.        Speech: Speech normal.        Behavior: Behavior normal. Behavior is cooperative.        Thought Content: Thought content normal.        Judgment: Judgment normal.           Assessment & Plan:  Mollaret's meningitis:she wants to actually see how she does OFF of tracks and she is planning on stopping it but holding onto medication so that she initiate treatment if need be  Migraine headaches follow-up with neurology  Multiple musculoskeletal pains: Being worked up by sports medicine rheumatology primary care.  I shared with her some of my own story with my and musculoskeletal pains and some of the series of the guards to mind/body and pain.  I have personally spent 26 minutes involved in face-to-face and non-face-to-face activities for this patient on the day of the visit. Professional time spent includes the following activities: Preparing to see the patient (review of tests), Obtaining and/or reviewing separately obtained history (admission/discharge record), Performing a medically appropriate examination and/or evaluation , Ordering medications/tests/procedures, referring and communicating with other health care professionals, Documenting clinical information in the EMR, Independently interpreting results (not separately reported), Communicating results to the patient/family/caregiver, Counseling and educating the patient/family/caregiver and Care coordination (not separately reported).

## 2022-11-10 NOTE — Progress Notes (Signed)
Aleen Sells D.[name] [address] [place] [address] Phone: [phone]   Assessment and Plan:     1. Fibromyalgia 2. Positive ANA (antinuclear antibody) 3. polyarthralgia -Chronic with exacerbation, improving, subsequent visit - Still consistent with fibromyalgia with patient not noting significant difference with using duloxetine 30 mg daily. - Increase to duloxetine 60 mg daily.  Refill sent - I do not have records of patient's rheumatology visit, though she states further workup was done and was unremarkable - Can continue physical therapy - If no improvement with increased dose of duloxetine, could consider gabapentin versus pain management referral  4. Chronic pain of left thumb  -Chronic with exacerbation, initial visit - Chronic and intermittent left thumb pain that has gradually worsened and is located to IP joint, first MCP, [facility] - Patient states that her pain is most severe at IP joint, and elected for IP CSI at today's visit.  Tolerated well per note below - Recommend using thumb spica brace to decrease multiple areas of pain - May use Tylenol and topical Voltaren gel for day-to-day pain relief  Procedure: First digit IP joint Injection Side: Left Diagnosis: Left thumb pain    After explaining the procedure, viable alternatives, risks, and answering any questions, consent was given verbally. The site was cleaned with alcohol prep.   A steroid injection was performed under  sing 0.28ml of 1% lidocaine without epinephrine and 20 mg of Kenalog 40 into first IP joint on the right. This was well tolerated and resulted in  relief.  Needle was removed and dressing placed and post injection instructions were given including  a discussion of likely return of pain today after the anesthetic wears off (with the possibility of worsened pain) until the steroid starts to work in 1-3 days.   Pt was advised to call or return to clinic if these  symptoms worsen or fail to improve as anticipated.   Pertinent previous records reviewed include none   Follow Up: 4 weeks for reevaluation   Subjective:   I, Moenique Parris, am serving as a Neurosurgeon for Doctor Richardean Sale   Chief Complaint: right elbow , bilat hip    HPI:  07/20/2022 Patient is a 47 year old female complaining of right elbow and bilat hip pain. Patient states tht she has had hip pain for a year, went to [facility] , pt , and chiro, she states she gets kinked up chiro told her , her pain was coming from her back , hx of back surgery, states she walks a lot , the pain will ease up if she walks a lot    Elbow pain bilat has numbness and tingling going down both , no MOI, pain going on for a while she was given a shot and that helped , she works on a computed raily but she has purhcased things that will help ergonomically , decreased grip strength , pain radiates up and down her arm    08/23/2022 Patient states she is seeing murphy wainer and received a CSI bilat  elbow but she is still TTP , they want her to get a nerve conduction study , she is still in pain all over    09/06/2022 Patient states that she has good days and bad days mostly bad days, still gets achy pain in forearms and elbow and hips are always in pain    10/02/2022 Patient states meds helped a lot and now the pain has  come back    11/13/2022 Patient states that she has bee having some issues in her thumb , still gets shooting pain in her forearm     Relevant Historical Information: Hypertension   Additional pertinent review of systems negative.   Current Outpatient Medications:    Magnesium 500 MG CAPS, Take 500 mg by mouth 4 (four) times daily., Disp: , Rfl:    omega-3 acid ethyl esters (LOVAZA) 1 g capsule, Take by mouth 2 (two) times daily., Disp: , Rfl:    Potassium Gluconate 595 MG CAPS, Take 595 mg by mouth daily., Disp: , Rfl:    Rimegepant Sulfate (NURTEC) 75 MG TBDP, Take 75 mg by  mouth every other day., Disp: 15 tablet, Rfl: 12   rizatriptan (MAXALT-MLT) 10 MG disintegrating tablet, Take 1 tablet (10 mg total) by mouth as needed for migraine (may repeat x 1 tab after 2 hours; max 2 tabs per day or 8 tabs per month). May repeat in 2 hours if needed, Disp: 8 tablet, Rfl: 12   rosuvastatin (CRESTOR) 10 MG tablet, TAKE 1 TABLET BY MOUTH EVERY DAY, Disp: 90 tablet, Rfl: 3   valACYclovir (VALTREX) 500 MG tablet, Take 1 tablet (500 mg total) by mouth 2 (two) times daily., Disp: 60 tablet, Rfl: 11   valsartan-hydrochlorothiazide (DIOVAN-HCT) 80-12.5 MG tablet, TAKE 1 TABLET BY MOUTH EVERY DAY, Disp: 90 tablet, Rfl: 3   vitamin A 8000 UNIT capsule, Take 8,000 Units by mouth daily., Disp: , Rfl:    vitamin C (ASCORBIC ACID) 500 MG tablet, Take 500 mg by mouth daily., Disp: , Rfl:    DULoxetine (CYMBALTA) 60 MG capsule, Take 1 capsule (60 mg total) by mouth daily., Disp: 30 capsule, Rfl: 0   Objective:     Vitals:   11/13/22 1551  BP: 126/88  Pulse: (!) 106  SpO2: 96%  Weight: 201 lb (91.2 kg)  Height: 5\' 3"  (1.6 m)      Body mass index is 35.61 kg/m.    Physical Exam:    General: awake, alert, and oriented no acute distress, nontoxic Skin: no suspicious lesions or rashes Neuro:sensation intact distally with no deficits, normal muscle tone, no atrophy, strength 5/5 in all tested lower ext groups Psych: normal mood and affect, speech clear  Left hand: TTP moderate IP joint, mild CMC and first MCP Negative grind test at first MCP and CMC No pain with resisted thumb extension, flexion, abduction Negative Lourena Simmonds   Electronically signed by:  Aleen Sells [name] 4:10 PM 11/13/22

## 2022-11-13 ENCOUNTER — Ambulatory Visit: Payer: 59 | Admitting: Sports Medicine

## 2022-11-13 VITALS — BP 126/88 | HR 106 | Ht 63.0 in | Wt 201.0 lb

## 2022-11-13 DIAGNOSIS — G8929 Other chronic pain: Secondary | ICD-10-CM

## 2022-11-13 DIAGNOSIS — R768 Other specified abnormal immunological findings in serum: Secondary | ICD-10-CM

## 2022-11-13 DIAGNOSIS — M797 Fibromyalgia: Secondary | ICD-10-CM

## 2022-11-13 DIAGNOSIS — M2550 Pain in unspecified joint: Secondary | ICD-10-CM | POA: Diagnosis not present

## 2022-11-13 DIAGNOSIS — M79645 Pain in left finger(s): Secondary | ICD-10-CM

## 2022-11-13 MED ORDER — DULOXETINE HCL 60 MG PO CPEP: 60.0000 mg | ORAL_CAPSULE | Freq: Every day | ORAL | 0 refills | Status: DC

## 2022-11-13 NOTE — Patient Instructions (Signed)
Duloxetine 60 mg  4 week follow up  Recommend using a thumb spica brace when at work

## 2022-11-28 NOTE — Progress Notes (Unsigned)
[name] [address] [place] [address] Phone: [phone]   Assessment and Plan:     There are no diagnoses linked to this encounter.  ***   Pertinent previous records reviewed include ***   Follow Up: ***     Subjective:   I, Angela Farrell, am serving as a Neurosurgeon for Doctor Richardean Sale   Chief Complaint: right elbow , bilat hip    HPI:  07/20/2022 Patient is a 47 year old female complaining of right elbow and bilat hip pain. Patient states tht she has had hip pain for a year, went to [facility] , pt , and chiro, she states she gets kinked up chiro told her , her pain was coming from her back , hx of back surgery, states she walks a lot , the pain will ease up if she walks a lot    Elbow pain bilat has numbness and tingling going down both , no MOI, pain going on for a while she was given a shot and that helped , she works on a computed raily but she has purhcased things that will help ergonomically , decreased grip strength , pain radiates up and down her arm    08/23/2022 Patient states she is seeing murphy wainer and received a CSI bilat  elbow but she is still TTP , they want her to get a nerve conduction study , she is still in pain all over    09/06/2022 Patient states that she has good days and bad days mostly bad days, still gets achy pain in forearms and elbow and hips are always in pain    10/02/2022 Patient states meds helped a lot and now the pain has come back    11/13/2022 Patient states that she has bee having some issues in her thumb , still gets shooting pain in her forearm    11/29/2022 Patient states   Relevant Historical Information: Hypertension  Additional pertinent review of systems negative.   Current Outpatient Medications:    DULoxetine (CYMBALTA) 60 MG capsule, Take 1 capsule (60 mg total) by mouth daily., Disp: 30 capsule, Rfl: 0   Magnesium 500 MG CAPS, Take 500 mg by mouth  4 (four) times daily., Disp: , Rfl:    omega-3 acid ethyl esters (LOVAZA) 1 g capsule, Take by mouth 2 (two) times daily., Disp: , Rfl:    Potassium Gluconate 595 MG CAPS, Take 595 mg by mouth daily., Disp: , Rfl:    Rimegepant Sulfate (NURTEC) 75 MG TBDP, Take 75 mg by mouth every other day., Disp: 15 tablet, Rfl: 12   rizatriptan (MAXALT-MLT) 10 MG disintegrating tablet, Take 1 tablet (10 mg total) by mouth as needed for migraine (may repeat x 1 tab after 2 hours; max 2 tabs per day or 8 tabs per month). May repeat in 2 hours if needed, Disp: 8 tablet, Rfl: 12   rosuvastatin (CRESTOR) 10 MG tablet, TAKE 1 TABLET BY MOUTH EVERY DAY, Disp: 90 tablet, Rfl: 3   valACYclovir (VALTREX) 500 MG tablet, Take 1 tablet (500 mg total) by mouth 2 (two) times daily., Disp: 60 tablet, Rfl: 11   valsartan-hydrochlorothiazide (DIOVAN-HCT) 80-12.5 MG tablet, TAKE 1 TABLET BY MOUTH EVERY DAY, Disp: 90 tablet, Rfl: 3   vitamin A 8000 UNIT capsule, Take 8,000 Units by mouth daily., Disp: , Rfl:    vitamin C (ASCORBIC ACID) 500 MG tablet, Take 500 mg by mouth daily., Disp: , Rfl:  Objective:     There were no vitals filed for this visit.    There is no height or weight on file to calculate BMI.    Physical Exam:    ***   Electronically signed by:  [facility]leen [name] 11:55 AM 11/28/22

## 2022-11-29 ENCOUNTER — Ambulatory Visit: Payer: 59 | Admitting: Sports Medicine

## 2022-11-29 VITALS — BP 136/84 | HR 94 | Ht 63.0 in | Wt 197.0 lb

## 2022-11-29 DIAGNOSIS — M79645 Pain in left finger(s): Secondary | ICD-10-CM

## 2022-11-29 DIAGNOSIS — M25512 Pain in left shoulder: Secondary | ICD-10-CM

## 2022-11-29 DIAGNOSIS — G8929 Other chronic pain: Secondary | ICD-10-CM

## 2022-11-29 DIAGNOSIS — R768 Other specified abnormal immunological findings in serum: Secondary | ICD-10-CM | POA: Diagnosis not present

## 2022-11-29 DIAGNOSIS — M797 Fibromyalgia: Secondary | ICD-10-CM

## 2022-11-29 DIAGNOSIS — M2550 Pain in unspecified joint: Secondary | ICD-10-CM

## 2022-11-29 MED ORDER — GABAPENTIN 100 MG PO CAPS: 100.0000 mg | ORAL_CAPSULE | Freq: Three times a day (TID) | ORAL | 0 refills | Status: DC

## 2022-11-29 NOTE — Patient Instructions (Signed)
Gabapentin 100 mg 3x a day  Shoulder HEP 4 week follow up

## 2022-12-01 ENCOUNTER — Other Ambulatory Visit: Payer: Self-pay | Admitting: Emergency Medicine

## 2022-12-01 DIAGNOSIS — E785 Hyperlipidemia, unspecified: Secondary | ICD-10-CM

## 2022-12-01 DIAGNOSIS — I10 Essential (primary) hypertension: Secondary | ICD-10-CM

## 2022-12-03 DIAGNOSIS — R202 Paresthesia of skin: Secondary | ICD-10-CM | POA: Insufficient documentation

## 2022-12-07 ENCOUNTER — Other Ambulatory Visit: Payer: Self-pay | Admitting: Sports Medicine

## 2022-12-13 ENCOUNTER — Ambulatory Visit: Payer: 59 | Admitting: Sports Medicine

## 2022-12-23 ENCOUNTER — Other Ambulatory Visit: Payer: Self-pay | Admitting: Sports Medicine

## 2022-12-28 DIAGNOSIS — M62830 Muscle spasm of back: Secondary | ICD-10-CM | POA: Insufficient documentation

## 2023-01-01 ENCOUNTER — Ambulatory Visit: Payer: 59 | Admitting: Sports Medicine

## 2023-01-01 VITALS — BP 138/80 | Ht 63.0 in | Wt 203.0 lb

## 2023-01-01 DIAGNOSIS — M2550 Pain in unspecified joint: Secondary | ICD-10-CM | POA: Diagnosis not present

## 2023-01-01 DIAGNOSIS — M9903 Segmental and somatic dysfunction of lumbar region: Secondary | ICD-10-CM

## 2023-01-01 DIAGNOSIS — M797 Fibromyalgia: Secondary | ICD-10-CM

## 2023-01-01 DIAGNOSIS — M9908 Segmental and somatic dysfunction of rib cage: Secondary | ICD-10-CM

## 2023-01-01 DIAGNOSIS — M9902 Segmental and somatic dysfunction of thoracic region: Secondary | ICD-10-CM | POA: Diagnosis not present

## 2023-01-01 DIAGNOSIS — M546 Pain in thoracic spine: Secondary | ICD-10-CM | POA: Diagnosis not present

## 2023-01-01 DIAGNOSIS — G8929 Other chronic pain: Secondary | ICD-10-CM

## 2023-01-01 MED ORDER — MELOXICAM 15 MG PO TABS: 15.0000 mg | ORAL_TABLET | Freq: Every day | ORAL | 0 refills | Status: DC

## 2023-01-01 MED ORDER — KETOROLAC TROMETHAMINE 60 MG/2ML IM SOLN
60.0000 mg | Freq: Once | INTRAMUSCULAR | Status: AC
Start: 2023-01-01 — End: 2023-01-01
  Administered 2023-01-01: 60 mg via INTRAMUSCULAR

## 2023-01-01 MED ORDER — METHYLPREDNISOLONE ACETATE 80 MG/ML IJ SUSP
80.0000 mg | Freq: Once | INTRAMUSCULAR | Status: AC
Start: 2023-01-01 — End: 2023-01-01
  Administered 2023-01-01: 80 mg via INTRAMUSCULAR

## 2023-01-01 MED ORDER — CYCLOBENZAPRINE HCL 10 MG PO TABS: 10.0000 mg | ORAL_TABLET | Freq: Every evening | ORAL | 0 refills | Status: DC | PRN

## 2023-01-01 NOTE — Addendum Note (Signed)
Addended by: Dierdre Searles on: 01/01/2023 04:39 PM   Modules accepted: Orders

## 2023-01-01 NOTE — Progress Notes (Signed)
[name] [address] [place] [address] Phone: [phone]   Assessment and Plan:     1. Chronic left-sided thoracic back pain 2. Fibromyalgia 3. Polyarthralgia 4. Somatic dysfunction of thoracic region 5. Somatic dysfunction of lumbar region 6. Somatic dysfunction of rib region -Chronic with exacerbation, subsequent visit -Flare of mid back pain on left side consistent with dysfunction of thoracic and rib regions without MOI - Patient elected for IM injection of methylprednisone 80 mg/Toradol 60 mg.  Injection given in clinic today and tolerated well. - Start meloxicam 15 mg daily x2 weeks.  If still having pain after 2 weeks, complete 3rd-week of meloxicam. May use remaining meloxicam as needed once daily for pain control.  Do not to use additional NSAIDs while taking meloxicam.  May use Tylenol [phone] mg 2 to 3 times a day for breakthrough pain.  -Start Flexeril 10 mg nightly as needed for muscle spasms - Patient overall is felt little to no improvement with IP joint CSI, gabapentin 100 mg 3 times a day, physical therapy, so elected to stop his therapies - Patient has noticed mild overall improvement since starting duloxetine 60 mg daily.  Continue duloxetine 60 mg daily - Patient did for OMT today.  Tolerated well per note below. - Decision today to treat with OMT was based on Physical Exam  After verbal consent patient was treated with HVLA (high velocity low amplitude), ME (muscle energy), FPR (flex positional release), ST (soft tissue), techniques in rib, thoracic, lumbar, and  areas. Patient tolerated the procedure well with improvement in symptoms.  Patient educated on potential side effects of soreness and recommended to rest, hydrate, and use Tylenol as needed for pain control.   Pertinent previous records reviewed include none   Follow Up: As needed if no improvement or worsening of symptoms.  Could consider  repeat OMT for thoracic/rib cage dysfunction.  Otherwise could discuss pain management referral for further treatment of possible fibromyalgia.  Patient also plans on following up with neurosurgery to further discuss chronic neck pain   Subjective:   I, Moenique Parris, am serving as a Neurosurgeon for Doctor Richardean Sale   Chief Complaint: right elbow , bilat hip    HPI:  07/20/2022 Patient is a 47 year old female complaining of right elbow and bilat hip pain. Patient states tht she has had hip pain for a year, went to [facility] , pt , and chiro, she states she gets kinked up chiro told her , her pain was coming from her back , hx of back surgery, states she walks a lot , the pain will ease up if she walks a lot    Elbow pain bilat has numbness and tingling going down both , no MOI, pain going on for a while she was given a shot and that helped , she works on a computed raily but she has purhcased things that will help ergonomically , decreased grip strength , pain radiates up and down her arm    08/23/2022 Patient states she is seeing murphy wainer and received a CSI bilat  elbow but she is still TTP , they want her to get a nerve conduction study , she is still in pain all over    09/06/2022 Patient states that she has good days and bad days mostly bad days, still gets achy pain in forearms and elbow and hips are always in pain    10/02/2022 Patient states meds  helped a lot and now the pain has come back    11/13/2022 Patient states that she has bee having some issues in her thumb , still gets shooting pain in her forearm    11/29/2022 Patient states that her shoulder to her elbow has been in pain for some weeks. She states it feels like it is locking up . Still has pain in her thumb   01/01/2023 Patient states she was doing okay, then she flared her thoracic back and isnt able to catch her breath she states its a muscle spasm    Relevant Historical Information: Hypertension  Additional  pertinent review of systems negative.   Current Outpatient Medications:    cyclobenzaprine (FLEXERIL) 10 MG tablet, Take 1 tablet (10 mg total) by mouth at bedtime as needed for muscle spasms., Disp: 30 tablet, Rfl: 0   DULoxetine (CYMBALTA) 60 MG capsule, TAKE 1 CAPSULE BY MOUTH EVERY DAY, Disp: 90 capsule, Rfl: 1   gabapentin (NEURONTIN) 100 MG capsule, TAKE 1 CAPSULE (100 MG TOTAL) BY MOUTH THREE TIMES DAILY., Disp: 90 capsule, Rfl: 0   Magnesium 500 MG CAPS, Take 500 mg by mouth 4 (four) times daily., Disp: , Rfl:    meloxicam (MOBIC) 15 MG tablet, Take 1 tablet (15 mg total) by mouth daily., Disp: 30 tablet, Rfl: 0   omega-3 acid ethyl esters (LOVAZA) 1 g capsule, Take by mouth 2 (two) times daily., Disp: , Rfl:    Potassium Gluconate 595 MG CAPS, Take 595 mg by mouth daily., Disp: , Rfl:    Rimegepant Sulfate (NURTEC) 75 MG TBDP, Take 75 mg by mouth every other day., Disp: 15 tablet, Rfl: 12   rizatriptan (MAXALT-MLT) 10 MG disintegrating tablet, Take 1 tablet (10 mg total) by mouth as needed for migraine (may repeat x 1 tab after 2 hours; max 2 tabs per day or 8 tabs per month). May repeat in 2 hours if needed, Disp: 8 tablet, Rfl: 12   rosuvastatin (CRESTOR) 10 MG tablet, TAKE 1 TABLET BY MOUTH EVERY DAY, Disp: 90 tablet, Rfl: 3   valACYclovir (VALTREX) 500 MG tablet, Take 1 tablet (500 mg total) by mouth 2 (two) times daily., Disp: 60 tablet, Rfl: 11   valsartan-hydrochlorothiazide (DIOVAN-HCT) 80-12.5 MG tablet, TAKE 1 TABLET BY MOUTH EVERY DAY, Disp: 90 tablet, Rfl: 3   vitamin A 8000 UNIT capsule, Take 8,000 Units by mouth daily., Disp: , Rfl:    vitamin C (ASCORBIC ACID) 500 MG tablet, Take 500 mg by mouth daily., Disp: , Rfl:    Objective:     Vitals:   01/01/23 1553  BP: 138/80  Weight: 203 lb (92.1 kg)  Height: 5\' 3"  (1.6 m)      Body mass index is 35.96 kg/m.    Physical Exam:    General: Well-appearing, cooperative, sitting comfortably in no acute distress.   OMT  Physical Exam:   Rib: Bilateral elevated first rib with TTP Thoracic: TTP paraspinal, T8-10 RLSR Lumbar: TTP paraspinal, T11-L1 RRSL     Electronically signed by:  Aleen [name] 4:21 PM 01/01/23

## 2023-01-01 NOTE — Patient Instructions (Addendum)
-   Start meloxicam 15 mg daily x2 weeks.  If still having pain after 2 weeks, complete 3rd-week of meloxicam. May use remaining meloxicam as needed once daily for pain control.  Do not to use additional NSAIDs while taking meloxicam.  May use Tylenol [phone] mg 2 to 3 times a day for breakthrough pain. Flexeril 10 mg nightly as needed for muscle spasm  As needed follow up

## 2023-01-03 ENCOUNTER — Encounter: Payer: Self-pay | Admitting: Emergency Medicine

## 2023-01-03 ENCOUNTER — Encounter: Payer: 59 | Admitting: Emergency Medicine

## 2023-01-07 DIAGNOSIS — R200 Anesthesia of skin: Secondary | ICD-10-CM | POA: Insufficient documentation

## 2023-01-08 ENCOUNTER — Encounter: Payer: Self-pay | Admitting: Diagnostic Neuroimaging

## 2023-01-08 ENCOUNTER — Ambulatory Visit (INDEPENDENT_AMBULATORY_CARE_PROVIDER_SITE_OTHER): Payer: 59 | Admitting: Diagnostic Neuroimaging

## 2023-01-08 VITALS — BP 131/85 | HR 88 | Ht 63.0 in | Wt 206.0 lb

## 2023-01-08 DIAGNOSIS — R200 Anesthesia of skin: Secondary | ICD-10-CM

## 2023-01-08 DIAGNOSIS — R202 Paresthesia of skin: Secondary | ICD-10-CM | POA: Diagnosis not present

## 2023-01-08 NOTE — Progress Notes (Signed)
[facility]  PATIENT: Angela Farrell DOB: 1976-02-02  REFERRING CLINICIAN: Georgina Quint, *  HISTORY FROM: patient REASON FOR VISIT: follow up   HISTORICAL  CHIEF COMPLAINT:  Chief Complaint  Patient presents with   New Problem     RM 6, here alone  Pt is here for ulnar neuropathy. Pt would like to get an EMG.     HISTORY OF PRESENT ILLNESS:   UPDATE (01/08/23, VRP): Here for eval for right elbow pain / numbness (intermittent). Into digits 4,5. [facility], and now requested to have EMG/NCS for ulnar neuropathy eval. HA are stable (2-3 per month). Has been to rheumatology (ruled out autoimmune issues).   UPDATE (04/18/22, VRP): Since last visit, doing fair. Since Nov 2023, almost daily HA. 2-3 per month now. Now on nurtec every other daily. Rizatriptan helps.   UPDATE (11/15/21, VRP): Here for migraine follow up. Avg ~5+ migraines per month. Not on rizatriptan (refills ran out; previously only getting 4 tabs per month. ? Insurance reason?).   UPDATE (12/10/17, VRP): Since last visit, doing about the same. Symptoms are moderate. Still with daily headaches + 2 severe migraines per month. No alleviating or aggravating factors. Tolerating meds.    UPDATE 10/23/16: Since last visit, was doing well until last few months. Now daily headaches, and severe migraine 1 per week. [place]th week, and having more migraines.   UPDATE 04/25/16: Since last visit, doing well. Only 2-3 migraine in the last 6-7 months. Much improved.  Last migraine was a few weeks ago. Has been doing better with yoga and exercise.  UPDATE 09/21/15: Since last visit, overall stable. Avg 1-2 migraine per month. Avg 3-4 HA per week. Currently with right forehead headache.   UPDATE 06/21/15: Since last visit, was doing well with higher TPX, but now more work stress, and more HA since March 2017.  UPDATE 03/23/15: Since last visit, was doing  better with TPX + rizatriptan; then had back surgery Feb 09, 2015; then Dec 2016 having more HA.  PRIOR HPI (12/25/14): 47 year old ambidextrous female here for evaluation of headaches. 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 Patient has history of headaches starting in childhood around 47 years old. She describes frontal, eye pressure, throbbing severe headaches with neck pain, photophobia, phonophobia and occasionally nausea. Headaches can last hours to days at a time. Triggers include stress. Over the years headaches varied in frequency and severity. More recently headaches have been occurring roughly 4-6 days per month. Patient has family history of cluster headache in her father. Patient herself has never been diagnosed with migraine headaches or tried migraine medications. Patient had been hospitalized several times for headache, light sensitivity, neck pain, fevers, with testing and lumbar puncture results demonstrating viral/aseptic meningitis. 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 On the basis of this patient was diagnosed with recurrent meningitis (Mollaret's) in 2013. Since that time she has been prescribed valacyclovir 500 mg twice a day to be taken during times of recurrence of symptoms. She uses this 6-8 months out of the year, 3-4 days at a time. This has helped keep some of her symptoms under control. Her last hospitalization was in 2013 for meningitis. More recently, last night patient had recurrence of headache. She had some nausea and vomiting last night. This morning and during office visit patient is suffering with severe headache, sensitivity to light,  neck pain. Patient states that her current symptoms feel slightly similar to her prior meningitis attacks but not as severe. Patient has not started valacyclovir yet for this attack.   REVIEW OF SYSTEMS: Full 14 system review of systems performed and negative except for: anemia runny nose  neck pain freq waking.   ALLERGIES: Allergies  Allergen Reactions   Adhesive [Tape] Rash   Other Rash    HOME MEDICATIONS: Outpatient Medications Prior to Visit  Medication Sig Dispense Refill   Acetaminophen (TYLENOL 8 HOUR PO) Take by mouth.     cyclobenzaprine (FLEXERIL) 10 MG tablet Take 1 tablet (10 mg total) by mouth at bedtime as needed for muscle spasms. 30 tablet 0   DULoxetine (CYMBALTA) 60 MG capsule TAKE 1 CAPSULE BY MOUTH EVERY DAY 90 capsule 1   gabapentin (NEURONTIN) 100 MG capsule TAKE 1 CAPSULE (100 MG TOTAL) BY MOUTH THREE TIMES DAILY. 90 capsule 0   Magnesium 500 MG CAPS Take 500 mg by mouth 4 (four) times daily.     meloxicam (MOBIC) 15 MG tablet Take 1 tablet (15 mg total) by mouth daily. 30 tablet 0   omega-3 acid ethyl esters (LOVAZA) 1 g capsule Take by mouth 2 (two) times daily.     Potassium Gluconate 595 MG CAPS Take 595 mg by mouth daily.     Rimegepant Sulfate (NURTEC) 75 MG TBDP Take 75 mg by mouth every other day. 15 tablet 12   rizatriptan (MAXALT-MLT) 10 MG disintegrating tablet Take 1 tablet (10 mg total) by mouth as needed for migraine (may repeat x 1 tab after 2 hours; max 2 tabs per day or 8 tabs per month). May repeat in 2 hours if needed 8 tablet 12   rosuvastatin (CRESTOR) 10 MG tablet TAKE 1 TABLET BY MOUTH EVERY DAY 90 tablet 3   valACYclovir (VALTREX) 500 MG tablet Take 1 tablet (500 mg total) by mouth 2 (two) times daily. 60 tablet 11   valsartan-hydrochlorothiazide (DIOVAN-HCT) 80-12.5 MG tablet TAKE 1 TABLET BY MOUTH EVERY DAY 90 tablet 3   vitamin A 8000 UNIT capsule Take 8,000 Units by mouth daily.     vitamin C (ASCORBIC ACID) 500 MG tablet Take 500 mg by mouth daily.     No facility-administered medications prior to visit.    PAST MEDICAL HISTORY: Past Medical History:  Diagnosis Date   Allergy    Anemia    Anxiety    Arthritis    Benign recurrent meningitis (mollaret) 11/18/2018   Chronic headache disorder    s/p Meningitis x  2, tension- type plus allergic/sinus problems   COVID 08/16/2020   COVID-19 05/2020   History of abnormal Pap smear    Meningitis, viral At age 20 and at age 12   Nasal fracture 2011   Other and unspecified ovarian cysts 2011- pelvic CT   GYN- Dr. Chevis Pretty   Vaginal spotting 06/04/2019   Vision abnormalities     PAST SURGICAL HISTORY: Past Surgical History:  Procedure Laterality Date   BACK SURGERY  02/09/15   L5 - S1 diskectomy   FOOT SURGERY  s/p 2 procedures   LAPAROSCOPY FOR ECTOPIC PREGNANCY  1998   Fallopian tube removed   MOUTH SURGERY     spinal injection  03/10/15   SPINE SURGERY     UTERINE ARTERY EMBOLIZATION      FAMILY HISTORY: Family History  Problem Relation Age of Onset   Migraines Father        cluster headache  Hyperlipidemia Father    Hypertension Father    Cancer Mother 87       Breast   Stroke Maternal Grandmother    Diabetes Paternal Grandmother    Diabetes Other        paternal relaitve   Seizures Other        maternal relative   Rheum arthritis Other        maternal relatives   Colon polyps Neg Hx    Esophageal cancer Neg Hx    Stomach cancer Neg Hx    Rectal cancer Neg Hx     SOCIAL HISTORY:  Social History   Socioeconomic History   Marital status: Married    Spouse name: [name]   Number of children: Not on file   Years of education: Not on file   Highest education level: Bachelor's degree (e.g., BA, AB, BS)  Occupational History   Occupation: Chief Financial Officer: [employer]  Tobacco Use   Smoking status: Some Days    Types: Cigarettes   Smokeless tobacco: Never  Vaping Use   Vaping status: Never Used  Substance and Sexual Activity   Alcohol use: Yes    Alcohol/week: 1.0 standard drink of alcohol    Types: 1 Standard drinks or equivalent per week   Drug use: Yes    Types: Marijuana    Comment: per pt "few times a year"   Sexual activity: Yes    Partners: Male    Birth control/protection: Implant    Comment:  sexually active w/ female husband  Other Topics Concern   Not on file  Social History Narrative   Drinks about 12-16oz of coffee a day, occasionally drinks a tea.   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    Social Determinants of Health   Financial Resource Strain: Low Risk  (07/04/2022)   Overall Financial Resource Strain (CARDIA)    Difficulty of Paying Living Expenses: Not hard at all  Food Insecurity: No Food Insecurity (07/04/2022)   Hunger Vital Sign    Worried About Running Out of Food in the Last Year: Never true    Ran Out of Food in the Last Year: Never true  Transportation Needs: No Transportation Needs (07/04/2022)   PRAPARE - Administrator, Civil Service (Medical): No    Lack of Transportation (Non-Medical): No  Physical Activity: Sufficiently Active (07/04/2022)   Exercise Vital Sign    Days of Exercise per Week: 5 days    Minutes of Exercise per Session: 30 min  Stress: Stress Concern Present (07/04/2022)   [employer] of Occupational Health - Occupational Stress Questionnaire    Feeling of Stress : To some extent  Social Connections: Moderately Isolated (07/04/2022)   Social Connection and Isolation Panel [NHANES]    Frequency of Communication with Friends and Family: Once a week    Frequency of Social Gatherings with Friends and Family: Once a week    Attends Religious Services: Never    Database administrator or Organizations: Yes    Attends Engineer, structural: More than 4 times per year    Marital Status: Married  Catering manager Violence: Not on file     PHYSICAL EXAM  GENERAL EXAM/CONSTITUTIONAL: Vitals:  Vitals:   01/08/23 1457  BP: 131/85  Pulse: 88  Weight: 206 lb (93.4 kg)  Height: 5\' 3"  (1.6 m)   Body mass index is 36.49 kg/m. Wt Readings from Last 3 Encounters:  01/08/23 206 lb (  93.4 kg)  01/01/23 203 lb (92.1 kg)  11/29/22 197 lb (89.4 kg)   Patient is in no distress; well developed, nourished and groomed; neck is  supple  CARDIOVASCULAR: Examination of carotid arteries is normal; no carotid bruits Regular rate and rhythm, no murmurs Examination of peripheral vascular system by observation and palpation is normal  EYES: Ophthalmoscopic exam of optic discs and posterior segments is normal; no papilledema or hemorrhages No results found.  MUSCULOSKELETAL: Gait, strength, tone, movements noted in Neurologic exam below  NEUROLOGIC: MENTAL STATUS:      No data to display         awake, alert, oriented to person, place and time recent and remote memory intact normal attention and concentration language fluent, comprehension intact, naming intact fund of knowledge appropriate  CRANIAL NERVE:  2nd - no papilledema on fundoscopic exam 2nd, 3rd, 4th, 6th - pupils equal and reactive to light, visual fields full to confrontation, extraocular muscles intact, no nystagmus 5th - facial sensation symmetric 7th - facial strength symmetric 8th - hearing intact 9th - palate elevates symmetrically, uvula midline 11th - shoulder shrug symmetric 12th - tongue protrusion midline  MOTOR:  normal bulk and tone, full strength in the BUE, BLE  SENSORY:  normal and symmetric to light touch, temperature, vibration, pinprick  COORDINATION:  finger-nose-finger, fine finger movements normal  REFLEXES:  deep tendon reflexes present and symmetric  GAIT/STATION:  narrow based gait     DIAGNOSTIC DATA (LABS, IMAGING, TESTING) - I reviewed patient records, labs, notes, testing and imaging myself where available.  Lab Results  Component Value Date   WBC 8.7 08/23/2022   HGB 11.3 (L) 08/23/2022   HCT 36.1 08/23/2022   MCV 91.6 08/23/2022   PLT 415.0 (H) 08/23/2022      Component Value Date/Time   NA 136 08/23/2022 1608   NA 138 04/29/2020 1007   K 3.7 08/23/2022 1608   CL 103 08/23/2022 1608   CO2 25 08/23/2022 1608   GLUCOSE 109 (H) 08/23/2022 1608   BUN 14 08/23/2022 1608   BUN 7  04/29/2020 1007   CREATININE 0.69 08/23/2022 1608   CREATININE 0.78 12/21/2016 0855   CALCIUM 9.1 08/23/2022 1608   PROT 7.2 08/23/2022 1608   PROT 7.3 04/29/2020 1007   ALBUMIN 4.4 08/23/2022 1608   ALBUMIN 4.6 04/29/2020 1007   AST 25 08/23/2022 1608   ALT 32 08/23/2022 1608   ALKPHOS 48 08/23/2022 1608   BILITOT 0.8 08/23/2022 1608   BILITOT 0.6 04/29/2020 1007   GFRNONAA 84 04/29/2020 1007   GFRNONAA >89 10/14/2014 0944   GFRAA 96 04/29/2020 1007   GFRAA >89 10/14/2014 0944   Lab Results  Component Value Date   CHOL 233 (H) 12/30/2020   HDL 54.60 12/30/2020   LDLCALC 140 (H) 12/30/2020   TRIG 189.0 (H) 12/30/2020   CHOLHDL 4 12/30/2020   Lab Results  Component Value Date   HGBA1C 5.0 12/30/2020   No results found for: "VITAMINB12" Lab Results  Component Value Date   TSH 0.76 08/23/2022    10/22/14 MRI thoracic spine [I reviewed images myself and agree with interpretation. -VRP]  - Multilevel mild disc degeneration without definitive impingement. - Multilevel mild to moderate left foraminal stenoses are noted above.   11/27/14 MRI lumbar spine [I reviewed images myself and agree with interpretation. -VRP]  - Disc bulging and facet hypertrophy L4-5 with mild to moderate spinal stenosis. - Large central disc protrusion L5-S1 with possible extruded disc fragments.  There is severe spinal stenosis with compression of the thecal sac and S1 nerve roots bilaterally.    09/26/14 CT head [I reviewed images myself and agree with interpretation. -VRP]  - Stable and normal noncontrast CT appearance of the brain.  01/20/15 MRI brain  - normal  03/03/15 PSG / split - normal AHI - sleep fragmentation    ASSESSMENT AND PLAN  47 y.o. year old female here with history of recurrent aseptic/viral meningitis since 2008-2013, also with history of migraine type headaches since age 22 years old, never officially diagnosed. Also with recurrent migraine attacks.  Meds tried:  sumatriptan, topiramate, propranolol, aimovig, emgality, ubrelvy, nurtec, rizatriptan  Dx:    Right arm numbness - Plan: NCV with EMG(electromyography)  Numbness and tingling in right hand - Plan: NCV with EMG(electromyography)    PLAN:  RIGHT ARM PAIN / NUMBNESS (digit 4, 5; intermittent) - check EMG/NCS (ulnar neuropathy eval per ortho clinic request)  MIGRAINE WITHOUT AURA (2-3 migraines per month) + CHRONIC DAILY HEADACHES (not controlled) - nurtec 75mg  every other day (migraine prevention) - rizatriptan as needed for breakthrough migraine  ANXIETY / INSOMNIA - consider psychology evaluation - sleep hygiene review  Orders Placed This Encounter  Procedures   NCV with EMG(electromyography)   Return for for NCV/EMG.    Suanne Marker, MD 01/08/2023, 3:45 PM Certified in Neurology, Neurophysiology and Neuroimaging  [facility] [address] [place], [place] [address] [phone]

## 2023-01-16 ENCOUNTER — Encounter: Payer: 59 | Admitting: Emergency Medicine

## 2023-01-23 ENCOUNTER — Ambulatory Visit (INDEPENDENT_AMBULATORY_CARE_PROVIDER_SITE_OTHER): Payer: 59

## 2023-01-23 ENCOUNTER — Ambulatory Visit: Payer: 59 | Admitting: Emergency Medicine

## 2023-01-23 VITALS — BP 138/92 | HR 76 | Temp 98.0°F | Ht 63.0 in | Wt 201.6 lb

## 2023-01-23 DIAGNOSIS — Z130 Encounter for screening for diseases of the blood and blood-forming organs and certain disorders involving the immune mechanism: Secondary | ICD-10-CM | POA: Diagnosis not present

## 2023-01-23 DIAGNOSIS — Z1329 Encounter for screening for other suspected endocrine disorder: Secondary | ICD-10-CM | POA: Diagnosis not present

## 2023-01-23 DIAGNOSIS — Z0001 Encounter for general adult medical examination with abnormal findings: Secondary | ICD-10-CM

## 2023-01-23 DIAGNOSIS — E785 Hyperlipidemia, unspecified: Secondary | ICD-10-CM

## 2023-01-23 DIAGNOSIS — R1012 Left upper quadrant pain: Secondary | ICD-10-CM | POA: Insufficient documentation

## 2023-01-23 DIAGNOSIS — R109 Unspecified abdominal pain: Secondary | ICD-10-CM | POA: Diagnosis not present

## 2023-01-23 DIAGNOSIS — F17200 Nicotine dependence, unspecified, uncomplicated: Secondary | ICD-10-CM | POA: Diagnosis not present

## 2023-01-23 DIAGNOSIS — G894 Chronic pain syndrome: Secondary | ICD-10-CM | POA: Insufficient documentation

## 2023-01-23 DIAGNOSIS — Z13228 Encounter for screening for other metabolic disorders: Secondary | ICD-10-CM | POA: Diagnosis not present

## 2023-01-23 DIAGNOSIS — Z8739 Personal history of other diseases of the musculoskeletal system and connective tissue: Secondary | ICD-10-CM | POA: Insufficient documentation

## 2023-01-23 DIAGNOSIS — Z13220 Encounter for screening for lipoid disorders: Secondary | ICD-10-CM

## 2023-01-23 DIAGNOSIS — Z1231 Encounter for screening mammogram for malignant neoplasm of breast: Secondary | ICD-10-CM

## 2023-01-23 DIAGNOSIS — I10 Essential (primary) hypertension: Secondary | ICD-10-CM

## 2023-01-23 LAB — CBC WITH DIFFERENTIAL/PLATELET
Basophils Absolute: 0.1 10*3/uL (ref 0.0–0.1)
Basophils Relative: 1.4 % (ref 0.0–3.0)
Eosinophils Absolute: 0 10*3/uL (ref 0.0–0.7)
Eosinophils Relative: 0.9 % (ref 0.0–5.0)
HCT: 39.8 % (ref 36.0–46.0)
Hemoglobin: 12.5 g/dL (ref 12.0–15.0)
Lymphocytes Relative: 40.6 % (ref 12.0–46.0)
Lymphs Abs: 2.3 10*3/uL (ref 0.7–4.0)
MCHC: 31.5 g/dL (ref 30.0–36.0)
MCV: 92.2 fL (ref 78.0–100.0)
Monocytes Absolute: 0.4 10*3/uL (ref 0.1–1.0)
Monocytes Relative: 6.5 % (ref 3.0–12.0)
Neutro Abs: 2.9 10*3/uL (ref 1.4–7.7)
Neutrophils Relative %: 50.6 % (ref 43.0–77.0)
Platelets: 351 10*3/uL (ref 150.0–400.0)
RBC: 4.32 Mil/uL (ref 3.87–5.11)
RDW: 14.1 % (ref 11.5–15.5)
WBC: 5.7 10*3/uL (ref 4.0–10.5)

## 2023-01-23 LAB — COMPREHENSIVE METABOLIC PANEL
ALT: 36 U/L — ABNORMAL HIGH (ref 0–35)
AST: 27 U/L (ref 0–37)
Albumin: 4.5 g/dL (ref 3.5–5.2)
Alkaline Phosphatase: 55 U/L (ref 39–117)
BUN: 11 mg/dL (ref 6–23)
CO2: 31 meq/L (ref 19–32)
Calcium: 9.9 mg/dL (ref 8.4–10.5)
Chloride: 101 meq/L (ref 96–112)
Creatinine, Ser: 0.68 mg/dL (ref 0.40–1.20)
GFR: 102.44 mL/min (ref >60.00)
Glucose, Bld: 92 mg/dL (ref 70–99)
Potassium: 4.2 meq/L (ref 3.5–5.1)
Sodium: 138 meq/L (ref 135–145)
Total Bilirubin: 1.1 mg/dL (ref 0.2–1.2)
Total Protein: 7.6 g/dL (ref 6.0–8.3)

## 2023-01-23 LAB — HEMOGLOBIN A1C: Hgb A1c MFr Bld: 5.1 % (ref 4.6–6.5)

## 2023-01-23 LAB — URINALYSIS
Bilirubin Urine: NEGATIVE
Hgb urine dipstick: NEGATIVE
Ketones, ur: NEGATIVE
Leukocytes,Ua: NEGATIVE
Nitrite: NEGATIVE
Specific Gravity, Urine: 1.015 (ref 1.000–1.030)
Total Protein, Urine: NEGATIVE
Urine Glucose: NEGATIVE
Urobilinogen, UA: 0.2 (ref 0.0–1.0)
pH: 8 (ref 5.0–8.0)

## 2023-01-23 LAB — LIPID PANEL
Cholesterol: 180 mg/dL (ref 0–200)
HDL: 80.4 mg/dL (ref >39.00)
LDL Cholesterol: 83 mg/dL (ref 0–99)
NonHDL: 100
Total CHOL/HDL Ratio: 2
Triglycerides: 86 mg/dL (ref 0.0–149.0)
VLDL: 17.2 mg/dL (ref 0.0–40.0)

## 2023-01-23 MED ORDER — TRAMADOL HCL 50 MG PO TABS
50.0000 mg | ORAL_TABLET | Freq: Three times a day (TID) | ORAL | 0 refills | Status: AC | PRN
Start: 2023-01-23 — End: 2023-01-28

## 2023-01-23 NOTE — Assessment & Plan Note (Signed)
Differential diagnosis discussed Most likely musculoskeletal Presently on NSAID and muscle relaxant Recommend CT scan of abdomen and pelvis Tylenol for mild to moderate pain and tramadol for moderate to severe pain

## 2023-01-23 NOTE — Patient Instructions (Signed)

## 2023-01-23 NOTE — Assessment & Plan Note (Signed)
Pain management discussed Has been able to see orthopedic, sports medicine, and rheumatologist in the last couple of months. Recommend pain management clinic evaluation Referral placed today.

## 2023-01-23 NOTE — Assessment & Plan Note (Signed)
Elevated blood pressure reading in the office most likely due to pain Continue valsartan hydrochlorothiazide 80-12.5 mg daily Pain management discussed Tylenol for mild to moderate pain and tramadol for moderate to severe pain Cardiovascular risks associated with uncontrolled hypertension discussed

## 2023-01-23 NOTE — Assessment & Plan Note (Signed)
Chronic stable condition Continue rosuvastatin 10 mg daily Lipid file done today Diet and nutrition discussed

## 2023-01-23 NOTE — Assessment & Plan Note (Signed)
Cardiovascular and cancer risks associated with smoking discussed. °Smoking cessation advice given. °

## 2023-01-23 NOTE — Progress Notes (Signed)
Angela Farrell 47 y.o.   Chief Complaint  Patient presents with   Annual Exam    Patient c/o back pain on left side that moves to front abdominal, pain feels like it coming from under ribs. Has been seen by ortho and sports med for it. Patient states today is worse pain with nausea      HISTORY OF PRESENT ILLNESS: This is a 47 y.o. female here for physical. Has history of hypertension Also has a history of chronic pain.  History of fibromyalgia. Today complaining of intermittent left flank pain radiating to her abdomen for the past several weeks Denies nausea or vomiting.  Able to eat and drink. Denies changes in bowel movements or urinary symptoms. No other complaints or medical concerns today.  HPI   Prior to Admission medications   Medication Sig Start Date End Date Taking? Authorizing Provider  Acetaminophen (TYLENOL 8 HOUR PO) Take by mouth.   Yes [provider]  DULoxetine (CYMBALTA) 60 MG capsule TAKE 1 CAPSULE BY MOUTH EVERY DAY 12/07/22  Yes Richardean Sale, DO  gabapentin (NEURONTIN) 100 MG capsule TAKE 1 CAPSULE (100 MG TOTAL) BY MOUTH THREE TIMES DAILY. 12/25/22  Yes Richardean Sale, DO  Magnesium 500 MG CAPS Take 500 mg by mouth 4 (four) times daily.   Yes [provider]  meloxicam (MOBIC) 15 MG tablet Take 1 tablet (15 mg total) by mouth daily. 01/01/23  Yes Richardean Sale, DO  omega-3 acid ethyl esters (LOVAZA) 1 g capsule Take by mouth 2 (two) times daily.   Yes [provider]  Potassium Gluconate 595 MG CAPS Take 595 mg by mouth daily.   Yes [provider]  Rimegepant Sulfate (NURTEC) 75 MG TBDP Take 75 mg by mouth every other day. 04/18/22  Yes Penumalli, [name], MD  rizatriptan (MAXALT-MLT) 10 MG disintegrating tablet Take 1 tablet (10 mg total) by mouth as needed for migraine (may repeat x 1 tab after 2 hours; max 2 tabs per day or 8 tabs per month). May repeat in 2 hours if needed 04/18/22  Yes Penumalli, [name],  MD  rosuvastatin (CRESTOR) 10 MG tablet TAKE 1 TABLET BY MOUTH EVERY DAY 12/01/22  Yes Kaidan Harpster, Eilleen Kempf, MD  tiZANidine (ZANAFLEX) 4 MG tablet Take 4 mg by mouth every 6 (six) hours as needed. 01/15/23  Yes [provider]  valACYclovir (VALTREX) 500 MG tablet Take 1 tablet (500 mg total) by mouth 2 (two) times daily. 08/15/21  Yes Daiva Eves, Lisette Grinder, MD  valsartan-hydrochlorothiazide (DIOVAN-HCT) 80-12.5 MG tablet TAKE 1 TABLET BY MOUTH EVERY DAY 12/01/22  Yes Cartrell Bentsen, Eilleen Kempf, MD  vitamin A 8000 UNIT capsule Take 8,000 Units by mouth daily.   Yes [provider]  vitamin C (ASCORBIC ACID) 500 MG tablet Take 500 mg by mouth daily.   Yes [provider]  cyclobenzaprine (FLEXERIL) 10 MG tablet Take 1 tablet (10 mg total) by mouth at bedtime as needed for muscle spasms. Patient not taking: Reported on 01/23/2023 01/01/23   Richardean Sale, DO    Allergies  Allergen Reactions   Adhesive [Tape] Rash   Other Rash    Patient Active Problem List   Diagnosis Date Noted   Chronic pain of both hips 07/04/2022   Insomnia 01/02/2022   Dyslipidemia 12/30/2020   History of depression 09/02/2020   Smoker 09/02/2020   Essential hypertension 09/02/2020   Pityriasis versicolor 08/12/2018   Depression 11/03/2014   Anxiety 01/18/2012   Chronic anemia 10/14/2011  Chronic headache disorder 04/18/2011   Obese 04/18/2011   Chronic rhinitis 04/18/2011    Class: Chronic    Past Medical History:  Diagnosis Date   Allergy    Anemia    Anxiety    Arthritis    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 Chevis Pretty   Vaginal spotting 06/04/2019   Vision abnormalities      Past Surgical History:  Procedure Laterality Date   BACK SURGERY  02/09/15   L5 - S1 diskectomy   FOOT SURGERY  s/p 2 procedures   LAPAROSCOPY FOR ECTOPIC PREGNANCY  1998   Fallopian tube removed   MOUTH SURGERY     spinal injection  03/10/15   SPINE SURGERY     UTERINE ARTERY EMBOLIZATION      Social History   Socioeconomic History   Marital status: Married    Spouse name: Jesusita Oka   Number of children: Not on file   Years of education: Not on file   Highest education level: Bachelor's degree (e.g., BA, AB, BS)  Occupational History   Occupation: Chief Financial Officer: [employer]  Tobacco Use   Smoking status: Some Days    Types: Cigarettes   Smokeless tobacco: Never  Vaping Use   Vaping status: Never Used  Substance and Sexual Activity   Alcohol use: Yes    Alcohol/week: 1.0 standard drink of alcohol    Types: 1 Standard drinks or equivalent per week   Drug use: Yes    Types: Marijuana    Comment: per pt "few times a year"   Sexual activity: Yes    Partners: Male    Birth control/protection: Implant    Comment: sexually active w/ female husband  Other Topics Concern   Not on file  Social History Narrative   Drinks about 12-16oz of coffee a day, occasionally drinks a tea.   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    Social Determinants of Health   Financial Resource Strain: Low Risk  (01/23/2023)   Overall Financial Resource Strain (CARDIA)    Difficulty of Paying Living Expenses: Not hard at all  Food Insecurity: No Food Insecurity (01/23/2023)   Hunger Vital Sign    Worried About Running Out of Food in the Last Year: Never true    Ran Out of Food in the Last Year: Never true  Transportation Needs: No Transportation Needs (01/23/2023)   PRAPARE - Administrator, Civil Service (Medical): No    Lack of Transportation (Non-Medical): No  Physical Activity: Sufficiently Active (01/23/2023)   Exercise Vital Sign    Days of Exercise per Week: 5 days     Minutes of Exercise per Session: 60 min  Stress: No Stress Concern Present (01/23/2023)   [employer] of Occupational Health - Occupational Stress Questionnaire    Feeling of Stress : Not at all  Social Connections: Moderately Integrated (01/23/2023)   Social Connection and Isolation Panel [NHANES]    Frequency of Communication with Friends and Family: More than three times a week    Frequency of Social Gatherings with Friends and Family: Once a week  Attends Religious Services: Never    Active Member of Clubs or Organizations: Yes    Attends Banker Meetings: 1 to 4 times per year    Marital Status: Married  Catering manager Violence: Not on file    Family History  Problem Relation Age of Onset   Migraines Father        cluster headache   Hyperlipidemia Father    Hypertension Father    Cancer Mother 88       Breast   Stroke Maternal Grandmother    Diabetes Paternal Grandmother    Diabetes Other        paternal relaitve   Seizures Other        maternal relative   Rheum arthritis Other        maternal relatives   Colon polyps Neg Hx    Esophageal cancer Neg Hx    Stomach cancer Neg Hx    Rectal cancer Neg Hx      Review of Systems  Constitutional: Negative.  Negative for chills and fever.  HENT: Negative.  Negative for congestion and sore throat.   Respiratory: Negative.  Negative for cough and shortness of breath.   Cardiovascular: Negative.  Negative for chest pain and palpitations.  Gastrointestinal:  Positive for abdominal pain. Negative for diarrhea, nausea and vomiting.  Genitourinary:  Positive for flank pain. Negative for dysuria and hematuria.  Musculoskeletal:  Positive for back pain and joint pain.  Skin: Negative.  Negative for rash.  Neurological:  Negative for dizziness and headaches.  All other systems reviewed and are negative.   Vitals:   01/23/23 1039  BP: (!) 138/92  Pulse: 76  Temp: 98 F (36.7 C)  SpO2: 97%     Physical Exam Vitals reviewed.  Constitutional:      Appearance: Normal appearance.  HENT:     Head: Normocephalic.     Right Ear: Tympanic membrane, ear canal and external ear normal.     Left Ear: Tympanic membrane, ear canal and external ear normal.     Mouth/Throat:     Mouth: Mucous membranes are moist.     Pharynx: Oropharynx is clear.  Eyes:     Extraocular Movements: Extraocular movements intact.     Conjunctiva/sclera: Conjunctivae normal.     Pupils: Pupils are equal, round, and reactive to light.  Cardiovascular:     Rate and Rhythm: Normal rate and regular rhythm.     Pulses: Normal pulses.     Heart sounds: Normal heart sounds.  Pulmonary:     Effort: Pulmonary effort is normal.     Breath sounds: Normal breath sounds.  Abdominal:     Palpations: Abdomen is soft.     Tenderness: There is no abdominal tenderness. There is no right CVA tenderness or left CVA tenderness.  Musculoskeletal:     Cervical back: No tenderness.  Lymphadenopathy:     Cervical: No cervical adenopathy.  Skin:    General: Skin is warm and dry.     Capillary Refill: Capillary refill takes less than 2 seconds.  Neurological:     General: No focal deficit present.     Mental Status: She is alert and oriented to person, place, and time.  Psychiatric:        Mood and Affect: Mood normal.        Behavior: Behavior normal.      ASSESSMENT & PLAN: Problem List Items Addressed This Visit       Cardiovascular  and Mediastinum   Essential hypertension    Elevated blood pressure reading in the office most likely due to pain Continue valsartan hydrochlorothiazide 80-12.5 mg daily Pain management discussed Tylenol for mild to moderate pain and tramadol for moderate to severe pain Cardiovascular risks associated with uncontrolled hypertension discussed      Relevant Orders   Comprehensive metabolic panel     Other   Smoker    Cardiovascular and cancer risks associated with smoking  discussed. Smoking cessation advice given      Dyslipidemia    Chronic stable condition Continue rosuvastatin 10 mg daily Lipid file done today Diet and nutrition discussed      Relevant Orders   Comprehensive metabolic panel   Lipid panel   Left flank pain    Differential diagnosis discussed Most likely musculoskeletal Presently on NSAID and muscle relaxant Recommend CT scan of abdomen and pelvis Tylenol for mild to moderate pain and tramadol for moderate to severe pain      Relevant Medications   traMADol (ULTRAM) 50 MG tablet   Other Relevant Orders   CT ABDOMEN PELVIS W WO CONTRAST   CBC with Differential   Chronic pain syndrome    Pain management discussed Has been able to see orthopedic, sports medicine, and rheumatologist in the last couple of months. Recommend pain management clinic evaluation Referral placed today.      Relevant Medications   tiZANidine (ZANAFLEX) 4 MG tablet   traMADol (ULTRAM) 50 MG tablet   Other Relevant Orders   Ambulatory referral to Pain Clinic   History of fibromyalgia   Relevant Medications   traMADol (ULTRAM) 50 MG tablet   Other Relevant Orders   Ambulatory referral to Pain Clinic   Left upper quadrant abdominal pain    Radiating from left flank. Clinically stable.  No red flag signs or symptoms Differential diagnosis discussed Recommend CT scan abdomen and pelvis      Relevant Orders   CT ABDOMEN PELVIS W WO CONTRAST   CBC with Differential   Other Visit Diagnoses     Encounter for general adult medical examination with abnormal findings    -  Primary   Relevant Orders   CBC with Differential   Comprehensive metabolic panel   Hemoglobin A1c   Lipid panel   Mammogram Digital Screening   Visit for screening mammogram       Relevant Orders   Mammogram Digital Screening   Screening for deficiency anemia       Relevant Orders   CBC with Differential   Screening for lipoid disorders       Screening for endocrine,  metabolic and immunity disorder       Relevant Orders   Comprehensive metabolic panel   Hemoglobin A1c        Modifiable risk factors discussed with patient. Anticipatory guidance according to age provided. The following topics were also discussed: Social Determinants of Health Smoking and cardiovascular/cancer risk associated with this condition.  Smoking cessation advice given Diet and nutrition Benefits of exercise Cancer screening and need for breast cancer screening Vaccinations review and recommendations Cardiovascular risk assessment The 10-year ASCVD risk score (Arnett DK, et al., 2019) is: 9.9%   Values used to calculate the score:     Age: 63 years     Sex: Female     Is Non-Hispanic African American: Yes     Diabetic: No     Tobacco smoker: Yes     Systolic Blood Pressure: 138 mmHg  Is BP treated: Yes     HDL Cholesterol: 54.6 mg/dL     Total Cholesterol: 233 mg/dL Review of multiple chronic medical conditions Review of all medications Mental health including depression and anxiety Fall and accident prevention   Patient Instructions  Health Maintenance, Female Adopting a healthy lifestyle and getting preventive care are important in promoting health and wellness. Ask your health care provider about: The right schedule for you to have regular tests and exams. Things you can do on your own to prevent diseases and keep yourself healthy. What should I know about diet, weight, and exercise? Eat a healthy diet  Eat a diet that includes plenty of vegetables, fruits, low-fat dairy products, and lean protein. Do not eat a lot of foods that are high in solid fats, added sugars, or sodium. Maintain a healthy weight Body mass index (BMI) is used to identify weight problems. It estimates body fat based on height and weight. Your health care provider can help determine your BMI and help you achieve or maintain a healthy weight. Get regular exercise Get regular  exercise. This is one of the most important things you can do for your health. Most adults should: Exercise for at least 150 minutes each week. The exercise should increase your heart rate and make you sweat (moderate-intensity exercise). Do strengthening exercises at least twice a week. This is in addition to the moderate-intensity exercise. Spend less time sitting. Even light physical activity can be beneficial. Watch cholesterol and blood lipids Have your blood tested for lipids and cholesterol at 49 years of age, then have this test every 5 years. Have your cholesterol levels checked more often if: Your lipid or cholesterol levels are high. You are older than 49 years of age. You are at high risk for heart disease. What should I know about cancer screening? Depending on your health history and family history, you may need to have cancer screening at various ages. This may include screening for: Breast cancer. Cervical cancer. Colorectal cancer. Skin cancer. Lung cancer. What should I know about heart disease, diabetes, and high blood pressure? Blood pressure and heart disease High blood pressure causes heart disease and increases the risk of stroke. This is more likely to develop in people who have high blood pressure readings or are overweight. Have your blood pressure checked: Every 3-5 years if you are 59-20 years of age. Every year if you are 73 years old or older. Diabetes Have regular diabetes screenings. This checks your fasting blood sugar level. Have the screening done: Once every three years after age 50 if you are at a normal weight and have a low risk for diabetes. More often and at a younger age if you are overweight or have a high risk for diabetes. What should I know about preventing infection? Hepatitis B If you have a higher risk for hepatitis B, you should be screened for this virus. Talk with your health care provider to find out if you are at risk for hepatitis B  infection. Hepatitis C Testing is recommended for: Everyone born from [birth year] through 1967. Anyone with known risk factors for hepatitis C. Sexually transmitted infections (STIs) Get screened for STIs, including gonorrhea and chlamydia, if: You are sexually active and are younger than 49 years of age. You are older than 49 years of age and your health care provider tells you that you are at risk for this type of infection. Your sexual activity has changed since you were last screened, and  you are at increased risk for chlamydia or gonorrhea. Ask your health care provider if you are at risk. Ask your health care provider about whether you are at high risk for HIV. Your health care provider may recommend a prescription medicine to help prevent HIV infection. If you choose to take medicine to prevent HIV, you should first get tested for HIV. You should then be tested every 3 months for as long as you are taking the medicine. Pregnancy If you are about to stop having your period (premenopausal) and you may become pregnant, seek counseling before you get pregnant. Take 400 to 800 micrograms (mcg) of folic acid every day if you become pregnant. Ask for birth control (contraception) if you want to prevent pregnancy. Osteoporosis and menopause Osteoporosis is a disease in which the bones lose minerals and strength with aging. This can result in bone fractures. If you are 75 years old or older, or if you are at risk for osteoporosis and fractures, ask your health care provider if you should: Be screened for bone loss. Take a calcium or vitamin D supplement to lower your risk of fractures. Be given hormone replacement therapy (HRT) to treat symptoms of menopause. Follow these instructions at home: Alcohol use Do not drink alcohol if: Your health care provider tells you not to drink. You are pregnant, may be pregnant, or are planning to become pregnant. If you drink alcohol: Limit how much you have  to: 0-1 drink a day. Know how much alcohol is in your drink. In the [place] one drink equals one 12 oz bottle of beer (355 mL), one 5 oz glass of wine (148 mL), or one 1 oz glass of hard liquor (44 mL). Lifestyle Do not use any products that contain nicotine or tobacco. These products include cigarettes, chewing tobacco, and vaping devices, such as e-cigarettes. If you need help quitting, ask your health care provider. Do not use street drugs. Do not share needles. Ask your health care provider for help if you need support or information about quitting drugs. General instructions Schedule regular health, dental, and eye exams. Stay current with your vaccines. Tell your health care provider if: You often feel depressed. You have ever been abused or do not feel safe at home. Summary Adopting a healthy lifestyle and getting preventive care are important in promoting health and wellness. Follow your health care provider's instructions about healthy diet, exercising, and getting tested or screened for diseases. Follow your health care provider's instructions on monitoring your cholesterol and blood pressure. This information is not intended to replace advice given to you by your health care provider. Make sure you discuss any questions you have with your health care provider. Document Revised: 08/09/2020 Document Reviewed: 08/09/2020 Elsevier Patient Education  2024 Elsevier Inc.     Edwina Barth, MD [facility]

## 2023-01-23 NOTE — Assessment & Plan Note (Signed)
Radiating from left flank. Clinically stable.  No red flag signs or symptoms Differential diagnosis discussed Recommend CT scan abdomen and pelvis

## 2023-01-24 ENCOUNTER — Other Ambulatory Visit: Payer: Self-pay | Admitting: *Deleted

## 2023-01-24 ENCOUNTER — Telehealth: Payer: Self-pay | Admitting: Emergency Medicine

## 2023-01-24 DIAGNOSIS — R109 Unspecified abdominal pain: Secondary | ICD-10-CM

## 2023-01-24 DIAGNOSIS — R1012 Left upper quadrant pain: Secondary | ICD-10-CM

## 2023-01-24 NOTE — Telephone Encounter (Signed)
[facility] called and said that this  patient's CT-abdomen pelvis with and without needs to be changed to without only.  Please revise in epic.

## 2023-01-24 NOTE — Telephone Encounter (Signed)
Okay to change please.  Thanks.

## 2023-01-24 NOTE — Telephone Encounter (Signed)
New order for CT scan placed in EPIC

## 2023-01-28 ENCOUNTER — Other Ambulatory Visit: Payer: Self-pay | Admitting: Emergency Medicine

## 2023-01-28 ENCOUNTER — Other Ambulatory Visit: Payer: Self-pay | Admitting: Sports Medicine

## 2023-01-28 DIAGNOSIS — Z8739 Personal history of other diseases of the musculoskeletal system and connective tissue: Secondary | ICD-10-CM

## 2023-01-28 DIAGNOSIS — R109 Unspecified abdominal pain: Secondary | ICD-10-CM

## 2023-01-28 DIAGNOSIS — G894 Chronic pain syndrome: Secondary | ICD-10-CM

## 2023-02-08 ENCOUNTER — Encounter: Payer: 59 | Admitting: Diagnostic Neuroimaging

## 2023-02-09 ENCOUNTER — Ambulatory Visit
Admission: RE | Admit: 2023-02-09 | Discharge: 2023-02-09 | Disposition: A | Discharge status: Home / Self Care | Payer: 59 | Source: Ambulatory Visit | Attending: Emergency Medicine | Admitting: Emergency Medicine

## 2023-02-09 DIAGNOSIS — R1012 Left upper quadrant pain: Secondary | ICD-10-CM

## 2023-02-09 DIAGNOSIS — R109 Unspecified abdominal pain: Secondary | ICD-10-CM

## 2023-02-15 ENCOUNTER — Ambulatory Visit: Payer: 59 | Admitting: Diagnostic Neuroimaging

## 2023-02-15 ENCOUNTER — Ambulatory Visit: Payer: Self-pay | Admitting: Diagnostic Neuroimaging

## 2023-02-15 DIAGNOSIS — R200 Anesthesia of skin: Secondary | ICD-10-CM

## 2023-02-15 DIAGNOSIS — Z0289 Encounter for other administrative examinations: Secondary | ICD-10-CM

## 2023-02-15 NOTE — Procedures (Signed)
[facility]  NCS (NERVE CONDUCTION STUDY) WITH EMG (ELECTROMYOGRAPHY) REPORT   STUDY DATE: 02/15/23 PATIENT NAME: Angela Farrell DOB: [birth year] MRN: [id number]  ORDERING CLINICIAN: Joycelyn Schmid, MD   TECHNOLOGIST: Jenelle Mages ELECTROMYOGRAPHER: [name]. Kaylynn Chamblin, MD  CLINICAL INFORMATION: 47 year old female with right upper extremity numbness evaluation for ulnar neuropathy.  Also with left arm pain.  FINDINGS: NERVE CONDUCTION STUDY:  Bilateral median and ulnar motor responses are normal.  Right peroneal and right tibial motor responses are normal.  Bilateral ulnar and right tibial F wave latencies are normal.  Bilateral ulnar and left median sensory responses are normal.  Right median sensory sponsor slightly prolonged peak latency and normal amplitude.  Right sural and right superficial peroneal sensory responses are normal.  Right median to ulnar transcarpal comparison study shows prolonged peak latency difference.  Left median to ulnar transcarpal comparison study is normal.   NEEDLE ELECTROMYOGRAPHY:  Needle examination of the left upper extremity, right first dorsal interosseous, bilateral cervical paraspinal muscles is normal.   IMPRESSION:   Mildly abnormal study demonstrating: - Mild right median neuropathy at the wrist consistent mild right carpal tunnel syndrome.    INTERPRETING PHYSICIAN:  Suanne Marker, MD Certified in Neurology, Neurophysiology and Neuroimaging  [facility] [address] [place], [place] [address] [phone]  [facility]    Nerve / Sites Muscle Latency Ref. Amplitude Ref. Rel Amp Segments Distance Velocity Ref. Area    ms ms mV mV %  cm m/s m/s mVms  L Median - APB     Wrist APB 3.1 <=4.4 11.4 >=4.0 100 Wrist - APB 7   46.8     Upper arm APB 7.4  11.3  99.3 Upper arm - Wrist 26 61 >=49 46.4  R Median - APB     Wrist APB 3.5 <=4.4 12.4 >=4.0 100 Wrist - APB 7   43.7      Upper arm APB 7.3  12.0  96.1 Upper arm - Wrist 24 63 >=49 41.9  L Ulnar - ADM     Wrist ADM 2.6 <=3.3 9.9 >=6.0 100 Wrist - ADM 7   36.7     B.Elbow ADM 4.5  8.9  89.9 B.Elbow - Wrist 12.6 66 >=49 35.4     A.Elbow ADM 7.5  9.7  109 A.Elbow - B.Elbow 17.6 60 >=49 38.4  R Ulnar - ADM     Wrist ADM 2.5 <=3.3 10.9 >=6.0 100 Wrist - ADM 7   36.1     B.Elbow ADM 4.5  7.7  70.9 B.Elbow - Wrist 13 65 >=49 28.0     A.Elbow ADM 7.1  9.8  128 A.Elbow - B.Elbow 16 62 >=49 36.1  R Peroneal - EDB     Ankle EDB 4.1 <=6.5 4.2 >=2.0 100 Ankle - EDB 9   12.8     Fib head EDB 9.5  3.9  94.8 Fib head - Ankle 26 48 >=44 12.4     Pop fossa EDB 11.4  3.6  92.1 Pop fossa - Fib head 10.6 56 >=44 11.6         Pop fossa - Ankle      R Tibial - AH     Ankle AH 3.7 <=5.8 5.7 >=4.0 100 Ankle - AH 9   16.6     Pop fossa AH 10.9  4.3  75 Pop fossa - Ankle 36 50 >=41 15.2  SNC    Nerve / Sites Rec. Site Peak Lat Ref.  Amp Ref. Segments Distance Peak Diff Ref.    ms ms V V  cm ms ms  R Sural - Ankle (Calf)     Calf Ankle 3.6 <=4.4 16 >=6 Calf - Ankle 14    R Superficial peroneal - Ankle     Lat leg Ankle 3.6 <=4.4 17 >=6 Lat leg - Ankle 14    L Median, Ulnar - Transcarpal comparison     Median Palm Wrist 2.1 <=2.2 56 >=35 Median Palm - Wrist 8       Ulnar Palm Wrist 1.9 <=2.2 17 >=12 Ulnar Palm - Wrist 8          Median Palm - Ulnar Palm  0.2 <=0.4  R Median, Ulnar - Transcarpal comparison     Median Palm Wrist 2.7 <=2.2 61 >=35 Median Palm - Wrist 8       Ulnar Palm Wrist 2.0 <=2.2 16 >=12 Ulnar Palm - Wrist 8          Median Palm - Ulnar Palm  0.7 <=0.4  L Median - Orthodromic (Dig II, Mid palm)     Dig II Wrist 2.9 <=3.4 20 >=10 Dig II - Wrist 13    R Median - Orthodromic (Dig II, Mid palm)     Dig II Wrist 3.5 <=3.4 17 >=10 Dig II - Wrist 13    L Ulnar - Orthodromic, (Dig V, Mid palm)     Dig V Wrist 2.8 <=3.1 12 >=5 Dig V - Wrist 11    R Ulnar - Orthodromic, (Dig V, Mid palm)     Dig V  Wrist 2.9 <=3.1 9 >=5 Dig V - Wrist 59                       F  Wave    Nerve F Lat Ref.   ms ms  R Tibial - AH 46.5 <=56.0  L Ulnar - ADM 24.9 <=32.0  R Ulnar - ADM 25.1 <=32.0           EMG Summary Table    Spontaneous MUAP Recruitment  Muscle IA Fib PSW Fasc Other Amp Dur. Poly Pattern  L. Deltoid Normal None None None _______ Normal Normal Normal Normal  L. Biceps brachii Normal None None None _______ Normal Normal Normal Normal  L. Triceps brachii Normal None None None _______ Normal Normal Normal Normal  L. Flexor carpi radialis Normal None None None _______ Normal Normal Normal Normal  L. First dorsal interosseous Normal None None None _______ Normal Normal Normal Normal  R. First dorsal interosseous Normal None None None _______ Normal Normal Normal Normal  L. Cervical paraspinals Normal None None None _______ Normal Normal Normal Normal  R. Cervical paraspinals Normal None None None _______ Normal Normal Normal Normal

## 2023-02-19 ENCOUNTER — Encounter: Payer: Self-pay | Admitting: Emergency Medicine

## 2023-02-19 NOTE — Telephone Encounter (Signed)
Report still not available.  Thanks.

## 2023-03-22 ENCOUNTER — Encounter: Payer: 59 | Admitting: Diagnostic Neuroimaging

## 2023-04-20 ENCOUNTER — Other Ambulatory Visit: Payer: Self-pay | Admitting: Diagnostic Neuroimaging

## 2023-04-23 NOTE — Telephone Encounter (Signed)
Last seen on 01/08/23 No follow up scheduled

## 2023-05-14 ENCOUNTER — Ambulatory Visit (INDEPENDENT_AMBULATORY_CARE_PROVIDER_SITE_OTHER): Payer: 59

## 2023-05-14 ENCOUNTER — Ambulatory Visit: Payer: 59 | Admitting: Podiatry

## 2023-05-14 ENCOUNTER — Encounter: Payer: Self-pay | Admitting: Podiatry

## 2023-05-14 DIAGNOSIS — M7752 Other enthesopathy of left foot: Secondary | ICD-10-CM

## 2023-05-14 MED ORDER — TRIAMCINOLONE ACETONIDE 10 MG/ML IJ SUSP
10.0000 mg | Freq: Once | INTRAMUSCULAR | Status: AC
  Administered 2023-05-14: 10 mg via INTRA_ARTICULAR

## 2023-05-15 ENCOUNTER — Other Ambulatory Visit: Payer: Self-pay | Admitting: Diagnostic Neuroimaging

## 2023-05-15 NOTE — Telephone Encounter (Signed)
Last seen on 01/08/23 (office visit) EMG on 02/15/23 No follow up scheduled

## 2023-05-16 NOTE — Progress Notes (Signed)
Subjective:   [name], female   DOB: 48 y.o.   MRN: [id number]   HPI Patient presents stating she is having a lot of pain in her left ankle that is been sore and she was just concerned about nail disease right hallux with some thickness.  States the ankle is very tender.  Patient does not smoke likes to be active but present for several months   Review of Systems  All other systems reviewed and are negative.       Objective:  Physical Exam Vitals and nursing note reviewed.  Constitutional:      Appearance: She is well-developed.  Pulmonary:     Effort: Pulmonary effort is normal.  Musculoskeletal:        General: Normal range of motion.  Skin:    General: Skin is warm.  Neurological:     Mental Status: She is alert.     Neurovascular status intact muscle strength found to be adequate inflammation pain sinus tarsi left fluid buildup noted with pain when I try to invert and evert the foot.  Good digital perfusion well-oriented x 3 slight discoloration of the right nailbed     Assessment:  Inflammatory capsulitis left fluid buildup of the sinus tarsi with slight discoloration right hallux     Plan:  H&P all conditions reviewed sterile prep injected the sinus tarsi left 3 mg Kenalog 5 mg Xylocaine applied fascial brace to limit motion gave instructions on stretching and support reappoint to recheck.  Do not recommend treatment and they will  X-rays were negative for signs of fracture or any indications of arthritic condition

## 2023-05-20 ENCOUNTER — Encounter: Payer: Self-pay | Admitting: Infectious Disease

## 2023-05-20 DIAGNOSIS — B003 Herpesviral meningitis: Secondary | ICD-10-CM

## 2023-05-21 ENCOUNTER — Encounter: Payer: Self-pay | Admitting: Diagnostic Neuroimaging

## 2023-05-21 MED ORDER — VALACYCLOVIR HCL 500 MG PO TABS: 500.0000 mg | ORAL_TABLET | Freq: Two times a day (BID) | ORAL | 2 refills | Status: DC

## 2023-05-23 ENCOUNTER — Other Ambulatory Visit (HOSPITAL_COMMUNITY): Payer: Self-pay

## 2023-05-23 ENCOUNTER — Telehealth: Payer: Self-pay

## 2023-05-23 ENCOUNTER — Telehealth: Payer: Self-pay | Admitting: *Deleted

## 2023-05-23 NOTE — Telephone Encounter (Signed)
 Pt sent mychart that PA Nurtec needed. Please route call back to POD 3 for any response.

## 2023-05-23 NOTE — Telephone Encounter (Signed)
 PA request has been Submitted. New Encounter created for follow up. For additional info see Pharmacy Prior Auth telephone encounter from 05/23/2023.

## 2023-05-23 NOTE — Telephone Encounter (Signed)
 Pharmacy Patient Advocate Encounter   Received notification from Physician's Office that prior authorization for Nurtec 75MG  dispersible tablets is required/requested.   Insurance verification completed.   The patient is insured through [facility] .   Per test claim: PA required; PA submitted to above mentioned insurance via CoverMyMeds Key/confirmation #/EOC [id number] Status is pending

## 2023-05-28 ENCOUNTER — Encounter: Payer: Self-pay | Admitting: Podiatry

## 2023-05-28 ENCOUNTER — Ambulatory Visit: Payer: 59 | Admitting: Podiatry

## 2023-05-28 DIAGNOSIS — M7752 Other enthesopathy of left foot: Secondary | ICD-10-CM

## 2023-05-28 DIAGNOSIS — M7751 Other enthesopathy of right foot: Secondary | ICD-10-CM

## 2023-05-28 MED ORDER — DICLOFENAC SODIUM 75 MG PO TBEC: 75.0000 mg | DELAYED_RELEASE_TABLET | Freq: Two times a day (BID) | ORAL | 2 refills | Status: AC

## 2023-05-30 NOTE — Progress Notes (Signed)
 Subjective:   [name], female   DOB: 48 y.o.   MRN: [id number]   HPI Patient states that she is somewhat improved with the left and right ankle but still gets some discomfort and states that she also is still getting some subtle swelling on the left side   ROS      Objective:  Physical Exam  Neuro vascular status intact with patient found to have improvement in the sinus tarsi bilateral range of motion better patient is not splinting as she was previously with moderate edema on the left ankle nonpitting     Assessment:  Patient is improving still has mild discomfort but overall doing better     Plan:  H&P reviewed conditions discussed possible MRIs or other treatments but at this point I do not recommend any significant treatments at this time.  Patient will be seen back as symptoms indicate all questions answered with ankle compression stocking dispensed left

## 2023-06-05 MED ORDER — NURTEC 75 MG PO TBDP: 1.0000 | ORAL_TABLET | ORAL | 3 refills | Status: DC

## 2023-06-18 ENCOUNTER — Other Ambulatory Visit (HOSPITAL_COMMUNITY): Payer: Self-pay

## 2023-06-18 NOTE — Telephone Encounter (Signed)
 Pharmacy Patient Advocate Encounter  Received notification from [facility] that Prior Authorization for Nurtec has been APPROVED from 05/23/2023 to 05/22/2024   PA #/Case ID/Reference #: PA Case ID #: [id number]

## 2023-06-18 NOTE — Telephone Encounter (Signed)
 Pt informed via mychart

## 2023-07-24 ENCOUNTER — Ambulatory Visit: Payer: 59 | Admitting: Emergency Medicine

## 2023-07-24 VITALS — BP 122/84 | HR 79 | Temp 98.3°F | Ht 63.0 in | Wt 199.0 lb

## 2023-07-24 DIAGNOSIS — E785 Hyperlipidemia, unspecified: Secondary | ICD-10-CM

## 2023-07-24 DIAGNOSIS — G894 Chronic pain syndrome: Secondary | ICD-10-CM | POA: Diagnosis not present

## 2023-07-24 DIAGNOSIS — F17200 Nicotine dependence, unspecified, uncomplicated: Secondary | ICD-10-CM

## 2023-07-24 DIAGNOSIS — I10 Essential (primary) hypertension: Secondary | ICD-10-CM | POA: Diagnosis not present

## 2023-07-24 DIAGNOSIS — E66812 Obesity, class 2: Secondary | ICD-10-CM

## 2023-07-24 DIAGNOSIS — Z8739 Personal history of other diseases of the musculoskeletal system and connective tissue: Secondary | ICD-10-CM

## 2023-07-24 DIAGNOSIS — Z6835 Body mass index (BMI) 35.0-35.9, adult: Secondary | ICD-10-CM

## 2023-07-24 MED ORDER — TIRZEPATIDE-WEIGHT MANAGEMENT 2.5 MG/0.5ML ~~LOC~~ SOLN: 2.5000 mg | SUBCUTANEOUS | 5 refills | Status: DC

## 2023-07-24 MED ORDER — VALSARTAN-HYDROCHLOROTHIAZIDE 160-12.5 MG PO TABS: 1.0000 | ORAL_TABLET | Freq: Every day | ORAL | 3 refills | Status: DC

## 2023-07-24 NOTE — Assessment & Plan Note (Signed)
 Pain management discussed Has been able to see orthopedic, sports medicine, and rheumatologist in the last couple of months.

## 2023-07-24 NOTE — Assessment & Plan Note (Addendum)
 BP Readings from Last 3 Encounters:  07/24/23 122/84  01/23/23 (!) 138/92  01/08/23 131/85  Will increase dose of valsartan  HCT to 160-12.5 mg daily Cardiovascular risks associated with hypertension discussed Diet and nutrition discussed

## 2023-07-24 NOTE — Assessment & Plan Note (Signed)
 Contributing to chronic pain syndrome Pain management discussed

## 2023-07-24 NOTE — Assessment & Plan Note (Signed)
 Diet and nutrition discussed Benefits of exercise discussed Recommend weekly GLP-1 agonist Will prescribe Zepbound

## 2023-07-24 NOTE — Assessment & Plan Note (Signed)
Chronic stable condition Continue rosuvastatin 10 mg daily Lipid file done today Diet and nutrition discussed

## 2023-07-24 NOTE — Assessment & Plan Note (Signed)
 Cardiovascular and cancer risks associated with smoking discussed. Smoking cessation advice given.

## 2023-07-24 NOTE — Patient Instructions (Signed)
 Hypertension, Adult High blood pressure (hypertension) is when the force of blood pumping through the arteries is too strong. The arteries are the blood vessels that carry blood from the heart throughout the body. Hypertension forces the heart to work harder to pump blood and may cause arteries to become narrow or stiff. Untreated or uncontrolled hypertension can lead to a heart attack, heart failure, a stroke, kidney disease, and other problems. A blood pressure reading consists of a higher number over a lower number. Ideally, your blood pressure should be below 120/80. The first ("top") number is called the systolic pressure. It is a measure of the pressure in your arteries as your heart beats. The second ("bottom") number is called the diastolic pressure. It is a measure of the pressure in your arteries as the heart relaxes. What are the causes? The exact cause of this condition is not known. There are some conditions that result in high blood pressure. What increases the risk? Certain factors may make you more likely to develop high blood pressure. Some of these risk factors are under your control, including: Smoking. Not getting enough exercise or physical activity. Being overweight. Having too much fat, sugar, calories, or salt (sodium) in your diet. Drinking too much alcohol. Other risk factors include: Having a personal history of heart disease, diabetes, high cholesterol, or kidney disease. Stress. Having a family history of high blood pressure and high cholesterol. Having obstructive sleep apnea. Age. The risk increases with age. What are the signs or symptoms? High blood pressure may not cause symptoms. Very high blood pressure (hypertensive crisis) may cause: Headache. Fast or irregular heartbeats (palpitations). Shortness of breath. Nosebleed. Nausea and vomiting. Vision changes. Severe chest pain, dizziness, and seizures. How is this diagnosed? This condition is diagnosed by  measuring your blood pressure while you are seated, with your arm resting on a flat surface, your legs uncrossed, and your feet flat on the floor. The cuff of the blood pressure monitor will be placed directly against the skin of your upper arm at the level of your heart. Blood pressure should be measured at least twice using the same arm. Certain conditions can cause a difference in blood pressure between your right and left arms. If you have a high blood pressure reading during one visit or you have normal blood pressure with other risk factors, you may be asked to: Return on a different day to have your blood pressure checked again. Monitor your blood pressure at home for 1 week or longer. If you are diagnosed with hypertension, you may have other blood or imaging tests to help your health care provider understand your overall risk for other conditions. How is this treated? This condition is treated by making healthy lifestyle changes, such as eating healthy foods, exercising more, and reducing your alcohol intake. You may be referred for counseling on a healthy diet and physical activity. Your health care provider may prescribe medicine if lifestyle changes are not enough to get your blood pressure under control and if: Your systolic blood pressure is above 130. Your diastolic blood pressure is above 80. Your personal target blood pressure may vary depending on your medical conditions, your age, and other factors. Follow these instructions at home: Eating and drinking  Eat a diet that is high in fiber and potassium, and low in sodium, added sugar, and fat. An example of this eating plan is called the DASH diet. DASH stands for Dietary Approaches to Stop Hypertension. To eat this way: Eat  plenty of fresh fruits and vegetables. Try to fill one half of your plate at each meal with fruits and vegetables. Eat whole grains, such as whole-wheat pasta, brown rice, or whole-grain bread. Fill about one  fourth of your plate with whole grains. Eat or drink low-fat dairy products, such as skim milk or low-fat yogurt. Avoid fatty cuts of meat, processed or cured meats, and poultry with skin. Fill about one fourth of your plate with lean proteins, such as fish, chicken without skin, beans, eggs, or tofu. Avoid pre-made and processed foods. These tend to be higher in sodium, added sugar, and fat. Reduce your daily sodium intake. Many people with hypertension should eat less than 1,500 mg of sodium a day. Do not drink alcohol if: Your health care provider tells you not to drink. You are pregnant, may be pregnant, or are planning to become pregnant. If you drink alcohol: Limit how much you have to: 0-1 drink a day for women. 0-2 drinks a day for men. Know how much alcohol is in your drink. In the [place] one drink equals one 12 oz bottle of beer (355 mL), one 5 oz glass of wine (148 mL), or one 1 oz glass of hard liquor (44 mL). Lifestyle  Work with your health care provider to maintain a healthy body weight or to lose weight. Ask what an ideal weight is for you. Get at least 30 minutes of exercise that causes your heart to beat faster (aerobic exercise) most days of the week. Activities may include walking, swimming, or biking. Include exercise to strengthen your muscles (resistance exercise), such as Pilates or lifting weights, as part of your weekly exercise routine. Try to do these types of exercises for 30 minutes at least 3 days a week. Do not use any products that contain nicotine or tobacco. These products include cigarettes, chewing tobacco, and vaping devices, such as e-cigarettes. If you need help quitting, ask your health care provider. Monitor your blood pressure at home as told by your health care provider. Keep all follow-up visits. This is important. Medicines Take over-the-counter and prescription medicines only as told by your health care provider. Follow directions carefully. Blood  pressure medicines must be taken as prescribed. Do not skip doses of blood pressure medicine. Doing this puts you at risk for problems and can make the medicine less effective. Ask your health care provider about side effects or reactions to medicines that you should watch for. Contact a health care provider if you: Think you are having a reaction to a medicine you are taking. Have headaches that keep coming back (recurring). Feel dizzy. Have swelling in your ankles. Have trouble with your vision. Get help right away if you: Develop a severe headache or confusion. Have unusual weakness or numbness. Feel faint. Have severe pain in your chest or abdomen. Vomit repeatedly. Have trouble breathing. These symptoms may be an emergency. Get help right away. Call 911. Do not wait to see if the symptoms will go away. Do not drive yourself to the hospital. Summary Hypertension is when the force of blood pumping through your arteries is too strong. If this condition is not controlled, it may put you at risk for serious complications. Your personal target blood pressure may vary depending on your medical conditions, your age, and other factors. For most people, a normal blood pressure is less than 120/80. Hypertension is treated with lifestyle changes, medicines, or a combination of both. Lifestyle changes include losing weight, eating a healthy,  low-sodium diet, exercising more, and limiting alcohol. This information is not intended to replace advice given to you by your health care provider. Make sure you discuss any questions you have with your health care provider. Document Revised: 01/25/2021 Document Reviewed: 01/25/2021 Elsevier Patient Education  2024 [employer].

## 2023-07-24 NOTE — Progress Notes (Signed)
 Angela Farrell 48 y.o.   Chief Complaint  Patient presents with   Follow-up    Patient here for 6 month f/u for HTN. Patient mentions still having chronic pain, and she mentions she has doubled up her dose in her bp medications. She also wants to talk about a few other things with provider     HISTORY OF PRESENT ILLNESS: This is a 48 y.o. female A1A here for 51-month follow-up of hypertension Was taking valsartan  HCT 80-12.5 mg daily.  Blood pressure readings at home elevated.  Started taking 2 tablets a day with good results. Also has the following concerns: 1.  Persistent chronic pain 2.  Menopausal symptoms.  Was able to follow-up with gynecologist.  Trying to avoid hormone replacement therapy due to hypertension Was started on venlafaxine but not working very well.  Also tried Veozah in the past without results. 3.  Inability to lose weight.  Interested in starting GLP-1 agonist.   HPI   Prior to Admission medications   Medication Sig Start Date End Date Taking? Authorizing Provider  Acetaminophen  (TYLENOL  8 HOUR PO) Take by mouth.   Yes [provider]  diclofenac  (VOLTAREN ) 75 MG EC tablet Take 1 tablet (75 mg total) by mouth 2 (two) times daily. 05/28/23  Yes RegalAngus Kenning, DPM  Magnesium 500 MG CAPS Take 500 mg by mouth 4 (four) times daily.   Yes [provider]  omega-3 acid ethyl esters (LOVAZA) 1 g capsule Take by mouth 2 (two) times daily.   Yes [provider]  Potassium Gluconate 595 MG CAPS Take 595 mg by mouth daily.   Yes [provider]  Rimegepant Sulfate (NURTEC) 75 MG TBDP Take 1 tablet (75 mg total) by mouth every other day. 06/05/23  Yes Penumalli, Vikram R, MD  rizatriptan  (MAXALT -MLT) 10 MG disintegrating tablet TAKE 1 TABLET BY MOUTH AS NEEDED FOR MIGRAINE (MAY REPEAT X 1 TAB AFTER 2 HOURS IF NEEDED MAX 2 TABS PER DAY OR 8 TABS PER MONTH). 04/23/23  Yes Penumalli, Vikram R, MD  rosuvastatin  (CRESTOR ) 10 MG tablet TAKE 1  TABLET BY MOUTH EVERY DAY 12/01/22  Yes Ladaisha Portillo, Isidro Margo, MD  valACYclovir  (VALTREX ) 500 MG tablet Take 1 tablet (500 mg total) by mouth 2 (two) times daily. 05/21/23  Yes Ernie Heal, Jerelyn Money, MD  valsartan -hydrochlorothiazide  (DIOVAN -HCT) 80-12.5 MG tablet TAKE 1 TABLET BY MOUTH EVERY DAY 12/01/22  Yes Cacey Willow Jose, MD  vitamin A 8000 UNIT capsule Take 8,000 Units by mouth daily.   Yes [provider]  vitamin C (ASCORBIC ACID) 500 MG tablet Take 500 mg by mouth daily.   Yes [provider]  meloxicam  (MOBIC ) 15 MG tablet Take 1 tablet (15 mg total) by mouth daily. 01/01/23   Ulysees Gander, DO  tiZANidine  (ZANAFLEX ) 4 MG tablet Take 4 mg by mouth every 6 (six) hours as needed. 01/15/23   [provider]    Allergies  Allergen Reactions   Adhesive [Tape] Rash   Other Rash    Patient Active Problem List   Diagnosis Date Noted   Left flank pain 01/23/2023   Chronic pain syndrome 01/23/2023   History of fibromyalgia 01/23/2023   Left upper quadrant abdominal pain 01/23/2023   Chronic pain of both hips 07/04/2022   Insomnia 01/02/2022   Dyslipidemia 12/30/2020   History of depression 09/02/2020   Smoker 09/02/2020   Essential hypertension 09/02/2020   Depression 11/03/2014   Anxiety 01/18/2012   Chronic anemia 10/14/2011  Chronic headache disorder 04/18/2011   Obese 04/18/2011   Chronic rhinitis 04/18/2011    Class: Chronic    Past Medical History:  Diagnosis Date   Allergy    Anemia    Anxiety    Arthritis    Benign recurrent meningitis (mollaret) 11/18/2018   Chronic headache disorder    s/p Meningitis x 2, tension- type plus allergic/sinus problems   COVID 08/16/2020   COVID-19 05/2020   History of abnormal Pap smear    Meningitis, viral At age 63 and at age 74   Nasal fracture 2011   Other and unspecified ovarian cysts 2011- pelvic CT   GYN- Dr. Cathe Clore   Vaginal spotting 06/04/2019   Vision abnormalities     Past Surgical  History:  Procedure Laterality Date   BACK SURGERY  02/09/15   L5 - S1 diskectomy   FOOT SURGERY  s/p 2 procedures   LAPAROSCOPY FOR ECTOPIC PREGNANCY  1998   Fallopian tube removed   MOUTH SURGERY     spinal injection  03/10/15   SPINE SURGERY     UTERINE ARTERY EMBOLIZATION      Social History   Socioeconomic History   Marital status: Married    Spouse name: Genella Kendall   Number of children: Not on file   Years of education: Not on file   Highest education level: Bachelor's degree (e.g., BA, AB, BS)  Occupational History   Occupation: Chief Financial Officer: [employer]  Tobacco Use   Smoking status: Some Days    Types: Cigarettes   Smokeless tobacco: Never  Vaping Use   Vaping status: Never Used  Substance and Sexual Activity   Alcohol use: Yes    Alcohol/week: 1.0 standard drink of alcohol    Types: 1 Standard drinks or equivalent per week   Drug use: Yes    Types: Marijuana    Comment: per pt "few times a year"   Sexual activity: Yes    Partners: Male    Birth control/protection: Implant    Comment: sexually active w/ female husband  Other Topics Concern   Not on file  Social History Narrative   Drinks about 12-16oz of coffee a day, occasionally drinks a tea.   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    Social Drivers of Health   Financial Resource Strain: Low Risk  (07/24/2023)   Overall Financial Resource Strain (CARDIA)    Difficulty of Paying Living Expenses: Not hard at all  Food Insecurity: No Food Insecurity (07/24/2023)   Hunger Vital Sign    Worried About Running Out of Food in the Last Year: Never true    Ran Out of Food in the Last Year: Never true  Transportation Needs: No Transportation Needs (07/24/2023)   PRAPARE - Administrator, Civil Service (Medical): No    Lack of Transportation (Non-Medical): No  Physical Activity: Sufficiently Active (07/24/2023)   Exercise Vital Sign    Days of Exercise per Week: 5 days    Minutes of Exercise per  Session: 50 min  Stress: Stress Concern Present (07/24/2023)   [employer] of Occupational Health - Occupational Stress Questionnaire    Feeling of Stress : To some extent  Social Connections: Moderately Integrated (07/24/2023)   Social Connection and Isolation Panel [NHANES]    Frequency of Communication with Friends and Family: More than three times a week    Frequency of Social Gatherings with Friends and Family: Never    Attends Religious  Services: Never    Database administrator or Organizations: Yes    Attends Engineer, structural: More than 4 times per year    Marital Status: Married  Catering manager Violence: Not on file    Family History  Problem Relation Age of Onset   Migraines Father        cluster headache   Hyperlipidemia Father    Hypertension Father    Cancer Mother 87       Breast   Stroke Maternal Grandmother    Diabetes Paternal Grandmother    Diabetes Other        paternal relaitve   Seizures Other        maternal relative   Rheum arthritis Other        maternal relatives   Colon polyps Neg Hx    Esophageal cancer Neg Hx    Stomach cancer Neg Hx    Rectal cancer Neg Hx      Review of Systems  Constitutional: Negative.  Negative for chills and fever.  HENT: Negative.  Negative for congestion and sore throat.   Respiratory: Negative.  Negative for cough and shortness of breath.   Cardiovascular:  Negative for chest pain and palpitations.  Gastrointestinal:  Negative for abdominal pain, diarrhea, nausea and vomiting.  Genitourinary: Negative.  Negative for dysuria and hematuria.  Skin: Negative.  Negative for rash.  Neurological: Negative.  Negative for dizziness and headaches.  All other systems reviewed and are negative.   Vitals:   07/24/23 1408  BP: 122/84  Pulse: 79  Temp: 98.3 F (36.8 C)  SpO2: 100%    Physical Exam Vitals reviewed.  Constitutional:      Appearance: Normal appearance.  HENT:     Head: Normocephalic.      Mouth/Throat:     Mouth: Mucous membranes are moist.     Pharynx: Oropharynx is clear.  Eyes:     Extraocular Movements: Extraocular movements intact.  Cardiovascular:     Rate and Rhythm: Normal rate and regular rhythm.     Pulses: Normal pulses.     Heart sounds: Normal heart sounds.  Pulmonary:     Effort: Pulmonary effort is normal.     Breath sounds: Normal breath sounds.  Musculoskeletal:     Cervical back: No tenderness.  Lymphadenopathy:     Cervical: No cervical adenopathy.  Skin:    General: Skin is warm and dry.     Capillary Refill: Capillary refill takes less than 2 seconds.  Neurological:     General: No focal deficit present.     Mental Status: She is alert and oriented to person, place, and time.  Psychiatric:        Mood and Affect: Mood normal.        Behavior: Behavior normal.      ASSESSMENT & PLAN: A total of 45 minutes was spent with the patient and counseling/coordination of care regarding preparing for this visit, review of most recent office visit notes, review of multiple chronic medical conditions and their management, cardiovascular risks associated with hypertension and obesity, review of all medications and changes made, review of most recent bloodwork results, review of health maintenance items, education on nutrition, prognosis, documentation, and need for follow up.   Problem List Items Addressed This Visit       Cardiovascular and Mediastinum   Essential hypertension - Primary   BP Readings from Last 3 Encounters:  07/24/23 122/84  01/23/23 (!) 138/92  01/08/23 131/85  Will increase dose of valsartan  HCT to 160-12.5 mg daily Cardiovascular risks associated with hypertension discussed Diet and nutrition discussed       Relevant Medications   valsartan -hydrochlorothiazide  (DIOVAN -HCT) 160-12.5 MG tablet     Other   Obese   Diet and nutrition discussed Benefits of exercise discussed Recommend weekly GLP-1 agonist Will  prescribe Zepbound      Relevant Medications   tirzepatide (ZEPBOUND) 2.5 MG/0.5ML injection vial   Smoker   Cardiovascular and cancer risks associated with smoking discussed. Smoking cessation advice given      Dyslipidemia   Chronic stable condition Continue rosuvastatin  10 mg daily Lipid file done today Diet and nutrition discussed      Chronic pain syndrome   Pain management discussed Has been able to see orthopedic, sports medicine, and rheumatologist in the last couple of months.       History of fibromyalgia   Contributing to chronic pain syndrome Pain management discussed      Patient Instructions  Hypertension, Adult High blood pressure (hypertension) is when the force of blood pumping through the arteries is too strong. The arteries are the blood vessels that carry blood from the heart throughout the body. Hypertension forces the heart to work harder to pump blood and may cause arteries to become narrow or stiff. Untreated or uncontrolled hypertension can lead to a heart attack, heart failure, a stroke, kidney disease, and other problems. A blood pressure reading consists of a higher number over a lower number. Ideally, your blood pressure should be below 120/80. The first ("top") number is called the systolic pressure. It is a measure of the pressure in your arteries as your heart beats. The second ("bottom") number is called the diastolic pressure. It is a measure of the pressure in your arteries as the heart relaxes. What are the causes? The exact cause of this condition is not known. There are some conditions that result in high blood pressure. What increases the risk? Certain factors may make you more likely to develop high blood pressure. Some of these risk factors are under your control, including: Smoking. Not getting enough exercise or physical activity. Being overweight. Having too much fat, sugar, calories, or salt (sodium) in your diet. Drinking too much  alcohol. Other risk factors include: Having a personal history of heart disease, diabetes, high cholesterol, or kidney disease. Stress. Having a family history of high blood pressure and high cholesterol. Having obstructive sleep apnea. Age. The risk increases with age. What are the signs or symptoms? High blood pressure may not cause symptoms. Very high blood pressure (hypertensive crisis) may cause: Headache. Fast or irregular heartbeats (palpitations). Shortness of breath. Nosebleed. Nausea and vomiting. Vision changes. Severe chest pain, dizziness, and seizures. How is this diagnosed? This condition is diagnosed by measuring your blood pressure while you are seated, with your arm resting on a flat surface, your legs uncrossed, and your feet flat on the floor. The cuff of the blood pressure monitor will be placed directly against the skin of your upper arm at the level of your heart. Blood pressure should be measured at least twice using the same arm. Certain conditions can cause a difference in blood pressure between your right and left arms. If you have a high blood pressure reading during one visit or you have normal blood pressure with other risk factors, you may be asked to: Return on a different day to have your blood pressure checked again. Monitor your blood pressure  at home for 1 week or longer. If you are diagnosed with hypertension, you may have other blood or imaging tests to help your health care provider understand your overall risk for other conditions. How is this treated? This condition is treated by making healthy lifestyle changes, such as eating healthy foods, exercising more, and reducing your alcohol intake. You may be referred for counseling on a healthy diet and physical activity. Your health care provider may prescribe medicine if lifestyle changes are not enough to get your blood pressure under control and if: Your systolic blood pressure is above 130. Your  diastolic blood pressure is above 80. Your personal target blood pressure may vary depending on your medical conditions, your age, and other factors. Follow these instructions at home: Eating and drinking  Eat a diet that is high in fiber and potassium, and low in sodium, added sugar, and fat. An example of this eating plan is called the DASH diet. DASH stands for Dietary Approaches to Stop Hypertension. To eat this way: Eat plenty of fresh fruits and vegetables. Try to fill one half of your plate at each meal with fruits and vegetables. Eat whole grains, such as whole-wheat pasta, brown rice, or whole-grain bread. Fill about one fourth of your plate with whole grains. Eat or drink low-fat dairy products, such as skim milk or low-fat yogurt. Avoid fatty cuts of meat, processed or cured meats, and poultry with skin. Fill about one fourth of your plate with lean proteins, such as fish, chicken without skin, beans, eggs, or tofu. Avoid pre-made and processed foods. These tend to be higher in sodium, added sugar, and fat. Reduce your daily sodium intake. Many people with hypertension should eat less than 1,500 mg of sodium a day. Do not drink alcohol if: Your health care provider tells you not to drink. You are pregnant, may be pregnant, or are planning to become pregnant. If you drink alcohol: Limit how much you have to: 0-1 drink a day for women. 0-2 drinks a day for men. Know how much alcohol is in your drink. In the [place] one drink equals one 12 oz bottle of beer (355 mL), one 5 oz glass of wine (148 mL), or one 1 oz glass of hard liquor (44 mL). Lifestyle  Work with your health care provider to maintain a healthy body weight or to lose weight. Ask what an ideal weight is for you. Get at least 30 minutes of exercise that causes your heart to beat faster (aerobic exercise) most days of the week. Activities may include walking, swimming, or biking. Include exercise to strengthen your muscles  (resistance exercise), such as Pilates or lifting weights, as part of your weekly exercise routine. Try to do these types of exercises for 30 minutes at least 3 days a week. Do not use any products that contain nicotine or tobacco. These products include cigarettes, chewing tobacco, and vaping devices, such as e-cigarettes. If you need help quitting, ask your health care provider. Monitor your blood pressure at home as told by your health care provider. Keep all follow-up visits. This is important. Medicines Take over-the-counter and prescription medicines only as told by your health care provider. Follow directions carefully. Blood pressure medicines must be taken as prescribed. Do not skip doses of blood pressure medicine. Doing this puts you at risk for problems and can make the medicine less effective. Ask your health care provider about side effects or reactions to medicines that you should watch for. Contact a  health care provider if you: Think you are having a reaction to a medicine you are taking. Have headaches that keep coming back (recurring). Feel dizzy. Have swelling in your ankles. Have trouble with your vision. Get help right away if you: Develop a severe headache or confusion. Have unusual weakness or numbness. Feel faint. Have severe pain in your chest or abdomen. Vomit repeatedly. Have trouble breathing. These symptoms may be an emergency. Get help right away. Call 911. Do not wait to see if the symptoms will go away. Do not drive yourself to the hospital. Summary Hypertension is when the force of blood pumping through your arteries is too strong. If this condition is not controlled, it may put you at risk for serious complications. Your personal target blood pressure may vary depending on your medical conditions, your age, and other factors. For most people, a normal blood pressure is less than 120/80. Hypertension is treated with lifestyle changes, medicines, or a  combination of both. Lifestyle changes include losing weight, eating a healthy, low-sodium diet, exercising more, and limiting alcohol. This information is not intended to replace advice given to you by your health care provider. Make sure you discuss any questions you have with your health care provider. Document Revised: 01/25/2021 Document Reviewed: 01/25/2021 Elsevier Patient Education  2024 Elsevier Inc.    Maryagnes Small, MD [facility]

## 2023-07-25 ENCOUNTER — Other Ambulatory Visit (HOSPITAL_COMMUNITY): Payer: Self-pay

## 2023-07-25 ENCOUNTER — Encounter: Payer: Self-pay | Admitting: Radiology

## 2023-07-25 ENCOUNTER — Telehealth: Payer: Self-pay | Admitting: Pharmacy Technician

## 2023-07-25 NOTE — Telephone Encounter (Signed)
 Pharmacy Patient Advocate Encounter  Received notification from [facility] that Prior Authorization for Zepbound 2.5MG /0.5ML solution has been APPROVED from 07/25/2023 to 01/24/2024. Ran test claim, Copay is $50.00. This test claim was processed through [facility]- copay amounts may vary at other pharmacies due to pharmacy/plan contracts, or as the patient moves through the different stages of their insurance plan.   PA #/Case ID/Reference #: [id number]

## 2023-07-25 NOTE — Telephone Encounter (Signed)
 Pharmacy Patient Advocate Encounter   Received notification from Onbase that prior authorization for Zepbound 2.5MG /0.5ML solution is required/requested.   Insurance verification completed.   The patient is insured through [facility] .   Per test claim: PA required; PA submitted to above mentioned insurance via CoverMyMeds Key/confirmation #/EOC [id number] Status is pending

## 2023-07-27 ENCOUNTER — Other Ambulatory Visit: Payer: Self-pay | Admitting: Infectious Disease

## 2023-07-27 DIAGNOSIS — B003 Herpesviral meningitis: Secondary | ICD-10-CM

## 2023-07-30 NOTE — Telephone Encounter (Signed)
 [facility] to refill

## 2023-09-10 ENCOUNTER — Other Ambulatory Visit: Payer: Self-pay | Admitting: Diagnostic Neuroimaging

## 2023-09-15 ENCOUNTER — Other Ambulatory Visit: Payer: Self-pay | Admitting: Diagnostic Neuroimaging

## 2023-09-17 ENCOUNTER — Other Ambulatory Visit: Payer: Self-pay | Admitting: Emergency Medicine

## 2023-09-17 ENCOUNTER — Telehealth: Payer: Self-pay

## 2023-09-17 ENCOUNTER — Other Ambulatory Visit (HOSPITAL_COMMUNITY): Payer: Self-pay

## 2023-09-17 DIAGNOSIS — E66812 Obesity, class 2: Secondary | ICD-10-CM

## 2023-09-17 NOTE — Telephone Encounter (Signed)
 Ozempic/Mounjaro  is approved exclusively as an adjunct to diet and exercise to improve glycemic  control in adults with type 2 diabetes mellitus. A review of patient's medical chart reveals no  documented diagnosis of type 2 diabetes or an A1C indicative of diabetes. Therefore, they do not  currently meet the criteria for prior authorization of this medication. If clinically appropriate, alternative  options such as Saxenda, Zepbound , or Frederik Jansky may be considered for this patient.  Patient already has approval for Zepbound 

## 2023-09-17 NOTE — Telephone Encounter (Signed)
 Patient was approved for Zepbound  so she should start taking this medication.  Thanks.

## 2023-09-24 ENCOUNTER — Telehealth: Payer: Self-pay | Admitting: Emergency Medicine

## 2023-09-24 NOTE — Telephone Encounter (Signed)
 Copied from CRM [id number]. Topic: Clinical - Prescription Issue >> Sep 24, 2023  3:29 PM [place] wrote: Reason for CRM: Patient was taking Zepbound  and it got switched to Mounjaro  and she does not know why. She is calling to get a refill of Zepbound 

## 2023-09-26 ENCOUNTER — Other Ambulatory Visit: Payer: Self-pay | Admitting: Radiology

## 2023-09-26 ENCOUNTER — Encounter: Payer: Self-pay | Admitting: Emergency Medicine

## 2023-09-26 DIAGNOSIS — E66812 Obesity, class 2: Secondary | ICD-10-CM

## 2023-09-26 MED ORDER — TIRZEPATIDE-WEIGHT MANAGEMENT 5 MG/0.5ML ~~LOC~~ SOLN: 5.0000 mg | SUBCUTANEOUS | 1 refills | Status: DC

## 2023-09-28 ENCOUNTER — Telehealth: Payer: Self-pay

## 2023-09-28 ENCOUNTER — Other Ambulatory Visit: Payer: Self-pay | Admitting: Radiology

## 2023-09-28 ENCOUNTER — Other Ambulatory Visit (HOSPITAL_COMMUNITY): Payer: Self-pay

## 2023-09-28 NOTE — Telephone Encounter (Signed)
 Patient per chart notes is on Zepbound .  Pharmacy Patient Advocate Encounter   Received notification from CoverMyMeds that prior authorization for Mounjaro  5 is required/requested.   Insurance verification completed.   The patient is insured through [facility] .   Per test claim: patient is on Zepbound  not Mounjaro . Ran test claim for Zepbound  and its refill too soon. Last fill 09/27/23 next fill 10/18/23

## 2023-10-18 ENCOUNTER — Other Ambulatory Visit: Payer: Self-pay | Admitting: Emergency Medicine

## 2023-10-18 DIAGNOSIS — E785 Hyperlipidemia, unspecified: Secondary | ICD-10-CM

## 2023-10-23 ENCOUNTER — Other Ambulatory Visit: Payer: Self-pay | Admitting: Emergency Medicine

## 2023-10-23 ENCOUNTER — Ambulatory Visit: Admitting: Emergency Medicine

## 2023-10-23 ENCOUNTER — Encounter: Payer: Self-pay | Admitting: Emergency Medicine

## 2023-10-23 VITALS — BP 114/80 | HR 94 | Temp 97.9°F | Ht 63.0 in | Wt 185.0 lb

## 2023-10-23 DIAGNOSIS — G894 Chronic pain syndrome: Secondary | ICD-10-CM | POA: Diagnosis not present

## 2023-10-23 DIAGNOSIS — I10 Essential (primary) hypertension: Secondary | ICD-10-CM

## 2023-10-23 DIAGNOSIS — F17200 Nicotine dependence, unspecified, uncomplicated: Secondary | ICD-10-CM

## 2023-10-23 DIAGNOSIS — Z6835 Body mass index (BMI) 35.0-35.9, adult: Secondary | ICD-10-CM

## 2023-10-23 DIAGNOSIS — E785 Hyperlipidemia, unspecified: Secondary | ICD-10-CM | POA: Diagnosis not present

## 2023-10-23 DIAGNOSIS — E66812 Obesity, class 2: Secondary | ICD-10-CM

## 2023-10-23 MED ORDER — TIRZEPATIDE-WEIGHT MANAGEMENT 7.5 MG/0.5ML ~~LOC~~ SOLN: 7.5000 mg | SUBCUTANEOUS | 3 refills | Status: DC

## 2023-10-23 MED ORDER — TIRZEPATIDE-WEIGHT MANAGEMENT 7.5 MG/0.5ML ~~LOC~~ SOAJ: 7.5000 mg | SUBCUTANEOUS | 3 refills | Status: DC

## 2023-10-23 NOTE — Assessment & Plan Note (Signed)
 BP Readings from Last 3 Encounters:  10/23/23 114/80  07/24/23 122/84  01/23/23 (!) 138/92  Well-controlled hypertension Continue valsartan  HCT 160-12.5 mg daily Cardiovascular risks associated with hypertension discussed Diet and nutrition discussed

## 2023-10-23 NOTE — Assessment & Plan Note (Signed)
 Eating better and losing weight Tolerating Zepbound  very well No significant side effects Recommend to continue Zepbound  at 7.5 mg weekly Diet and nutrition discussed

## 2023-10-23 NOTE — Telephone Encounter (Signed)
 New prescription sent

## 2023-10-23 NOTE — Patient Instructions (Signed)

## 2023-10-23 NOTE — Assessment & Plan Note (Signed)
 Stable and well-controlled Pain management discussed Has been able to see orthopedic, sports medicine, and rheumatologist in the last couple of months.

## 2023-10-23 NOTE — Assessment & Plan Note (Signed)
Chronic stable condition Continue rosuvastatin 10 mg daily Lipid file done today Diet and nutrition discussed

## 2023-10-23 NOTE — Progress Notes (Signed)
 Angela Farrell 48 y.o.   Chief Complaint  Patient presents with   Follow-up    Ot has no concerns and feels that the zepbound  is working     HISTORY OF PRESENT ILLNESS: This is a 48 y.o. female here for 80-month follow-up of chronic medical conditions Hypertension under control Losing weight on Zepbound .  No significant side effects No other complaints or medical concerns today. Wt Readings from Last 3 Encounters:  07/24/23 199 lb (90.3 kg)  01/23/23 201 lb 9.6 oz (91.4 kg)  01/08/23 206 lb (93.4 kg)     HPI   Prior to Admission medications   Medication Sig Start Date End Date Taking? Authorizing Provider  Acetaminophen  (TYLENOL  8 HOUR PO) Take by mouth.   Yes [provider]  diclofenac  (VOLTAREN ) 75 MG EC tablet Take 1 tablet (75 mg total) by mouth 2 (two) times daily. 05/28/23  Yes Regal, Pas[place] RAMAN, DPM  estradiol (VIVELLE-DOT) 0.05 MG/24HR patch Place 1 patch onto the skin 2 (two) times a week. 10/06/23  Yes [provider]  estradiol (VIVELLE-DOT) 0.075 MG/24HR APPLY 1 PATCH TRANSDERMALLY TWICE A WEEK   Yes [provider]  Magnesium 500 MG CAPS Take 500 mg by mouth 4 (four) times daily.   Yes [provider]  NURTEC 75 MG TBDP TAKE 1 TABLET BY MOUTH EVERY OTHER DAY 09/10/23  Yes Penumalli, Vikram R, MD  omega-3 acid ethyl esters (LOVAZA) 1 g capsule Take by mouth 2 (two) times daily.   Yes [provider]  Potassium Gluconate 595 MG CAPS Take 595 mg by mouth daily.   Yes [provider]  rizatriptan  (MAXALT -MLT) 10 MG disintegrating tablet TAKE 1 TABLET BY MOUTH AS NEEDED FOR MIGRAINE (MAY REPEAT X 1 TAB AFTER 2 HOURS IF NEEDED MAX 2 TABS PER DAY OR 8 TABS PER MONTH). 09/19/23  Yes Penumalli, Vikram R, MD  rosuvastatin  (CRESTOR ) 10 MG tablet TAKE 1 TABLET BY MOUTH EVERY DAY 10/18/23  Yes Harvest Stanco, Emil Schanz, MD  tirzepatide  5 MG/0.5ML injection vial Inject 5 mg into the skin once a week. 09/26/23  Yes Purcell Emil Schanz,  MD  valACYclovir  (VALTREX ) 500 MG tablet TAKE 1 TABLET BY MOUTH TWICE A DAY 07/30/23  Yes Fleeta Rothman, Jomarie SAILOR, MD  valsartan -hydrochlorothiazide  (DIOVAN -HCT) 160-12.5 MG tablet Take 1 tablet by mouth daily. 07/24/23  Yes Nakira Litzau Jose, MD  vitamin A 8000 UNIT capsule Take 8,000 Units by mouth daily.   Yes [provider]  vitamin C (ASCORBIC ACID) 500 MG tablet Take 500 mg by mouth daily.   Yes [provider]  tirzepatide  (MOUNJARO ) 5 MG/0.5ML Pen Inject 5 mg into the skin once a week. 09/17/23   Purcell Emil Schanz, MD    Allergies  Allergen Reactions   Adhesive [Tape] Rash   Other Rash    Patient Active Problem List   Diagnosis Date Noted   Chronic pain syndrome 01/23/2023   History of fibromyalgia 01/23/2023   Chronic pain of both hips 07/04/2022   Insomnia 01/02/2022   Dyslipidemia 12/30/2020   History of depression 09/02/2020   Smoker 09/02/2020   Essential hypertension 09/02/2020   Depression 11/03/2014   Anxiety 01/18/2012   Chronic anemia 10/14/2011   Chronic headache disorder 04/18/2011   Obese 04/18/2011   Chronic rhinitis 04/18/2011    Class: Chronic    Past Medical History:  Diagnosis Date   Allergy    Anemia    Anxiety    Arthritis    Benign  recurrent meningitis (mollaret) 11/18/2018   Chronic headache disorder    s/p Meningitis x 2, tension- type plus allergic/sinus problems   COVID 08/16/2020   COVID-19 05/2020   History of abnormal Pap smear    Meningitis, viral At age 29 and at age 21   Nasal fracture 2011   Other and unspecified ovarian cysts 2011- pelvic CT   GYN- Dr. Darina   Vaginal spotting 06/04/2019   Vision abnormalities     Past Surgical History:  Procedure Laterality Date   BACK SURGERY  02/09/15   L5 - S1 diskectomy   FOOT SURGERY  s/p 2 procedures   LAPAROSCOPY FOR ECTOPIC PREGNANCY  1998   Fallopian tube removed   MOUTH SURGERY     spinal injection  03/10/15   SPINE SURGERY     UTERINE ARTERY EMBOLIZATION       Social History   Socioeconomic History   Marital status: Married    Spouse name: Rolan   Number of children: Not on file   Years of education: Not on file   Highest education level: Bachelor's degree (e.g., BA, AB, BS)  Occupational History   Occupation: Chief Financial Officer: [employer]  Tobacco Use   Smoking status: Some Days    Types: Cigarettes   Smokeless tobacco: Never  Vaping Use   Vaping status: Never Used  Substance and Sexual Activity   Alcohol use: Yes    Alcohol/week: 1.0 standard drink of alcohol    Types: 1 Standard drinks or equivalent per week   Drug use: Yes    Types: Marijuana    Comment: per pt few times a year   Sexual activity: Yes    Partners: Male    Birth control/protection: Implant    Comment: sexually active w/ female husband  Other Topics Concern   Not on file  Social History Narrative   Drinks about 12-16oz of coffee a day, occasionally drinks a tea.   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    Social Drivers of Health   Financial Resource Strain: Low Risk  (10/22/2023)   Overall Financial Resource Strain (CARDIA)    Difficulty of Paying Living Expenses: Not hard at all  Food Insecurity: No Food Insecurity (10/22/2023)   Hunger Vital Sign    Worried About Running Out of Food in the Last Year: Never true    Ran Out of Food in the Last Year: Never true  Transportation Needs: No Transportation Needs (10/22/2023)   PRAPARE - Administrator, Civil Service (Medical): No    Lack of Transportation (Non-Medical): No  Physical Activity: Sufficiently Active (10/22/2023)   Exercise Vital Sign    Days of Exercise per Week: 6 days    Minutes of Exercise per Session: 60 min  Stress: No Stress Concern Present (10/22/2023)   [employer] of Occupational Health - Occupational Stress Questionnaire    Feeling of Stress: Not at all  Recent Concern: Stress - Stress Concern Present (07/24/2023)   [employer] of Occupational Health -  Occupational Stress Questionnaire    Feeling of Stress : To some extent  Social Connections: Moderately Isolated (10/22/2023)   Social Connection and Isolation Panel    Frequency of Communication with Friends and Family: Once a week    Frequency of Social Gatherings with Friends and Family: Once a week    Attends Religious Services: Never    Database administrator or Organizations: Yes    Attends Club or  Organization Meetings: More than 4 times per year    Marital Status: Married  Catering manager Violence: Not on file    Family History  Problem Relation Age of Onset   Migraines Father        cluster headache   Hyperlipidemia Father    Hypertension Father    Cancer Mother 52       Breast   Stroke Maternal Grandmother    Diabetes Paternal Grandmother    Diabetes Other        paternal relaitve   Seizures Other        maternal relative   Rheum arthritis Other        maternal relatives   Colon polyps Neg Hx    Esophageal cancer Neg Hx    Stomach cancer Neg Hx    Rectal cancer Neg Hx      Review of Systems  Constitutional: Negative.  Negative for chills and fever.  HENT: Negative.  Negative for congestion and sore throat.   Respiratory: Negative.  Negative for cough and shortness of breath.   Cardiovascular: Negative.  Negative for chest pain and palpitations.  Gastrointestinal:  Negative for abdominal pain, diarrhea, nausea and vomiting.  Genitourinary: Negative.  Negative for dysuria and hematuria.  Skin: Negative.  Negative for rash.  Neurological: Negative.  Negative for dizziness and headaches.  All other systems reviewed and are negative.   Today's Vitals   10/23/23 1306  BP: 114/80  Pulse: 94  Temp: 97.9 F (36.6 C)  TempSrc: Oral  SpO2: 98%  Weight: 185 lb (83.9 kg)  Height: 5' 3 (1.6 m)   Body mass index is 32.77 kg/m.   Physical Exam Vitals reviewed.  Constitutional:      Appearance: Normal appearance.  HENT:     Head: Normocephalic.     Right  Ear: Tympanic membrane, ear canal and external ear normal.     Left Ear: Tympanic membrane, ear canal and external ear normal.     Mouth/Throat:     Mouth: Mucous membranes are moist.     Pharynx: Oropharynx is clear.  Eyes:     Extraocular Movements: Extraocular movements intact.     Conjunctiva/sclera: Conjunctivae normal.     Pupils: Pupils are equal, round, and reactive to light.  Cardiovascular:     Rate and Rhythm: Normal rate and regular rhythm.     Pulses: Normal pulses.     Heart sounds: Normal heart sounds.  Pulmonary:     Effort: Pulmonary effort is normal.     Breath sounds: Normal breath sounds.  Musculoskeletal:     Cervical back: No tenderness.  Lymphadenopathy:     Cervical: No cervical adenopathy.  Skin:    General: Skin is warm and dry.  Neurological:     Mental Status: She is alert and oriented to person, place, and time.  Psychiatric:        Mood and Affect: Mood normal.        Behavior: Behavior normal.      ASSESSMENT & PLAN: A total of 45 minutes was spent with the patient and counseling/coordination of care regarding preparing for this visit, review of most recent office visit notes, review of multiple chronic medical conditions and their management, review of all medications, review of most recent bloodwork results, review of health maintenance items, education on nutrition, prognosis, documentation, and need for follow up.   Problem List Items Addressed This Visit       Cardiovascular and Mediastinum  Essential hypertension - Primary   BP Readings from Last 3 Encounters:  10/23/23 114/80  07/24/23 122/84  01/23/23 (!) 138/92  Well-controlled hypertension Continue valsartan  HCT 160-12.5 mg daily Cardiovascular risks associated with hypertension discussed Diet and nutrition discussed         Other   Obese   Eating better and losing weight Tolerating Zepbound  very well No significant side effects Recommend to continue Zepbound  at 7.5 mg  weekly Diet and nutrition discussed      Relevant Medications   tirzepatide  7.5 MG/0.5ML injection vial   Smoker   Dyslipidemia   Chronic stable condition Continue rosuvastatin  10 mg daily Lipid file done today Diet and nutrition discussed      Chronic pain syndrome   Stable and well-controlled Pain management discussed Has been able to see orthopedic, sports medicine, and rheumatologist in the last couple of months.      Patient Instructions  Health Maintenance, Female Adopting a healthy lifestyle and getting preventive care are important in promoting health and wellness. Ask your health care provider about: The right schedule for you to have regular tests and exams. Things you can do on your own to prevent diseases and keep yourself healthy. What should I know about diet, weight, and exercise? Eat a healthy diet  Eat a diet that includes plenty of vegetables, fruits, low-fat dairy products, and lean protein. Do not eat a lot of foods that are high in solid fats, added sugars, or sodium. Maintain a healthy weight Body mass index (BMI) is used to identify weight problems. It estimates body fat based on height and weight. Your health care provider can help determine your BMI and help you achieve or maintain a healthy weight. Get regular exercise Get regular exercise. This is one of the most important things you can do for your health. Most adults should: Exercise for at least 150 minutes each week. The exercise should increase your heart rate and make you sweat (moderate-intensity exercise). Do strengthening exercises at least twice a week. This is in addition to the moderate-intensity exercise. Spend less time sitting. Even light physical activity can be beneficial. Watch cholesterol and blood lipids Have your blood tested for lipids and cholesterol at 50 years of age, then have this test every 5 years. Have your cholesterol levels checked more often if: Your lipid or  cholesterol levels are high. You are older than 50 years of age. You are at high risk for heart disease. What should I know about cancer screening? Depending on your health history and family history, you may need to have cancer screening at various ages. This may include screening for: Breast cancer. Cervical cancer. Colorectal cancer. Skin cancer. Lung cancer. What should I know about heart disease, diabetes, and high blood pressure? Blood pressure and heart disease High blood pressure causes heart disease and increases the risk of stroke. This is more likely to develop in people who have high blood pressure readings or are overweight. Have your blood pressure checked: Every 3-5 years if you are 28-11 years of age. Every year if you are 13 years old or older. Diabetes Have regular diabetes screenings. This checks your fasting blood sugar level. Have the screening done: Once every three years after age 19 if you are at a normal weight and have a low risk for diabetes. More often and at a younger age if you are overweight or have a high risk for diabetes. What should I know about preventing infection? Hepatitis B If you  have a higher risk for hepatitis B, you should be screened for this virus. Talk with your health care provider to find out if you are at risk for hepatitis B infection. Hepatitis C Testing is recommended for: Everyone born from [birth year] through 1967. Anyone with known risk factors for hepatitis C. Sexually transmitted infections (STIs) Get screened for STIs, including gonorrhea and chlamydia, if: You are sexually active and are younger than 50 years of age. You are older than 50 years of age and your health care provider tells you that you are at risk for this type of infection. Your sexual activity has changed since you were last screened, and you are at increased risk for chlamydia or gonorrhea. Ask your health care provider if you are at risk. Ask your health care  provider about whether you are at high risk for HIV. Your health care provider may recommend a prescription medicine to help prevent HIV infection. If you choose to take medicine to prevent HIV, you should first get tested for HIV. You should then be tested every 3 months for as long as you are taking the medicine. Pregnancy If you are about to stop having your period (premenopausal) and you may become pregnant, seek counseling before you get pregnant. Take 400 to 800 micrograms (mcg) of folic acid every day if you become pregnant. Ask for birth control (contraception) if you want to prevent pregnancy. Osteoporosis and menopause Osteoporosis is a disease in which the bones lose minerals and strength with aging. This can result in bone fractures. If you are 53 years old or older, or if you are at risk for osteoporosis and fractures, ask your health care provider if you should: Be screened for bone loss. Take a calcium  or vitamin D  supplement to lower your risk of fractures. Be given hormone replacement therapy (HRT) to treat symptoms of menopause. Follow these instructions at home: Alcohol use Do not drink alcohol if: Your health care provider tells you not to drink. You are pregnant, may be pregnant, or are planning to become pregnant. If you drink alcohol: Limit how much you have to: 0-1 drink a day. Know how much alcohol is in your drink. In the [place] one drink equals one 12 oz bottle of beer (355 mL), one 5 oz glass of wine (148 mL), or one 1 oz glass of hard liquor (44 mL). Lifestyle Do not use any products that contain nicotine or tobacco. These products include cigarettes, chewing tobacco, and vaping devices, such as e-cigarettes. If you need help quitting, ask your health care provider. Do not use street drugs. Do not share needles. Ask your health care provider for help if you need support or information about quitting drugs. General instructions Schedule regular health, dental, and  eye exams. Stay current with your vaccines. Tell your health care provider if: You often feel depressed. You have ever been abused or do not feel safe at home. Summary Adopting a healthy lifestyle and getting preventive care are important in promoting health and wellness. Follow your health care provider's instructions about healthy diet, exercising, and getting tested or screened for diseases. Follow your health care provider's instructions on monitoring your cholesterol and blood pressure. This information is not intended to replace advice given to you by your health care provider. Make sure you discuss any questions you have with your health care provider. Document Revised: 08/09/2020 Document Reviewed: 08/09/2020 Elsevier Patient Education  2024 Elsevier Inc.     Emil Schaumann, MD [facility] Primary Care  at [facility]

## 2023-10-25 ENCOUNTER — Ambulatory Visit: Payer: 59 | Admitting: Infectious Disease

## 2023-11-23 ENCOUNTER — Telehealth: Admitting: Physician Assistant

## 2023-11-23 DIAGNOSIS — R051 Acute cough: Secondary | ICD-10-CM | POA: Diagnosis not present

## 2023-11-23 MED ORDER — BENZONATATE 100 MG PO CAPS: 100.0000 mg | ORAL_CAPSULE | Freq: Three times a day (TID) | ORAL | 0 refills | Status: AC

## 2023-11-23 MED ORDER — AZITHROMYCIN 250 MG PO TABS: ORAL_TABLET | ORAL | 0 refills | Status: AC

## 2023-11-23 NOTE — Patient Instructions (Signed)
 Angela Farrell, thank you for joining Lynden GORMAN Snuffer, PA-C for today's virtual visit.  While this provider is not your primary care provider (PCP), if your PCP is located in our provider database this encounter information will be shared with them immediately following your visit.   A [facility] MyChart account gives you access to today's visit and all your visits, tests, and labs performed at [facility]  click here if you don't have a [facility] MyChart account or go to [email or web address]  Consent: (Patient) Angela Farrell provided verbal consent for this virtual visit at the beginning of the encounter.  Current Medications:  Current Outpatient Medications:    Acetaminophen  (TYLENOL  8 HOUR PO), Take by mouth., Disp: , Rfl:    diclofenac  (VOLTAREN ) 75 MG EC tablet, Take 1 tablet (75 mg total) by mouth 2 (two) times daily., Disp: 50 tablet, Rfl: 2   estradiol (VIVELLE-DOT) 0.075 MG/24HR, APPLY 1 PATCH TRANSDERMALLY TWICE A WEEK, Disp: , Rfl:    Magnesium 500 MG CAPS, Take 500 mg by mouth 4 (four) times daily., Disp: , Rfl:    NURTEC 75 MG TBDP, TAKE 1 TABLET BY MOUTH EVERY OTHER DAY, Disp: 16 tablet, Rfl: 2   omega-3 acid ethyl esters (LOVAZA) 1 g capsule, Take by mouth 2 (two) times daily., Disp: , Rfl:    Potassium Gluconate 595 MG CAPS, Take 595 mg by mouth daily., Disp: , Rfl:    rizatriptan  (MAXALT -MLT) 10 MG disintegrating tablet, TAKE 1 TABLET BY MOUTH AS NEEDED FOR MIGRAINE (MAY REPEAT X 1 TAB AFTER 2 HOURS IF NEEDED MAX 2 TABS PER DAY OR 8 TABS PER MONTH)., Disp: 8 tablet, Rfl: 4   rosuvastatin  (CRESTOR ) 10 MG tablet, TAKE 1 TABLET BY MOUTH EVERY DAY, Disp: 90 tablet, Rfl: 3   tirzepatide  (ZEPBOUND ) 7.5 MG/0.5ML Pen, Inject 7.5 mg into the skin once a week., Disp: 2 mL, Rfl: 3   valACYclovir  (VALTREX ) 500 MG tablet, TAKE 1 TABLET BY MOUTH TWICE A DAY, Disp: 60 tablet, Rfl: 3   valsartan -hydrochlorothiazide  (DIOVAN -HCT) 160-12.5 MG tablet, Take 1  tablet by mouth daily., Disp: 90 tablet, Rfl: 3   vitamin A 8000 UNIT capsule, Take 8,000 Units by mouth daily., Disp: , Rfl:    vitamin C (ASCORBIC ACID) 500 MG tablet, Take 500 mg by mouth daily., Disp: , Rfl:    Medications ordered in this encounter:  No orders of the defined types were placed in this encounter.    *If you need refills on other medications prior to your next appointment, please contact your pharmacy*  Follow-Up: Call back or seek an in-person evaluation if the symptoms worsen or if the condition fails to improve as anticipated.  [facility] [phone]  Other Instructions You were given a prescription for antibiotics. Please take the antibiotic prescription fully.   Take tessalon  as directed   Follow up with your regular doctor in 1 week for reassessment and seek care sooner if your symptoms worsen or fail to improve.   If you have been instructed to have an in-person evaluation today at a local Urgent Care facility, please use the link below. It will take you to a list of all of our available [facility], including address, phone number and hours of operation. Please do not delay care.  [facility]  If you or a family member do not have a primary care provider, use the link below to schedule a visit and establish  care. When you choose a [facility] primary care physician or advanced practice provider, you gain a long-term partner in health. Find a Primary Care Provider  Learn more about [facility]'s in-office and virtual care options: [facility] - Get Care Now

## 2023-11-23 NOTE — Progress Notes (Signed)
 [name], Angela Farrell are scheduled for a virtual visit with your provider today.    Just as we do with appointments in the office, we must obtain your consent to participate.  Your consent will be active for this visit and any virtual visit you may have with one of our providers in the next 365 days.    If you have a MyChart account, I can also send a copy of this consent to you electronically.  All virtual visits are billed to your insurance company just like a traditional visit in the office.  As this is a virtual visit, video technology does not allow for your provider to perform a traditional examination.  This may limit your provider's ability to fully assess your condition.  If your provider identifies any concerns that need to be evaluated in person or the need to arrange testing such as labs, EKG, etc, we will make arrangements to do so.    Although advances in technology are sophisticated, we cannot ensure that it will always work on either your end or our end.  If the connection with a video visit is poor, we may have to switch to a telephone visit.  With either a video or telephone visit, we are not always able to ensure that we have a secure connection.   I need to obtain your verbal consent now.   Are you willing to proceed with your visit today?   Angela Farrell has provided verbal consent on 11/23/2023 for a virtual visit (video or telephone).   Angela Farrell, [place] 11/23/2023  6:30 PM   Date:  11/23/2023   ID:  Angela Farrell, Angela Farrell 1976/03/24, MRN [id number]  Patient Location: Home Provider Location: Home Office   Participants: Patient and Provider for Visit and Wrap up  Method of visit: Video  Location of Patient: Home Location of Provider: Home Office Consent was obtain for visit over the video. Services rendered by provider: Visit was performed via video  A video enabled telemedicine application was used and I verified that I am speaking with the correct person using  two identifiers.  PCP:  Angela Emil Schanz, MD   Chief Complaint:  duana sxs  History of Present Illness:    [name] is a 48 y.o. female with history as stated below. Presents video telehealth for an acute care visit  Pt states she just got back from [place]. Her husband developed URI sxs. She states his testing was negative and he was given a zpack. She is concerned because she was in an area with a leg[place] outbreak  She reports congestion, drainage, cough, fatigue, rhinorrhea, that started yesterday, Denies fevers or shortness of breath.  Past Medical, Surgical, Social History, Allergies, and Medications have been Reviewed.  Past Medical History:  Diagnosis Date   Allergy    Anemia    Anxiety    Arthritis    Benign recurrent meningitis (mollaret) 11/18/2018   Chronic headache disorder    s/p Meningitis x 2, tension- type plus allergic/sinus problems   COVID 08/16/2020   COVID-19 05/2020   History of abnormal Pap smear    Meningitis, viral At age 77 and at age 71   Nasal fracture 2011   Other and unspecified ovarian cysts 2011- pelvic CT   GYN- Dr. Darina   Vaginal spotting 06/04/2019   Vision abnormalities     No outpatient medications have been marked as taking for the 11/23/23 encounter (Appointment) with [facility] [place] PROVIDER.  Allergies:   Adhesive [tape] and Other   ROS See HPI for history of present illness.  Physical Exam Constitutional:      Appearance: Normal appearance.  Pulmonary:     Effort: Pulmonary effort is normal.     Comments: Speaking in full sentences Neurological:     Mental Status: She is alert.               MDM Pt with cough and uri sxs. Recently in an area with leg[place] outbreak. Will cover her with abx. She is well appearing and in no distress.    Tests Ordered: No orders of the defined types were placed in this encounter.   Medication Changes: No orders of the defined types were placed in this  encounter.    Disposition:  Follow up  Signed, Angela GORMAN Snuffer, PA-C  11/23/2023 6:30 PM

## 2023-11-28 ENCOUNTER — Ambulatory Visit: Admitting: Infectious Disease

## 2023-11-29 ENCOUNTER — Other Ambulatory Visit: Payer: Self-pay | Admitting: Infectious Disease

## 2023-11-29 DIAGNOSIS — B003 Herpesviral meningitis: Secondary | ICD-10-CM

## 2023-12-17 ENCOUNTER — Other Ambulatory Visit: Payer: Self-pay | Admitting: Diagnostic Neuroimaging

## 2023-12-23 NOTE — Progress Notes (Unsigned)
 Subjective:  Chief Complaint: follow-up for Mollaret's meningtiis   [name], female    DOB: [birth year], 48 y.o.   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 Darina   Vaginal spotting 06/04/2019   Vision abnormalities     Past Surgical History:  Procedure Laterality Date   BACK SURGERY  02/09/15   L5 - S1 diskectomy   FOOT SURGERY  s/p 2 procedures   LAPAROSCOPY FOR ECTOPIC PREGNANCY  1998   Fallopian tube removed   MOUTH SURGERY     spinal injection  03/10/15   SPINE SURGERY     UTERINE ARTERY EMBOLIZATION      Family History  Problem Relation Age of Onset   Migraines Father        cluster headache   Hyperlipidemia Father    Hypertension Father    Cancer Mother 73       Breast   Stroke Maternal Grandmother    Diabetes Paternal Grandmother    Diabetes Other        paternal relaitve   Seizures Other        maternal relative   Rheum arthritis Other        maternal relatives   Colon polyps Neg Hx    Esophageal cancer Neg Hx    Stomach cancer Neg Hx    Rectal cancer Neg Hx       Social History   Socioeconomic History   Marital status: Married    Spouse name: Rolan   Number of children: Not on file   Years of education: Not on file   Highest education level: Bachelor's degree (e.g., BA, AB, BS)  Occupational History   Occupation: Chief Financial Officer: [employer]  Tobacco Use   Smoking status: Some Days    Types: Cigarettes   Smokeless tobacco: Never  Vaping Use   Vaping status: Never Used  Substance and Sexual  Activity   Alcohol use: Yes    Alcohol/week: 1.0 standard drink of alcohol    Types: 1 Standard drinks or equivalent per week   Drug use: Yes    Types: Marijuana    Comment: per pt few times a year   Sexual activity: Yes    Partners: Male    Birth control/protection: Implant    Comment: sexually active w/ female husband  Other Topics Concern   Not on file  Social History Narrative   Drinks about 12-16oz of coffee a day, occasionally drinks a tea.   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    Social Drivers of Health   Financial Resource Strain: Low Risk  (10/22/2023)   Overall Financial Resource Strain (CARDIA)    Difficulty of Paying Living Expenses: Not hard at all  Food Insecurity: No Food Insecurity (10/22/2023)   Hunger Vital Sign    Worried About Running Out of Food in the Last Year: Never true    Ran Out of Food in the  Last Year: Never true  Transportation Needs: No Transportation Needs (10/22/2023)   PRAPARE - Administrator, Civil Service (Medical): No    Lack of Transportation (Non-Medical): No  Physical Activity: Sufficiently Active (10/22/2023)   Exercise Vital Sign    Days of Exercise per Week: 6 days    Minutes of Exercise per Session: 60 min  Stress: No Stress Concern Present (10/22/2023)   [employer] of Occupational Health - Occupational Stress Questionnaire    Feeling of Stress: Not at all  Recent Concern: Stress - Stress Concern Present (07/24/2023)   [employer] of Occupational Health - Occupational Stress Questionnaire    Feeling of Stress : To some extent  Social Connections: Moderately Isolated (10/22/2023)   Social Connection and Isolation Panel    Frequency of Communication with Friends and Family: Once a week    Frequency of Social Gatherings with Friends and Family: Once a week    Attends Religious Services: Never    Database administrator or Organizations: Yes    Attends Engineer, structural: More than 4 times per year    Marital  Status: Married    Allergies  Allergen Reactions   Adhesive [Tape] Rash   Other Rash     Current Outpatient Medications:    Acetaminophen  (TYLENOL  8 HOUR PO), Take by mouth., Disp: , Rfl:    diclofenac  (VOLTAREN ) 75 MG EC tablet, Take 1 tablet (75 mg total) by mouth 2 (two) times daily., Disp: 50 tablet, Rfl: 2   estradiol (VIVELLE-DOT) 0.075 MG/24HR, APPLY 1 PATCH TRANSDERMALLY TWICE A WEEK, Disp: , Rfl:    Magnesium 500 MG CAPS, Take 500 mg by mouth 4 (four) times daily., Disp: , Rfl:    omega-3 acid ethyl esters (LOVAZA) 1 g capsule, Take by mouth 2 (two) times daily., Disp: , Rfl:    Potassium Gluconate 595 MG CAPS, Take 595 mg by mouth daily., Disp: , Rfl:    Rimegepant Sulfate (NURTEC) 75 MG TBDP, Take 1 tablet (75 mg total) by mouth every other day. Call [phone] to schedule follow up for ongoing refills, Disp: 16 tablet, Rfl: 0   rizatriptan  (MAXALT -MLT) 10 MG disintegrating tablet, TAKE 1 TABLET BY MOUTH AS NEEDED FOR MIGRAINE (MAY REPEAT X 1 TAB AFTER 2 HOURS IF NEEDED MAX 2 TABS PER DAY OR 8 TABS PER MONTH)., Disp: 8 tablet, Rfl: 4   rosuvastatin  (CRESTOR ) 10 MG tablet, TAKE 1 TABLET BY MOUTH EVERY DAY, Disp: 90 tablet, Rfl: 3   tirzepatide  (ZEPBOUND ) 7.5 MG/0.5ML Pen, Inject 7.5 mg into the skin once a week., Disp: 2 mL, Rfl: 3   valACYclovir  (VALTREX ) 500 MG tablet, TAKE 1 TABLET BY MOUTH TWICE A DAY, Disp: 60 tablet, Rfl: 3   valsartan -hydrochlorothiazide  (DIOVAN -HCT) 160-12.5 MG tablet, Take 1 tablet by mouth daily., Disp: 90 tablet, Rfl: 3   vitamin A 8000 UNIT capsule, Take 8,000 Units by mouth daily., Disp: , Rfl:    vitamin C (ASCORBIC ACID) 500 MG tablet, Take 500 mg by mouth daily., Disp: , Rfl:    Review of Systems     Objective:   Physical Exam        Assessment & Plan:

## 2023-12-24 ENCOUNTER — Other Ambulatory Visit: Payer: Self-pay

## 2023-12-24 ENCOUNTER — Ambulatory Visit (INDEPENDENT_AMBULATORY_CARE_PROVIDER_SITE_OTHER): Admitting: Infectious Disease

## 2023-12-24 VITALS — BP 104/72 | HR 88 | Temp 97.7°F | Resp 16 | Wt 172.2 lb

## 2023-12-24 DIAGNOSIS — B003 Herpesviral meningitis: Secondary | ICD-10-CM | POA: Diagnosis not present

## 2023-12-24 DIAGNOSIS — G032 Benign recurrent meningitis [Mollaret]: Secondary | ICD-10-CM | POA: Diagnosis not present

## 2023-12-24 DIAGNOSIS — G43809 Other migraine, not intractable, without status migrainosus: Secondary | ICD-10-CM

## 2023-12-24 MED ORDER — VALACYCLOVIR HCL 500 MG PO TABS: 500.0000 mg | ORAL_TABLET | Freq: Two times a day (BID) | ORAL | 11 refills | Status: AC

## 2024-01-11 ENCOUNTER — Other Ambulatory Visit: Payer: Self-pay | Admitting: Medical Genetics

## 2024-01-24 ENCOUNTER — Encounter: Admitting: Emergency Medicine

## 2024-01-24 ENCOUNTER — Other Ambulatory Visit: Payer: Self-pay | Admitting: Diagnostic Neuroimaging

## 2024-01-28 ENCOUNTER — Ambulatory Visit: Admitting: Emergency Medicine

## 2024-01-28 ENCOUNTER — Encounter: Payer: Self-pay | Admitting: Emergency Medicine

## 2024-01-28 VITALS — BP 122/74 | HR 80 | Temp 98.1°F | Ht 63.0 in | Wt 168.0 lb

## 2024-01-28 DIAGNOSIS — Z13228 Encounter for screening for other metabolic disorders: Secondary | ICD-10-CM

## 2024-01-28 DIAGNOSIS — Z6830 Body mass index (BMI) 30.0-30.9, adult: Secondary | ICD-10-CM

## 2024-01-28 DIAGNOSIS — Z130 Encounter for screening for diseases of the blood and blood-forming organs and certain disorders involving the immune mechanism: Secondary | ICD-10-CM

## 2024-01-28 DIAGNOSIS — E66811 Obesity, class 1: Secondary | ICD-10-CM

## 2024-01-28 DIAGNOSIS — Z13220 Encounter for screening for lipoid disorders: Secondary | ICD-10-CM

## 2024-01-28 DIAGNOSIS — F17200 Nicotine dependence, unspecified, uncomplicated: Secondary | ICD-10-CM | POA: Diagnosis not present

## 2024-01-28 DIAGNOSIS — Z1329 Encounter for screening for other suspected endocrine disorder: Secondary | ICD-10-CM | POA: Diagnosis not present

## 2024-01-28 DIAGNOSIS — Z0001 Encounter for general adult medical examination with abnormal findings: Secondary | ICD-10-CM | POA: Diagnosis not present

## 2024-01-28 DIAGNOSIS — E6609 Other obesity due to excess calories: Secondary | ICD-10-CM | POA: Diagnosis not present

## 2024-01-28 DIAGNOSIS — M1990 Unspecified osteoarthritis, unspecified site: Secondary | ICD-10-CM | POA: Insufficient documentation

## 2024-01-28 DIAGNOSIS — G894 Chronic pain syndrome: Secondary | ICD-10-CM | POA: Diagnosis not present

## 2024-01-28 LAB — COMPREHENSIVE METABOLIC PANEL WITH GFR
ALT: 27 U/L (ref 0–35)
AST: 23 U/L (ref 0–37)
Albumin: 4.5 g/dL (ref 3.5–5.2)
Alkaline Phosphatase: 53 U/L (ref 39–117)
BUN: 7 mg/dL (ref 6–23)
CO2: 27 meq/L (ref 19–32)
Calcium: 9.1 mg/dL (ref 8.4–10.5)
Chloride: 100 meq/L (ref 96–112)
Creatinine, Ser: 0.63 mg/dL (ref 0.40–1.20)
GFR: 103.6 mL/min (ref >60.00)
Glucose, Bld: 89 mg/dL (ref 70–99)
Potassium: 3.9 meq/L (ref 3.5–5.1)
Sodium: 136 meq/L (ref 135–145)
Total Bilirubin: 1.2 mg/dL (ref 0.2–1.2)
Total Protein: 7.2 g/dL (ref 6.0–8.3)

## 2024-01-28 LAB — CBC WITH DIFFERENTIAL/PLATELET
Basophils Absolute: 0 K/uL (ref 0.0–0.1)
Basophils Relative: 0.5 % (ref 0.0–3.0)
Eosinophils Absolute: 0.1 K/uL (ref 0.0–0.7)
Eosinophils Relative: 1.2 % (ref 0.0–5.0)
HCT: 38.6 % (ref 36.0–46.0)
Hemoglobin: 12.3 g/dL (ref 12.0–15.0)
Lymphocytes Relative: 46.4 % — ABNORMAL HIGH (ref 12.0–46.0)
Lymphs Abs: 2.3 K/uL (ref 0.7–4.0)
MCHC: 32 g/dL (ref 30.0–36.0)
MCV: 93.1 fl (ref 78.0–100.0)
Monocytes Absolute: 0.4 K/uL (ref 0.1–1.0)
Monocytes Relative: 7.6 % (ref 3.0–12.0)
Neutro Abs: 2.2 K/uL (ref 1.4–7.7)
Neutrophils Relative %: 44.3 % (ref 43.0–77.0)
Platelets: 317 K/uL (ref 150.0–400.0)
RBC: 4.15 Mil/uL (ref 3.87–5.11)
RDW: 13.6 % (ref 11.5–15.5)
WBC: 5 K/uL (ref 4.0–10.5)

## 2024-01-28 LAB — LIPID PANEL
Cholesterol: 141 mg/dL (ref 0–200)
HDL: 58.3 mg/dL (ref >39.00)
LDL Cholesterol: 64 mg/dL (ref 0–99)
NonHDL: 82.3
Total CHOL/HDL Ratio: 2
Triglycerides: 93 mg/dL (ref 0.0–149.0)
VLDL: 18.6 mg/dL (ref 0.0–40.0)

## 2024-01-28 NOTE — Patient Instructions (Signed)

## 2024-01-28 NOTE — Assessment & Plan Note (Signed)
 Eating better and losing weight Tolerating Zepbound  very well No significant side effects Recommend to continue Zepbound  at 7.5 mg weekly Diet and nutrition discussed

## 2024-01-28 NOTE — Assessment & Plan Note (Signed)
 Stable and well-controlled Pain management discussed Has been able to see orthopedic, sports medicine, and rheumatologist in the last couple of months.

## 2024-01-28 NOTE — Progress Notes (Signed)
 Angela Farrell 48 y.o.   Chief Complaint  Patient presents with   Annual Exam    HISTORY OF PRESENT ILLNESS: This is a 48 y.o. female here for annual exam and follow-up of chronic medical problems Overall doing well.  Has questions about Zepbound  and weight loss No other complaints or medical concerns today.  HPI   Prior to Admission medications   Medication Sig Start Date End Date Taking? Authorizing Provider  Acetaminophen  (TYLENOL  8 HOUR PO) Take by mouth.   Yes [provider]  diclofenac  (VOLTAREN ) 75 MG EC tablet Take 1 tablet (75 mg total) by mouth 2 (two) times daily. 05/28/23  Yes Regal, Pasco RAMAN, DPM  estradiol (VIVELLE-DOT) 0.075 MG/24HR APPLY 1 PATCH TRANSDERMALLY TWICE A WEEK   Yes [provider]  Magnesium 500 MG CAPS Take 500 mg by mouth 4 (four) times daily.   Yes [provider]  omega-3 acid ethyl esters (LOVAZA) 1 g capsule Take by mouth 2 (two) times daily.   Yes [provider]  Potassium Gluconate 595 MG CAPS Take 595 mg by mouth daily.   Yes [provider]  Rimegepant Sulfate (NURTEC) 75 MG TBDP Take 1 tablet (75 mg total) by mouth every other day. Call [phone] to schedule follow up for ongoing refills 12/19/23  Yes Penumalli, Eduard SAUNDERS, MD  rizatriptan  (MAXALT -MLT) 10 MG disintegrating tablet TAKE 1 TABLET BY MOUTH AS NEEDED FOR MIGRAINE (MAY REPEAT X 1 TAB AFTER 2 HOURS IF NEEDED MAX 2 TABS PER DAY OR 8 TABS PER MONTH). 09/19/23  Yes Penumalli, Vikram R, MD  rosuvastatin  (CRESTOR ) 10 MG tablet TAKE 1 TABLET BY MOUTH EVERY DAY 10/18/23  Yes Waymond Meador, Emil Schanz, MD  tirzepatide  (ZEPBOUND ) 7.5 MG/0.5ML Pen Inject 7.5 mg into the skin once a week. 10/23/23  Yes Bayleigh Loflin, Emil Schanz, MD  valACYclovir  (VALTREX ) 500 MG tablet Take 1 tablet (500 mg total) by mouth 2 (two) times daily. When having an HSV meningits flare increase to 1000mg  BID 12/24/23  Yes Fleeta Rothman, Jomarie SAILOR, MD  valsartan -hydrochlorothiazide   (DIOVAN -HCT) 160-12.5 MG tablet Take 1 tablet by mouth daily. 07/24/23  Yes Joann Jorge Jose, MD  vitamin A 8000 UNIT capsule Take 8,000 Units by mouth daily.   Yes [provider]  vitamin C (ASCORBIC ACID) 500 MG tablet Take 500 mg by mouth daily.   Yes [provider]    Allergies  Allergen Reactions   Adhesive [Tape] Rash   Other Rash    Patient Active Problem List   Diagnosis Date Noted   Osteoarthritis 01/28/2024   Chronic pain syndrome 01/23/2023   Numbness 01/07/2023   Back muscle spasm 12/28/2022   Chronic pain of both hips 07/04/2022   Smoker 09/02/2020   Anxiety 01/18/2012   Chronic headache disorder 04/18/2011   Obese 04/18/2011   Chronic rhinitis 04/18/2011    Class: Chronic    Past Medical History:  Diagnosis Date   Allergy    Anemia    Anxiety    Arthritis    Benign recurrent meningitis (mollaret) 11/18/2018   Chronic headache disorder    s/p Meningitis x 2, tension- type plus allergic/sinus problems   COVID 08/16/2020   COVID-19 05/2020   History of abnormal Pap smear    Meningitis, viral At age 60 and at age 87   Nasal fracture 2011   Other and unspecified ovarian cysts 2011- pelvic CT   GYN- Dr. Darina   Vaginal spotting 06/04/2019   Vision abnormalities  Past Surgical History:  Procedure Laterality Date   BACK SURGERY  02/09/15   L5 - S1 diskectomy   FOOT SURGERY  s/p 2 procedures   LAPAROSCOPY FOR ECTOPIC PREGNANCY  1998   Fallopian tube removed   MOUTH SURGERY     spinal injection  03/10/15   SPINE SURGERY     UTERINE ARTERY EMBOLIZATION      Social History   Socioeconomic History   Marital status: Married    Spouse name: Rolan   Number of children: Not on file   Years of education: Not on file   Highest education level: Bachelor's degree (e.g., BA, AB, BS)  Occupational History   Occupation: Chief Financial Officer: [employer]  Tobacco Use   Smoking status: Some Days    Types: Cigarettes   Smokeless  tobacco: Never  Vaping Use   Vaping status: Never Used  Substance and Sexual Activity   Alcohol use: Yes    Alcohol/week: 1.0 standard drink of alcohol    Types: 1 Standard drinks or equivalent per week   Drug use: Yes    Types: Marijuana    Comment: per pt few times a year   Sexual activity: Yes    Partners: Male    Birth control/protection: Implant    Comment: sexually active w/ female husband  Other Topics Concern   Not on file  Social History Narrative   Drinks about 12-16oz of coffee a day, occasionally drinks a tea.   [employer]oks on animator as systems developer.    Social Drivers of Health   Financial Resource Strain: Low Risk  (01/25/2024)   Overall Financial Resource Strain (CARDIA)    Difficulty of Paying Living Expenses: Not hard at all  Food Insecurity: No Food Insecurity (01/25/2024)   Hunger Vital Sign    Worried About Running Out of Food in the Last Year: Never true    Ran Out of Food in the Last Year: Never true  Transportation Needs: No Transportation Needs (01/25/2024)   PRAPARE - Administrator, Civil Service (Medical): No    Lack of Transportation (Non-Medical): No  Physical Activity: Sufficiently Active (01/25/2024)   Exercise Vital Sign    Days of Exercise per Week: 5 days    Minutes of Exercise per Session: 40 min  Stress: No Stress Concern Present (01/25/2024)   [employer] of Occupational Health - Occupational Stress Questionnaire    Feeling of Stress: Not at all  Social Connections: Moderately Isolated (01/25/2024)   Social Connection and Isolation Panel    Frequency of Communication with Friends and Family: Once a week    Frequency of Social Gatherings with Friends and Family: Once a week    Attends Religious Services: Never    Database Administrator or Organizations: Yes    Attends Engineer, Structural: 1 to 4 times per year    Marital Status: Married  Catering Manager Violence: Not on file    Family History  Problem Relation Age  of Onset   Migraines Father        cluster headache   Hyperlipidemia Father    Hypertension Father    Cancer Mother 22       Breast   Stroke Maternal Grandmother    Diabetes Paternal Grandmother    Diabetes Other        paternal relaitve   Seizures Other        maternal relative   Rheum arthritis Other  maternal relatives   Colon polyps Neg Hx    Esophageal cancer Neg Hx    Stomach cancer Neg Hx    Rectal cancer Neg Hx      Review of Systems  Constitutional: Negative.  Negative for chills and fever.  HENT: Negative.  Negative for congestion and sore throat.   Respiratory: Negative.  Negative for cough and shortness of breath.   Cardiovascular: Negative.  Negative for chest pain and palpitations.  Gastrointestinal:  Negative for abdominal pain, diarrhea, nausea and vomiting.  Genitourinary: Negative.  Negative for dysuria and hematuria.  Skin: Negative.  Negative for rash.  Neurological: Negative.  Negative for dizziness and headaches.  All other systems reviewed and are negative.   Vitals:   01/28/24 1449  BP: 122/74  Pulse: 80  Temp: 98.1 F (36.7 C)  SpO2: 100%    Physical Exam Vitals reviewed.  Constitutional:      Appearance: Normal appearance.  HENT:     Head: Normocephalic.     Right Ear: Tympanic membrane, ear canal and external ear normal.     Left Ear: Tympanic membrane, ear canal and external ear normal.     Mouth/Throat:     Mouth: Mucous membranes are moist.     Pharynx: Oropharynx is clear.  Eyes:     Extraocular Movements: Extraocular movements intact.     Pupils: Pupils are equal, round, and reactive to light.  Cardiovascular:     Rate and Rhythm: Normal rate and regular rhythm.     Pulses: Normal pulses.     Heart sounds: Normal heart sounds.  Pulmonary:     Effort: Pulmonary effort is normal.     Breath sounds: Normal breath sounds.  Abdominal:     Palpations: Abdomen is soft.     Tenderness: There is no abdominal tenderness.   Musculoskeletal:     Cervical back: No tenderness.  Lymphadenopathy:     Cervical: No cervical adenopathy.  Skin:    General: Skin is warm and dry.     Capillary Refill: Capillary refill takes less than 2 seconds.  Neurological:     General: No focal deficit present.     Mental Status: She is alert and oriented to person, place, and time.  Psychiatric:        Mood and Affect: Mood normal.        Behavior: Behavior normal.      ASSESSMENT & PLAN: Problem List Items Addressed This Visit       Other   Obese   Eating better and losing weight Tolerating Zepbound  very well No significant side effects Recommend to continue Zepbound  at 7.5 mg weekly Diet and nutrition discussed      Smoker   Cardiovascular and cancer risks associated with smoking discussed. Smoking cessation advice given      Chronic pain syndrome   Stable and well-controlled Pain management discussed Has been able to see orthopedic, sports medicine, and rheumatologist in the last couple of months      Other Visit Diagnoses       Encounter for general adult medical examination with abnormal findings    -  Primary   Relevant Orders   Comprehensive metabolic panel with GFR   CBC with Differential/Platelet   Hemoglobin A1c   Lipid panel     Screening for deficiency anemia       Relevant Orders   CBC with Differential/Platelet     Screening for lipoid disorders  Relevant Orders   Lipid panel     Screening for endocrine, metabolic and immunity disorder       Relevant Orders   Comprehensive metabolic panel with GFR   Hemoglobin A1c        Modifiable risk factors discussed with patient. Anticipatory guidance according to age provided. The following topics were also discussed: Social Determinants of Health Smoking and cardiovascular/cancer risks associated with this Diet and nutrition Benefits of exercise Cancer screening and review of colonoscopy report from 2022 Vaccinations reviewed  and recommendations Cardiovascular risk assessment and need for blood work The 10-year ASCVD risk score (Arnett DK, et al., 2019) is: 3%   Values used to calculate the score:     Age: 53 years     Clincally relevant sex: Female     Is Non-Hispanic African American: Yes     Diabetic: No     Tobacco smoker: Yes     Systolic Blood Pressure: 122 mmHg     Is BP treated: Yes     HDL Cholesterol: 80.4 mg/dL     Total Cholesterol: 180 mg/dL  Mental health including depression and anxiety Fall and accident prevention  Patient Instructions  Health Maintenance, Female Adopting a healthy lifestyle and getting preventive care are important in promoting health and wellness. Ask your health care provider about: The right schedule for you to have regular tests and exams. Things you can do on your own to prevent diseases and keep yourself healthy. What should I know about diet, weight, and exercise? Eat a healthy diet  Eat a diet that includes plenty of vegetables, fruits, low-fat dairy products, and lean protein. Do not eat a lot of foods that are high in solid fats, added sugars, or sodium. Maintain a healthy weight Body mass index (BMI) is used to identify weight problems. It estimates body fat based on height and weight. Your health care provider can help determine your BMI and help you achieve or maintain a healthy weight. Get regular exercise Get regular exercise. This is one of the most important things you can do for your health. Most adults should: Exercise for at least 150 minutes each week. The exercise should increase your heart rate and make you sweat (moderate-intensity exercise). Do strengthening exercises at least twice a week. This is in addition to the moderate-intensity exercise. Spend less time sitting. Even light physical activity can be beneficial. Watch cholesterol and blood lipids Have your blood tested for lipids and cholesterol at 50 years of age, then have this test every  5 years. Have your cholesterol levels checked more often if: Your lipid or cholesterol levels are high. You are older than 50 years of age. You are at high risk for heart disease. What should I know about cancer screening? Depending on your health history and family history, you may need to have cancer screening at various ages. This may include screening for: Breast cancer. Cervical cancer. Colorectal cancer. Skin cancer. Lung cancer. What should I know about heart disease, diabetes, and high blood pressure? Blood pressure and heart disease High blood pressure causes heart disease and increases the risk of stroke. This is more likely to develop in people who have high blood pressure readings or are overweight. Have your blood pressure checked: Every 3-5 years if you are 44-49 years of age. Every year if you are 73 years old or older. Diabetes Have regular diabetes screenings. This checks your fasting blood sugar level. Have the screening done: Once every three years  after age 11 if you are at a normal weight and have a low risk for diabetes. More often and at a younger age if you are overweight or have a high risk for diabetes. What should I know about preventing infection? Hepatitis B If you have a higher risk for hepatitis B, you should be screened for this virus. Talk with your health care provider to find out if you are at risk for hepatitis B infection. Hepatitis C Testing is recommended for: Everyone born from [birth year] through 1967. Anyone with known risk factors for hepatitis C. Sexually transmitted infections (STIs) Get screened for STIs, including gonorrhea and chlamydia, if: You are sexually active and are younger than 50 years of age. You are older than 50 years of age and your health care provider tells you that you are at risk for this type of infection. Your sexual activity has changed since you were last screened, and you are at increased risk for chlamydia or gonorrhea.  Ask your health care provider if you are at risk. Ask your health care provider about whether you are at high risk for HIV. Your health care provider may recommend a prescription medicine to help prevent HIV infection. If you choose to take medicine to prevent HIV, you should first get tested for HIV. You should then be tested every 3 months for as long as you are taking the medicine. Pregnancy If you are about to stop having your period (premenopausal) and you may become pregnant, seek counseling before you get pregnant. Take 400 to 800 micrograms (mcg) of folic acid every day if you become pregnant. Ask for birth control (contraception) if you want to prevent pregnancy. Osteoporosis and menopause Osteoporosis is a disease in which the bones lose minerals and strength with aging. This can result in bone fractures. If you are 70 years old or older, or if you are at risk for osteoporosis and fractures, ask your health care provider if you should: Be screened for bone loss. Take a calcium  or vitamin D  supplement to lower your risk of fractures. Be given hormone replacement therapy (HRT) to treat symptoms of menopause. Follow these instructions at home: Alcohol use Do not drink alcohol if: Your health care provider tells you not to drink. You are pregnant, may be pregnant, or are planning to become pregnant. If you drink alcohol: Limit how much you have to: 0-1 drink a day. Know how much alcohol is in your drink. In the [place] one drink equals one 12 oz bottle of beer (355 mL), one 5 oz glass of wine (148 mL), or one 1 oz glass of hard liquor (44 mL). Lifestyle Do not use any products that contain nicotine or tobacco. These products include cigarettes, chewing tobacco, and vaping devices, such as e-cigarettes. If you need help quitting, ask your health care provider. Do not use street drugs. Do not share needles. Ask your health care provider for help if you need support or information about  quitting drugs. General instructions Schedule regular health, dental, and eye exams. Stay current with your vaccines. Tell your health care provider if: You often feel depressed. You have ever been abused or do not feel safe at home. Summary Adopting a healthy lifestyle and getting preventive care are important in promoting health and wellness. Follow your health care provider's instructions about healthy diet, exercising, and getting tested or screened for diseases. Follow your health care provider's instructions on monitoring your cholesterol and blood pressure. This information is not intended to  replace advice given to you by your health care provider. Make sure you discuss any questions you have with your health care provider. Document Revised: 08/09/2020 Document Reviewed: 08/09/2020 Elsevier Patient Education  2024 Else[employer] Inc[employer]     Emil Schaumann, MD  Primary [facility] at [facility]

## 2024-01-28 NOTE — Assessment & Plan Note (Signed)
 Cardiovascular and cancer risks associated with smoking discussed. Smoking cessation advice given.

## 2024-01-29 ENCOUNTER — Ambulatory Visit: Payer: Self-pay | Admitting: Emergency Medicine

## 2024-01-29 LAB — HEMOGLOBIN A1C: Hgb A1c MFr Bld: 5 % (ref 4.6–6.5)

## 2024-02-06 ENCOUNTER — Other Ambulatory Visit: Payer: Self-pay | Admitting: Diagnostic Neuroimaging

## 2024-02-06 ENCOUNTER — Other Ambulatory Visit

## 2024-02-06 DIAGNOSIS — Z006 Encounter for examination for normal comparison and control in clinical research program: Secondary | ICD-10-CM

## 2024-02-11 NOTE — Telephone Encounter (Signed)
 Last filled by patient on 01/09/24 Last office visit :  procedure visit 02/15/23 / OV - 10/ 7/24 Next office visit :  sent a mychart message to the patient to shedule an OV Per 01/08/23 note - continue rizatriptan  as needed for breakthrough migraine

## 2024-02-12 ENCOUNTER — Other Ambulatory Visit: Payer: Self-pay | Admitting: Emergency Medicine

## 2024-02-18 LAB — GENECONNECT MOLECULAR SCREEN: Genetic Analysis Overall Interpretation: NEGATIVE

## 2024-02-19 NOTE — Telephone Encounter (Signed)
 Can we redo a pa for zepbound 

## 2024-02-20 ENCOUNTER — Telehealth: Payer: Self-pay

## 2024-02-20 ENCOUNTER — Ambulatory Visit (INDEPENDENT_AMBULATORY_CARE_PROVIDER_SITE_OTHER): Admitting: Emergency Medicine

## 2024-02-20 ENCOUNTER — Other Ambulatory Visit (HOSPITAL_COMMUNITY): Payer: Self-pay

## 2024-02-20 ENCOUNTER — Encounter: Payer: Self-pay | Admitting: Emergency Medicine

## 2024-02-20 VITALS — BP 86/68 | HR 65 | Temp 98.3°F | Ht 63.0 in | Wt 166.0 lb

## 2024-02-20 DIAGNOSIS — G894 Chronic pain syndrome: Secondary | ICD-10-CM

## 2024-02-20 DIAGNOSIS — I10 Essential (primary) hypertension: Secondary | ICD-10-CM | POA: Diagnosis not present

## 2024-02-20 DIAGNOSIS — R42 Dizziness and giddiness: Secondary | ICD-10-CM | POA: Diagnosis not present

## 2024-02-20 DIAGNOSIS — I952 Hypotension due to drugs: Secondary | ICD-10-CM

## 2024-02-20 MED ORDER — VALSARTAN 80 MG PO TABS: 80.0000 mg | ORAL_TABLET | Freq: Every day | ORAL | 3 refills | Status: AC

## 2024-02-20 NOTE — Assessment & Plan Note (Signed)
 BP Readings from Last 3 Encounters:  02/20/24 (!) 86/68  01/28/24 122/74  12/24/23 104/72  Hypertension responding to better nutrition and exercise. Will need lower dose of medication or may not need any at all. Advised to monitor blood pressure readings at home daily for the next several weeks and contact the office if numbers persistently abnormal Advised to stop valsartan  HCT for several days If blood pressure needed, recommend to start at a lower valsartan  dose such as 80 mg daily.

## 2024-02-20 NOTE — Patient Instructions (Addendum)
 Stop blood pressure medication valsartan  HCT 160-12.5 mg but continue monitoring blood pressure readings at home daily for the next several weeks.  Contact the office if numbers persistently abnormal.  If medication needs to be restarted recommend lower dose of valsartan  80 mg daily.  New prescription sent to pharmacy of record today.  Hypotension As your heart beats, it forces blood through your body. This force is called blood pressure. If you have hypotension, you have low blood pressure.  When your blood pressure is too low, you may not get enough blood to your brain or other parts of your body. This may cause you to feel weak, light-headed, have a fast heartbeat, or even faint. Low blood pressure may be harmless, or it may cause serious problems. What are the causes? Blood loss. Not enough water in the body (dehydration). Heart problems. Hormone problems. Pregnancy. A very bad infection. Not having enough of certain nutrients. Very bad allergic reactions. Certain medicines. What increases the risk? Age. The risk increases as you get older. Conditions that affect the heart or the brain and spinal cord (central nervous system). What are the signs or symptoms? Feeling: Weak. Light-headed. Dizzy. Tired (fatigued). Blurred vision. Fast heartbeat. Fainting, in very bad cases. How is this treated? Changing your diet. This may involve drinking more water or including more salt (sodium) in your diet by eating high-salt foods. Taking medicines to raise your blood pressure. Changing how much you take (the dosage) of some of your medicines. Wearing compression stockings. These stockings help to prevent blood clots and reduce swelling in your legs. In some cases, you may need to go to the hospital to: Receive fluids through an IV tube. Receive donated blood through an IV tube (transfusion). Get treated for an infection or heart problems, if this applies. Be monitored while medicines that  you are taking wear off. Follow these instructions at home: Eating and drinking  Drink enough fluids to keep your pee (urine) pale yellow. Eat a healthy diet. Follow instructions from your doctor about what you can eat or drink. A healthy diet includes: Fresh fruits and vegetables. Whole grains. Low-fat (lean) meats. Low-fat dairy products. If told, include more salt in your diet. Do not add extra salt to your diet unless your doctor tells you to. Eat small meals often. Avoid standing up quickly after you eat. Medicines Take over-the-counter and prescription medicines only as told by your doctor. Follow instructions from your doctor about changing how much you take of your medicines, if this applies. Do not stop or change any of your medicines on your own. General instructions  Wear compression stockings as told by your doctor. Get up slowly from lying down or sitting. Avoid hot showers and a lot of heat as told by your doctor. Return to your normal activities when your doctor says that it is safe. Do not smoke or use any products that contain nicotine or tobacco. If you need help quitting, ask your doctor. Keep all follow-up visits. Contact a doctor if: You vomit. You have watery poop (diarrhea). You have a fever for more than 2-3 days. You feel more thirsty than normal. You feel weak and tired. Get help right away if: You have chest pain. You have a fast or uneven heartbeat. You lose feeling (have numbness) in any part of your body. You cannot move your arms or your legs. You have trouble talking. You get sweaty or feel light-headed. You faint. You have trouble breathing. You have trouble staying  awake. You feel mixed up (confused). These symptoms may be an emergency. Get help right away. Call 911. Do not wait to see if the symptoms will go away. Do not drive yourself to the hospital. Summary Hypotension is also called low blood pressure. It is when the force of blood  pumping through your body is too weak. Hypotension may be harmless, or it may cause serious problems. Treatment may include changing your diet and medicines, and wearing compression stockings. In very bad cases, you may need to go to the hospital. This information is not intended to replace advice given to you by your health care provider. Make sure you discuss any questions you have with your health care provider. Document Revised: 11/08/2020 Document Reviewed: 11/08/2020 Elsevier Patient Education  2024 [employer].

## 2024-02-20 NOTE — Progress Notes (Signed)
 Angela Farrell 48 y.o.   Chief Complaint  Patient presents with   Dizziness    In her left arm and left foot and she is also having abdominal pain as well     HISTORY OF PRESENT ILLNESS: This is a 48 y.o. female complaining of intermittent episodes of lightheadedness and dizziness for the last several weeks Occasional lower abdominal pain.  No now. At times notices numbness to left foot when starting to walk during exercise.  Symptoms get better after about 20 minutes of walking. Has noticed low blood pressure readings at home as well. No other complaints or medical concerns today. Wt Readings from Last 3 Encounters:  02/20/24 166 lb (75.3 kg)  01/28/24 168 lb (76.2 kg)  12/24/23 172 lb 3.2 oz (78.1 kg)     Dizziness Pertinent negatives include no abdominal pain, chest pain, chills, congestion, coughing, fever, nausea, rash, sore throat or vomiting.     Prior to Admission medications   Medication Sig Start Date End Date Taking? Authorizing Provider  Acetaminophen  (TYLENOL  8 HOUR PO) Take by mouth.   Yes [provider]  diclofenac  (VOLTAREN ) 75 MG EC tablet Take 1 tablet (75 mg total) by mouth 2 (two) times daily. 05/28/23  Yes Regal, [place] RAMAN, DPM  estradiol (VIVELLE-DOT) 0.075 MG/24HR APPLY 1 PATCH TRANSDERMALLY TWICE A WEEK   Yes [provider]  Magnesium 500 MG CAPS Take 500 mg by mouth 4 (four) times daily.   Yes [provider]  omega-3 acid ethyl esters (LOVAZA) 1 g capsule Take by mouth 2 (two) times daily.   Yes [provider]  Potassium Gluconate 595 MG CAPS Take 595 mg by mouth daily.   Yes [provider]  Rimegepant Sulfate (NURTEC) 75 MG TBDP Take 1 tablet (75 mg total) by mouth every other day. Call [phone] to schedule follow up for ongoing refills 12/19/23  Yes Penumalli, Eduard SAUNDERS, MD  rizatriptan  (MAXALT -MLT) 10 MG disintegrating tablet Take 1 tablet (10 mg total) by mouth as needed for migraine. Please call  [phone] to schedule an office visit for further refills 02/11/24  Yes Penumalli, Eduard SAUNDERS, MD  rosuvastatin  (CRESTOR ) 10 MG tablet TAKE 1 TABLET BY MOUTH EVERY DAY 10/18/23  Yes Sigmund Morera, Emil Schanz, MD  tirzepatide  (ZEPBOUND ) 7.5 MG/0.5ML Pen INJECT 7.5 MG SUBCUTANEOUSLY WEEKLY 02/12/24  Yes Mahad Newstrom Jose, MD  valACYclovir  (VALTREX ) 500 MG tablet Take 1 tablet (500 mg total) by mouth 2 (two) times daily. When having an HSV meningits flare increase to 1000mg  BID 12/24/23  Yes Fleeta Rothman, Jomarie SAILOR, MD  valsartan -hydrochlorothiazide  (DIOVAN -HCT) 160-12.5 MG tablet Take 1 tablet by mouth daily. 07/24/23  Yes Kimm Sider Jose, MD  vitamin A 8000 UNIT capsule Take 8,000 Units by mouth daily.   Yes [provider]  vitamin C (ASCORBIC ACID) 500 MG tablet Take 500 mg by mouth daily.   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 Darina   Vaginal spotting 06/04/2019   Vision abnormalities     Past Surgical History:  Procedure Laterality Date   BACK SURGERY  02/09/15   L5 - S1 diskectomy   FOOT SURGERY  s/p 2 procedures   LAPAROSCOPY FOR ECTOPIC PREGNANCY  1998   Fallopian tube  removed   MOUTH SURGERY     spinal injection  03/10/15   SPINE SURGERY     UTERINE ARTERY EMBOLIZATION      Social History   Socioeconomic History   Marital status: Married    Spouse name: Rolan   Number of children: Not on file   Years of education: Not on file   Highest education level: Bachelor's degree (e.g., BA, AB, BS)  Occupational History   Occupation: Chief Financial Officer: [employer]  Tobacco Use   Smoking status: Some Days    Types: Cigarettes   Smokeless tobacco: Never  Vaping Use   Vaping status: Never Used  Substance and Sexual Activity   Alcohol use: Yes    Alcohol/week: 1.0 standard drink of alcohol    Types: 1 Standard drinks or equivalent per week   Drug use: Yes    Types: Marijuana    Comment: per pt few times a year   Sexual activity: Yes    Partners: Male    Birth control/protection: Implant    Comment: sexually active w/ female husband  Other Topics Concern   Not on file  Social History Narrative   Drinks about 12-16oz of coffee a day, occasionally drinks a tea.   [employer]oks on animator as systems developer.    Social Drivers of Health   Financial Resource Strain: Low Risk  (01/25/2024)   Overall Financial Resource Strain (CARDIA)    Difficulty of Paying Living Expenses: Not hard at all  Food Insecurity: No Food Insecurity (01/25/2024)   Hunger Vital Sign    Worried About Running Out of Food in the Last Year: Never true    Ran Out of Food in the Last Year: Never true  Transportation Needs: No Transportation Needs (01/25/2024)   PRAPARE - Administrator, Civil Service (Medical): No    Lack of Transportation (Non-Medical): No  Physical Activity: Sufficiently Active (01/25/2024)   Exercise Vital Sign    Days of Exercise per Week: 5 days    Minutes of Exercise per Session: 40 min  Stress: No Stress Concern Present (01/25/2024)   [employer] of Occupational Health - Occupational Stress Questionnaire    Feeling of Stress: Not at all   Social Connections: Moderately Isolated (01/25/2024)   Social Connection and Isolation Panel    Frequency of Communication with Friends and Family: Once a week    Frequency of Social Gatherings with Friends and Family: Once a week    Attends Religious Services: Never    Database Administrator or Organizations: Yes    Attends Banker Meetings: 1 to 4 times per year    Marital Status: Married  Catering Manager Violence: Not on file    Family History  Problem Relation  Age of Onset   Migraines Father        cluster headache   Hyperlipidemia Father    Hypertension Father    Cancer Mother 45       Breast   Stroke Maternal Grandmother    Diabetes Paternal Grandmother    Diabetes Other        paternal relaitve   Seizures Other        maternal relative   Rheum arthritis Other        maternal relatives   Colon polyps Neg Hx    Esophageal cancer Neg Hx    Stomach cancer Neg Hx    Rectal cancer Neg Hx      Review of Systems  Constitutional: Negative.  Negative for chills and fever.  HENT: Negative.  Negative for congestion and sore throat.   Respiratory: Negative.  Negative for cough and shortness of breath.   Cardiovascular: Negative.  Negative for chest pain and palpitations.  Gastrointestinal:  Negative for abdominal pain, diarrhea, nausea and vomiting.  Genitourinary: Negative.  Negative for dysuria and hematuria.  Skin: Negative.  Negative for rash.  Neurological:  Positive for dizziness.  All other systems reviewed and are negative.   Vitals:   02/20/24 1552  BP: (!) 86/68  Pulse: 65  Temp: 98.3 F (36.8 C)  SpO2: 97%    Physical Exam Vitals reviewed.  Constitutional:      Appearance: Normal appearance.  HENT:     Head: Normocephalic.     Mouth/Throat:     Mouth: Mucous membranes are moist.     Pharynx: Oropharynx is clear.  Eyes:     Extraocular Movements: Extraocular movements intact.     Conjunctiva/sclera: Conjunctivae normal.      Pupils: Pupils are equal, round, and reactive to light.  Cardiovascular:     Rate and Rhythm: Normal rate and regular rhythm.     Pulses: Normal pulses.     Heart sounds: Normal heart sounds.  Pulmonary:     Effort: Pulmonary effort is normal.     Breath sounds: Normal breath sounds.  Musculoskeletal:     Cervical back: No tenderness.     Right lower leg: No edema.     Left lower leg: No edema.  Lymphadenopathy:     Cervical: No cervical adenopathy.  Skin:    General: Skin is warm and dry.     Capillary Refill: Capillary refill takes less than 2 seconds.  Neurological:     General: No focal deficit present.     Mental Status: She is alert and oriented to person, place, and time.  Psychiatric:        Mood and Affect: Mood normal.        Behavior: Behavior normal.      ASSESSMENT & PLAN: Problem List Items Addressed This Visit       Cardiovascular and Mediastinum   Essential hypertension   Responding well to better nutrition and exercise Recommend to reduce dose of valsartan  May be able to discontinue completely Advised to monitor blood pressure readings at home daily for the next several weeks and contact the office if numbers persistently abnormal      Relevant Medications   valsartan  (DIOVAN ) 80 MG tablet   Hypotension due to drugs - Primary   BP Readings from Last 3 Encounters:  02/20/24 (!) 86/68  01/28/24 122/74  12/24/23 104/72  Hypertension responding to better nutrition and exercise. Will need lower dose of medication or may not  need any at all. Advised to monitor blood pressure readings at home daily for the next several weeks and contact the office if numbers persistently abnormal Advised to stop valsartan  HCT for several days If blood pressure needed, recommend to start at a lower valsartan  dose such as 80 mg daily.       Relevant Medications   valsartan  (DIOVAN ) 80 MG tablet     Other   Chronic pain syndrome   Stable and well-controlled Pain  management discussed Has been able to see orthopedic, sports medicine, and rheumatologist in the last couple of months      Dizziness   Dizziness a result of hypotensive episodes Blood pressure medication adjusted Clinically stable.  No red flag signs or symptoms. Recent blood work from October 2025 within normal limits      Patient Instructions  Stop blood pressure medication valsartan  HCT 160-12.5 mg but continue monitoring blood pressure readings at home daily for the next several weeks.  Contact the office if numbers persistently abnormal.  If medication needs to be restarted recommend lower dose of valsartan  80 mg daily.  New prescription sent to pharmacy of record today.  Hypotension As your heart beats, it forces blood through your body. This force is called blood pressure. If you have hypotension, you have low blood pressure.  When your blood pressure is too low, you may not get enough blood to your brain or other parts of your body. This may cause you to feel weak, light-headed, have a fast heartbeat, or even faint. Low blood pressure may be harmless, or it may cause serious problems. What are the causes? Blood loss. Not enough water in the body (dehydration). Heart problems. Hormone problems. Pregnancy. A very bad infection. Not having enough of certain nutrients. Very bad allergic reactions. Certain medicines. What increases the risk? Age. The risk increases as you get older. Conditions that affect the heart or the brain and spinal cord (central nervous system). What are the signs or symptoms? Feeling: Weak. Light-headed. Dizzy. Tired (fatigued). Blurred vision. Fast heartbeat. Fainting, in very bad cases. How is this treated? Changing your diet. This may involve drinking more water or including more salt (sodium) in your diet by eating high-salt foods. Taking medicines to raise your blood pressure. Changing how much you take (the dosage) of some of your  medicines. Wearing compression stockings. These stockings help to prevent blood clots and reduce swelling in your legs. In some cases, you may need to go to the hospital to: Receive fluids through an IV tube. Receive donated blood through an IV tube (transfusion). Get treated for an infection or heart problems, if this applies. Be monitored while medicines that you are taking wear off. Follow these instructions at home: Eating and drinking  Drink enough fluids to keep your pee (urine) pale yellow. Eat a healthy diet. Follow instructions from your doctor about what you can eat or drink. A healthy diet includes: Fresh fruits and vegetables. Whole grains. Low-fat (lean) meats. Low-fat dairy products. If told, include more salt in your diet. Do not add extra salt to your diet unless your doctor tells you to. Eat small meals often. Avoid standing up quickly after you eat. Medicines Take over-the-counter and prescription medicines only as told by your doctor. Follow instructions from your doctor about changing how much you take of your medicines, if this applies. Do not stop or change any of your medicines on your own. General instructions  Wear compression stockings as told by your  doctor. Get up slowly from lying down or sitting. Avoid hot showers and a lot of heat as told by your doctor. Return to your normal activities when your doctor says that it is safe. Do not smoke or use any products that contain nicotine or tobacco. If you need help quitting, ask your doctor. Keep all follow-up visits. Contact a doctor if: You vomit. You have watery poop (diarrhea). You have a fever for more than 2-3 days. You feel more thirsty than normal. You feel weak and tired. Get help right away if: You have chest pain. You have a fast or uneven heartbeat. You lose feeling (have numbness) in any part of your body. You cannot move your arms or your legs. You have trouble talking. You get sweaty or  feel light-headed. You faint. You have trouble breathing. You have trouble staying awake. You feel mixed up (confused). These symptoms may be an emergency. Get help right away. Call 911. Do not wait to see if the symptoms will go away. Do not drive yourself to the hospital. Summary Hypotension is also called low blood pressure. It is when the force of blood pumping through your body is too weak. Hypotension may be harmless, or it may cause serious problems. Treatment may include changing your diet and medicines, and wearing compression stockings. In very bad cases, you may need to go to the hospital. This information is not intended to replace advice given to you by your health care provider. Make sure you discuss any questions you have with your health care provider. Document Revised: 11/08/2020 Document Reviewed: 11/08/2020 Elsevier Patient Education  2024 [employer]     Emil Schaumann, MD [facility]

## 2024-02-20 NOTE — Telephone Encounter (Signed)
 Pharmacy Patient Advocate Encounter  Received notification from [employer] that Prior Authorization for Zepbound  7.5mg /0.12ml has been APPROVED from 02/20/24 to 02/19/25   PA #/Case ID/Reference #: [id number]

## 2024-02-20 NOTE — Assessment & Plan Note (Addendum)
 Dizziness a result of hypotensive episodes Blood pressure medication adjusted Clinically stable.  No red flag signs or symptoms. Recent blood work from October 2025 within normal limits

## 2024-02-20 NOTE — Assessment & Plan Note (Signed)
 Stable and well-controlled Pain management discussed Has been able to see orthopedic, sports medicine, and rheumatologist in the last couple of months.

## 2024-02-20 NOTE — Telephone Encounter (Signed)
 Pharmacy Patient Advocate Encounter   Received notification from Patient Advice Request messages that prior authorization for Zepbound  7.5mg /0.19ml is required/requested.   Insurance verification completed.   The patient is insured through [facility].   Per test claim: PA required; PA submitted to above mentioned insurance via Latent Key/confirmation #/[id number] Status is pending

## 2024-02-20 NOTE — Assessment & Plan Note (Signed)
 Responding well to better nutrition and exercise Recommend to reduce dose of valsartan  May be able to discontinue completely Advised to monitor blood pressure readings at home daily for the next several weeks and contact the office if numbers persistently abnormal

## 2024-03-10 ENCOUNTER — Other Ambulatory Visit: Payer: Self-pay | Admitting: Diagnostic Neuroimaging

## 2024-03-10 NOTE — Telephone Encounter (Signed)
 Patient refused to make an appointment for medication refills at this time. She will call to make an appointment when she needs medication, and has been off meds for a while.

## 2024-03-26 ENCOUNTER — Other Ambulatory Visit: Payer: Self-pay | Admitting: Infectious Disease

## 2024-03-26 DIAGNOSIS — B003 Herpesviral meningitis: Secondary | ICD-10-CM
